# Patient Record
Sex: Male | Born: 1950 | Race: White | Hispanic: No | Smoking: Current some day smoker
Health system: Southern US, Community
[De-identification: ages and names within clinical notes are randomized; demographics above are authoritative.]

## PROBLEM LIST (undated history)

## (undated) DIAGNOSIS — E1165 Type 2 diabetes mellitus with hyperglycemia: Secondary | ICD-10-CM

## (undated) DIAGNOSIS — N183 Chronic kidney disease, stage 3 unspecified: Secondary | ICD-10-CM

## (undated) DIAGNOSIS — I350 Nonrheumatic aortic (valve) stenosis: Secondary | ICD-10-CM

## (undated) DIAGNOSIS — I10 Essential (primary) hypertension: Secondary | ICD-10-CM

## (undated) DIAGNOSIS — I509 Heart failure, unspecified: Secondary | ICD-10-CM

## (undated) DIAGNOSIS — I739 Peripheral vascular disease, unspecified: Secondary | ICD-10-CM

## (undated) DIAGNOSIS — IMO0002 Reserved for concepts with insufficient information to code with codable children: Secondary | ICD-10-CM

## (undated) DIAGNOSIS — I4891 Unspecified atrial fibrillation: Secondary | ICD-10-CM

## (undated) DIAGNOSIS — E119 Type 2 diabetes mellitus without complications: Secondary | ICD-10-CM

## (undated) DIAGNOSIS — G4730 Sleep apnea, unspecified: Secondary | ICD-10-CM

## (undated) DIAGNOSIS — K635 Polyp of colon: Secondary | ICD-10-CM

## (undated) HISTORY — DX: Morbid (severe) obesity due to excess calories: E66.01

## (undated) HISTORY — DX: Type 2 diabetes mellitus without complications: E11.9

## (undated) HISTORY — DX: Chronic kidney disease, stage 3 unspecified: N18.30

## (undated) HISTORY — DX: Polyp of colon: K63.5

## (undated) HISTORY — DX: Chronic kidney disease, stage 3 (moderate): N18.3

## (undated) HISTORY — DX: Essential (primary) hypertension: I10

## (undated) HISTORY — DX: Type 2 diabetes mellitus with hyperglycemia: E11.65

## (undated) HISTORY — DX: Reserved for concepts with insufficient information to code with codable children: IMO0002

## (undated) HISTORY — DX: Sleep apnea, unspecified: G47.30

## (undated) HISTORY — DX: Peripheral vascular disease, unspecified: I73.9

---

## 2010-08-02 ENCOUNTER — Emergency Department (HOSPITAL_COMMUNITY): Admission: EM | Admit: 2010-08-02 | Discharge: 2010-08-02 | Payer: Self-pay | Admitting: Emergency Medicine

## 2010-12-05 LAB — DIFFERENTIAL
Basophils Absolute: 0 10*3/uL (ref 0.0–0.1)
Basophils Relative: 0 % (ref 0–1)
Monocytes Relative: 10 % (ref 3–12)
Neutro Abs: 6.7 10*3/uL (ref 1.7–7.7)
Neutrophils Relative %: 62 % (ref 43–77)

## 2010-12-05 LAB — CBC
HCT: 49.2 % (ref 39.0–52.0)
MCHC: 33.7 g/dL (ref 30.0–36.0)
RDW: 13.3 % (ref 11.5–15.5)

## 2010-12-05 LAB — COMPREHENSIVE METABOLIC PANEL
Alkaline Phosphatase: 49 U/L (ref 39–117)
BUN: 8 mg/dL (ref 6–23)
Glucose, Bld: 92 mg/dL (ref 70–99)
Potassium: 4.2 mEq/L (ref 3.5–5.1)
Total Protein: 7.9 g/dL (ref 6.0–8.3)

## 2010-12-05 LAB — CK TOTAL AND CKMB (NOT AT ARMC)
Relative Index: INVALID (ref 0.0–2.5)
Total CK: 94 U/L (ref 7–232)

## 2010-12-05 LAB — BRAIN NATRIURETIC PEPTIDE: Pro B Natriuretic peptide (BNP): 54 pg/mL (ref 0.0–100.0)

## 2010-12-05 LAB — TROPONIN I: Troponin I: 0.01 ng/mL (ref 0.00–0.06)

## 2010-12-05 LAB — PROTIME-INR
INR: 0.88 (ref 0.00–1.49)
Prothrombin Time: 12.1 seconds (ref 11.6–15.2)

## 2013-01-08 ENCOUNTER — Ambulatory Visit (INDEPENDENT_AMBULATORY_CARE_PROVIDER_SITE_OTHER): Payer: BC Managed Care – PPO | Admitting: Family Medicine

## 2013-01-08 ENCOUNTER — Other Ambulatory Visit: Payer: Self-pay | Admitting: Family Medicine

## 2013-01-08 ENCOUNTER — Encounter: Payer: Self-pay | Admitting: Family Medicine

## 2013-01-08 VITALS — BP 136/78 | HR 86 | Temp 98.1°F | Resp 22 | Ht 73.5 in | Wt 352.0 lb

## 2013-01-08 DIAGNOSIS — G4710 Hypersomnia, unspecified: Secondary | ICD-10-CM

## 2013-01-08 DIAGNOSIS — R0989 Other specified symptoms and signs involving the circulatory and respiratory systems: Secondary | ICD-10-CM

## 2013-01-08 DIAGNOSIS — R609 Edema, unspecified: Secondary | ICD-10-CM

## 2013-01-08 LAB — CBC WITH DIFFERENTIAL/PLATELET
Eosinophils Absolute: 0.1 10*3/uL (ref 0.0–0.7)
Eosinophils Relative: 2 % (ref 0–5)
Hemoglobin: 16 g/dL (ref 13.0–17.0)
Lymphocytes Relative: 22 % (ref 12–46)
Lymphs Abs: 2 10*3/uL (ref 0.7–4.0)
MCH: 31.7 pg (ref 26.0–34.0)
MCV: 93.3 fL (ref 78.0–100.0)
Monocytes Relative: 8 % (ref 3–12)
RBC: 5.05 MIL/uL (ref 4.22–5.81)

## 2013-01-08 LAB — BASIC METABOLIC PANEL
Calcium: 9.3 mg/dL (ref 8.4–10.5)
Chloride: 99 mEq/L (ref 96–112)
Creat: 1.01 mg/dL (ref 0.50–1.35)

## 2013-01-08 LAB — HEPATIC FUNCTION PANEL
Bilirubin, Direct: 0.1 mg/dL (ref 0.0–0.3)
Indirect Bilirubin: 0.2 mg/dL (ref 0.0–0.9)
Total Bilirubin: 0.3 mg/dL (ref 0.3–1.2)

## 2013-01-08 LAB — TSH: TSH: 2.712 u[IU]/mL (ref 0.350–4.500)

## 2013-01-08 MED ORDER — FUROSEMIDE 40 MG PO TABS: 40.0000 mg | ORAL_TABLET | Freq: Every day | ORAL | Status: DC

## 2013-01-08 MED ORDER — POTASSIUM CHLORIDE CRYS ER 20 MEQ PO TBCR: 20.0000 meq | EXTENDED_RELEASE_TABLET | Freq: Every day | ORAL | Status: DC
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
## 2013-01-09 LAB — OTHER SOLSTAS TEST
ALT: 16 U/L (ref 0–53)
AST: 15 U/L (ref 0–37)
Alkaline Phosphatase: 50 U/L (ref 39–117)
Anti Nuclear Antibody(ANA): NEGATIVE
Basophils Absolute: 0 10*3/uL (ref 0.0–0.1)
Basophils Relative: 0 % (ref 0–1)
Creat: 0.91 mg/dL (ref 0.50–1.35)
Eosinophils Absolute: 0.2 10*3/uL (ref 0.0–0.7)
HCT: 47.5 % (ref 39.0–52.0)
MCH: 32.1 pg (ref 26.0–34.0)
MCHC: 33.9 g/dL (ref 30.0–36.0)
Monocytes Absolute: 0.7 10*3/uL (ref 0.1–1.0)
Neutro Abs: 6.2 10*3/uL (ref 1.7–7.7)
Potassium: 4.3 mEq/L (ref 3.5–5.3)
RDW: 14.3 % (ref 11.5–15.5)
Rhuematoid fact SerPl-aCnc: <10 IU/mL (ref <=14)
Total Bilirubin: 0.3 mg/dL (ref 0.3–1.2)
Total Protein: 6.7 g/dL (ref 6.0–8.3)
Uric Acid, Serum: 4.2 mg/dL (ref 4.0–7.8)

## 2013-01-12 ENCOUNTER — Encounter: Payer: Self-pay | Admitting: Family Medicine

## 2013-01-12 LAB — HEMOGLOBIN A1C: Mean Plasma Glucose: 189 mg/dL — ABNORMAL HIGH (ref <117)

## 2013-01-12 MED ORDER — SITAGLIPTIN PHOS-METFORMIN HCL 50-500 MG PO TABS: 1.0000 | ORAL_TABLET | Freq: Two times a day (BID) | ORAL | Status: DC

## 2013-01-12 NOTE — Addendum Note (Signed)
Addended by: Lynnea Ferrier T on: 01/12/2013 01:11 PM   Modules accepted: Orders

## 2013-01-14 ENCOUNTER — Ambulatory Visit (HOSPITAL_COMMUNITY)
Admission: RE | Admit: 2013-01-14 | Discharge: 2013-01-14 | Disposition: A | Discharge status: Home / Self Care | Payer: BC Managed Care – PPO | Source: Ambulatory Visit | Attending: Family Medicine | Admitting: Family Medicine

## 2013-01-14 DIAGNOSIS — R0602 Shortness of breath: Secondary | ICD-10-CM | POA: Insufficient documentation

## 2013-01-14 MED ORDER — ALBUTEROL SULFATE (5 MG/ML) 0.5% IN NEBU
2.5000 mg | INHALATION_SOLUTION | Freq: Once | RESPIRATORY_TRACT | Status: AC
  Administered 2013-01-14: 2.5 mg via RESPIRATORY_TRACT

## 2013-01-20 NOTE — Procedures (Signed)
NAMERIGGINS, CISEK                ACCOUNT NO.:  [id number]  MEDICAL RECORD NO.:  [id number]  LOCATION:  RESP                          FACILITY:  [facility]  PHYSICIAN:  Delyle Weider L. Juanetta Gosling, M.D.DATE OF BIRTH:  [birth year]  DATE OF PROCEDURE:  01/14/2013 DATE OF DISCHARGE:  01/14/2013                           PULMONARY FUNCTION TEST   REASON FOR PULMONARY FUNCTION TESTING:  Shortness of breath.  RESULTS: 1. Spirometry shows a moderate ventilatory defect without definite     airflow obstruction. 2. Lung volumes show air trapping. 3. Diffusing capacity is normal. 4. Airway resistance is slightly elevated, confirming the presence of     airflow obstruction. 5. There is no significant bronchodilator improvement. 6. Noting the patient's height and weight, some of the problems may be     related to body habitus.     Deiondre Harrower L. Juanetta Gosling, M.D.     [facility]/MEDQ  D:  01/19/2013  T:  01/20/2013  Job:  [id number]

## 2013-01-29 LAB — PULMONARY FUNCTION TEST

## 2013-02-18 ENCOUNTER — Ambulatory Visit (HOSPITAL_BASED_OUTPATIENT_CLINIC_OR_DEPARTMENT_OTHER): Payer: BC Managed Care – PPO | Attending: Cardiology | Admitting: Radiology

## 2013-02-18 VITALS — Ht 73.0 in | Wt 327.0 lb

## 2013-02-18 DIAGNOSIS — R0609 Other forms of dyspnea: Secondary | ICD-10-CM | POA: Insufficient documentation

## 2013-02-18 DIAGNOSIS — E119 Type 2 diabetes mellitus without complications: Secondary | ICD-10-CM | POA: Insufficient documentation

## 2013-02-18 DIAGNOSIS — R0989 Other specified symptoms and signs involving the circulatory and respiratory systems: Secondary | ICD-10-CM | POA: Insufficient documentation

## 2013-02-18 DIAGNOSIS — G4733 Obstructive sleep apnea (adult) (pediatric): Secondary | ICD-10-CM | POA: Insufficient documentation

## 2013-02-24 ENCOUNTER — Institutional Professional Consult (permissible substitution): Payer: BC Managed Care – HMO | Admitting: Pulmonary Disease

## 2013-03-02 ENCOUNTER — Encounter: Payer: Self-pay | Admitting: Family Medicine

## 2013-03-02 DIAGNOSIS — G4730 Sleep apnea, unspecified: Secondary | ICD-10-CM | POA: Insufficient documentation

## 2013-03-02 DIAGNOSIS — E1165 Type 2 diabetes mellitus with hyperglycemia: Secondary | ICD-10-CM | POA: Insufficient documentation

## 2013-03-02 DIAGNOSIS — G4733 Obstructive sleep apnea (adult) (pediatric): Secondary | ICD-10-CM

## 2013-03-02 DIAGNOSIS — E119 Type 2 diabetes mellitus without complications: Secondary | ICD-10-CM | POA: Insufficient documentation

## 2013-03-02 NOTE — Procedures (Signed)
NAMELUIGI, STUCKEY                ACCOUNT NO.:  [id number]  MEDICAL RECORD NO.:  [id number]          PATIENT TYPE:  OUT  LOCATION:  SLEEP CENTER                 FACILITY[facility]  [facility]  PHYSICIAN:  Barbaraann Share, MD,FCCPDATE OF BIRTH:  11-21[birth year]1950  DATE OF STUDY:  02/18/2013                           NOCTURNAL POLYSOMNOGRAM  REFERRING PHYSICIAN:  Pamella Pert, MD  REFERRING PHYSICIAN:  Pamella Pert, MD  LOCATION:  Sleep Lab.  INDICATION FOR STUDY:  Hypersomnia with sleep apnea.  EPWORTH SLEEPINESS SCORE:  20.  SLEEP ARCHITECTURE:  The patient had a total sleep time of 331 minutes with decreased REM noted and no slow wave sleep.  Sleep onset latency was normal, and REM onset was delayed until the titration portion of the study.  Sleep efficiency was 80% during the diagnostic portion study and 85% during the titration portion.  RESPIRATORY DATA:  The patient was found to have 201 obstructive events in the first 123 minutes of sleep, giving him an apnea/hypopnea index of 98 events per hour during the diagnostic portion of the study.  The events occur primarily in the supine position and there was loud snoring noted throughout.  For split night protocol, he was then fitted with a medium ResMed Mirage Quattro full-face mask, and titrated up to 16 cm of water pressure.  At this level, he did have some mild breakthrough events, but it should be noted that he did not achieve supine sleep on the final setting.  OXYGEN DATA:  There was O2 desaturation as low as 68% during the diagnostic portion of the study.  Oxygen was added at 2 L per Sleep Center protocol.  CARDIAC DATA:  Occasional PAC noted, but no clinically significant arrhythmias were seen.  MOVEMENT/PARASOMNIA:  The patient had small numbers of limb movements without significant sleep disruption.  There were no abnormal behaviors seen.  IMPRESSION/RECOMMENDATION: 1. Very severe obstructive sleep apnea/hypopnea  syndrome, with an AHI     of 98 events per hour and oxygen desaturation as low as 68% during     the diagnostic portion of the study.  The patient was then fitted     with a medium ResMed Mirage Quattro full-face mask, and titrated up     to 16 cm of water pressure.  It should be noted the patient did not     achieve supine sleep on the final setting, and did have some     breakthrough events in the nonsupine position.  Clinical     correlation is suggested.  The patient may need fine tuning of his     optimal pressure as an outpatient on an auto titrating device.  He     should also be encouraged to work aggressively on weight loss. 2. Severe desaturation as low as 68%, triggering the Sleep Center     protocol for starting 2 L of nasal oxygen.  The patient will need     overnight oximetry on his optimal CPAP pressure to reassess his     need for supplemental oxygen. 3. Rare premature atrial contraction noted, but no clinically     significant arrhythmias were seen.  Barbaraann Share, MD,FCCP Diplomate, American Board of Sleep Medicine    [facility]/MEDQ  D:  03/02/2013 08:01:45  T:  03/02/2013 08:35:57  Job:  [id number]

## 2013-03-03 ENCOUNTER — Encounter: Payer: Self-pay | Admitting: Internal Medicine

## 2013-03-03 ENCOUNTER — Ambulatory Visit (INDEPENDENT_AMBULATORY_CARE_PROVIDER_SITE_OTHER): Payer: BC Managed Care – PPO | Admitting: Internal Medicine

## 2013-03-03 ENCOUNTER — Ambulatory Visit (INDEPENDENT_AMBULATORY_CARE_PROVIDER_SITE_OTHER)
Admission: RE | Admit: 2013-03-03 | Discharge: 2013-03-03 | Disposition: A | Discharge status: Home / Self Care | Payer: BC Managed Care – PPO | Source: Ambulatory Visit | Attending: Internal Medicine | Admitting: Internal Medicine

## 2013-03-03 VITALS — BP 112/58 | HR 80 | Temp 98.0°F | Ht 73.0 in | Wt 344.0 lb

## 2013-03-03 DIAGNOSIS — I10 Essential (primary) hypertension: Secondary | ICD-10-CM

## 2013-03-03 DIAGNOSIS — F17200 Nicotine dependence, unspecified, uncomplicated: Secondary | ICD-10-CM

## 2013-03-03 DIAGNOSIS — J449 Chronic obstructive pulmonary disease, unspecified: Secondary | ICD-10-CM

## 2013-03-03 MED ORDER — TELMISARTAN-HCTZ 80-12.5 MG PO TABS: ORAL_TABLET | ORAL | Status: DC

## 2013-03-03 NOTE — Progress Notes (Signed)
  Subjective:    Patient ID: Steven Henry, male    DOB: 06/27/1951, MRN: [id number]  HPI   69 yowm active smoker referred by Dr Nadara Eaton for eval of sob and cough  03/03/2013 1st pulmonary eval cc doe x several years x indolent onset to point where problem walking across a parking lot better p rx with "a pill" by Dr Charleston Poot and now more limited by calf pain than sob but now bothered by cough on ACEi since 12/2012 indolent x 2 weeks, dry, day > night  No obvious daytime variabilty or assoc cp or chest tightness, subjective wheeze overt sinus or hb symptoms. No unusual exp hx or h/o childhood pna/ asthma or knowledge of premature birth.   Sleeping ok without nocturnal  or early am exacerbation  of respiratory  c/o's or need for noct saba. Also denies any obvious fluctuation of symptoms with weather or environmental changes or other aggravating or alleviating factors except as outlined above     Review of Systems  Constitutional: Negative for fever, chills, activity change, appetite change and unexpected weight change.  HENT: Positive for sneezing. Negative for congestion, sore throat, rhinorrhea, trouble swallowing, dental problem, voice change and postnasal drip.   Eyes: Negative for visual disturbance.  Respiratory: Positive for cough and shortness of breath. Negative for choking.   Cardiovascular: Negative for chest pain and leg swelling.  Gastrointestinal: Negative for nausea, vomiting and abdominal pain.  Genitourinary: Negative for difficulty urinating.  Musculoskeletal: Positive for arthralgias.  Skin: Negative for rash.  Psychiatric/Behavioral: Negative for behavioral problems and confusion.       Objective:   Physical Exam  Wt Readings from Last 3 Encounters:  03/03/13 344 lb (156.037 kg)  02/18/13 327 lb (148.326 kg)  01/08/13 352 lb (159.666 kg)     HEENT mild turbinate edema.  Oropharynx no thrush or excess pnd or cobblestoning.  No JVD or cervical adenopathy. Mild accessory  muscle hypertrophy. Trachea midline, nl thryroid. Chest was hyperinflated by percussion with diminished breath sounds and moderate increased exp time without wheeze. Hoover sign positive at mid inspiration. Regular rate and rhythm without murmur gallop or rub or increase P2 or edema.  Abd: no hsm, nl excursion. Ext warm without cyanosis or clubbing.     cxr 03/03/13  *RADIOLOGY REPORT*  Clinical Data: Cough. Shortness of breath. Chest pain. Smoker.  Hypertension.  CHEST - 2 VIEW  Comparison: 08/02/2010 the  Findings: Heart size is normal. Central peribronchial thickening  and interstitial prominence is stable. No evidence of acute  infiltrate or pulmonary edema. No evidence of pleural effusion.  No mass or lymphadenopathy identified.  IMPRESSION:  Stable exam. No active disease.       Assessment & Plan:

## 2013-03-03 NOTE — Patient Instructions (Addendum)
Stop lisinopril (zestril, prinivil) due to cough   Start micardis 80/12.5  One half daily and let your doctor know this when you get your blood work called in  The key is to stop smoking completely before smoking completely stops you - it's not too late!  Please remember to go to   x-ray department downstairs for your tests - we will call you with the results when they are available.    Please schedule a follow up office visit in 6 weeks, call sooner if needed with pft's

## 2013-03-04 ENCOUNTER — Telehealth: Payer: Self-pay | Admitting: Internal Medicine

## 2013-03-04 NOTE — Telephone Encounter (Signed)
I spoke with patient about results and he verbalized understanding and had no questions 

## 2013-03-04 NOTE — Telephone Encounter (Signed)
Notes Recorded by Nyoka Cowden, MD on 03/03/2013 at 5:22 PM Call pt: Reviewed cxr and no acute change so no change in recommendations made at ov ----  lmomtcb x1

## 2013-03-04 NOTE — Telephone Encounter (Signed)
Returning call can be reached at [phone].Steven Henry

## 2013-03-05 DIAGNOSIS — F17210 Nicotine dependence, cigarettes, uncomplicated: Secondary | ICD-10-CM | POA: Insufficient documentation

## 2013-03-05 DIAGNOSIS — J449 Chronic obstructive pulmonary disease, unspecified: Secondary | ICD-10-CM | POA: Insufficient documentation

## 2013-03-05 DIAGNOSIS — F17200 Nicotine dependence, unspecified, uncomplicated: Secondary | ICD-10-CM | POA: Insufficient documentation

## 2013-03-05 DIAGNOSIS — I10 Essential (primary) hypertension: Secondary | ICD-10-CM | POA: Insufficient documentation

## 2013-03-05 DIAGNOSIS — J441 Chronic obstructive pulmonary disease with (acute) exacerbation: Secondary | ICD-10-CM | POA: Insufficient documentation

## 2013-03-05 NOTE — Assessment & Plan Note (Addendum)
ACE inhibitors are problematic in  pts with airway complaints because  even experienced pulmonologists can't always distinguish ace effects from copd/asthma.  By themselves they don't actually cause a problem, much like oxygen can't by itself start a fire, but they certainly serve as a powerful catalyst or enhancer for any "fire"  or inflammatory process in the upper airway, be it caused by an ET  tube or more commonly reflux (especially in the obese or pts with known GERD or who are on biphoshonates).    In the era of ARB near equivalency (at least  in short term) until we have a better handle on the reversibility of the airway problem, it just makes sense to avoid ACEI  entirely in the short run and then decide later, having established a level of airway control using a reasonable limited regimen, whether to add back ace but even then being very careful to observe the pt for worsening airway control and number of meds used/ needed to control symptoms.

## 2013-03-05 NOTE — Assessment & Plan Note (Signed)

## 2013-03-05 NOTE — Assessment & Plan Note (Signed)
Relatively mild but still smoking(discussed separately), needs pft's and trial off acei to see if his one resp complaint at this point, cough, improves  No need for copd meds at this point

## 2013-03-05 NOTE — Assessment & Plan Note (Addendum)
ACE inhibitors are problematic in  pts with airway complaints because  even experienced pulmonologists can't always distinguish ace effects from copd/asthma.  By themselves they don't actually cause a problem, much like oxygen can't by itself start a fire, but they certainly serve as a powerful catalyst or enhancer for any "fire"  or inflammatory process in the upper airway, be it caused by an ET  tube or more commonly reflux (especially in the obese or pts with known GERD or who are on biphoshonates).    In the era of ARB near equivalency (at least in short run) until we have a better handle on the reversibility of the airway problem, it just makes sense to avoid ACEI  entirely in the short run and then decide later, having established a level of airway control using a reasonable limited regimen, whether to add back ace but even then being very careful to observe the pt for worsening airway control and number of meds used/ needed to control symptoms. ]  For now try micardis 80/12.5 one half daily x 6 weeks then regroup with pfts

## 2013-03-13 ENCOUNTER — Encounter: Payer: Self-pay | Admitting: Family Medicine

## 2013-03-13 ENCOUNTER — Ambulatory Visit (INDEPENDENT_AMBULATORY_CARE_PROVIDER_SITE_OTHER): Payer: BC Managed Care – PPO | Admitting: Family Medicine

## 2013-03-13 VITALS — BP 100/64 | HR 74 | Temp 98.3°F | Resp 20 | Wt 336.0 lb

## 2013-03-13 DIAGNOSIS — G4733 Obstructive sleep apnea (adult) (pediatric): Secondary | ICD-10-CM

## 2013-03-13 DIAGNOSIS — IMO0001 Reserved for inherently not codable concepts without codable children: Secondary | ICD-10-CM

## 2013-03-13 DIAGNOSIS — F17200 Nicotine dependence, unspecified, uncomplicated: Secondary | ICD-10-CM

## 2013-03-13 MED ORDER — METFORMIN HCL 1000 MG PO TABS: 1000.0000 mg | ORAL_TABLET | Freq: Two times a day (BID) | ORAL | Status: DC
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
## 2013-04-20 ENCOUNTER — Ambulatory Visit (INDEPENDENT_AMBULATORY_CARE_PROVIDER_SITE_OTHER): Payer: BC Managed Care – PPO | Admitting: Internal Medicine

## 2013-04-20 ENCOUNTER — Encounter: Payer: Self-pay | Admitting: Internal Medicine

## 2013-04-20 VITALS — BP 112/70 | HR 65 | Temp 97.6°F | Ht 72.25 in | Wt 342.0 lb

## 2013-04-20 DIAGNOSIS — J449 Chronic obstructive pulmonary disease, unspecified: Secondary | ICD-10-CM

## 2013-04-20 DIAGNOSIS — F17200 Nicotine dependence, unspecified, uncomplicated: Secondary | ICD-10-CM

## 2013-04-20 DIAGNOSIS — I10 Essential (primary) hypertension: Secondary | ICD-10-CM

## 2013-04-20 LAB — PULMONARY FUNCTION TEST

## 2013-04-20 MED ORDER — TELMISARTAN-HCTZ 80-12.5 MG PO TABS: 0.5000 | ORAL_TABLET | Freq: Every day | ORAL | Status: DC

## 2013-04-20 NOTE — Progress Notes (Signed)
Subjective:    Patient ID: Steven Henry, male    DOB: 1950-10-07, MRN: [id number]  HPI   66 yowm active smoker referred by Dr Nadara Eaton for eval of sob and cough  03/03/2013 1st pulmonary eval cc doe x several years x indolent onset to point where problem walking across a parking lot better p rx with "a pill" by Dr Charleston Poot and now more limited by calf pain than sob but now bothered by cough on ACEi since 12/2012 indolent x 2 weeks, dry, day > night rec Stop lisinopril (zestril, prinivil) due to cough  Start micardis 80/12.5  One half daily and let your doctor know this when you get your blood work called in The key is to stop smoking completely before smoking completely stops you - it's not too late!   04/20/2013 f/u ov/Steven Henry re GOLD 0 COPD Chief Complaint  Patient presents with  . Follow-up with PFT    Pt states that he feels breathing has improved some since his last visit. Cough has resolved.    doe much better,  walking mall is good, hills are a problem or if he gets in a real hurry.   No obvious daytime variabilty or assoc cough cp or chest tightness, subjective wheeze overt sinus or hb symptoms. No unusual exp hx or h/o childhood pna/ asthma or knowledge of premature birth.   Sleeping ok without nocturnal  or early am exacerbation  of respiratory  c/o's or need for noct saba. Also denies any obvious fluctuation of symptoms with weather or environmental changes or other aggravating or alleviating factors except as outlined above   Current Medications, Allergies, Past Medical History, Past Surgical History, Family History, and Social History were reviewed in [employer] record.  ROS  The following are not active complaints unless bolded sore throat, dysphagia, dental problems, itching, sneezing,  nasal congestion or excess/ purulent secretions, ear ache,   fever, chills, sweats, unintended wt loss, pleuritic or exertional cp, hemoptysis,  orthopnea pnd or leg  swelling, presyncope, palpitations, heartburn, abdominal pain, anorexia, nausea, vomiting, diarrhea  or change in bowel or urinary habits, change in stools or urine, dysuria,hematuria,  rash, arthralgias, visual complaints, headache, numbness weakness or ataxia or problems with walking or coordination,  change in mood/affect or memory.              Objective:   Physical Exam  04/20/2013        342  Wt Readings from Last 3 Encounters:  03/03/13 344 lb (156.037 kg)  02/18/13 327 lb (148.326 kg)  01/08/13 352 lb (159.666 kg)     HEENT mild turbinate edema.  Oropharynx no thrush or excess pnd or cobblestoning.  No JVD or cervical adenopathy. Mild accessory muscle hypertrophy. Trachea midline, nl thryroid. Chest was hyperinflated by percussion with diminished breath sounds and moderate increased exp time without wheeze. Hoover sign positive at mid inspiration. Regular rate and rhythm without murmur gallop or rub or increase P2 or edema.  Abd: no hsm, nl excursion. Ext warm without cyanosis or clubbing.     cxr 03/03/13  *RADIOLOGY REPORT*  Clinical Data: Cough. Shortness of breath. Chest pain. Smoker.  Hypertension.  CHEST - 2 VIEW  Comparison: 08/02/2010 the  Findings: Heart size is normal. Central peribronchial thickening  and interstitial prominence is stable. No evidence of acute  infiltrate or pulmonary edema. No evidence of pleural effusion.  No mass or lymphadenopathy identified.  IMPRESSION:  Stable exam. No active disease.  Assessment & Plan:   Outpatient Encounter Prescriptions as of 04/20/2013  Medication Sig Dispense Refill  . atorvastatin (LIPITOR) 20 MG tablet Take 20 mg by mouth daily.      . metFORMIN (GLUCOPHAGE) 1000 MG tablet Take 1 tablet (1,000 mg total) by mouth 2 (two) times daily with a meal.  180 tablet  3  . telmisartan-hydrochlorothiazide (MICARDIS HCT) 80-12.5 MG per tablet Take 0.5 tablets by mouth daily.  15 tablet  11  . [DISCONTINUED]  telmisartan-hydrochlorothiazide (MICARDIS HCT) 80-12.5 MG per tablet Take 0.5 tablets by mouth daily.      Marland Kitchen spironolactone (ALDACTONE) 50 MG tablet Take 50 mg by mouth daily.      . [DISCONTINUED] lisinopril (PRINIVIL,ZESTRIL) 5 MG tablet Take 5 mg by mouth daily.       No facility-administered encounter medications on file as of 04/20/2013.

## 2013-04-20 NOTE — Progress Notes (Signed)
PFT done today. 

## 2013-04-20 NOTE — Patient Instructions (Addendum)
Continue micardis 80 /12.5 one half daily but call if your insurance prefers a different medication in the same category (ARB)    If you are satisfied with your treatment plan let your doctor know and he/she can either refill your medications or you can return here when your prescription runs out.     If in any way you are not 100% satisfied,  please tell us.  If 100% better, tell your friends!  

## 2013-04-21 NOTE — Assessment & Plan Note (Addendum)
-   PFTs 04/20/2013  FEV1 2.45 (63%) ratio 73 with no better p B2, DLCO 75% corrects to 95%  No evidence of sign copd, no need for rx x stop smoking discussed separately   What doe remains is clearly related to obesity/ deconditioning.   See instructions for specific recommendations which were reviewed directly with the patient who was given a copy with highlighter outlining the key components.

## 2013-04-21 NOTE — Assessment & Plan Note (Signed)
>   3 min  I reviewed the Flethcher curve with patient that basically indicates  if you quit smoking when your best day FEV1 is still well preserved (as his clearly is)  it is highly unlikely you will progress to severe disease and informed the patient there was no medication on the market that has proven to change the curve or the likelihood of progression.  Therefore stopping smoking and maintaining abstinence is the most important aspect of care, not choice of inhalers or for that matter, doctors.

## 2013-04-21 NOTE — Assessment & Plan Note (Addendum)
off acei effective 03/04/13> much better 04/20/13   Would not rechallenge him with ACEi  given convincing benefits off this class of medication  Defer all refills/ choice  Of  ARBs to primary care depending on his insurance formulary restrictions.  Note use of aldactone with arb risks elevated k but no more so than ACEi and he says this was checked p arb started but will of course need to be monitored by his primary care doctors.

## 2013-05-05 ENCOUNTER — Encounter: Payer: Self-pay | Admitting: Internal Medicine

## 2013-06-14 ENCOUNTER — Emergency Department (HOSPITAL_COMMUNITY): Payer: BC Managed Care – PPO

## 2013-06-14 ENCOUNTER — Encounter (HOSPITAL_COMMUNITY): Payer: Self-pay | Admitting: *Deleted

## 2013-06-14 ENCOUNTER — Emergency Department (HOSPITAL_COMMUNITY)
Admission: EM | Admit: 2013-06-14 | Discharge: 2013-06-14 | Disposition: A | Discharge status: Home / Self Care | Payer: BC Managed Care – PPO | Attending: Emergency Medicine | Admitting: Emergency Medicine

## 2013-06-14 DIAGNOSIS — F17200 Nicotine dependence, unspecified, uncomplicated: Secondary | ICD-10-CM | POA: Insufficient documentation

## 2013-06-14 DIAGNOSIS — K5900 Constipation, unspecified: Secondary | ICD-10-CM | POA: Insufficient documentation

## 2013-06-14 DIAGNOSIS — E119 Type 2 diabetes mellitus without complications: Secondary | ICD-10-CM | POA: Insufficient documentation

## 2013-06-14 DIAGNOSIS — N200 Calculus of kidney: Secondary | ICD-10-CM

## 2013-06-14 DIAGNOSIS — Z79899 Other long term (current) drug therapy: Secondary | ICD-10-CM | POA: Insufficient documentation

## 2013-06-14 LAB — CBC WITH DIFFERENTIAL/PLATELET
Basophils Absolute: 0 10*3/uL (ref 0.0–0.1)
Basophils Relative: 0 % (ref 0–1)
Eosinophils Absolute: 0 10*3/uL (ref 0.0–0.7)
Hemoglobin: 15.4 g/dL (ref 13.0–17.0)
Lymphs Abs: 1.6 10*3/uL (ref 0.7–4.0)
MCH: 32.6 pg (ref 26.0–34.0)
MCHC: 33.8 g/dL (ref 30.0–36.0)
Monocytes Relative: 10 % (ref 3–12)
Neutro Abs: 13.2 10*3/uL — ABNORMAL HIGH (ref 1.7–7.7)
Neutrophils Relative %: 80 % — ABNORMAL HIGH (ref 43–77)
Platelets: 236 10*3/uL (ref 150–400)
RDW: 13.5 % (ref 11.5–15.5)

## 2013-06-14 LAB — COMPREHENSIVE METABOLIC PANEL
ALT: 15 U/L (ref 0–53)
AST: 24 U/L (ref 0–37)
Albumin: 3.8 g/dL (ref 3.5–5.2)
Alkaline Phosphatase: 53 U/L (ref 39–117)
BUN: 15 mg/dL (ref 6–23)
Chloride: 91 mEq/L — ABNORMAL LOW (ref 96–112)
Glucose, Bld: 150 mg/dL — ABNORMAL HIGH (ref 70–99)
Potassium: 3.8 mEq/L (ref 3.5–5.1)
Total Protein: 7.7 g/dL (ref 6.0–8.3)

## 2013-06-14 LAB — URINALYSIS, ROUTINE W REFLEX MICROSCOPIC
Bilirubin Urine: NEGATIVE
Ketones, ur: NEGATIVE mg/dL
Leukocytes, UA: NEGATIVE
Nitrite: NEGATIVE
Urobilinogen, UA: 0.2 mg/dL (ref 0.0–1.0)
pH: 7 (ref 5.0–8.0)

## 2013-06-14 LAB — URINE MICROSCOPIC-ADD ON

## 2013-06-14 MED ORDER — TAMSULOSIN HCL 0.4 MG PO CAPS: 0.4000 mg | ORAL_CAPSULE | Freq: Every day | ORAL | Status: DC

## 2013-06-14 MED ORDER — PROMETHAZINE HCL 25 MG PO TABS: 25.0000 mg | ORAL_TABLET | Freq: Four times a day (QID) | ORAL | Status: DC | PRN

## 2013-06-14 MED ORDER — ONDANSETRON HCL 4 MG/2ML IJ SOLN
4.0000 mg | Freq: Once | INTRAMUSCULAR | Status: AC
  Administered 2013-06-14: 4 mg via INTRAVENOUS
  Filled 2013-06-14: qty 2

## 2013-06-14 MED ORDER — OXYCODONE-ACETAMINOPHEN 5-325 MG PO TABS: 1.0000 | ORAL_TABLET | Freq: Four times a day (QID) | ORAL | Status: DC | PRN

## 2013-06-14 MED ORDER — HYDROMORPHONE HCL PF 1 MG/ML IJ SOLN
1.0000 mg | Freq: Once | INTRAMUSCULAR | Status: AC
  Administered 2013-06-14: 1 mg via INTRAVENOUS
  Filled 2013-06-14: qty 1

## 2013-06-14 NOTE — ED Notes (Signed)
Removed pt's 02 and pt's 02 sat remained 93%.  Pt alert, oriented.  EDP aware. Ok to d/c per Dr. Estell Harpin.

## 2013-06-14 NOTE — ED Provider Notes (Signed)
CSN: [id number]     Arrival date & time 06/14/13  1407 History   This chart was scribed for Benny Lennert, MD, by Yevette Edwards, ED Scribe. This patient was seen in room APA10/[facility]10 and the patient's care was started at 3:50 PM. First MD Initiated Contact with Patient 06/14/13 1548     Chief Complaint  Patient presents with  . Back Pain    Patient is a 62 y.o. male presenting with flank pain. The history is provided by the patient and a friend. No language interpreter was used.  Flank Pain This is a new problem. The current episode started yesterday. The problem occurs constantly. The problem has been gradually worsening. Associated symptoms include abdominal pain. Nothing aggravates the symptoms. Nothing relieves the symptoms. He has tried nothing for the symptoms.   HPI Comments: Steven Henry is a 62 y.o. male, with a h/o DM, who presents to the Emergency Department complaining of sudden-onset, constant, and worsening pain to his left flank which began yesterday morning and which radiates to his lower left abdomen. He rates his pain as 10/10. The pt denies a h/o kidney calculi.   Past Medical History  Diagnosis Date  . Sleep apnea     not on CPAP   . Diabetes mellitus type II, uncontrolled   . Morbid obesity    History reviewed. No pertinent past surgical history. Family History  Problem Relation Age of Onset  . Colon cancer Mother   . Diabetes Mother   . Diabetes Sister   . Diabetes Sister   . Diabetes Sister   . Heart disease Mother    History  Substance Use Topics  . Smoking status: Current Every Day Smoker -- 0.50 packs/day for 30 years    Types: Cigarettes  . Smokeless tobacco: Never Used  . Alcohol Use: Yes     Comment: occasional    Review of Systems  Constitutional: Negative for chills.  Gastrointestinal: Positive for abdominal pain and constipation.  Genitourinary: Positive for flank pain.  All other systems reviewed and are negative.    Allergies   Review of patient's allergies indicates no known allergies.  Home Medications   Current Outpatient Rx  Name  Route  Sig  Dispense  Refill  . atorvastatin (LIPITOR) 20 MG tablet   Oral   Take 20 mg by mouth daily.         Marland Kitchen ibuprofen (ADVIL,MOTRIN) 200 MG tablet   Oral   Take 800 mg by mouth every 6 (six) hours as needed for pain.         . metFORMIN (GLUCOPHAGE) 1000 MG tablet   Oral   Take 1 tablet (1,000 mg total) by mouth 2 (two) times daily with a meal.   180 tablet   3   . spironolactone (ALDACTONE) 50 MG tablet   Oral   Take 50 mg by mouth daily.         Marland Kitchen telmisartan-hydrochlorothiazide (MICARDIS HCT) 80-12.5 MG per tablet   Oral   Take 0.5 tablets by mouth daily.   15 tablet   11    Triage Vitals: BP 130/78  Pulse 84  Temp(Src) 98 F (36.7 C) (Oral)  Resp 22  Ht 6\' 1"  (1.854 m)  Wt 342 lb (155.13 kg)  BMI 45.13 kg/m2  SpO2 95%  Physical Exam  Constitutional: He is oriented to person, place, and time. He appears well-developed.  HENT:  Head: Normocephalic.  Eyes: Conjunctivae and EOM are normal. No scleral icterus.  Neck: Neck supple. No thyromegaly present.  Cardiovascular: Normal rate and regular rhythm.  Exam reveals no gallop and no friction rub.   No murmur heard. Pulmonary/Chest: No stridor. He has no wheezes. He has no rales. He exhibits no tenderness.  Abdominal: He exhibits no distension. There is tenderness. There is no rebound.  Moderate to severe left flank tenderness.   Musculoskeletal: Normal range of motion. He exhibits no edema.  Lymphadenopathy:    He has no cervical adenopathy.  Neurological: He is oriented to person, place, and time. Coordination normal.  Skin: No rash noted. No erythema.  Psychiatric: He has a normal mood and affect. His behavior is normal.    ED Course  Procedures (including critical care time)  DIAGNOSTIC STUDIES: Oxygen Saturation is 95% on room air, adeqaute by my interpretation.    COORDINATION  OF CARE:  3:55 PM- Discussed treatment plan with patient, and the patient agreed to the plan.   Labs Review Labs Reviewed  URINALYSIS, ROUTINE W REFLEX MICROSCOPIC   Imaging Review No results found.  MDM  No diagnosis found.   Benny Lennert, MD 06/14/13 [phone]

## 2013-06-14 NOTE — ED Notes (Signed)
Pt sleeping and 02 sat decreased to 88%.  Woke pt up and placed pt on 2 liters via nasal cannula.  Sat increased to 93%.   Primary RN notified.

## 2013-06-14 NOTE — ED Notes (Signed)
Pt c/o left lower abd pian that started yesterday am, constipation for the past day or two, did have a bowel movement yesterday that was "small" per pt.

## 2013-11-14 ENCOUNTER — Other Ambulatory Visit: Payer: Self-pay | Admitting: Family Medicine

## 2013-12-13 ENCOUNTER — Other Ambulatory Visit: Payer: Self-pay | Admitting: Family Medicine

## 2013-12-14 ENCOUNTER — Encounter: Payer: Self-pay | Admitting: Family Medicine

## 2013-12-14 NOTE — Telephone Encounter (Signed)
Medication refill for one time only.  Patient needs to be seen.  Letter sent for patient to call and schedule 

## 2014-01-18 ENCOUNTER — Other Ambulatory Visit: Payer: Self-pay | Admitting: Family Medicine

## 2014-01-19 ENCOUNTER — Other Ambulatory Visit: Payer: Self-pay | Admitting: Family Medicine

## 2014-01-19 ENCOUNTER — Encounter: Payer: Self-pay | Admitting: Family Medicine

## 2014-01-19 NOTE — Telephone Encounter (Signed)
Will send letter to make appt 

## 2014-01-19 NOTE — Telephone Encounter (Signed)
Will send another letter for pt to schedule appt.

## 2014-02-17 ENCOUNTER — Other Ambulatory Visit: Payer: Self-pay | Admitting: Family Medicine

## 2014-02-18 NOTE — Telephone Encounter (Signed)
Medication filled x1 with no refills.   Requires office visit before any further refills can be given.   Letter sent.  

## 2014-04-01 IMAGING — CR DG CHEST 2V
2 series · 2 of 2 positions shown · non-contrast
Comparison: 08/02/2010 the

CLINICAL DATA: Cough.  Shortness of breath.  Chest pain.  Smoker.
Hypertension.

CHEST - 2 VIEW

[view not recorded (1 of 2)]
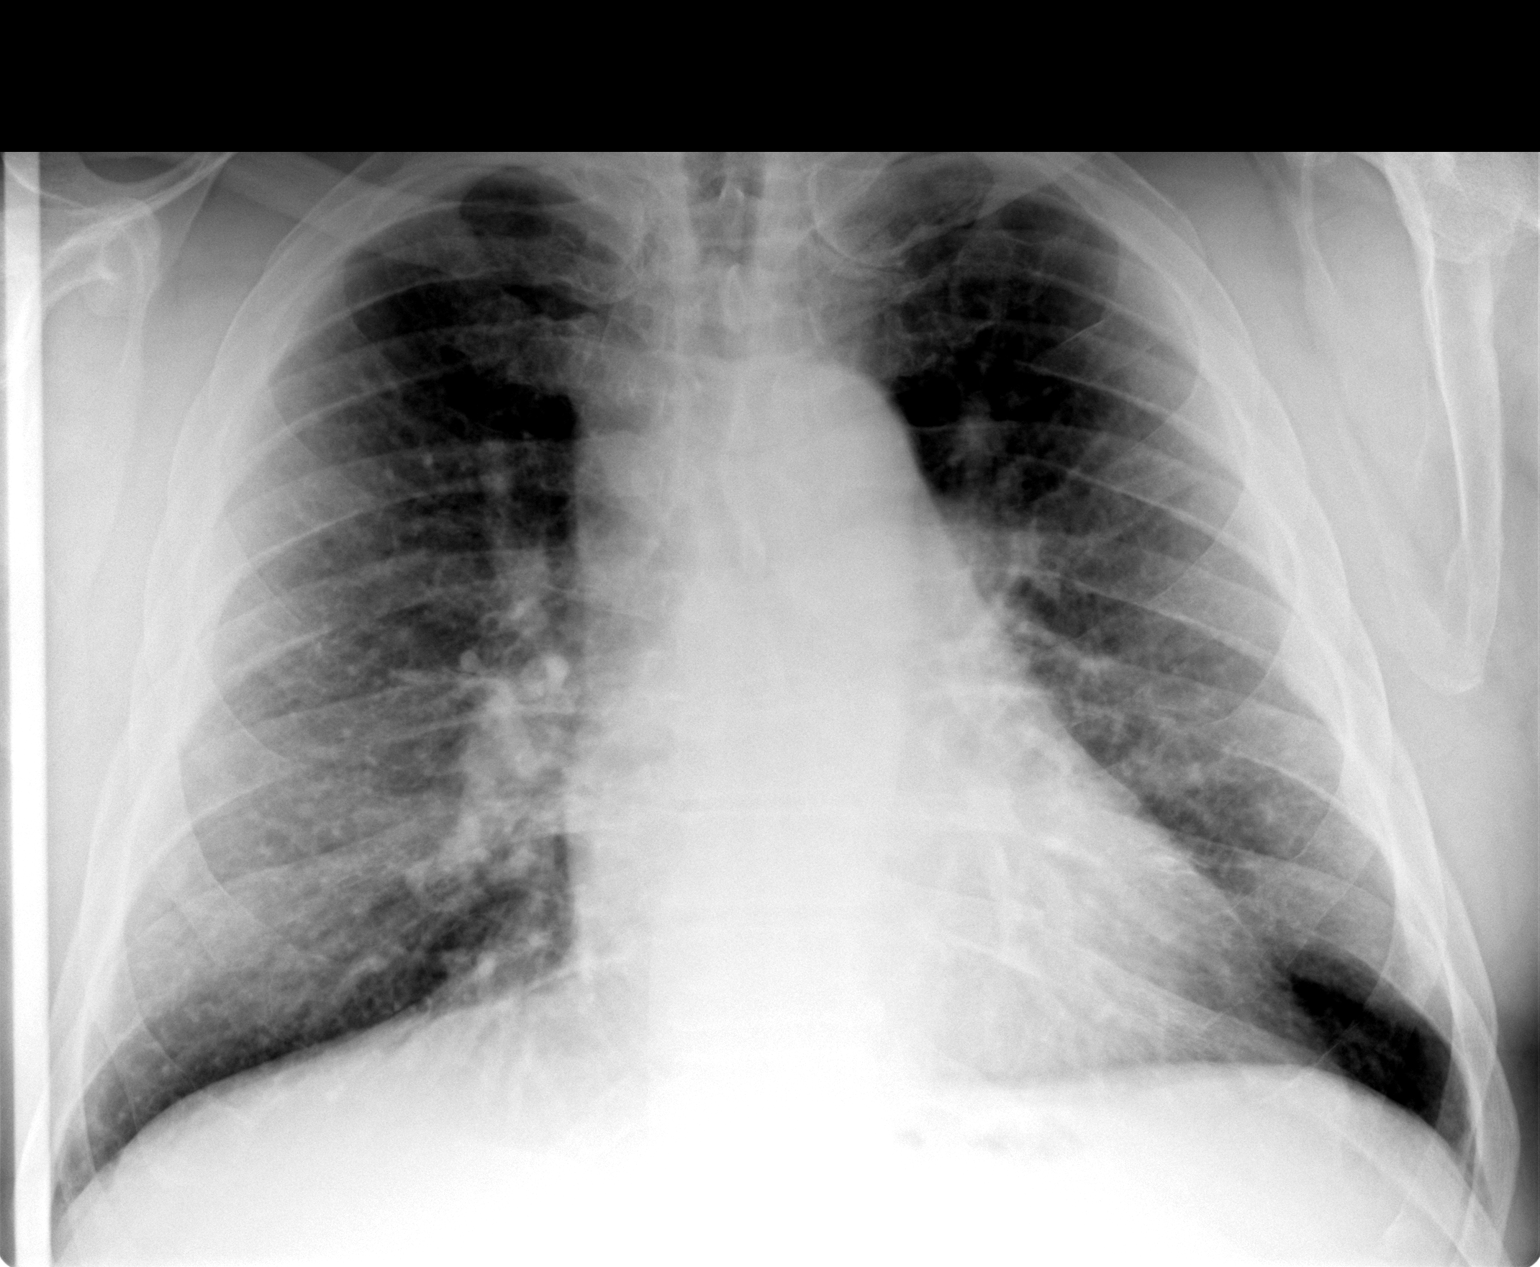

[view not recorded (2 of 2)]
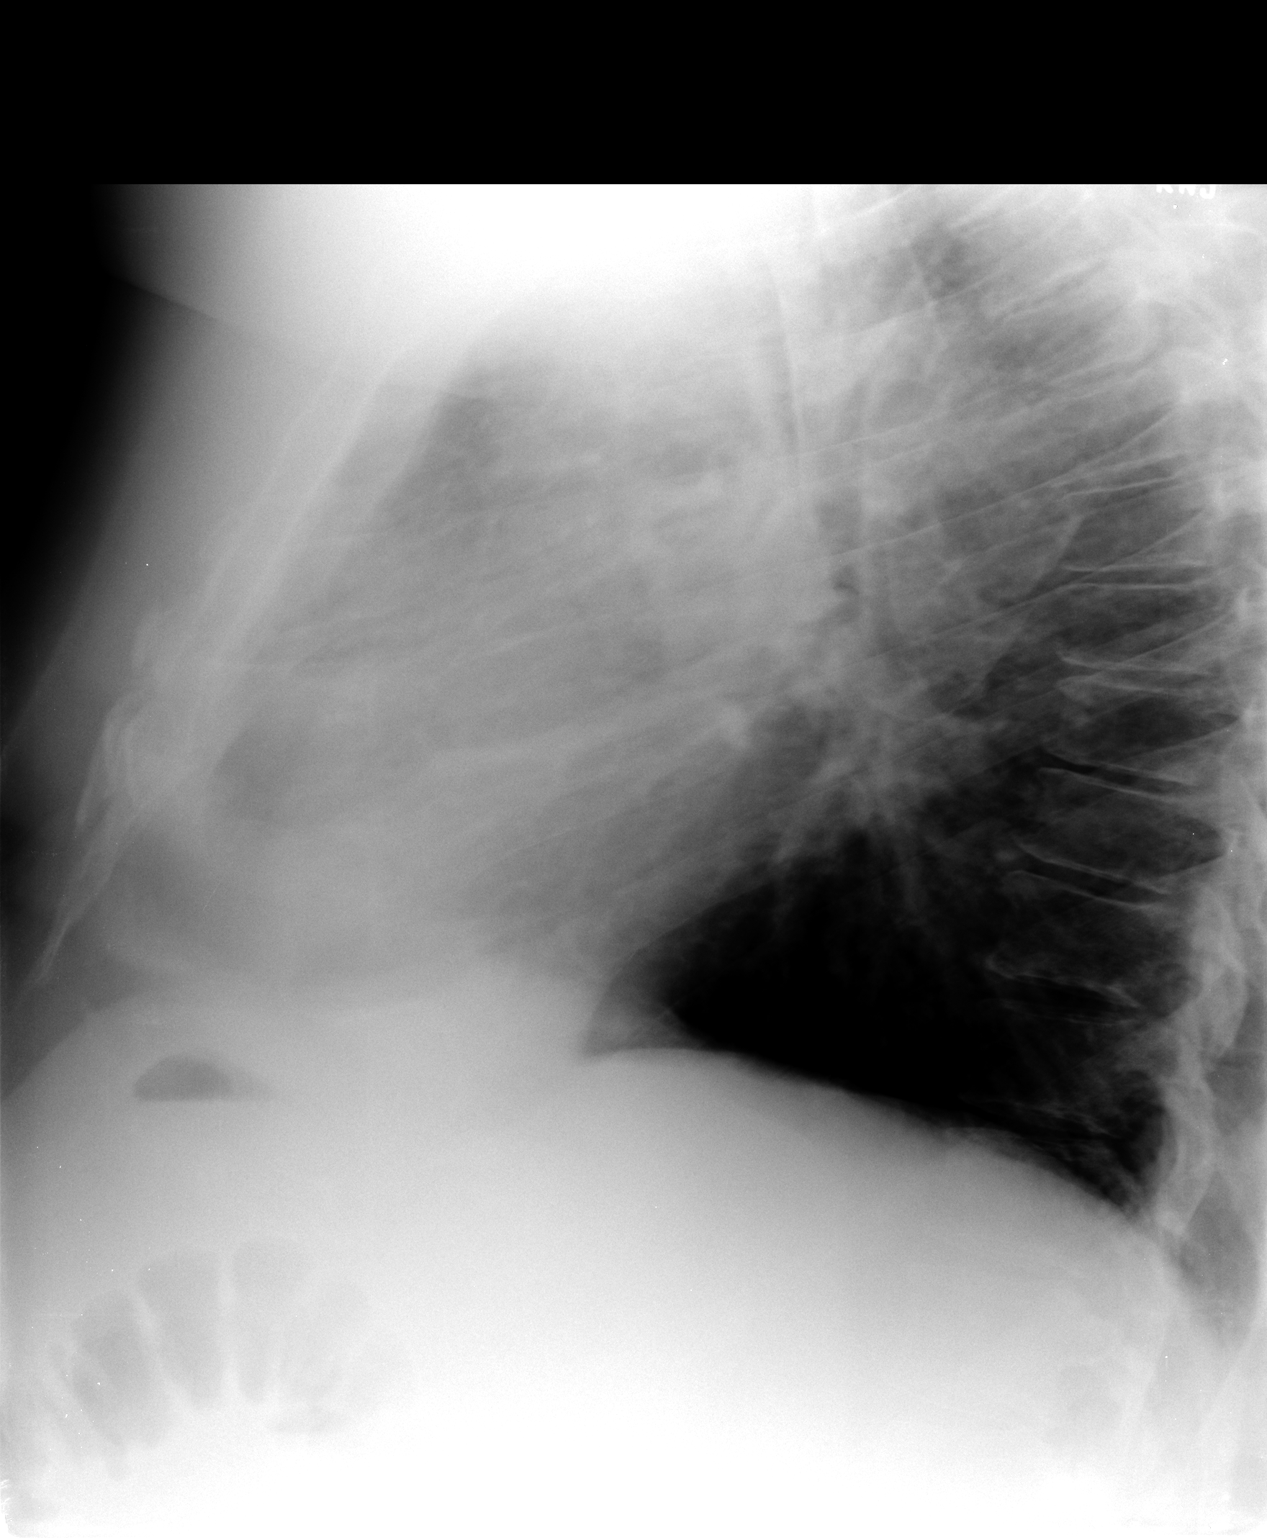

[2 of 2 positions shown; findings below may reference images not displayed]

FINDINGS: Heart size is normal.  Central peribronchial thickening
and interstitial prominence is stable.  No evidence of acute
infiltrate or pulmonary edema.  No evidence of pleural effusion.
No mass or lymphadenopathy identified.
IMPRESSION: Stable exam.  No active disease.

## 2014-04-05 ENCOUNTER — Ambulatory Visit (INDEPENDENT_AMBULATORY_CARE_PROVIDER_SITE_OTHER): Payer: BC Managed Care – PPO | Admitting: Family Medicine

## 2014-04-05 ENCOUNTER — Encounter: Payer: Self-pay | Admitting: Family Medicine

## 2014-04-05 VITALS — BP 110/70 | HR 80 | Temp 97.5°F | Resp 20 | Ht 72.25 in | Wt 337.0 lb

## 2014-04-05 DIAGNOSIS — E1165 Type 2 diabetes mellitus with hyperglycemia: Principal | ICD-10-CM

## 2014-04-05 DIAGNOSIS — IMO0001 Reserved for inherently not codable concepts without codable children: Secondary | ICD-10-CM

## 2014-04-05 DIAGNOSIS — N183 Chronic kidney disease, stage 3 unspecified: Secondary | ICD-10-CM | POA: Insufficient documentation

## 2014-04-05 DIAGNOSIS — Z125 Encounter for screening for malignant neoplasm of prostate: Secondary | ICD-10-CM

## 2014-04-05 DIAGNOSIS — Z1211 Encounter for screening for malignant neoplasm of colon: Secondary | ICD-10-CM

## 2014-04-05 DIAGNOSIS — I10 Essential (primary) hypertension: Secondary | ICD-10-CM

## 2014-04-05 LAB — LIPID PANEL
CHOLESTEROL: 141 mg/dL (ref 0–200)
HDL: 50 mg/dL (ref >39)
LDL Cholesterol: 76 mg/dL (ref 0–99)
Total CHOL/HDL Ratio: 2.8 Ratio
Triglycerides: 74 mg/dL (ref <150)
VLDL: 15 mg/dL (ref 0–40)

## 2014-04-05 LAB — COMPLETE METABOLIC PANEL WITH GFR
ALT: 21 U/L (ref 0–53)
AST: 18 U/L (ref 0–37)
Albumin: 4.1 g/dL (ref 3.5–5.2)
Alkaline Phosphatase: 45 U/L (ref 39–117)
BUN: 14 mg/dL (ref 6–23)
CALCIUM: 9.4 mg/dL (ref 8.4–10.5)
CHLORIDE: 100 meq/L (ref 96–112)
CO2: 26 mEq/L (ref 19–32)
CREATININE: 1.06 mg/dL (ref 0.50–1.35)
GFR, EST AFRICAN AMERICAN: 86 mL/min
GFR, EST NON AFRICAN AMERICAN: 74 mL/min
GLUCOSE: 160 mg/dL — AB (ref 70–99)
Potassium: 4.4 mEq/L (ref 3.5–5.3)
Sodium: 135 mEq/L (ref 135–145)
Total Bilirubin: 0.4 mg/dL (ref 0.2–1.2)
Total Protein: 7.2 g/dL (ref 6.0–8.3)

## 2014-04-05 LAB — HEMOGLOBIN A1C
Hgb A1c MFr Bld: 8.3 % — ABNORMAL HIGH (ref <5.7)
MEAN PLASMA GLUCOSE: 192 mg/dL — AB (ref <117)

## 2014-04-05 LAB — MICROALBUMIN, URINE: MICROALB UR: 5.14 mg/dL — AB (ref 0.00–1.89)

## 2014-04-05 NOTE — Progress Notes (Signed)
Subjective:    Patient ID: Steven Henry, male    DOB: 1951-08-18, 63 y.o.   MRN: [id number]  HPI Patient is overdue for followup of his diabetes mellitus type 2, his chronic kidney disease, and hypertension.  He is currently taking metformin 1000 mg by mouth twice a day. However his most recent creatinine was 1.59 in September of 2014. This would make this a high risk medication in this patient. He is overdue for a repeat renal function as well as hemoglobin A1c.  Patient is also overdue for a PSA as well as a colonoscopy to screen for colon cancer.  Unfortunately the patient continues to smoke. He continues to have +2 pitting edema in both legs. He is been unsuccessful in losing weight. He continues to 337 pounds.  He is not exercising. He is not checking his blood sugars. He is not following low carbohydrate diet. He is not taking an aspirin. He continues to have easy fatigability due to sleep apnea. His blood pressure is well controlled today.  Past Medical History  Diagnosis Date  . Sleep apnea     not on CPAP   . Diabetes mellitus type II, uncontrolled   . Morbid obesity   . Hypertension   . Diabetes mellitus without complication   . CKD (chronic kidney disease), stage III   . Sleep apnea    No past surgical history on file. Current Outpatient Prescriptions on File Prior to Visit  Medication Sig Dispense Refill  . atorvastatin (LIPITOR) 20 MG tablet TAKE 1 TABLET BY MOUTH DAILY AS DIRECTED  30 tablet  0  . furosemide (LASIX) 40 MG tablet TAKE 1 TABLET BY MOUTH DAILY  30 tablet  0  . metFORMIN (GLUCOPHAGE) 1000 MG tablet Take 1 tablet (1,000 mg total) by mouth 2 (two) times daily with a meal.  180 tablet  3  . spironolactone (ALDACTONE) 50 MG tablet Take 50 mg by mouth daily.      Marland Kitchen telmisartan-hydrochlorothiazide (MICARDIS HCT) 80-12.5 MG per tablet Take 0.5 tablets by mouth daily.  15 tablet  11   No current facility-administered medications on file prior to visit.   No Known  Allergies History   Social History  . Marital Status: Divorced    Spouse Name: N/A    Number of Children: 2  . Years of Education: N/A   Occupational History  . Self Employed    Social History Main Topics  . Smoking status: Current Every Day Smoker -- 0.50 packs/day for 30 years    Types: Cigarettes  . Smokeless tobacco: Never Used  . Alcohol Use: Yes     Comment: occasional  . Drug Use: No  . Sexual Activity: Not on file     Comment: divorced, 2 daughters.  works Psychologist, occupational for fairs, Social research officer, government.   Other Topics Concern  . Not on file   Social History Narrative  . No narrative on file   Family History  Problem Relation Age of Onset  . Colon cancer Mother   . Diabetes Mother   . Diabetes Sister   . Diabetes Sister   . Diabetes Sister   . Heart disease Mother       Review of Systems  All other systems reviewed and are negative.      Objective:   Physical Exam  Constitutional: He appears well-developed and well-nourished.  Cardiovascular: Normal rate, regular rhythm, normal heart sounds and intact distal pulses.  Exam reveals no gallop and no friction rub.  No murmur heard. Pulmonary/Chest: Effort normal and breath sounds normal. No respiratory distress. He has no wheezes. He has no rales.  Abdominal: Soft. Bowel sounds are normal. He exhibits no distension. There is no tenderness. There is no rebound and no guarding.  Musculoskeletal: He exhibits edema.          Assessment & Plan:  1. Type II or unspecified type diabetes mellitus without mention of complication, uncontrolled Check hemoglobin A1c. I will also check renal function. If renal function remains elevated, I would switch the patient from metformin to Januvia.  I have recommended annual diabetic eye exam. Also recommended aspirin 81 mg by mouth daily.  I have strongly recommended smoking cessation. We spent 10 minutes discussing this today - COMPLETE METABOLIC PANEL WITH GFR - Lipid panel -  Hemoglobin A1c - Microalbumin, urine  2. Morbid obesity I have recommended diet and exercise to lose substantial weight is means to help treat his swelling, his right-sided heart failure, and his  3. Screening for prostate cancer Check a screening PSA.  And also agrees to go for colonoscopy which I will schedule. - PSA  4. Essential hypertension Blood pressure is well controlled.

## 2014-04-06 ENCOUNTER — Other Ambulatory Visit: Payer: Self-pay | Admitting: Family Medicine

## 2014-04-06 LAB — PSA: PSA: 0.63 ng/mL (ref <=4.00)

## 2014-04-06 MED ORDER — SITAGLIPTIN PHOSPHATE 100 MG PO TABS: 100.0000 mg | ORAL_TABLET | Freq: Every day | ORAL | Status: DC

## 2014-04-26 ENCOUNTER — Other Ambulatory Visit: Payer: Self-pay | Admitting: Family Medicine

## 2014-06-22 ENCOUNTER — Other Ambulatory Visit: Payer: Self-pay | Admitting: Family Medicine

## 2014-06-22 ENCOUNTER — Encounter: Payer: Self-pay | Admitting: Family Medicine

## 2014-06-22 NOTE — Telephone Encounter (Signed)
Medication refill for one time only.  Patient needs to be seen.  Letter sent for patient to call and schedule 

## 2014-08-23 ENCOUNTER — Other Ambulatory Visit: Payer: Self-pay | Admitting: Family Medicine

## 2015-08-17 ENCOUNTER — Encounter: Payer: Self-pay | Admitting: Family Medicine

## 2015-08-17 ENCOUNTER — Ambulatory Visit (INDEPENDENT_AMBULATORY_CARE_PROVIDER_SITE_OTHER): Payer: 59 | Admitting: Family Medicine

## 2015-08-17 VITALS — BP 110/78 | HR 82 | Temp 97.8°F | Resp 20 | Ht 73.0 in | Wt 311.0 lb

## 2015-08-17 DIAGNOSIS — Z0000 Encounter for general adult medical examination without abnormal findings: Secondary | ICD-10-CM

## 2015-08-17 DIAGNOSIS — Z125 Encounter for screening for malignant neoplasm of prostate: Secondary | ICD-10-CM

## 2015-08-17 DIAGNOSIS — I10 Essential (primary) hypertension: Secondary | ICD-10-CM

## 2015-08-17 DIAGNOSIS — E119 Type 2 diabetes mellitus without complications: Secondary | ICD-10-CM

## 2015-08-17 LAB — CBC WITH DIFFERENTIAL/PLATELET
Basophils Absolute: 0 10*3/uL (ref 0.0–0.1)
Basophils Relative: 0 % (ref 0–1)
Eosinophils Absolute: 0.2 10*3/uL (ref 0.0–0.7)
Eosinophils Relative: 2 % (ref 0–5)
HEMATOCRIT: 49.1 % (ref 39.0–52.0)
Hemoglobin: 17.2 g/dL — ABNORMAL HIGH (ref 13.0–17.0)
LYMPHS ABS: 2.1 10*3/uL (ref 0.7–4.0)
Lymphocytes Relative: 24 % (ref 12–46)
MCH: 32.3 pg (ref 26.0–34.0)
MCHC: 35 g/dL (ref 30.0–36.0)
MCV: 92.1 fL (ref 78.0–100.0)
MONO ABS: 0.7 10*3/uL (ref 0.1–1.0)
MPV: 12 fL (ref 8.6–12.4)
Monocytes Relative: 8 % (ref 3–12)
Neutro Abs: 5.7 10*3/uL (ref 1.7–7.7)
Neutrophils Relative %: 66 % (ref 43–77)
Platelets: 240 10*3/uL (ref 150–400)
RBC: 5.33 MIL/uL (ref 4.22–5.81)
RDW: 13.4 % (ref 11.5–15.5)
WBC: 8.7 10*3/uL (ref 4.0–10.5)

## 2015-08-17 LAB — COMPLETE METABOLIC PANEL WITH GFR
ALT: 18 U/L (ref 9–46)
AST: 16 U/L (ref 10–35)
Albumin: 3.8 g/dL (ref 3.6–5.1)
Alkaline Phosphatase: 57 U/L (ref 40–115)
BUN: 10 mg/dL (ref 7–25)
CALCIUM: 9 mg/dL (ref 8.6–10.3)
CHLORIDE: 99 mmol/L (ref 98–110)
CO2: 26 mmol/L (ref 20–31)
Creat: 0.76 mg/dL (ref 0.70–1.25)
GFR, Est African American: >89 mL/min (ref >=60)
Glucose, Bld: 284 mg/dL — ABNORMAL HIGH (ref 70–99)
POTASSIUM: 4.3 mmol/L (ref 3.5–5.3)
Sodium: 136 mmol/L (ref 135–146)
Total Bilirubin: 0.5 mg/dL (ref 0.2–1.2)
Total Protein: 7.3 g/dL (ref 6.1–8.1)

## 2015-08-17 LAB — HEMOGLOBIN A1C
HEMOGLOBIN A1C: 12.8 % — AB (ref <5.7)
Mean Plasma Glucose: 321 mg/dL — ABNORMAL HIGH (ref <117)

## 2015-08-17 LAB — LIPID PANEL
CHOLESTEROL: 212 mg/dL — AB (ref 125–200)
HDL: 52 mg/dL (ref >=40)
LDL Cholesterol: 137 mg/dL — ABNORMAL HIGH (ref <130)
TRIGLYCERIDES: 114 mg/dL (ref <150)
Total CHOL/HDL Ratio: 4.1 Ratio (ref <=5.0)
VLDL: 23 mg/dL (ref <30)

## 2015-08-17 NOTE — Progress Notes (Signed)
Subjective:    Patient ID: Steven Henry, male    DOB: [birth year], 64 y.o.   MRN: [id number]  HPI 7/15 Patient is overdue for followup of his diabetes mellitus type 2, his chronic kidney disease, and hypertension.  He is currently taking metformin 1000 mg by mouth twice a day. However his most recent creatinine was 1.59 in September of 2014. This would make this a high risk medication in this patient. He is overdue for a repeat renal function as well as hemoglobin A1c.  Patient is also overdue for a PSA as well as a colonoscopy to screen for colon cancer.  Unfortunately the patient continues to smoke. He continues to have +2 pitting edema in both legs. He is been unsuccessful in losing weight. He continues to 337 pounds.  He is not exercising. He is not checking his blood sugars. He is not following low carbohydrate diet. He is not taking an aspirin. He continues to have easy fatigability due to sleep apnea. His blood pressure is well controlled today.  At that time, my plan was: 1. Type II or unspecified type diabetes mellitus without mention of complication, uncontrolled Check hemoglobin A1c. I will also check renal function. If renal function remains elevated, I would switch the patient from metformin to Januvia.  I have recommended annual diabetic eye exam. Also recommended aspirin 81 mg by mouth daily.  I have strongly recommended smoking cessation. We spent 10 minutes discussing this today - COMPLETE METABOLIC PANEL WITH GFR - Lipid panel - Hemoglobin A1c - Microalbumin, urine  2. Morbid obesity I have recommended diet and exercise to lose substantial weight is means to help treat his swelling, his right-sided heart failure, and his  3. Screening for prostate cancer Check a screening PSA.  And also agrees to go for colonoscopy which I will schedule. - PSA  4. Essential hypertension Blood pressure is well controlled.  08/17/15 Patient has been lost to follow-up. He has not been seen by  any doctor since. Patient discontinued all of his medications almost 1 year ago. He is on nothing for his diabetes or his cholesterol. He is no longer taking any diuretic. He is compliant wearing his CPAP machine. However he continues to smoke. He never went for the colonoscopy I had him scheduled for. He is due for Pneumovax 23 along with a flu shot but he refuses both of these. Overall he is doing well with no complaints. He has lost 26 pounds since his last office visit. However he also reports polyuria and polydipsia which I'm concerned may be a sign his blood sugars are out of control. He states he feels better than he has in years. He denies any complaints or any concerns. He would be willing to get a colonoscopy now. Thankfully the edema in his legs is much better trace pretibial pitting edema. He has chronic venous stasis changes and large varicose veins however in both legs   Past Medical History  Diagnosis Date  . Sleep apnea     not on CPAP   . Diabetes mellitus type II, uncontrolled ([place])   . Morbid obesity ([place])   . Hypertension   . Diabetes mellitus without complication ([place])   . CKD (chronic kidney disease), stage III   . Sleep apnea    No past surgical history on file. Current Outpatient Prescriptions on File Prior to Visit  Medication Sig Dispense Refill  . atorvastatin (LIPITOR) 20 MG tablet TAKE 1 TABLET BY MOUTH EVERY DAY AS  DIRECTED 30 tablet 0  . furosemide (LASIX) 40 MG tablet TAKE 1 TABLET BY MOUTH EVERY DAY 30 tablet 0  . metFORMIN (GLUCOPHAGE) 1000 MG tablet TAKE 1 TABLET BY MOUTH TWICE A DAY 60 tablet 0  . sitaGLIPtin (JANUVIA) 100 MG tablet Take 1 tablet (100 mg total) by mouth daily. 30 tablet 2  . spironolactone (ALDACTONE) 50 MG tablet Take 50 mg by mouth daily.    Marland Kitchen telmisartan-hydrochlorothiazide (MICARDIS HCT) 80-12.5 MG per tablet Take 0.5 tablets by mouth daily. 15 tablet 11   No current facility-administered medications on file prior to visit.   No Known  Allergies Social History   Social History  . Marital Status: Divorced    Spouse Name: N/A  . Number of Children: 2  . Years of Education: N/A   Occupational History  . Self Employed    Social History Main Topics  . Smoking status: Current Every Day Smoker -- 0.50 packs/day for 30 years    Types: Cigarettes  . Smokeless tobacco: Never Used  . Alcohol Use: Yes     Comment: occasional  . Drug Use: No  . Sexual Activity: Not on file     Comment: divorced, 2 daughters.  works Psychologist, occupational for fairs, Social research officer, government.   Other Topics Concern  . Not on file   Social History Narrative   Family History  Problem Relation Age of Onset  . Colon cancer Mother   . Diabetes Mother   . Diabetes Sister   . Diabetes Sister   . Diabetes Sister   . Heart disease Mother       Review of Systems  All other systems reviewed and are negative.      Objective:   Physical Exam  Constitutional: He is oriented to person, place, and time. He appears well-developed and well-nourished. No distress.  HENT:  Head: Normocephalic and atraumatic.  Right Ear: External ear normal.  Left Ear: External ear normal.  Nose: Nose normal.  Mouth/Throat: Oropharynx is clear and moist. No oropharyngeal exudate.  Eyes: Conjunctivae and EOM are normal. Pupils are equal, round, and reactive to light. Right eye exhibits no discharge. Left eye exhibits no discharge. No scleral icterus.  Neck: Normal range of motion. Neck supple. No JVD present. No tracheal deviation present. No thyromegaly present.  Cardiovascular: Normal rate, regular rhythm, normal heart sounds and intact distal pulses.  Exam reveals no gallop and no friction rub.   No murmur heard. Pulmonary/Chest: Effort normal and breath sounds normal. No stridor. No respiratory distress. He has no wheezes. He has no rales.  Abdominal: Soft. Bowel sounds are normal. He exhibits no distension and no mass. There is no tenderness. There is no rebound and no guarding.   Musculoskeletal: He exhibits edema. He exhibits no tenderness.  Lymphadenopathy:    He has no cervical adenopathy.  Neurological: He is alert and oriented to person, place, and time. He has normal reflexes. He displays normal reflexes. No cranial nerve deficit. He exhibits normal muscle tone. Coordination normal.  Skin: Skin is warm. No rash noted. He is not diaphoretic. No erythema. No pallor.  Psychiatric: He has a normal mood and affect. His behavior is normal. Judgment and thought content normal.          Assessment & Plan:   Controlled type 2 diabetes mellitus without complication, unspecified long term insulin use status ([place]) - Plan: COMPLETE METABOLIC PANEL WITH GFR, Hemoglobin A1c, Lipid panel, CBC with Differential/Platelet  Prostate cancer screening - Plan: PSA  Essential hypertension  Morbid obesity due to excess calories [facility])  Routine general medical examination at a health care facility  Patient is currently not taking any medication. He did not accurately report this to my nurse. He refuses a flu shot. He refuses a pneumonia vaccine. He will consent for colonoscopy. He would like me to obtain fasting lab work. At the present time his fluid status appears better than it has the last few times and seen the patient and therefore I see no reason for him to start a diuretic right now. His blood pressure is well controlled so I see no reason to start him on a blood pressure medication. However I'll be interested to see what his A1c in his cholesterol is. I strongly encouraged the patient quit smoking. I will check a PSA. I will schedule the patient for colonoscopy

## 2015-08-18 LAB — PSA: PSA: 0.91 ng/mL (ref <=4.00)

## 2015-08-25 ENCOUNTER — Telehealth: Payer: Self-pay

## 2015-08-25 ENCOUNTER — Ambulatory Visit (INDEPENDENT_AMBULATORY_CARE_PROVIDER_SITE_OTHER): Payer: 59 | Admitting: Family Medicine

## 2015-08-25 ENCOUNTER — Encounter: Payer: Self-pay | Admitting: Family Medicine

## 2015-08-25 VITALS — BP 130/80 | HR 84 | Temp 98.3°F | Resp 20 | Ht 73.0 in | Wt 311.0 lb

## 2015-08-25 DIAGNOSIS — E1100 Type 2 diabetes mellitus with hyperosmolarity without nonketotic hyperglycemic-hyperosmolar coma (NKHHC): Secondary | ICD-10-CM

## 2015-08-25 MED ORDER — METFORMIN HCL 1000 MG PO TABS: 1000.0000 mg | ORAL_TABLET | Freq: Two times a day (BID) | ORAL | Status: DC

## 2015-08-25 MED ORDER — BLOOD GLUCOSE METER KIT: PACK | Status: DC

## 2015-08-25 MED ORDER — GLIPIZIDE 10 MG PO TABS: 10.0000 mg | ORAL_TABLET | Freq: Every day | ORAL | Status: DC

## 2015-08-25 NOTE — Telephone Encounter (Signed)
Pt called to be triaged for the screening colonoscopy. This will be his first. I will check and see if PCP has done online referral since he has [place].

## 2015-08-25 NOTE — Telephone Encounter (Signed)
[phone]  PATIENT RECEIVED LETTER TO SCHEDULE TCS

## 2015-08-25 NOTE — Progress Notes (Signed)
Subjective:    Patient ID: Steven Henry, male    DOB: January 02, 1951, 64 y.o.   MRN: [id number]  HPI 7/15 Patient is overdue for followup of his diabetes mellitus type 2, his chronic kidney disease, and hypertension.  He is currently taking metformin 1000 mg by mouth twice a day. However his most recent creatinine was 1.59 in September of 2014. This would make this a high risk medication in this patient. He is overdue for a repeat renal function as well as hemoglobin A1c.  Patient is also overdue for a PSA as well as a colonoscopy to screen for colon cancer.  Unfortunately the patient continues to smoke. He continues to have +2 pitting edema in both legs. He is been unsuccessful in losing weight. He continues to 337 pounds.  He is not exercising. He is not checking his blood sugars. He is not following low carbohydrate diet. He is not taking an aspirin. He continues to have easy fatigability due to sleep apnea. His blood pressure is well controlled today.  At that time, my plan was: 1. Type II or unspecified type diabetes mellitus without mention of complication, uncontrolled Check hemoglobin A1c. I will also check renal function. If renal function remains elevated, I would switch the patient from metformin to Januvia.  I have recommended annual diabetic eye exam. Also recommended aspirin 81 mg by mouth daily.  I have strongly recommended smoking cessation. We spent 10 minutes discussing this today - COMPLETE METABOLIC PANEL WITH GFR - Lipid panel - Hemoglobin A1c - Microalbumin, urine  2. Morbid obesity I have recommended diet and exercise to lose substantial weight is means to help treat his swelling, his right-sided heart failure, and his  3. Screening for prostate cancer Check a screening PSA.  And also agrees to go for colonoscopy which I will schedule. - PSA  4. Essential hypertension Blood pressure is well controlled.  08/17/15 Patient has been lost to follow-up. He has not been seen by  any doctor since. Patient discontinued all of his medications almost 1 year ago. He is on nothing for his diabetes or his cholesterol. He is no longer taking any diuretic. He is compliant wearing his CPAP machine. However he continues to smoke. He never went for the colonoscopy I had him scheduled for. He is due for Pneumovax 23 along with a flu shot but he refuses both of these. Overall he is doing well with no complaints. He has lost 26 pounds since his last office visit. However he also reports polyuria and polydipsia which I'm concerned may be a sign his blood sugars are out of control. He states he feels better than he has in years. He denies any complaints or any concerns. He would be willing to get a colonoscopy now. Thankfully the edema in his legs is much better trace pretibial pitting edema. He has chronic venous stasis changes and large varicose veins however in both legs.  AT that time, my plan was: Patient is currently not taking any medication. He did not accurately report this to my nurse. He refuses a flu shot. He refuses a pneumonia vaccine. He will consent for colonoscopy. He would like me to obtain fasting lab work. At the present time his fluid status appears better than it has the last few times and seen the patient and therefore I see no reason for him to start a diuretic right now. His blood pressure is well controlled so I see no reason to start him on a  blood pressure medication. However I'll be interested to see what his A1c in his cholesterol is. I strongly encouraged the patient quit smoking. I will check a PSA. I will schedule the patient for colonoscopy  08/25/15 Please see his labs below.  Hemoglobin A1c was significantly elevated indicating an average blood sugar of 300. Today I had a long discussion with the patient. His diet is very poor. He eats a lot of carbohydrates including sweets, cakes, pies, drinks, junk food, bread, potatoes. He does not exercise at all. He is a very  nice man but he is also noncompliant and therefore I believe he would be a very risky insulin candidate. He tends to skip meals and go a long time without eating. Furthermore he does not check his sugars at all. Office Visit on 08/17/2015  Component Date Value Ref Range Status  . Sodium 08/17/2015 136  135 - 146 mmol/L Final  . Potassium 08/17/2015 4.3  3.5 - 5.3 mmol/L Final  . Chloride 08/17/2015 99  98 - 110 mmol/L Final  . CO2 08/17/2015 26  20 - 31 mmol/L Final  . Glucose, Bld 08/17/2015 284* 70 - 99 mg/dL Final  . BUN 08/17/2015 10  7 - 25 mg/dL Final  . Creat 08/17/2015 0.76  0.70 - 1.25 mg/dL Final  . Total Bilirubin 08/17/2015 0.5  0.2 - 1.2 mg/dL Final  . Alkaline Phosphatase 08/17/2015 57  40 - 115 U/L Final  . AST 08/17/2015 16  10 - 35 U/L Final  . ALT 08/17/2015 18  9 - 46 U/L Final  . Total Protein 08/17/2015 7.3  6.1 - 8.1 g/dL Final  . Albumin 08/17/2015 3.8  3.6 - 5.1 g/dL Final  . Calcium 08/17/2015 9.0  8.6 - 10.3 mg/dL Final  . GFR, Est African American 08/17/2015 >89  >=60 mL/min Final  . GFR, Est Non African American 08/17/2015 >89  >=60 mL/min Final   Comment:   The estimated GFR is a calculation valid for adults (>=38 years old) that uses the CKD-EPI algorithm to adjust for age and sex. It is   not to be used for children, pregnant women, hospitalized patients,    patients on dialysis, or with rapidly changing kidney function. According to the NKDEP, eGFR >89 is normal, 60-89 shows mild impairment, 30-59 shows moderate impairment, 15-29 shows severe impairment and <15 is ESRD.     Marland Kitchen Hgb A1c MFr Bld 08/17/2015 12.8* <5.7 % Final   Comment:                                                                        According to the ADA Clinical Practice Recommendations for 2011, when HbA1c is used as a screening test:     >=6.5%   Diagnostic of Diabetes Mellitus            (if abnormal result is confirmed)   5.7-6.4%   Increased risk of developing Diabetes  [place]   References:Diagnosis and Classification of Diabetes Mellitus,Diabetes NOTR,7116,57(XUXYB 1):S62-S69 and Standards of Medical Care in         Diabetes - 2011,Diabetes FXOV,2919,16 (Suppl 1):S11-S61.     . Mean Plasma Glucose 08/17/2015 321* <117 mg/dL Final  . Cholesterol 08/17/2015 212* 125 -  200 mg/dL Final  . Triglycerides 08/17/2015 114  <150 mg/dL Final  . HDL 08/17/2015 52  >=40 mg/dL Final  . Total CHOL/HDL Ratio 08/17/2015 4.1  <=5.0 Ratio Final  . VLDL 08/17/2015 23  <30 mg/dL Final  . LDL Cholesterol 08/17/2015 137* <130 mg/dL Final   Comment:   Total Cholesterol/HDL Ratio:CHD Risk                        Coronary Heart Disease Risk Table                                        Men       Women          1/2 Average Risk              3.4        3.3              Average Risk              5.0        4.4           2X Average Risk              9.6        7.1           3X Average Risk             23.4       11.0 Use the calculated Patient Ratio above and the CHD Risk table  to determine the patient's CHD Risk.   . WBC 08/17/2015 8.7  4.0 - 10.5 K/uL Final  . RBC 08/17/2015 5.33  4.22 - 5.81 MIL/uL Final  . Hemoglobin 08/17/2015 17.2* 13.0 - 17.0 g/dL Final  . HCT 08/17/2015 49.1  39.0 - 52.0 % Final  . MCV 08/17/2015 92.1  78.0 - 100.0 fL Final  . MCH 08/17/2015 32.3  26.0 - 34.0 pg Final  . MCHC 08/17/2015 35.0  30.0 - 36.0 g/dL Final  . RDW 08/17/2015 13.4  11.5 - 15.5 % Final  . Platelets 08/17/2015 240  150 - 400 K/uL Final  . MPV 08/17/2015 12.0  8.6 - 12.4 fL Final  . Neutrophils Relative % 08/17/2015 66  43 - 77 % Final  . Neutro Abs 08/17/2015 5.7  1.7 - 7.7 K/uL Final  . Lymphocytes Relative 08/17/2015 24  12 - 46 % Final  . Lymphs Abs 08/17/2015 2.1  0.7 - 4.0 K/uL Final  . Monocytes Relative 08/17/2015 8  3 - 12 % Final  . Monocytes Absolute 08/17/2015 0.7  0.1 - 1.0 K/uL Final  . Eosinophils Relative 08/17/2015 2  0 - 5 % Final  . Eosinophils Absolute  08/17/2015 0.2  0.0 - 0.7 K/uL Final  . Basophils Relative 08/17/2015 0  0 - 1 % Final  . Basophils Absolute 08/17/2015 0.0  0.0 - 0.1 K/uL Final  . Smear Review 08/17/2015 Criteria for review not met   Final  . PSA 08/17/2015 0.91  <=4.00 ng/mL Final   Comment: Test Methodology: ECLIA PSA (Electrochemiluminescence Immunoassay)   For PSA values from 2.5-4.0, particularly in younger men <36 years old, the AUA and NCCN suggest testing for % Free PSA (3515) and evaluation of the rate of increase in PSA (PSA velocity).       Past Medical History  Diagnosis Date  . Sleep apnea     not on CPAP   . Diabetes mellitus type II, uncontrolled ([place])   . Morbid obesity ([place])   . Hypertension   . Diabetes mellitus without complication ([place])   . CKD (chronic kidney disease), stage III   . Sleep apnea    No past surgical history on file. Current Outpatient Prescriptions on File Prior to Visit  Medication Sig Dispense Refill  . atorvastatin (LIPITOR) 20 MG tablet TAKE 1 TABLET BY MOUTH EVERY DAY AS DIRECTED 30 tablet 0  . furosemide (LASIX) 40 MG tablet TAKE 1 TABLET BY MOUTH EVERY DAY 30 tablet 0  . metFORMIN (GLUCOPHAGE) 1000 MG tablet TAKE 1 TABLET BY MOUTH TWICE A DAY 60 tablet 0  . sitaGLIPtin (JANUVIA) 100 MG tablet Take 1 tablet (100 mg total) by mouth daily. 30 tablet 2  . spironolactone (ALDACTONE) 50 MG tablet Take 50 mg by mouth daily.    Marland Kitchen telmisartan-hydrochlorothiazide (MICARDIS HCT) 80-12.5 MG per tablet Take 0.5 tablets by mouth daily. 15 tablet 11   No current facility-administered medications on file prior to visit.   No Known Allergies Social History   Social History  . Marital Status: Divorced    Spouse Name: N/A  . Number of Children: 2  . Years of Education: N/A   Occupational History  . Self Employed    Social History Main Topics  . Smoking status: Current Every Day Smoker -- 0.50 packs/day for 30 years    Types: Cigarettes  . Smokeless tobacco: Never Used    . Alcohol Use: Yes     Comment: occasional  . Drug Use: No  . Sexual Activity: Not on file     Comment: divorced, 2 daughters.  works Psychologist, occupational for fairs, Social research officer, government.   Other Topics Concern  . Not on file   Social History Narrative   Family History  Problem Relation Age of Onset  . Colon cancer Mother   . Diabetes Mother   . Diabetes Sister   . Diabetes Sister   . Diabetes Sister   . Heart disease Mother       Review of Systems  All other systems reviewed and are negative.      Objective:   Physical Exam  Constitutional: He appears well-developed and well-nourished. No distress.  Cardiovascular: Normal rate, regular rhythm, normal heart sounds and intact distal pulses.  Exam reveals no gallop and no friction rub.   No murmur heard. Pulmonary/Chest: Effort normal and breath sounds normal. No respiratory distress. He has no wheezes. He has no rales.  Abdominal: Soft. Bowel sounds are normal. He exhibits no distension and no mass. There is no tenderness. There is no rebound and no guarding.  Musculoskeletal: He exhibits edema.  Skin: He is not diaphoretic.          Assessment & Plan:   Uncontrolled type 2 diabetes mellitus with hyperosmolarity without coma, without long-term current use of insulin ([place]) - Plan: metFORMIN (GLUCOPHAGE) 1000 MG tablet, glipiZIDE (GLUCOTROL) 10 MG tablet, Ambulatory referral to diabetic education  I will schedule the patient to meet with a nutritionist. We spent 20 minutes discussing a low carbohydrate diet and ways to control his blood sugars. I want him to start checking his fasting blood sugar and two-hour postprandial sugars every day and write those down and bring them to me in one month. I will start the patient on metformin 1000 mg by mouth twice a day, Glucotrol 10 mg by  mouth daily, and Invokana 300 mg poqday and recheck his sugars in one month. If his blood sugars are not improving at that time, I will switch the patient to  insulin.

## 2015-08-26 NOTE — Telephone Encounter (Signed)
See separate triage note.  

## 2015-08-29 NOTE — Telephone Encounter (Signed)
Sent fax to Dr. Samella Parr on 08/26/2015 for info on the referral.

## 2015-09-06 ENCOUNTER — Other Ambulatory Visit: Payer: Self-pay

## 2015-09-06 ENCOUNTER — Telehealth: Payer: Self-pay | Admitting: Family Medicine

## 2015-09-06 ENCOUNTER — Telehealth: Payer: Self-pay | Admitting: *Deleted

## 2015-09-06 DIAGNOSIS — Z1211 Encounter for screening for malignant neoplasm of colon: Secondary | ICD-10-CM

## 2015-09-06 MED ORDER — PEG 3350-KCL-NA BICARB-NACL 420 G PO SOLR: 4000.0000 mL | ORAL | Status: DC

## 2015-09-06 NOTE — Telephone Encounter (Signed)
Patient calling to say that he needs referral for his colonscopy  [phone]

## 2015-09-06 NOTE — Telephone Encounter (Signed)
Submitted referral thru [facility] on 09/01/15 to Dr.Robert Rourk with referral number [id number]  Type of referral: consult and treat  Number of visits:6  Start Date:09/01/15  End Date: 03/01/16  Dx:Z00.00-Encounter for general adult medical examination without abnormal findings  Copy has been sent to Dr. Gala Romney office

## 2015-09-06 NOTE — Telephone Encounter (Signed)
Rx sent to the pharmacy and instructions mailed to pt.  C-pap noted in the comments Hoyle Sauer aware).

## 2015-09-06 NOTE — Telephone Encounter (Signed)
Pt is scheduled for 09/16/2015 @ 8:15 Am.

## 2015-09-06 NOTE — Telephone Encounter (Addendum)
Gastroenterology Pre-Procedure Review  Request Date: 08/25/2015 Requesting Physician: Dr. Dennard Schaumann  PATIENT REVIEW QUESTIONS: The patient responded to the following health history questions as indicated:     1. Diabetes Melitis: YES 2. Joint replacements in the past 12 months: no 3. Major health problems in the past 3 months: no 4. Has an artificial valve or MVP: no 5. Has a defibrillator: no 6. Has been advised in past to take antibiotics in advance of a procedure like teeth cleaning: no 7. Family history of colon cancer: no  8. Alcohol Use: YES    A couple of beers weekly 9. History of sleep apnea: HAS C PAP     MEDICATIONS & ALLERGIES:    Patient reports the following regarding taking any blood thinners:   Plavix? no Aspirin? no Coumadin? no  Patient confirms/reports the following medications:  Current Outpatient Prescriptions  Medication Sig Dispense Refill  . canagliflozin (INVOKANA) 300 MG TABS tablet Take 300 mg by mouth daily before breakfast.    . glipiZIDE (GLUCOTROL) 10 MG tablet Take 1 tablet (10 mg total) by mouth daily before breakfast. 30 tablet 5  . metFORMIN (GLUCOPHAGE) 1000 MG tablet Take 1 tablet (1,000 mg total) by mouth 2 (two) times daily. 60 tablet 3  . telmisartan-hydrochlorothiazide (MICARDIS HCT) 80-12.5 MG per tablet Take 0.5 tablets by mouth daily. 15 tablet 11  . blood glucose meter kit and supplies Dispense based on patient and insurance preference. Use up to four times daily as directed. (FOR ICD-10 E11.9). 1 each 5   No current facility-administered medications for this visit.    Patient confirms/reports the following allergies:  No Known Allergies  No orders of the defined types were placed in this encounter.    AUTHORIZATION INFORMATION Primary Insurance:   ID #:  Group #:  Pre-Cert / Auth required: Pre-Cert / Auth #:   Secondary Insurance:  ID #:   Group #:  Pre-Cert / Auth required:  Pre-Cert / Auth #:   SCHEDULE  INFORMATION: Procedure has been scheduled as follows:  Date:                    Time:   Location:   This Gastroenterology Pre-Precedure Review Form is being routed to the following provider(s): R. Garfield Cornea, MD

## 2015-09-06 NOTE — Telephone Encounter (Signed)
OK to schedule.  Day of prep: invokana 150mg  before bf, glucotrol 5mg  before bf, metformin 500mg  bid. Let endo know he has CPAP.

## 2015-09-07 NOTE — Telephone Encounter (Signed)
Placed referral thru [facility] compass and contacted pt to make aware

## 2015-09-15 ENCOUNTER — Telehealth: Payer: Self-pay

## 2015-09-15 NOTE — Telephone Encounter (Signed)
I called [employer] and spoke to Dymon T who said PA is required for the screening colonoscopy as outpatient. PA # [id number].  I called [facility] and left the Vm at [phone].

## 2015-09-16 ENCOUNTER — Encounter (HOSPITAL_COMMUNITY): Payer: Self-pay | Admitting: *Deleted

## 2015-09-16 ENCOUNTER — Ambulatory Visit (HOSPITAL_COMMUNITY)
Admission: RE | Admit: 2015-09-16 | Discharge: 2015-09-16 | Disposition: A | Discharge status: Home / Self Care | Payer: 59 | Source: Ambulatory Visit | Attending: Internal Medicine | Admitting: Internal Medicine

## 2015-09-16 ENCOUNTER — Encounter (HOSPITAL_COMMUNITY)
Admission: RE | Disposition: A | Discharge status: Home / Self Care | Payer: Self-pay | Source: Ambulatory Visit | Attending: Internal Medicine

## 2015-09-16 DIAGNOSIS — K635 Polyp of colon: Secondary | ICD-10-CM

## 2015-09-16 DIAGNOSIS — Z1211 Encounter for screening for malignant neoplasm of colon: Secondary | ICD-10-CM | POA: Insufficient documentation

## 2015-09-16 DIAGNOSIS — D124 Benign neoplasm of descending colon: Secondary | ICD-10-CM | POA: Diagnosis not present

## 2015-09-16 DIAGNOSIS — K5730 Diverticulosis of large intestine without perforation or abscess without bleeding: Secondary | ICD-10-CM | POA: Diagnosis not present

## 2015-09-16 DIAGNOSIS — Z7984 Long term (current) use of oral hypoglycemic drugs: Secondary | ICD-10-CM | POA: Diagnosis not present

## 2015-09-16 DIAGNOSIS — I129 Hypertensive chronic kidney disease with stage 1 through stage 4 chronic kidney disease, or unspecified chronic kidney disease: Secondary | ICD-10-CM | POA: Diagnosis not present

## 2015-09-16 DIAGNOSIS — D128 Benign neoplasm of rectum: Secondary | ICD-10-CM | POA: Insufficient documentation

## 2015-09-16 DIAGNOSIS — Z800 Family history of malignant neoplasm of digestive organs: Secondary | ICD-10-CM | POA: Diagnosis not present

## 2015-09-16 DIAGNOSIS — F17210 Nicotine dependence, cigarettes, uncomplicated: Secondary | ICD-10-CM | POA: Diagnosis not present

## 2015-09-16 DIAGNOSIS — D123 Benign neoplasm of transverse colon: Secondary | ICD-10-CM | POA: Diagnosis not present

## 2015-09-16 DIAGNOSIS — E1122 Type 2 diabetes mellitus with diabetic chronic kidney disease: Secondary | ICD-10-CM | POA: Insufficient documentation

## 2015-09-16 DIAGNOSIS — K621 Rectal polyp: Secondary | ICD-10-CM | POA: Diagnosis not present

## 2015-09-16 DIAGNOSIS — N183 Chronic kidney disease, stage 3 (moderate): Secondary | ICD-10-CM | POA: Insufficient documentation

## 2015-09-16 DIAGNOSIS — Z860100 Personal history of colon polyps, unspecified: Secondary | ICD-10-CM | POA: Insufficient documentation

## 2015-09-16 DIAGNOSIS — Z6841 Body Mass Index (BMI) 40.0 and over, adult: Secondary | ICD-10-CM | POA: Diagnosis not present

## 2015-09-16 HISTORY — PX: COLONOSCOPY: SHX5424

## 2015-09-16 LAB — GLUCOSE, CAPILLARY: Glucose-Capillary: 122 mg/dL — ABNORMAL HIGH (ref 65–99)

## 2015-09-16 SURGERY — COLONOSCOPY
Anesthesia: Moderate Sedation

## 2015-09-16 MED ORDER — MEPERIDINE HCL 100 MG/ML IJ SOLN
INTRAMUSCULAR | Status: DC | PRN
  Administered 2015-09-16: 50 mg via INTRAVENOUS
  Administered 2015-09-16 (×3): 25 mg via INTRAVENOUS

## 2015-09-16 MED ORDER — ONDANSETRON HCL 4 MG/2ML IJ SOLN
INTRAMUSCULAR | Status: DC | PRN
  Administered 2015-09-16: 4 mg via INTRAVENOUS

## 2015-09-16 MED ORDER — MEPERIDINE HCL 100 MG/ML IJ SOLN
INTRAMUSCULAR | Status: AC
  Filled 2015-09-16: qty 2

## 2015-09-16 MED ORDER — STERILE WATER FOR IRRIGATION IR SOLN
Status: DC | PRN
  Administered 2015-09-16: 08:00:00

## 2015-09-16 MED ORDER — MIDAZOLAM HCL 5 MG/5ML IJ SOLN
INTRAMUSCULAR | Status: DC | PRN
  Administered 2015-09-16 (×2): 1 mg via INTRAVENOUS
  Administered 2015-09-16: 2 mg via INTRAVENOUS
  Administered 2015-09-16 (×2): 1 mg via INTRAVENOUS

## 2015-09-16 MED ORDER — ONDANSETRON HCL 4 MG/2ML IJ SOLN
INTRAMUSCULAR | Status: AC
  Filled 2015-09-16: qty 2

## 2015-09-16 MED ORDER — MIDAZOLAM HCL 5 MG/5ML IJ SOLN
INTRAMUSCULAR | Status: AC
  Filled 2015-09-16: qty 10

## 2015-09-16 MED ORDER — SODIUM CHLORIDE 0.9 % IV SOLN: INTRAVENOUS | Status: DC

## 2015-09-16 NOTE — H&P (Signed)
@LOGO @   Primary Care Physician:  Odette Fraction, MD Primary Gastroenterologist:  Dr. Gala Romney  Pre-Procedure History & Physical: HPI:  Steven Henry is a 64 y.o. male is here for a screening colonoscopy. No bowel symptoms. No prior colonoscopy. Mother reportedly diagnosed with colon cancer at age 42.  Past Medical History  Diagnosis Date  . Sleep apnea     not on CPAP   . Diabetes mellitus type II, uncontrolled ([place])   . Morbid obesity ([place])   . Hypertension   . Diabetes mellitus without complication ([place])   . CKD (chronic kidney disease), stage III   . Sleep apnea     History reviewed. No pertinent past surgical history.  Prior to Admission medications   Medication Sig Start Date End Date Taking? Authorizing Provider  canagliflozin (INVOKANA) 300 MG TABS tablet Take 300 mg by mouth daily before breakfast.   Yes Historical Provider, MD  glipiZIDE (GLUCOTROL) 10 MG tablet Take 1 tablet (10 mg total) by mouth daily before breakfast. 08/25/15  Yes [name], MD  metFORMIN (GLUCOPHAGE) 1000 MG tablet Take 1 tablet (1,000 mg total) by mouth 2 (two) times daily. 08/25/15  Yes [name], MD  polyethylene glycol-electrolytes (TRILYTE) 420 G solution Take 4,000 mLs by mouth as directed. 09/06/15  Yes Daneil Dolin, MD  telmisartan-hydrochlorothiazide (MICARDIS HCT) 80-12.5 MG per tablet Take 0.5 tablets by mouth daily. 04/20/13   Tanda Rockers, MD    Allergies as of 09/06/2015  . (No Known Allergies)    Family History  Problem Relation Age of Onset  . Colon cancer Mother   . Diabetes Mother   . Diabetes Sister   . Diabetes Sister   . Diabetes Sister   . Heart disease Mother     Social History   Social History  . Marital Status: Divorced    Spouse Name: N/A  . Number of Children: 2  . Years of Education: N/A   Occupational History  . Self Employed    Social History Main Topics  . Smoking status: Current Every Day Smoker -- 0.50 packs/day for 30 years    Types: Cigarettes  . Smokeless tobacco: Never Used  . Alcohol Use: Yes     Comment: occasional  . Drug Use: No  . Sexual Activity: Not on file     Comment: divorced, 2 daughters.  works Psychologist, occupational for fairs, Social research officer, government.   Other Topics Concern  . Not on file   Social History Narrative    Review of Systems: See HPI, otherwise negative ROS  Physical Exam: BP 118/77 mmHg  Pulse 84  Temp(Src) 98.7 F (37.1 C) (Oral)  Resp 18  Ht 6\' 1"  (1.854 m)  Wt 311 lb (141.069 kg)  BMI 41.04 kg/m2  SpO2 96% General:   Alert,  Well-developed, well-nourished, pleasant and cooperative in NAD Neck:  Supple; no masses or thyromegaly. Lungs:  Clear throughout to auscultation.   No wheezes, crackles, or rhonchi. No acute distress. Heart:  Regular rate and rhythm; no murmurs, clicks, rubs,  or gallops. Abdomen:  Soft, nontender and nondistended. No masses, hepatosplenomegaly or hernias noted. Normal bowel sounds, without guarding, and without rebound.   Msk:  Symmetrical without gross deformities. Normal posture. Pulses:  Normal pulses noted. Extremities:  Without clubbing or edema.  Impression/Plan: [name] is now here to undergo a screening colonoscopy.  First-ever examination. Mother with reported colon cancer but at advanced age. Risks, benefits, limitations, imponderables and alternatives regarding colonoscopy have been  reviewed with the patient. Questions have been answered. All parties agreeable.     Notice:  This dictation was prepared with Dragon dictation along with smaller phrase technology. Any transcriptional errors that result from this process are unintentional and may not be corrected upon review.

## 2015-09-16 NOTE — Op Note (Signed)
[facility] [address] [place], [address]   COLONOSCOPY PROCEDURE REPORT  PATIENT: Steven Henry, Steven Henry  MR#: [id number] BIRTHDATE: 03/26/1951 , 36  yrs. old GENDER: male ENDOSCOPIST: R.  Garfield Cornea, MD FACP [facility] REFERRED [id number] Dennard Schaumann, M.D. PROCEDURE DATE:  09/23/2015 PROCEDURE:   Colonoscopy with snare polypectomy INDICATIONS:First-ever average risk colorectal cancer screening examination. MEDICATIONS: Versed 6 mg IV and Demerol 125 mg IV.  Zofran 4 mg IV.  ASA CLASS:       Class III  CONSENT: The risks, benefits, alternatives and imponderables including but not limited to bleeding, perforation as well as the possibility of a missed lesion have been reviewed.  The potential for biopsy, lesion removal, etc. have also been discussed. Questions have been answered.  All parties agreeable.  Please see the history and physical in the medical record for more information.  DESCRIPTION OF PROCEDURE:   After the risks benefits and alternatives of the procedure were thoroughly explained, informed consent was obtained.  The digital rectal exam revealed no abnormalities of the rectum.   The [id number]  endoscope was introduced through the anus and advanced to the cecum, which was identified by both the appendix and ileocecal valve. No adverse events experienced.   The quality of the prep was adequate  The instrument was then slowly withdrawn as the colon was fully examined. Estimated blood loss is zero unless otherwise noted in this procedure report.      COLON FINDINGS: (1) 3 mm polyp in the rectum; (2) 6 mm polyps at rectosigmoid junction; (2) 5 mm descending colon polyps; the largest polyp was 5 x 12 mm at the hepatic flexure; the remainder of the colonic mucosa appeared normal.  Retroflexion was performed. .  Patient extensive left-sided diverticula.  Multiple hot and cold snare polypectomies were performed removing the rectal and left colon  polyp. The polyp in hepatic flexure was removed completely with 3 passes of hot snare cautery.  Withdrawal time=24 minutes 0 seconds.  The scope was withdrawn and the procedure completed. COMPLICATIONS: There were no immediate complications.  ENDOSCOPIC IMPRESSION: Multiple colonic polyps?"removed as described above.  Colonic diverticulosis  RECOMMENDATIONS: Follow-up on pathology.  eSigned:  R. Garfield Cornea, MD Rosalita Chessman [facility] 2015/09/23 9:26 AM   cc:  CPT CODES: ICD CODES:  The ICD and CPT codes recommended by this software are interpretations from the data that the clinical staff has captured with the software.  The verification of the translation of this report to the ICD and CPT codes and modifiers is the sole responsibility of the health care institution and practicing physician where this report was generated.  [facility] [facility]. will not be held responsible for the validity of the ICD and CPT codes included on this report.  AMA assumes no liability for data contained or not contained herein. CPT is a Designer, television/film set of the [employer].  PATIENT NAME:  [name], [name] MR#: [id number]

## 2015-09-16 NOTE — Discharge Instructions (Signed)
°Colonoscopy °Discharge Instructions ° °Read the instructions outlined below and refer to this sheet in the next few weeks. These discharge instructions provide you with general information on caring for yourself after you leave the hospital. Your doctor may also give you specific instructions. While your treatment has been planned according to the most current medical practices available, unavoidable complications occasionally occur. If you have any problems or questions after discharge, call Dr. Rourk at [phone]. °ACTIVITY °· You may resume your regular activity, but move at a slower pace for the next 24 hours.  °· Take frequent rest periods for the next 24 hours.  °· Walking will help get rid of the air and reduce the bloated feeling in your belly (abdomen).  °· No driving for 24 hours (because of the medicine (anesthesia) used during the test).   °· Do not sign any important legal documents or operate any machinery for 24 hours (because of the anesthesia used during the test).  °NUTRITION °· Drink plenty of fluids.  °· You may resume your normal diet as instructed by your doctor.  °· Begin with a light meal and progress to your normal diet. Heavy or fried foods are harder to digest and may make you feel sick to your stomach (nauseated).  °· Avoid alcoholic beverages for 24 hours or as instructed.  °MEDICATIONS °· You may resume your normal medications unless your doctor tells you otherwise.  °WHAT YOU CAN EXPECT TODAY °· Some feelings of bloating in the abdomen.  °· Passage of more gas than usual.  °· Spotting of blood in your stool or on the toilet paper.  °IF YOU HAD POLYPS REMOVED DURING THE COLONOSCOPY: °· No aspirin products for 7 days or as instructed.  °· No alcohol for 7 days or as instructed.  °· Eat a soft diet for the next 24 hours.  °FINDING OUT THE RESULTS OF YOUR TEST °Not all test results are available during your visit. If your test results are not back during the visit, make an appointment  with your caregiver to find out the results. Do not assume everything is normal if you have not heard from your caregiver or the medical facility. It is important for you to follow up on all of your test results.  °SEEK IMMEDIATE MEDICAL ATTENTION IF: °· You have more than a spotting of blood in your stool.  °· Your belly is swollen (abdominal distention).  °· You are nauseated or vomiting.  °· You have a temperature over 101.  °· You have abdominal pain or discomfort that is severe or gets worse throughout the day.  ° ° °Polyp and diverticulosis information provided ° °Further recommendations to follow pending review of pathology report ° °Diverticulosis °Diverticulosis is the condition that develops when small pouches (diverticula) form in the wall of your colon. Your colon, or large intestine, is where water is absorbed and stool is formed. The pouches form when the inside layer of your colon pushes through weak spots in the outer layers of your colon. °CAUSES  °No one knows exactly what causes diverticulosis. °RISK FACTORS °· Being older than 50. Your risk for this condition increases with age. Diverticulosis is rare in people younger than 40 years. By age 80, almost everyone has it. °· Eating a low-fiber diet. °· Being frequently constipated. °· Being overweight. °· Not getting enough exercise. °· Smoking. °· Taking over-the-counter pain medicines, like aspirin and ibuprofen. °SYMPTOMS  °Most people with diverticulosis do not have symptoms. °DIAGNOSIS  °Because   Because diverticulosis often has no symptoms, health care providers often discover the condition during an exam for other colon problems. In many cases, a health care provider will diagnose diverticulosis while using a flexible scope to examine the colon (colonoscopy). °TREATMENT  °If you have never developed an infection related to diverticulosis, you may not need treatment. If you have had an infection before, treatment may include: °· Eating more  fruits, vegetables, and grains. °· Taking a fiber supplement. °· Taking a live bacteria supplement (probiotic). °· Taking medicine to relax your colon. °HOME CARE INSTRUCTIONS  °· Drink at least 6-8 glasses of water each day to prevent constipation. °· Try not to strain when you have a bowel movement. °· Keep all follow-up appointments. °If you have had an infection before:  °· Increase the fiber in your diet as directed by your health care provider or dietitian. °· Take a dietary fiber supplement if your health care provider approves. °· Only take medicines as directed by your health care provider. °SEEK MEDICAL CARE IF:  °· You have abdominal pain. °· You have bloating. °· You have cramps. °· You have not gone to the bathroom in 3 days. °SEEK IMMEDIATE MEDICAL CARE IF:  °· Your pain gets worse. °· Your bloating becomes very bad. °· You have a fever or chills, and your symptoms suddenly get worse. °· You begin vomiting. °· You have bowel movements that are bloody or black. °MAKE SURE YOU: °· Understand these instructions. °· Will watch your condition. °· Will get help right away if you are not doing well or get worse. °  °This information is not intended to replace advice given to you by your health care provider. Make sure you discuss any questions you have with your health care provider. °  °Document Released: 06/07/2004 Document Revised: 09/15/2013 Document Reviewed: 08/05/2013 °Elsevier Interactive Patient Education ©2016 [employer] °Colon Polyps °Polyps are lumps of extra tissue growing inside the body. Polyps can grow in the large intestine (colon). Most colon polyps are noncancerous (benign). However, some colon polyps can become cancerous over time. Polyps that are larger than a pea may be harmful. To be safe, caregivers remove and test all polyps. °CAUSES  °Polyps form when mutations in the genes cause your cells to grow and divide even though no more tissue is needed. °RISK FACTORS °There are a number  of risk factors that can increase your chances of getting colon polyps. They include: °· Being older than 50 years. °· Family history of colon polyps or colon cancer. °· Long-term colon diseases, such as colitis or Crohn disease. °· Being overweight. °· Smoking. °· Being inactive. °· Drinking too much alcohol. °SYMPTOMS  °Most small polyps do not cause symptoms. If symptoms are present, they may include: °· Blood in the stool. The stool may look dark red or black. °· Constipation or diarrhea that lasts longer than 1 week. °DIAGNOSIS °People often do not know they have polyps until their caregiver finds them during a regular checkup. Your caregiver can use 4 tests to check for polyps: °· Digital rectal exam. The caregiver wears gloves and feels inside the rectum. This test would find polyps only in the rectum. °· Barium enema. The caregiver puts a liquid called barium into your rectum before taking X-rays of your colon. Barium makes your colon look white. Polyps are dark, so they are easy to see in the X-ray pictures. °· Sigmoidoscopy. A thin, flexible tube (sigmoidoscope) is placed into your rectum. The sigmoidoscope has a   light and tiny camera in it. The caregiver uses the sigmoidoscope to look at the last third of your colon. °· Colonoscopy. This test is like sigmoidoscopy, but the caregiver looks at the entire colon. This is the most common method for finding and removing polyps. °TREATMENT  °Any polyps will be removed during a sigmoidoscopy or colonoscopy. The polyps are then tested for cancer. °PREVENTION  °To help lower your risk of getting more colon polyps: °· Eat plenty of fruits and vegetables. Avoid eating fatty foods. °· Do not smoke. °· Avoid drinking alcohol. °· Exercise every day. °· Lose weight if recommended by your caregiver. °· Eat plenty of calcium and folate. Foods that are rich in calcium include milk, cheese, and broccoli. Foods that are rich in folate include chickpeas, kidney beans, and  spinach. °HOME CARE INSTRUCTIONS °Keep all follow-up appointments as directed by your caregiver. You may need periodic exams to check for polyps. °SEEK MEDICAL CARE IF: °You notice bleeding during a bowel movement. °  °This information is not intended to replace advice given to you by your health care provider. Make sure you discuss any questions you have with your health care provider. °  °Document Released: 06/06/2004 Document Revised: 10/01/2014 Document Reviewed: 11/20/2011 °Elsevier Interactive Patient Education ©2016 [employer] Inc. ° °

## 2015-09-22 ENCOUNTER — Encounter: Payer: Self-pay | Admitting: Family Medicine

## 2015-09-25 ENCOUNTER — Encounter: Payer: Self-pay | Admitting: Internal Medicine

## 2015-09-27 ENCOUNTER — Encounter: Payer: Self-pay | Admitting: Family Medicine

## 2015-09-27 ENCOUNTER — Ambulatory Visit (INDEPENDENT_AMBULATORY_CARE_PROVIDER_SITE_OTHER): Payer: BLUE CROSS/BLUE SHIELD | Admitting: Family Medicine

## 2015-09-27 VITALS — BP 100/64 | HR 78 | Temp 97.1°F | Resp 18 | Ht 73.0 in | Wt 314.0 lb

## 2015-09-27 DIAGNOSIS — E1100 Type 2 diabetes mellitus with hyperosmolarity without nonketotic hyperglycemic-hyperosmolar coma (NKHHC): Secondary | ICD-10-CM

## 2015-09-27 MED ORDER — ATORVASTATIN CALCIUM 20 MG PO TABS: 20.0000 mg | ORAL_TABLET | Freq: Every day | ORAL | Status: DC

## 2015-09-27 MED ORDER — CANAGLIFLOZIN 300 MG PO TABS: 300.0000 mg | ORAL_TABLET | Freq: Every day | ORAL | Status: DC

## 2015-09-27 NOTE — Progress Notes (Signed)
Subjective:    Patient ID: Steven Henry, male    DOB: 1951-07-05, 65 y.o.   MRN: [id number]  HPI 7/15 Patient is overdue for followup of his diabetes mellitus type 2, his chronic kidney disease, and hypertension.  He is currently taking metformin 1000 mg by mouth twice a day. However his most recent creatinine was 1.59 in September of 2014. This would make this a high risk medication in this patient. He is overdue for a repeat renal function as well as hemoglobin A1c.  Patient is also overdue for a PSA as well as a colonoscopy to screen for colon cancer.  Unfortunately the patient continues to smoke. He continues to have +2 pitting edema in both legs. He is been unsuccessful in losing weight. He continues to 337 pounds.  He is not exercising. He is not checking his blood sugars. He is not following low carbohydrate diet. He is not taking an aspirin. He continues to have easy fatigability due to sleep apnea. His blood pressure is well controlled today.  At that time, my plan was: 1. Type II or unspecified type diabetes mellitus without mention of complication, uncontrolled Check hemoglobin A1c. I will also check renal function. If renal function remains elevated, I would switch the patient from metformin to Januvia.  I have recommended annual diabetic eye exam. Also recommended aspirin 81 mg by mouth daily.  I have strongly recommended smoking cessation. We spent 10 minutes discussing this today - COMPLETE METABOLIC PANEL WITH GFR - Lipid panel - Hemoglobin A1c - Microalbumin, urine  2. Morbid obesity I have recommended diet and exercise to lose substantial weight is means to help treat his swelling, his right-sided heart failure, and his  3. Screening for prostate cancer Check a screening PSA.  And also agrees to go for colonoscopy which I will schedule. - PSA  4. Essential hypertension Blood pressure is well controlled.  08/17/15 Patient has been lost to follow-up. He has not been seen by  any doctor since. Patient discontinued all of his medications almost 1 year ago. He is on nothing for his diabetes or his cholesterol. He is no longer taking any diuretic. He is compliant wearing his CPAP machine. However he continues to smoke. He never went for the colonoscopy I had him scheduled for. He is due for Pneumovax 23 along with a flu shot but he refuses both of these. Overall he is doing well with no complaints. He has lost 26 pounds since his last office visit. However he also reports polyuria and polydipsia which I'm concerned may be a sign his blood sugars are out of control. He states he feels better than he has in years. He denies any complaints or any concerns. He would be willing to get a colonoscopy now. Thankfully the edema in his legs is much better trace pretibial pitting edema. He has chronic venous stasis changes and large varicose veins however in both legs.  AT that time, my plan was: Patient is currently not taking any medication. He did not accurately report this to my nurse. He refuses a flu shot. He refuses a pneumonia vaccine. He will consent for colonoscopy. He would like me to obtain fasting lab work. At the present time his fluid status appears better than it has the last few times and seen the patient and therefore I see no reason for him to start a diuretic right now. His blood pressure is well controlled so I see no reason to start him on a  blood pressure medication. However I'll be interested to see what his A1c in his cholesterol is. I strongly encouraged the patient quit smoking. I will check a PSA. I will schedule the patient for colonoscopy  08/25/15 Please see his labs below.  Hemoglobin A1c was significantly elevated indicating an average blood sugar of 300. Today I had a long discussion with the patient. His diet is very poor. He eats a lot of carbohydrates including sweets, cakes, pies, drinks, junk food, bread, potatoes. He does not exercise at all. He is a very  nice man but he is also noncompliant and therefore I believe he would be a very risky insulin candidate. He tends to skip meals and go a long time without eating. Furthermore he does not check his sugars at all.  At that time, my plan was: I will schedule the patient to meet with a nutritionist. We spent 20 minutes discussing a low carbohydrate diet and ways to control his blood sugars. I want him to start checking his fasting blood sugar and two-hour postprandial sugars every day and write those down and bring them to me in one month. I will start the patient on metformin 1000 mg by mouth twice a day, Glucotrol 10 mg by mouth daily, and Invokana 300 mg poqday and recheck his sugars in one month. If his blood sugars are not improving at that time, I will switch the patient to insulin. Admission on 09/16/2015, Discharged on 09/16/2015  Component Date Value Ref Range Status  . Glucose-Capillary 09/16/2015 122* 65 - 99 mg/dL Final  Office Visit on 08/17/2015  Component Date Value Ref Range Status  . Sodium 08/17/2015 136  135 - 146 mmol/L Final  . Potassium 08/17/2015 4.3  3.5 - 5.3 mmol/L Final  . Chloride 08/17/2015 99  98 - 110 mmol/L Final  . CO2 08/17/2015 26  20 - 31 mmol/L Final  . Glucose, Bld 08/17/2015 284* 70 - 99 mg/dL Final  . BUN 08/17/2015 10  7 - 25 mg/dL Final  . Creat 08/17/2015 0.76  0.70 - 1.25 mg/dL Final  . Total Bilirubin 08/17/2015 0.5  0.2 - 1.2 mg/dL Final  . Alkaline Phosphatase 08/17/2015 57  40 - 115 U/L Final  . AST 08/17/2015 16  10 - 35 U/L Final  . ALT 08/17/2015 18  9 - 46 U/L Final  . Total Protein 08/17/2015 7.3  6.1 - 8.1 g/dL Final  . Albumin 08/17/2015 3.8  3.6 - 5.1 g/dL Final  . Calcium 08/17/2015 9.0  8.6 - 10.3 mg/dL Final  . GFR, Est African American 08/17/2015 >89  >=60 mL/min Final  . GFR, Est Non African American 08/17/2015 >89  >=60 mL/min Final   Comment:   The estimated GFR is a calculation valid for adults (>=52 years old) that uses the CKD-EPI  algorithm to adjust for age and sex. It is   not to be used for children, pregnant women, hospitalized patients,    patients on dialysis, or with rapidly changing kidney function. According to the NKDEP, eGFR >89 is normal, 60-89 shows mild impairment, 30-59 shows moderate impairment, 15-29 shows severe impairment and <15 is ESRD.     Marland Kitchen Hgb A1c MFr Bld 08/17/2015 12.8* <5.7 % Final   Comment:  According to the ADA Clinical Practice Recommendations for 2011, when HbA1c is used as a screening test:     >=6.5%   Diagnostic of Diabetes Mellitus            (if abnormal result is confirmed)   5.7-6.4%   Increased risk of developing Diabetes Mellitus   References:Diagnosis and Classification of Diabetes Mellitus,Diabetes FTDD,2202,54(YHCWC 1):S62-S69 and Standards of Medical Care in         Diabetes - 2011,Diabetes BJSE,8315,17 (Suppl 1):S11-S61.     . Mean Plasma Glucose 08/17/2015 321* <117 mg/dL Final  . Cholesterol 08/17/2015 212* 125 - 200 mg/dL Final  . Triglycerides 08/17/2015 114  <150 mg/dL Final  . HDL 08/17/2015 52  >=40 mg/dL Final  . Total CHOL/HDL Ratio 08/17/2015 4.1  <=5.0 Ratio Final  . VLDL 08/17/2015 23  <30 mg/dL Final  . LDL Cholesterol 08/17/2015 137* <130 mg/dL Final   Comment:   Total Cholesterol/HDL Ratio:CHD Risk                        Coronary Heart Disease Risk Table                                        Men       Women          1/2 Average Risk              3.4        3.3              Average Risk              5.0        4.4           2X Average Risk              9.6        7.1           3X Average Risk             23.4       11.0 Use the calculated Patient Ratio above and the CHD Risk table  to determine the patient's CHD Risk.   . WBC 08/17/2015 8.7  4.0 - 10.5 K/uL Final  . RBC 08/17/2015 5.33  4.22 - 5.81 MIL/uL Final  . Hemoglobin 08/17/2015 17.2* 13.0 - 17.0 g/dL Final  . HCT  08/17/2015 49.1  39.0 - 52.0 % Final  . MCV 08/17/2015 92.1  78.0 - 100.0 fL Final  . MCH 08/17/2015 32.3  26.0 - 34.0 pg Final  . MCHC 08/17/2015 35.0  30.0 - 36.0 g/dL Final  . RDW 08/17/2015 13.4  11.5 - 15.5 % Final  . Platelets 08/17/2015 240  150 - 400 K/uL Final  . MPV 08/17/2015 12.0  8.6 - 12.4 fL Final  . Neutrophils Relative % 08/17/2015 66  43 - 77 % Final  . Neutro Abs 08/17/2015 5.7  1.7 - 7.7 K/uL Final  . Lymphocytes Relative 08/17/2015 24  12 - 46 % Final  . Lymphs Abs 08/17/2015 2.1  0.7 - 4.0 K/uL Final  . Monocytes Relative 08/17/2015 8  3 - 12 % Final  . Monocytes Absolute 08/17/2015 0.7  0.1 - 1.0 K/uL Final  . Eosinophils Relative 08/17/2015 2  0 - 5 % Final  . Eosinophils Absolute 08/17/2015 0.2  0.0 -  0.7 K/uL Final  . Basophils Relative 08/17/2015 0  0 - 1 % Final  . Basophils Absolute 08/17/2015 0.0  0.0 - 0.1 K/uL Final  . Smear Review 08/17/2015 Criteria for review not met   Final  . PSA 08/17/2015 0.91  <=4.00 ng/mL Final   Comment: Test Methodology: ECLIA PSA (Electrochemiluminescence Immunoassay)   For PSA values from 2.5-4.0, particularly in younger men <57 years old, the AUA and NCCN suggest testing for % Free PSA (3515) and evaluation of the rate of increase in PSA (PSA velocity).    09/27/15 Here for follow up.  He reports average blood sugars of 80-150 since starting medications.  He denies any complications from meds. LDL is elevated off lipitor.  Refuses flu shot, HIV screen, and Hep c screen.  Still smoking.  Due for diabetic eye exam. Past Medical History  Diagnosis Date  . Sleep apnea     not on CPAP   . Diabetes mellitus type II, uncontrolled ([place])   . Morbid obesity ([place])   . Hypertension   . Diabetes mellitus without complication ([place])   . CKD (chronic kidney disease), stage III   . Sleep apnea   . Colon polyps    Past Surgical History  Procedure Laterality Date  . Colonoscopy N/A 09/16/2015    Procedure: COLONOSCOPY;  Surgeon:  Daneil Dolin, MD;  Location: [facility];  Service: Endoscopy;  Laterality: N/A;  8:15 AM - pt has c pap   Current Outpatient Prescriptions on File Prior to Visit  Medication Sig Dispense Refill  . canagliflozin (INVOKANA) 300 MG TABS tablet Take 300 mg by mouth daily before breakfast.    . glipiZIDE (GLUCOTROL) 10 MG tablet Take 1 tablet (10 mg total) by mouth daily before breakfast. 30 tablet 5  . metFORMIN (GLUCOPHAGE) 1000 MG tablet Take 1 tablet (1,000 mg total) by mouth 2 (two) times daily. 60 tablet 3  . polyethylene glycol-electrolytes (TRILYTE) 420 G solution Take 4,000 mLs by mouth as directed. 4000 mL 0  . telmisartan-hydrochlorothiazide (MICARDIS HCT) 80-12.5 MG per tablet Take 0.5 tablets by mouth daily. 15 tablet 11   No current facility-administered medications on file prior to visit.   No Known Allergies Social History   Social History  . Marital Status: Divorced    Spouse Name: N/A  . Number of Children: 2  . Years of Education: N/A   Occupational History  . Self Employed    Social History Main Topics  . Smoking status: Current Every Day Smoker -- 0.50 packs/day for 30 years    Types: Cigarettes  . Smokeless tobacco: Never Used  . Alcohol Use: Yes     Comment: occasional  . Drug Use: No  . Sexual Activity: Not on file     Comment: divorced, 2 daughters.  works Psychologist, occupational for fairs, Social research officer, government.   Other Topics Concern  . Not on file   Social History Narrative   Family History  Problem Relation Age of Onset  . Colon cancer Mother   . Diabetes Mother   . Diabetes Sister   . Diabetes Sister   . Diabetes Sister   . Heart disease Mother       Review of Systems  All other systems reviewed and are negative.      Objective:   Physical Exam  Constitutional: He appears well-developed and well-nourished. No distress.  Cardiovascular: Normal rate, regular rhythm, normal heart sounds and intact distal pulses.  Exam reveals no gallop and no  friction  rub.   No murmur heard. Pulmonary/Chest: Effort normal and breath sounds normal. No respiratory distress. He has no wheezes. He has no rales.  Abdominal: Soft. Bowel sounds are normal. He exhibits no distension and no mass. There is no tenderness. There is no rebound and no guarding.  Musculoskeletal: He exhibits edema.  Skin: He is not diaphoretic.          Assessment & Plan:  Uncontrolled type 2 diabetes mellitus with hyperosmolarity without coma, without long-term current use of insulin (HCC) - Plan: atorvastatin (LIPITOR) 20 MG tablet, canagliflozin (INVOKANA) 300 MG TABS tablet  Surprisingly sugars sound much better. Make no changes in medication at this time and recheck hemoglobin A1c in 3 months. Add Lipitor we milligrams by mouth daily and recheck fasting lipid panel in 3 months. Recommended a flu shot as well as HIV and hepatitis C screening all of which patient declined. Recommended smoking cessation. I will schedule the patient for diabetic eye exam. Recheck in 3 months

## 2015-10-17 ENCOUNTER — Other Ambulatory Visit: Payer: Self-pay | Admitting: *Deleted

## 2015-10-28 ENCOUNTER — Ambulatory Visit: Payer: Self-pay | Admitting: Nutrition

## 2015-11-25 ENCOUNTER — Ambulatory Visit: Payer: Self-pay | Admitting: Nutrition

## 2015-12-28 ENCOUNTER — Ambulatory Visit: Payer: Self-pay | Admitting: Nutrition

## 2016-03-18 ENCOUNTER — Other Ambulatory Visit: Payer: Self-pay | Admitting: Family Medicine

## 2016-05-01 ENCOUNTER — Other Ambulatory Visit: Payer: Self-pay | Admitting: Family Medicine

## 2016-05-01 NOTE — Telephone Encounter (Signed)
Refill appropriate and filled per protocol. 

## 2016-07-22 ENCOUNTER — Other Ambulatory Visit: Payer: Self-pay | Admitting: Family Medicine

## 2016-09-10 ENCOUNTER — Other Ambulatory Visit: Payer: Self-pay | Admitting: Family Medicine

## 2016-09-11 ENCOUNTER — Telehealth: Payer: Self-pay | Admitting: *Deleted

## 2016-09-11 MED ORDER — METFORMIN HCL 1000 MG PO TABS: 1000.0000 mg | ORAL_TABLET | Freq: Two times a day (BID) | ORAL | 0 refills | Status: DC

## 2016-09-11 NOTE — Telephone Encounter (Signed)
Received fax from pharmacy requesting PA for Metformin.   Noted prescription sent as OSM (extended release). Patient is taking medication BID, therefore regular release is required.   Contacted pharmacy to change.

## 2016-10-08 ENCOUNTER — Other Ambulatory Visit: Payer: Self-pay | Admitting: Family Medicine

## 2016-10-13 ENCOUNTER — Other Ambulatory Visit: Payer: Self-pay | Admitting: Family Medicine

## 2016-10-13 DIAGNOSIS — E1100 Type 2 diabetes mellitus with hyperosmolarity without nonketotic hyperglycemic-hyperosmolar coma (NKHHC): Secondary | ICD-10-CM

## 2016-10-15 ENCOUNTER — Other Ambulatory Visit: Payer: Self-pay | Admitting: Family Medicine

## 2016-10-15 ENCOUNTER — Encounter: Payer: Self-pay | Admitting: Family Medicine

## 2016-10-15 ENCOUNTER — Ambulatory Visit (INDEPENDENT_AMBULATORY_CARE_PROVIDER_SITE_OTHER): Payer: BLUE CROSS/BLUE SHIELD | Admitting: Family Medicine

## 2016-10-15 VITALS — BP 130/70 | HR 84 | Temp 98.2°F | Resp 20 | Ht 73.0 in | Wt 325.0 lb

## 2016-10-15 DIAGNOSIS — I10 Essential (primary) hypertension: Secondary | ICD-10-CM | POA: Diagnosis not present

## 2016-10-15 DIAGNOSIS — R0989 Other specified symptoms and signs involving the circulatory and respiratory systems: Secondary | ICD-10-CM

## 2016-10-15 DIAGNOSIS — E1100 Type 2 diabetes mellitus with hyperosmolarity without nonketotic hyperglycemic-hyperosmolar coma (NKHHC): Secondary | ICD-10-CM

## 2016-10-15 DIAGNOSIS — E7800 Pure hypercholesterolemia, unspecified: Secondary | ICD-10-CM | POA: Diagnosis not present

## 2016-10-15 LAB — LIPID PANEL
CHOL/HDL RATIO: 4.1 ratio (ref <5.0)
CHOLESTEROL: 201 mg/dL — AB (ref <200)
HDL: 49 mg/dL (ref >40)
LDL Cholesterol: 134 mg/dL — ABNORMAL HIGH (ref <100)
Triglycerides: 90 mg/dL (ref <150)
VLDL: 18 mg/dL (ref <30)

## 2016-10-15 LAB — HEMOGLOBIN A1C
HEMOGLOBIN A1C: 9.2 % — AB (ref <5.7)
MEAN PLASMA GLUCOSE: 217 mg/dL

## 2016-10-15 LAB — CBC WITH DIFFERENTIAL/PLATELET
Basophils Absolute: 76 cells/uL (ref 0–200)
Basophils Relative: 1 %
EOS PCT: 2 %
Eosinophils Absolute: 152 cells/uL (ref 15–500)
HCT: 51.9 % — ABNORMAL HIGH (ref 38.5–50.0)
HEMOGLOBIN: 17.4 g/dL — AB (ref 13.0–17.0)
LYMPHS ABS: 1824 {cells}/uL (ref 850–3900)
Lymphocytes Relative: 24 %
MCH: 32.2 pg (ref 27.0–33.0)
MCHC: 33.5 g/dL (ref 32.0–36.0)
MCV: 95.9 fL (ref 80.0–100.0)
MONOS PCT: 9 %
MPV: 11.5 fL (ref 7.5–12.5)
Monocytes Absolute: 684 cells/uL (ref 200–950)
NEUTROS ABS: 4864 {cells}/uL (ref 1500–7800)
Neutrophils Relative %: 64 %
PLATELETS: 209 10*3/uL (ref 140–400)
RBC: 5.41 MIL/uL (ref 4.20–5.80)
RDW: 13.8 % (ref 11.0–15.0)
WBC: 7.6 10*3/uL (ref 3.8–10.8)

## 2016-10-15 LAB — COMPLETE METABOLIC PANEL WITH GFR
ALBUMIN: 3.9 g/dL (ref 3.6–5.1)
ALK PHOS: 45 U/L (ref 40–115)
ALT: 18 U/L (ref 9–46)
AST: 17 U/L (ref 10–35)
BILIRUBIN TOTAL: 0.4 mg/dL (ref 0.2–1.2)
BUN: 10 mg/dL (ref 7–25)
CALCIUM: 9.2 mg/dL (ref 8.6–10.3)
CO2: 23 mmol/L (ref 20–31)
CREATININE: 0.85 mg/dL (ref 0.70–1.25)
Chloride: 100 mmol/L (ref 98–110)
GFR, Est African American: >89 mL/min (ref >=60)
GFR, Est Non African American: >89 mL/min (ref >=60)
Glucose, Bld: 206 mg/dL — ABNORMAL HIGH (ref 70–99)
POTASSIUM: 4.2 mmol/L (ref 3.5–5.3)
Sodium: 137 mmol/L (ref 135–146)
TOTAL PROTEIN: 7.5 g/dL (ref 6.1–8.1)

## 2016-10-15 MED ORDER — EMPAGLIFLOZIN 25 MG PO TABS: 25.0000 mg | ORAL_TABLET | Freq: Every day | ORAL | 3 refills | Status: DC

## 2016-10-15 NOTE — Progress Notes (Addendum)
Subjective:    Patient ID: Steven Henry, male    DOB: June 02, 1951, 66 y.o.   MRN: [id number]  HPI  7/15 Patient is overdue for followup of his diabetes mellitus type 2, his chronic kidney disease, and hypertension.  He is currently taking metformin 1000 mg by mouth twice a day. However his most recent creatinine was 1.59 in September of 2014. This would make this a high risk medication in this patient. He is overdue for a repeat renal function as well as hemoglobin A1c.  Patient is also overdue for a PSA as well as a colonoscopy to screen for colon cancer.  Unfortunately the patient continues to smoke. He continues to have +2 pitting edema in both legs. He is been unsuccessful in losing weight. He continues to 337 pounds.  He is not exercising. He is not checking his blood sugars. He is not following low carbohydrate diet. He is not taking an aspirin. He continues to have easy fatigability due to sleep apnea. His blood pressure is well controlled today.  At that time, my plan was: 1. Type II or unspecified type diabetes mellitus without mention of complication, uncontrolled Check hemoglobin A1c. I will also check renal function. If renal function remains elevated, I would switch the patient from metformin to Januvia.  I have recommended annual diabetic eye exam. Also recommended aspirin 81 mg by mouth daily.  I have strongly recommended smoking cessation. We spent 10 minutes discussing this today - COMPLETE METABOLIC PANEL WITH GFR - Lipid panel - Hemoglobin A1c - Microalbumin, urine  2. Morbid obesity I have recommended diet and exercise to lose substantial weight is means to help treat his swelling, his right-sided heart failure, and his  3. Screening for prostate cancer Check a screening PSA.  And also agrees to go for colonoscopy which I will schedule. - PSA  4. Essential hypertension Blood pressure is well controlled.  08/17/15 Patient has been lost to follow-up. He has not been seen  by any doctor since. Patient discontinued all of his medications almost 1 year ago. He is on nothing for his diabetes or his cholesterol. He is no longer taking any diuretic. He is compliant wearing his CPAP machine. However he continues to smoke. He never went for the colonoscopy I had him scheduled for. He is due for Pneumovax 23 along with a flu shot but he refuses both of these. Overall he is doing well with no complaints. He has lost 26 pounds since his last office visit. However he also reports polyuria and polydipsia which I'm concerned may be a sign his blood sugars are out of control. He states he feels better than he has in years. He denies any complaints or any concerns. He would be willing to get a colonoscopy now. Thankfully the edema in his legs is much better trace pretibial pitting edema. He has chronic venous stasis changes and large varicose veins however in both legs.  AT that time, my plan was: Patient is currently not taking any medication. He did not accurately report this to my nurse. He refuses a flu shot. He refuses a pneumonia vaccine. He will consent for colonoscopy. He would like me to obtain fasting lab work. At the present time his fluid status appears better than it has the last few times and seen the patient and therefore I see no reason for him to start a diuretic right now. His blood pressure is well controlled so I see no reason to start him on  a blood pressure medication. However I'll be interested to see what his A1c in his cholesterol is. I strongly encouraged the patient quit smoking. I will check a PSA. I will schedule the patient for colonoscopy  08/25/15 Please see his labs below.  Hemoglobin A1c was significantly elevated indicating an average blood sugar of 300. Today I had a long discussion with the patient. His diet is very poor. He eats a lot of carbohydrates including sweets, cakes, pies, drinks, junk food, bread, potatoes. He does not exercise at all. He is a very  nice man but he is also noncompliant and therefore I believe he would be a very risky insulin candidate. He tends to skip meals and go a long time without eating. Furthermore he does not check his sugars at all.  At that time, my plan was: I will schedule the patient to meet with a nutritionist. We spent 20 minutes discussing a low carbohydrate diet and ways to control his blood sugars. I want him to start checking his fasting blood sugar and two-hour postprandial sugars every day and write those down and bring them to me in one month. I will start the patient on metformin 1000 mg by mouth twice a day, Glucotrol 10 mg by mouth daily, and Invokana 300 mg poqday and recheck his sugars in one month. If his blood sugars are not improving at that time, I will switch the patient to insulin. No visits with results within 2 Month(s) from this visit.  Latest known visit with results is:  Admission on 09/16/2015, Discharged on 09/16/2015  Component Date Value Ref Range Status  . Glucose-Capillary 09/16/2015 122* 65 - 99 mg/dL Final   09/27/15 Here for follow up.  He reports average blood sugars of 80-150 since starting medications.  He denies any complications from meds. LDL is elevated off lipitor.  Refuses flu shot, HIV screen, and Hep c screen.  Still smoking.  Due for diabetic eye exam.  At that time, my plan was: Surprisingly sugars sound much better. Make no changes in medication at this time and recheck hemoglobin A1c in 3 months. Add Lipitor we milligrams by mouth daily and recheck fasting lipid panel in 3 months. Recommended a flu shot as well as HIV and hepatitis C screening all of which patient declined. Recommended smoking cessation. I will schedule the patient for diabetic eye exam. Recheck in 3 months  10/15/16 Patient never followed up.  The patient discontinued his blood pressure medication as well as the Lipitor because he felt that it was making him dizzy. He states these been checking his blood  pressure and has remained well controlled even off the medication. Unfortunately he continues to smoke. He states that as long as he takes Invokana, his blood sugars run between 120 and 130. He states that he stops taking the Invokana,, his blood pressures range between 170 and 200.  He also reports a cold sensation in his feet. He denies any numbness or tingling or pain. However on foot exam today he has very weak pulses bilaterally. His feet are cool to the touch. There is pitting edema and obvious venous insufficiency with chronic venous stasis changes. There are no sores patella pre-ulcerative calluses on the lateral aspects of his MTP joints (fifth). He refuses a flu shot. He refuses Pneumovax Past Medical History:  Diagnosis Date  . CKD (chronic kidney disease), stage III   . Colon polyps   . Diabetes mellitus type II, uncontrolled ([place])   . Diabetes  mellitus without complication ([place])   . Hypertension   . Morbid obesity ([place])   . Sleep apnea    not on CPAP   . Sleep apnea    Past Surgical History:  Procedure Laterality Date  . COLONOSCOPY N/A 09/16/2015   Procedure: COLONOSCOPY;  Surgeon: Daneil Dolin, MD;  [facility];  Service: Endoscopy;  Laterality: N/A;  8:15 AM - pt has c pap   Current Outpatient Prescriptions on File Prior to Visit  Medication Sig Dispense Refill  . atorvastatin (LIPITOR) 20 MG tablet Take 1 tablet (20 mg total) by mouth daily. 90 tablet 3  . canagliflozin (INVOKANA) 300 MG TABS tablet Take 300 mg by mouth daily before breakfast. 30 tablet 11  . glipiZIDE (GLUCOTROL) 10 MG tablet TAKE 1 TABLET BY MOUTH EVERY DAY BEFORE BREAKFAST 30 tablet 0  . metFORMIN (GLUCOPHAGE) 1000 MG tablet Take 1 tablet (1,000 mg total) by mouth 2 (two) times daily with a meal. 60 tablet 0  . polyethylene glycol-electrolytes (TRILYTE) 420 G solution Take 4,000 mLs by mouth as directed. 4000 mL 0  . telmisartan-hydrochlorothiazide (MICARDIS HCT) 80-12.5 MG per tablet Take  0.5 tablets by mouth daily. (Patient not taking: Reported on 09/27/2015) 15 tablet 11   No current facility-administered medications on file prior to visit.    No Known Allergies Social History   Social History  . Marital status: Divorced    Spouse name: N/A  . Number of children: 2  . Years of education: N/A   Occupational History  . Self Employed    Social History Main Topics  . Smoking status: Current Every Day Smoker    Packs/day: 0.50    Years: 30.00    Types: Cigarettes  . Smokeless tobacco: Never Used  . Alcohol use Yes     Comment: occasional  . Drug use: No  . Sexual activity: Not on file     Comment: divorced, 2 daughters.  works Psychologist, occupational for fairs, Social research officer, government.   Other Topics Concern  . Not on file   Social History Narrative  . No narrative on file   Family History  Problem Relation Age of Onset  . Colon cancer Mother   . Diabetes Mother   . Diabetes Sister   . Diabetes Sister   . Diabetes Sister   . Heart disease Mother       Review of Systems  All other systems reviewed and are negative.      Objective:   Physical Exam  Constitutional: He appears well-developed and well-nourished. No distress.  Cardiovascular: Normal rate, regular rhythm, normal heart sounds and intact distal pulses.  Exam reveals no gallop and no friction rub.   No murmur heard. Pulmonary/Chest: Effort normal and breath sounds normal. No respiratory distress. He has no wheezes. He has no rales.  Abdominal: Soft. Bowel sounds are normal. He exhibits no distension and no mass. There is no tenderness. There is no rebound and no guarding.  Musculoskeletal: He exhibits edema.  Skin: He is not diaphoretic.          Assessment & Plan:  Uncontrolled type 2 diabetes mellitus with hyperosmolarity without coma, without long-term current use of insulin (HCC) - Plan: COMPLETE METABOLIC PANEL WITH GFR, Lipid panel, Hemoglobin A1c, Microalbumin, urine, CBC with  Differential/Platelet  Essential hypertension - Plan: COMPLETE METABOLIC PANEL WITH GFR, Lipid panel  Morbid obesity due to excess calories (HCC)  Pure hypercholesterolemia - Plan: COMPLETE METABOLIC PANEL WITH GFR, Lipid panel  Patient politely declines the flu shot as well as the pneumonia vaccine. I am concerned he may have peripheral vascular disease and therefore I'll schedule the patient for arterial Dopplers with ABIs. His blood pressure today is well controlled with his microalbumin is elevated, I would like to start him back on an angiotensin receptor blocker for renal protection. I'm concerned the patient has peripheral vascular disease and therefore will check his cholesterol. If his LDL is below 70, I would strongly recommend resuming some type of statin medication. I encouraged the patient to start taking an aspirin 81 mg per day which he is not doing at the present time. Also check a hemoglobin A1c. However given the recent reports on Invokana, and amputations as well as the diminished pulses in both feet, I recommended discontinuing Invokana replacing with Jardianvce 25 mg a day.  Recheck in 3 months  Addendum (11/01/16) Office note was incorrect. The patient has been diagnosed with obstructive sleep apnea since 2014 and has been compliant with his CPAP since that time

## 2016-10-16 LAB — MICROALBUMIN, URINE: MICROALB UR: 36.4 mg/dL

## 2016-10-17 ENCOUNTER — Telehealth: Payer: Self-pay | Admitting: Family Medicine

## 2016-10-17 DIAGNOSIS — E1100 Type 2 diabetes mellitus with hyperosmolarity without nonketotic hyperglycemic-hyperosmolar coma (NKHHC): Secondary | ICD-10-CM

## 2016-10-17 MED ORDER — LOSARTAN POTASSIUM 50 MG PO TABS: 50.0000 mg | ORAL_TABLET | Freq: Every day | ORAL | 3 refills | Status: DC

## 2016-10-17 MED ORDER — ATORVASTATIN CALCIUM 20 MG PO TABS: 20.0000 mg | ORAL_TABLET | Freq: Every day | ORAL | 0 refills | Status: DC

## 2016-10-17 NOTE — Telephone Encounter (Signed)
New meds discussed with patient yesterday when called about labs not at pharmacy.  Reviewed lab results again and Lipitor and Losartan sent to [facility] per pt request.  Victoza not sent, as per discussion with Lovey Newcomer, he was going to be more diligent with oral meds and wait for 3 mth visit.  Also said BS last evening was 139.  Pt did not relay being on any type of blood sugar monitoring.  Told him to check every morning fasting and then once daily 2 hrs after lunch or dinner.  Keep log to bring to next visits.  Call us if sugars are over 250 or under 90.  He acknowledged understanding.

## 2016-10-18 ENCOUNTER — Other Ambulatory Visit: Payer: Self-pay | Admitting: Family Medicine

## 2016-10-18 DIAGNOSIS — R0989 Other specified symptoms and signs involving the circulatory and respiratory systems: Secondary | ICD-10-CM

## 2016-10-18 DIAGNOSIS — E1100 Type 2 diabetes mellitus with hyperosmolarity without nonketotic hyperglycemic-hyperosmolar coma (NKHHC): Secondary | ICD-10-CM

## 2016-10-18 MED ORDER — ATORVASTATIN CALCIUM 20 MG PO TABS: 20.0000 mg | ORAL_TABLET | Freq: Every day | ORAL | 0 refills | Status: DC

## 2016-10-18 MED ORDER — INSULIN PEN NEEDLE 31G X 5 MM MISC: 5 refills | Status: DC

## 2016-10-18 MED ORDER — LIRAGLUTIDE 18 MG/3ML ~~LOC~~ SOPN: 1.2000 mg | PEN_INJECTOR | Freq: Every day | SUBCUTANEOUS | 3 refills | Status: DC

## 2016-10-18 MED ORDER — LOSARTAN POTASSIUM 50 MG PO TABS: 50.0000 mg | ORAL_TABLET | Freq: Every day | ORAL | 3 refills | Status: DC

## 2016-10-18 NOTE — Addendum Note (Signed)
Addended by: Shary Decamp B on: 10/18/2016 04:22 PM   Modules accepted: Orders

## 2016-10-30 ENCOUNTER — Telehealth: Payer: Self-pay | Admitting: Family Medicine

## 2016-10-30 NOTE — Telephone Encounter (Signed)
CB# [phone] Patient states he needs our office to contact [facility] about getting him a new c-pap machine. He states the one he currently has no longer is working. He states that [facility] states they need an order.

## 2016-10-31 NOTE — Telephone Encounter (Signed)
Can you go into his chart and addend in dx portion that he has OSA and is on cpap - it states that he is not and he has been on cpap since 2014. Thanks

## 2016-11-01 NOTE — Telephone Encounter (Signed)
done

## 2016-11-01 NOTE — Telephone Encounter (Signed)
Message sent to Steven Henry at [facility] to get his CPAP replaced.

## 2016-11-03 ENCOUNTER — Other Ambulatory Visit: Payer: Self-pay | Admitting: Family Medicine

## 2016-11-09 ENCOUNTER — Ambulatory Visit (HOSPITAL_COMMUNITY)
Admission: RE | Admit: 2016-11-09 | Discharge: 2016-11-09 | Disposition: A | Discharge status: Home / Self Care | Payer: Medicare Other | Source: Ambulatory Visit | Attending: Vascular Surgery | Admitting: Vascular Surgery

## 2016-11-09 ENCOUNTER — Ambulatory Visit (INDEPENDENT_AMBULATORY_CARE_PROVIDER_SITE_OTHER)
Admission: RE | Admit: 2016-11-09 | Discharge: 2016-11-09 | Disposition: A | Discharge status: Home / Self Care | Payer: Medicare Other | Source: Ambulatory Visit | Attending: Vascular Surgery | Admitting: Vascular Surgery

## 2016-11-09 DIAGNOSIS — R0989 Other specified symptoms and signs involving the circulatory and respiratory systems: Secondary | ICD-10-CM | POA: Diagnosis not present

## 2016-11-09 DIAGNOSIS — I743 Embolism and thrombosis of arteries of the lower extremities: Secondary | ICD-10-CM | POA: Diagnosis not present

## 2016-11-09 DIAGNOSIS — R9439 Abnormal result of other cardiovascular function study: Secondary | ICD-10-CM | POA: Diagnosis not present

## 2016-11-09 LAB — VAS US LOWER EXTREMITY ARTERIAL DUPLEX
LATIBDISTSYS: -31 cm/s
LSFPPSV: -31 cm/s
Left super femoral dist sys PSV: -30 cm/s
Left super femoral mid sys PSV: -37 cm/s
RIGHT ANT DIST TIBAL SYS PSV: 54 cm/s
RIGHT POST TIB DIST SYS: 62 cm/s
RPERPSV: -53 cm/s
RSFDPSV: -106 cm/s
Right super femoral mid sys PSV: -135 cm/s
Right super femoral prox sys PSV: -89 cm/s
left post tibial dist sys: -43 cm/s

## 2016-11-21 ENCOUNTER — Telehealth: Payer: Self-pay | Admitting: Family Medicine

## 2016-11-21 DIAGNOSIS — I70201 Unspecified atherosclerosis of native arteries of extremities, right leg: Secondary | ICD-10-CM

## 2016-11-21 NOTE — Telephone Encounter (Signed)
Pt is calling to get results of the Duplex US on legs.

## 2016-11-22 NOTE — Telephone Encounter (Signed)
LMTRC

## 2016-11-22 NOTE — Telephone Encounter (Signed)
They see a 75-99 % blockage in the right femoral artery.  Recommend referral to vascular surgery for evaluation.

## 2016-11-23 NOTE — Telephone Encounter (Signed)
Patient aware of results and of providers recommendations - referral placed

## 2016-11-27 ENCOUNTER — Encounter: Payer: Self-pay | Admitting: Family Medicine

## 2016-12-06 ENCOUNTER — Encounter: Payer: Self-pay | Admitting: Vascular Surgery

## 2016-12-19 ENCOUNTER — Encounter: Payer: Self-pay | Admitting: Vascular Surgery

## 2016-12-19 ENCOUNTER — Ambulatory Visit (INDEPENDENT_AMBULATORY_CARE_PROVIDER_SITE_OTHER): Payer: Medicare Other | Admitting: Vascular Surgery

## 2016-12-19 VITALS — BP 122/74 | HR 78 | Temp 97.2°F | Resp 18 | Ht 73.0 in | Wt 324.0 lb

## 2016-12-19 DIAGNOSIS — I872 Venous insufficiency (chronic) (peripheral): Secondary | ICD-10-CM | POA: Diagnosis not present

## 2016-12-19 DIAGNOSIS — I739 Peripheral vascular disease, unspecified: Secondary | ICD-10-CM

## 2016-12-19 NOTE — Progress Notes (Signed)
Patient ID: Steven Henry, male   DOB: 04-24-1951, 66 y.o.   MRN: [id number]  Reason for Consult: New Evaluation   Referred by [name], MD  Subjective:     HPI:  Steven Henry is a 66 y.o. male sent by Dr. 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 From a lower extremity standpoint he does not have any symptoms of cramping with walking denies rest pain and does not have tissue loss or ulceration. He does state that the services of his feet are numb and he does have diabetes with possible neuropathy he states. He is a chronic long-term smoker and carries a very high fat diet. He does have recent weight gain that he states has become uncontrolled over time. He does not have fevers chills weight loss or constitutional symptoms. He has only lower extremity complaint of swelling and heaviness in his bilateral lower extremities. He states the swelling was controlled by Lasix but he has not been taking that. Heaviness associated with walking. He does not wear compression stockings has tried tighter socks in the past. He denies frank pain in his legs. He does not take aspirin at this time but does take a statin drug for cholesterol.  Past Medical History:  Diagnosis Date  . CKD (chronic kidney disease), stage III   . Colon polyps   . Diabetes mellitus type II, uncontrolled ([place])   . Diabetes mellitus without complication ([place])   . Hypertension   . Morbid obesity ([place])   . PVD (peripheral vascular disease) (HCC)    right femoral artery  . Sleep apnea    not on CPAP   . Sleep apnea    Family History  Problem Relation Age of Onset  . Colon cancer Mother   . Diabetes Mother   . Diabetes Sister   . Diabetes Sister   . Diabetes Sister   . Heart disease Mother    Past Surgical History:  Procedure Laterality Date  . COLONOSCOPY N/A 09/16/2015   Procedure: COLONOSCOPY;  Surgeon: Daneil Dolin, MD;   [facility];  Service: Endoscopy;  Laterality: N/A;  8:15 AM - pt has c pap    Short Social History:  Social History  Substance Use Topics  . Smoking status: Current Every Day Smoker    Packs/day: 0.50    Years: 30.00    Types: Cigarettes  . Smokeless tobacco: Never Used  . Alcohol use Yes     Comment: occasional    No Known Allergies  Current Outpatient Prescriptions  Medication Sig Dispense Refill  . atorvastatin (LIPITOR) 20 MG tablet Take 1 tablet (20 mg total) by mouth daily at 6 PM. 90 tablet 0  . empagliflozin (JARDIANCE) 25 MG TABS tablet Take 25 mg by mouth daily. 30 tablet 3  . glipiZIDE (GLUCOTROL) 10 MG tablet TAKE 1 TABLET BY MOUTH EVERY DAY BEFORE BREAKFAST 30 tablet 3  . Insulin Pen Needle 31G X 5 MM MISC Use daily with Victoza 100 each 5  . losartan (COZAAR) 50 MG tablet Take 1 tablet (50 mg total) by mouth daily. 90 tablet 3  . metFORMIN (GLUCOPHAGE) 1000 MG tablet Take 1 tablet (1,000 mg total) by mouth 2 (two) times daily with a meal. 60 tablet 0  . metFORMIN (GLUCOPHAGE) 1000 MG tablet TAKE 1 TABLET BY MOUTH TWICE DAILY 60 tablet 3  . INVOKANA 300 MG TABS tablet TAKE 1 TABLET BY MOUTH  DAILY BEFORE BREAKFAST (Patient not taking: Reported on 12/19/2016) 30 tablet 11  . liraglutide 18 MG/3ML SOPN Inject 0.2 mLs (1.2 mg total) into the skin daily. (Patient not taking: Reported on 12/19/2016) 6 pen 3   No current facility-administered medications for this visit.     Review of Systems  Constitutional: Positive for unexpected weight change.  Eyes: Eyes negative.  Respiratory: Respiratory negative.  Cardiovascular: Positive for leg swelling.  GI: Gastrointestinal negative.  Musculoskeletal: Musculoskeletal negative.  Skin: Skin negative.  Neurological: Neurological negative. Hematologic: Hematologic/lymphatic negative.  Psychiatric: Psychiatric negative.        Objective:  Objective   Vitals:   12/19/16 1105  BP: 122/74  Pulse: 78  Resp: 18    Temp: 97.2 F (36.2 C)  SpO2: 94%  Weight: (!) 324 lb (147 kg)  Height: 6\' 1"  (1.854 m)   Body mass index is 42.75 kg/m.  Physical Exam  Constitutional: He is oriented to person, place, and time.  obese  HENT:  Head: Normocephalic.  Eyes: Pupils are equal, round, and reactive to light.  Neck: Normal range of motion.  Cardiovascular: Normal rate.   Pulses:      Carotid pulses are 2+ on the right side, and 2+ on the left side.      Radial pulses are 2+ on the right side, and 2+ on the left side.       Popliteal pulses are 2+ on the right side, and 2+ on the left side.  Pulmonary/Chest: Effort normal.  Abdominal: Soft. He exhibits no distension and no mass. There is no guarding.  Musculoskeletal: Normal range of motion. He exhibits edema.  Bilateral legs with varicose veins and spider veins Evidence of C4 disease bilaterally  Lymphadenopathy:    He has no cervical adenopathy.  Neurological: He is alert and oriented to person, place, and time.  Skin: Skin is warm and dry.  Psychiatric: He has a normal mood and affect. His behavior is normal. Judgment and thought content normal.    Data: ABIs right 0.91 left 0.85.  Duplex right lower extremity demonstrates 75-99% stenosis of the mid distal portion of the superficial femoral artery on the right.      Assessment/Plan:     66 year old white male with long history of smoking and diabetes and obesity with recent weight gain presents for evaluation of his bilateral lower extremities. He does have a known stenosis of his distal SFA by duplex and ABIs are mostly preserved and he has signals at his DP and PT vessels bilaterally without palpable pulses. He also has a component of venous disease that is C4 by clinical exam. I have recommended him to first quit smoking second lose weight and third take aspirin and statin drugs. Also he was given information to wear 20-30 mmHg thigh-high stockings for his venous disease and we can follow him  up in 3 months with lower extremity venous reflux studies. We will also check his weight today that day and I will hope that he has quit smoking by that time although he does seem quite reluctant on today's exam. We had a frank discussion that his weight and his smoking could lead to very bad wound issues at his legs from an arterial or venous standpoint and that it would be very hard for [place] to treat and that prevention would be the best way to treat this. He demonstrates good understanding we will see him in 3 months.     [name] MD  [facility]

## 2016-12-20 NOTE — Addendum Note (Signed)
Addended by: Lianne Cure A on: 12/20/2016 09:15 AM   Modules accepted: Orders

## 2016-12-23 DIAGNOSIS — I10 Essential (primary) hypertension: Secondary | ICD-10-CM | POA: Diagnosis not present

## 2016-12-23 DIAGNOSIS — G4733 Obstructive sleep apnea (adult) (pediatric): Secondary | ICD-10-CM | POA: Diagnosis not present

## 2016-12-24 ENCOUNTER — Ambulatory Visit: Payer: BLUE CROSS/BLUE SHIELD | Admitting: Family Medicine

## 2017-01-14 ENCOUNTER — Ambulatory Visit (INDEPENDENT_AMBULATORY_CARE_PROVIDER_SITE_OTHER): Payer: Medicare Other | Admitting: Family Medicine

## 2017-01-14 ENCOUNTER — Encounter: Payer: Self-pay | Admitting: Family Medicine

## 2017-01-14 VITALS — BP 128/72 | HR 72 | Temp 98.0°F | Resp 18 | Wt 324.0 lb

## 2017-01-14 DIAGNOSIS — E7800 Pure hypercholesterolemia, unspecified: Secondary | ICD-10-CM | POA: Diagnosis not present

## 2017-01-14 DIAGNOSIS — E1100 Type 2 diabetes mellitus with hyperosmolarity without nonketotic hyperglycemic-hyperosmolar coma (NKHHC): Secondary | ICD-10-CM | POA: Diagnosis not present

## 2017-01-14 DIAGNOSIS — I70201 Unspecified atherosclerosis of native arteries of extremities, right leg: Secondary | ICD-10-CM | POA: Diagnosis not present

## 2017-01-14 DIAGNOSIS — I10 Essential (primary) hypertension: Secondary | ICD-10-CM | POA: Diagnosis not present

## 2017-01-14 LAB — CBC WITH DIFFERENTIAL/PLATELET
BASOS ABS: 0 {cells}/uL (ref 0–200)
Basophils Relative: 0 %
EOS PCT: 2 %
Eosinophils Absolute: 158 cells/uL (ref 15–500)
HCT: 50.6 % — ABNORMAL HIGH (ref 38.5–50.0)
Hemoglobin: 16.6 g/dL (ref 13.0–17.0)
Lymphocytes Relative: 24 %
Lymphs Abs: 1896 cells/uL (ref 850–3900)
MCH: 31.2 pg (ref 27.0–33.0)
MCHC: 32.8 g/dL (ref 32.0–36.0)
MCV: 95.1 fL (ref 80.0–100.0)
MONOS PCT: 10 %
MPV: 11.7 fL (ref 7.5–12.5)
Monocytes Absolute: 790 cells/uL (ref 200–950)
NEUTROS ABS: 5056 {cells}/uL (ref 1500–7800)
NEUTROS PCT: 64 %
Platelets: 217 10*3/uL (ref 140–400)
RBC: 5.32 MIL/uL (ref 4.20–5.80)
RDW: 13.8 % (ref 11.0–15.0)
WBC: 7.9 10*3/uL (ref 3.8–10.8)

## 2017-01-14 LAB — COMPLETE METABOLIC PANEL WITH GFR
ALT: 13 U/L (ref 9–46)
AST: 12 U/L (ref 10–35)
Albumin: 4.1 g/dL (ref 3.6–5.1)
Alkaline Phosphatase: 47 U/L (ref 40–115)
BUN: 12 mg/dL (ref 7–25)
CO2: 26 mmol/L (ref 20–31)
Calcium: 9.5 mg/dL (ref 8.6–10.3)
Chloride: 103 mmol/L (ref 98–110)
Creat: 0.92 mg/dL (ref 0.70–1.25)
GFR, EST NON AFRICAN AMERICAN: 87 mL/min (ref >=60)
GFR, Est African American: >89 mL/min (ref >=60)
GLUCOSE: 151 mg/dL — AB (ref 70–99)
POTASSIUM: 4.4 mmol/L (ref 3.5–5.3)
SODIUM: 140 mmol/L (ref 135–146)
Total Bilirubin: 0.3 mg/dL (ref 0.2–1.2)
Total Protein: 7.4 g/dL (ref 6.1–8.1)

## 2017-01-14 LAB — LIPID PANEL
CHOL/HDL RATIO: 3.2 ratio (ref <5.0)
Cholesterol: 146 mg/dL (ref <200)
HDL: 46 mg/dL (ref >40)
LDL CALC: 83 mg/dL (ref <100)
Triglycerides: 86 mg/dL (ref <150)
VLDL: 17 mg/dL (ref <30)

## 2017-01-14 NOTE — Progress Notes (Signed)
Subjective:    Patient ID: Steven Henry, male    DOB: June 02, 1951, 66 y.o.   MRN: [id number]  HPI  7/15 Patient is overdue for followup of his diabetes mellitus type 2, his chronic kidney disease, and hypertension.  He is currently taking metformin 1000 mg by mouth twice a day. However his most recent creatinine was 1.59 in September of 2014. This would make this a high risk medication in this patient. He is overdue for a repeat renal function as well as hemoglobin A1c.  Patient is also overdue for a PSA as well as a colonoscopy to screen for colon cancer.  Unfortunately the patient continues to smoke. He continues to have +2 pitting edema in both legs. He is been unsuccessful in losing weight. He continues to 337 pounds.  He is not exercising. He is not checking his blood sugars. He is not following low carbohydrate diet. He is not taking an aspirin. He continues to have easy fatigability due to sleep apnea. His blood pressure is well controlled today.  At that time, my plan was: 1. Type II or unspecified type diabetes mellitus without mention of complication, uncontrolled Check hemoglobin A1c. I will also check renal function. If renal function remains elevated, I would switch the patient from metformin to Januvia.  I have recommended annual diabetic eye exam. Also recommended aspirin 81 mg by mouth daily.  I have strongly recommended smoking cessation. We spent 10 minutes discussing this today - COMPLETE METABOLIC PANEL WITH GFR - Lipid panel - Hemoglobin A1c - Microalbumin, urine  2. Morbid obesity I have recommended diet and exercise to lose substantial weight is means to help treat his swelling, his right-sided heart failure, and his  3. Screening for prostate cancer Check a screening PSA.  And also agrees to go for colonoscopy which I will schedule. - PSA  4. Essential hypertension Blood pressure is well controlled.  08/17/15 Patient has been lost to follow-up. He has not been seen  by any doctor since. Patient discontinued all of his medications almost 1 year ago. He is on nothing for his diabetes or his cholesterol. He is no longer taking any diuretic. He is compliant wearing his CPAP machine. However he continues to smoke. He never went for the colonoscopy I had him scheduled for. He is due for Pneumovax 23 along with a flu shot but he refuses both of these. Overall he is doing well with no complaints. He has lost 26 pounds since his last office visit. However he also reports polyuria and polydipsia which I'm concerned may be a sign his blood sugars are out of control. He states he feels better than he has in years. He denies any complaints or any concerns. He would be willing to get a colonoscopy now. Thankfully the edema in his legs is much better trace pretibial pitting edema. He has chronic venous stasis changes and large varicose veins however in both legs.  AT that time, my plan was: Patient is currently not taking any medication. He did not accurately report this to my nurse. He refuses a flu shot. He refuses a pneumonia vaccine. He will consent for colonoscopy. He would like me to obtain fasting lab work. At the present time his fluid status appears better than it has the last few times and seen the patient and therefore I see no reason for him to start a diuretic right now. His blood pressure is well controlled so I see no reason to start him on  a blood pressure medication. However I'll be interested to see what his A1c in his cholesterol is. I strongly encouraged the patient quit smoking. I will check a PSA. I will schedule the patient for colonoscopy  08/25/15 Please see his labs below.  Hemoglobin A1c was significantly elevated indicating an average blood sugar of 300. Today I had a long discussion with the patient. His diet is very poor. He eats a lot of carbohydrates including sweets, cakes, pies, drinks, junk food, bread, potatoes. He does not exercise at all. He is a very  nice man but he is also noncompliant and therefore I believe he would be a very risky insulin candidate. He tends to skip meals and go a long time without eating. Furthermore he does not check his sugars at all.  At that time, my plan was: I will schedule the patient to meet with a nutritionist. We spent 20 minutes discussing a low carbohydrate diet and ways to control his blood sugars. I want him to start checking his fasting blood sugar and two-hour postprandial sugars every day and write those down and bring them to me in one month. I will start the patient on metformin 1000 mg by mouth twice a day, Glucotrol 10 mg by mouth daily, and Invokana 300 mg poqday and recheck his sugars in one month. If his blood sugars are not improving at that time, I will switch the patient to insulin. No visits with results within 2 Month(s) from this visit.  Latest known visit with results is:  Hospital Outpatient Visit on 11/09/2016  Component Date Value Ref Range Status  . Left super femoral prox sys PSV 11/09/2016 -31  cm/s Final  . Left super femoral mid sys PSV 11/09/2016 -37  cm/s Final  . Left super femoral dist sys PSV 11/09/2016 -30  cm/s Final  . Right super femoral prox sys PSV 11/09/2016 -89  cm/s Final  . Right super femoral mid sys PSV 11/09/2016 -135  cm/s Final  . Right super femoral dist sys PSV 11/09/2016 -106  cm/s Final  . Right peroneal sys PSV 11/09/2016 -53  cm/s Final  . Left ant tibial distal sys 11/09/2016 -31  cm/s Final  . left post tibial dist sys 11/09/2016 -43  cm/s Final  . RIGHT ANT DIST TIBAL SYS PSV 11/09/2016 54  cm/s Final  . RIGHT POST TIB DIST SYS 11/09/2016 62  cm/s Final   09/27/15 Here for follow up.  He reports average blood sugars of 80-150 since starting medications.  He denies any complications from meds. LDL is elevated off lipitor.  Refuses flu shot, HIV screen, and Hep c screen.  Still smoking.  Due for diabetic eye exam.  At that time, my plan was: Surprisingly  sugars sound much better. Make no changes in medication at this time and recheck hemoglobin A1c in 3 months. Add Lipitor we milligrams by mouth daily and recheck fasting lipid panel in 3 months. Recommended a flu shot as well as HIV and hepatitis C screening all of which patient declined. Recommended smoking cessation. I will schedule the patient for diabetic eye exam. Recheck in 3 months  10/15/16 Patient never followed up.  The patient discontinued his blood pressure medication as well as the Lipitor because he felt that it was making him dizzy. He states these been checking his blood pressure and has remained well controlled even off the medication. Unfortunately he continues to smoke. He states that as long as he takes Invokana, his blood  sugars run between 120 and 130. He states that he stops taking the Invokana,, his blood pressures range between 170 and 200.  He also reports a cold sensation in his feet. He denies any numbness or tingling or pain. However on foot exam today he has very weak pulses bilaterally. His feet are cool to the touch. There is pitting edema and obvious venous insufficiency with chronic venous stasis changes. There are no sores patella pre-ulcerative calluses on the lateral aspects of his MTP joints (fifth). He refuses a flu shot. He refuses Pneumovax.    AT that time, my plan was: patient politely declines the flu shot as well as the pneumonia vaccine. I am concerned he may have peripheral vascular disease and therefore I'll schedule the patient for arterial Dopplers with ABIs. His blood pressure today is well controlled with his microalbumin is elevated, I would like to start him back on an angiotensin receptor blocker for renal protection. I'm concerned the patient has peripheral vascular disease and therefore will check his cholesterol. If his LDL is below 70, I would strongly recommend resuming some type of statin medication. I encouraged the patient to start taking an aspirin  81 mg per day which he is not doing at the present time. Also check a hemoglobin A1c. However given the recent reports on Invokana, and amputations as well as the diminished pulses in both feet, I recommended discontinuing Invokana replacing with Jardianvce 25 mg a day.  Recheck in 3 months  Addendum (11/01/16) Office note was incorrect. The patient has been diagnosed with obstructive sleep apnea since 2014 and has been compliant with his CPAP since that time  01/14/17 A1c was 9.2 in January and LDL was >130.  Patient agreed to Losartan and lipitor but declined victoza in favor of trying to be more compliant with medication.  He states that he is wearing his CPAP every night. He has no digital report for me to review but he states that he is compliant on a nightly basis and he wears it on average 6 hours a night. He states that the only problem that he has is because of his beard sometimes he has an air leak due to a poor seal. However he feels much better since having the CPAP. He is no longer falling asleep at stop signs or while driving. His blood pressure today is well controlled. He denies any chest pain or shortness of breath. Unfortunately he continues to smoke. 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 He is not taking an aspirin. He is still smoking 1-2 packs of cigarettes per day. Fortunately he is being compliant with Lipitor Past Medical History:  Diagnosis Date  . CKD (chronic kidney disease), stage III   . Colon polyps   . Diabetes mellitus type II, uncontrolled ([place])   . Diabetes mellitus without complication ([place])   . Hypertension   . Morbid obesity ([place])   . PVD (peripheral vascular disease) ([facility])    right femoral artery  . Sleep apnea    not on CPAP   . Sleep apnea    Past Surgical History:  Procedure  Laterality Date  . COLONOSCOPY N/A 09/16/2015   Procedure: COLONOSCOPY;  Surgeon: Daneil Dolin, MD;  [facility];  Service: Endoscopy;  Laterality: N/A;  8:15 AM - pt has c pap  Current Outpatient Prescriptions on File Prior to Visit  Medication Sig Dispense Refill  . atorvastatin (LIPITOR) 20 MG tablet Take 1 tablet (20 mg total) by mouth daily at 6 PM. 90 tablet 0  . empagliflozin (JARDIANCE) 25 MG TABS tablet Take 25 mg by mouth daily. 30 tablet 3  . glipiZIDE (GLUCOTROL) 10 MG tablet TAKE 1 TABLET BY MOUTH EVERY DAY BEFORE BREAKFAST 30 tablet 3  . Insulin Pen Needle 31G X 5 MM MISC Use daily with Victoza 100 each 5  . INVOKANA 300 MG TABS tablet TAKE 1 TABLET BY MOUTH DAILY BEFORE BREAKFAST (Patient not taking: Reported on 12/19/2016) 30 tablet 11  . liraglutide 18 MG/3ML SOPN Inject 0.2 mLs (1.2 mg total) into the skin daily. (Patient not taking: Reported on 12/19/2016) 6 pen 3  . losartan (COZAAR) 50 MG tablet Take 1 tablet (50 mg total) by mouth daily. 90 tablet 3  . metFORMIN (GLUCOPHAGE) 1000 MG tablet Take 1 tablet (1,000 mg total) by mouth 2 (two) times daily with a meal. 60 tablet 0  . metFORMIN (GLUCOPHAGE) 1000 MG tablet TAKE 1 TABLET BY MOUTH TWICE DAILY 60 tablet 3   No current facility-administered medications on file prior to visit.    No Known Allergies Social History   Social History  . Marital status: Divorced    Spouse name: N/A  . Number of children: 2  . Years of education: N/A   Occupational History  . Self Employed    Social History Main Topics  . Smoking status: Current Every Day Smoker    Packs/day: 0.50    Years: 30.00    Types: Cigarettes  . Smokeless tobacco: Never Used  . Alcohol use Yes     Comment: occasional  . Drug use: No  . Sexual activity: Not on file     Comment: divorced, 2 daughters.  works Psychologist, occupational for fairs, Social research officer, government.   Other Topics Concern  . Not on file   Social History Narrative  . No narrative on file    Family History  Problem Relation Age of Onset  . Colon cancer Mother   . Diabetes Mother   . Diabetes Sister   . Diabetes Sister   . Diabetes Sister   . Heart disease Mother       Review of Systems  All other systems reviewed and are negative.      Objective:   Physical Exam  Constitutional: He appears well-developed and well-nourished. No distress.  Cardiovascular: Normal rate, regular rhythm and normal heart sounds.  Exam reveals no gallop and no friction rub.   No murmur heard. Pulmonary/Chest: Effort normal and breath sounds normal. No respiratory distress. He has no wheezes. He has no rales.  Abdominal: Soft. Bowel sounds are normal. He exhibits no distension and no mass. There is no tenderness. There is no rebound and no guarding.  Musculoskeletal: He exhibits edema.  Skin: He is not diaphoretic.   Weak peripheral pulses.  Venous stasis dermatitis bilaterally with numerous varicosities.         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  AGAIN I recommended he start aspirin 81 mg poqday and QUIT  SMOKING.  Otherwise, he will eventually suffer some sort of amputation.  He needs to work on weight loss and diet.  CHeck HgA1c, goal is less than 7.  Goal LDL is less than 70.  Check FLP.  He is reportedly compliant with CPAP>

## 2017-01-15 LAB — HEMOGLOBIN A1C
HEMOGLOBIN A1C: 8.6 % — AB (ref <5.7)
Mean Plasma Glucose: 200 mg/dL

## 2017-01-15 LAB — MICROALBUMIN, URINE: Microalb, Ur: 5.9 mg/dL

## 2017-01-18 ENCOUNTER — Other Ambulatory Visit: Payer: BLUE CROSS/BLUE SHIELD

## 2017-01-22 DIAGNOSIS — G4733 Obstructive sleep apnea (adult) (pediatric): Secondary | ICD-10-CM | POA: Diagnosis not present

## 2017-01-22 DIAGNOSIS — I10 Essential (primary) hypertension: Secondary | ICD-10-CM | POA: Diagnosis not present

## 2017-02-12 ENCOUNTER — Encounter: Payer: Self-pay | Admitting: Family Medicine

## 2017-02-12 ENCOUNTER — Ambulatory Visit (INDEPENDENT_AMBULATORY_CARE_PROVIDER_SITE_OTHER): Payer: Medicare Other | Admitting: Family Medicine

## 2017-02-12 VITALS — BP 110/68 | HR 88 | Temp 97.9°F | Resp 22 | Ht 73.0 in | Wt 315.0 lb

## 2017-02-12 DIAGNOSIS — L0231 Cutaneous abscess of buttock: Secondary | ICD-10-CM | POA: Diagnosis not present

## 2017-02-12 MED ORDER — SULFAMETHOXAZOLE-TRIMETHOPRIM 800-160 MG PO TABS: 1.0000 | ORAL_TABLET | Freq: Two times a day (BID) | ORAL | 0 refills | Status: DC

## 2017-02-12 NOTE — Progress Notes (Signed)
Subjective:    Patient ID: Steven Henry, male    DOB: [birth year], 66 y.o.   MRN: [id number]  HPI Patient presents with a painful red mass on his right gluteus. On examination, is a 6 cm x 6 cm erythematous patch of cellulitis with a central 3 cm x 3 cm fluctuant for consistent with an abscess. I anesthetized the lesion was 0.1% lidocaine, I made a 27 m vertical incision in the center and open the incision with hemostats. Copious purulent material was expressed from the incision. A wound culture was sent to pathology. Past Medical History:  Diagnosis Date  . CKD (chronic kidney disease), stage III   . Colon polyps   . Diabetes mellitus type II, uncontrolled ([facility])   . Diabetes mellitus without complication ([place])   . Hypertension   . Morbid obesity ([place])   . PVD (peripheral vascular disease) ([facility])    right femoral artery  . Sleep apnea    not on CPAP   . Sleep apnea    Past Surgical History:  Procedure Laterality Date  . COLONOSCOPY N/A 09/16/2015   Procedure: COLONOSCOPY;  Surgeon: Daneil Dolin, MD;  [facility];  Service: Endoscopy;  Laterality: N/A;  8:15 AM - pt has c pap   Current Outpatient Prescriptions on File Prior to Visit  Medication Sig Dispense Refill  . atorvastatin (LIPITOR) 20 MG tablet Take 1 tablet (20 mg total) by mouth daily at 6 PM. 90 tablet 0  . empagliflozin (JARDIANCE) 25 MG TABS tablet Take 25 mg by mouth daily. 30 tablet 3  . glipiZIDE (GLUCOTROL) 10 MG tablet TAKE 1 TABLET BY MOUTH EVERY DAY BEFORE BREAKFAST 30 tablet 3  . Insulin Pen Needle 31G X 5 MM MISC Use daily with Victoza 100 each 5  . losartan (COZAAR) 50 MG tablet Take 1 tablet (50 mg total) by mouth daily. 90 tablet 3  . metFORMIN (GLUCOPHAGE) 1000 MG tablet Take 1 tablet (1,000 mg total) by mouth 2 (two) times daily with a meal. 60 tablet 0   No current facility-administered medications on file prior to visit.    No Known Allergies Social History   Social History  . Marital  status: Divorced    Spouse name: N/A  . Number of children: 2  . Years of education: N/A   Occupational History  . Self Employed    Social History Main Topics  . Smoking status: Current Every Day Smoker    Packs/day: 0.50    Years: 30.00    Types: Cigarettes  . Smokeless tobacco: Never Used  . Alcohol use Yes     Comment: occasional  . Drug use: No  . Sexual activity: Not on file     Comment: divorced, 2 daughters.  works Psychologist, occupational for fairs, Social research officer, government.   Other Topics Concern  . Not on file   Social History Narrative  . No narrative on file      Review of Systems  All other systems reviewed and are negative.      Objective:   Physical Exam  Cardiovascular: Normal rate, regular rhythm and normal heart sounds.   Pulmonary/Chest: Effort normal and breath sounds normal. No respiratory distress. He has no wheezes.  Skin: There is erythema.     Vitals reviewed.  See hpi.       Assessment & Plan:  Abscess of buttock, right - Plan: sulfamethoxazole-trimethoprim (BACTRIM DS,SEPTRA DS) 800-160 MG tablet, CULTURE, ROUTINE-ABSCESS  Area was anesthetized 0.1% lidocaine. A  57 m vertical incision was made in the center of the abscess. The incision was opened with hemostats. Copious purulent material was removed. A wound culture was sent. The wound was then probed and cleaned with Q-tips soaked in hydrogen peroxide. It was then packed with 6 inches of iodoform gauze. Wound care was discussed. Minimal blood loss. Begin Bactrim double strength tablets 1 by mouth twice a day for 7 days. Recheck on Monday or sooner if worse.

## 2017-02-15 LAB — CULTURE, ROUTINE-ABSCESS: GRAM STAIN: NONE SEEN

## 2017-02-19 ENCOUNTER — Encounter: Payer: Self-pay | Admitting: Family Medicine

## 2017-02-19 ENCOUNTER — Ambulatory Visit (INDEPENDENT_AMBULATORY_CARE_PROVIDER_SITE_OTHER): Payer: Medicare Other | Admitting: Family Medicine

## 2017-02-19 VITALS — BP 122/60 | HR 86 | Temp 97.9°F | Resp 20 | Ht 73.0 in | Wt 314.0 lb

## 2017-02-19 DIAGNOSIS — L0231 Cutaneous abscess of buttock: Secondary | ICD-10-CM

## 2017-02-19 NOTE — Progress Notes (Signed)
Subjective:    Patient ID: Steven Henry, male    DOB: [birth year], 66 y.o.   MRN: [id number]  HPI  02/12/17 Patient presents with a painful red mass on his right gluteus. On examination, is a 6 cm x 6 cm erythematous patch of cellulitis with a central 3 cm x 3 cm fluctuant for consistent with an abscess. I anesthetized the lesion was 0.1% lidocaine, I made a 27 m vertical incision in the center and open the incision with hemostats. Copious purulent material was expressed from the incision. A wound culture was sent to pathology.  At that time, my plan was: Area was anesthetized 0.1% lidocaine. A 27 m vertical incision was made in the center of the abscess. The incision was opened with hemostats. Copious purulent material was removed. A wound culture was sent. The wound was then probed and cleaned with Q-tips soaked in hydrogen peroxide. It was then packed with 6 inches of iodoform gauze. Wound care was discussed. Minimal blood loss. Begin Bactrim double strength tablets 1 by mouth twice a day for 7 days. Recheck on Monday or sooner if worse.  02/19/17 Culture results returned staph aureus sensitive to Bactrim. All the packing was removed yesterday. The wound looks much better today. The erythema has completely faded. There is no further fluctuance. There is an opening approximately 1 cm x 1.5 cm that is very shallow. I am unable to express any purulent material from the opening. Past Medical History:  Diagnosis Date  . CKD (chronic kidney disease), stage III   . Colon polyps   . Diabetes mellitus type II, uncontrolled ([place])   . Diabetes mellitus without complication ([place])   . Hypertension   . Morbid obesity ([place])   . PVD (peripheral vascular disease) ([facility])    right femoral artery  . Sleep apnea    not on CPAP   . Sleep apnea    Past Surgical History:  Procedure Laterality Date  . COLONOSCOPY N/A 09/16/2015   Procedure: COLONOSCOPY;  Surgeon: Daneil Dolin, MD;  [facility];   Service: Endoscopy;  Laterality: N/A;  8:15 AM - pt has c pap   Current Outpatient Prescriptions on File Prior to Visit  Medication Sig Dispense Refill  . atorvastatin (LIPITOR) 20 MG tablet Take 1 tablet (20 mg total) by mouth daily at 6 PM. 90 tablet 0  . empagliflozin (JARDIANCE) 25 MG TABS tablet Take 25 mg by mouth daily. 30 tablet 3  . glipiZIDE (GLUCOTROL) 10 MG tablet TAKE 1 TABLET BY MOUTH EVERY DAY BEFORE BREAKFAST 30 tablet 3  . Insulin Pen Needle 31G X 5 MM MISC Use daily with Victoza 100 each 5  . losartan (COZAAR) 50 MG tablet Take 1 tablet (50 mg total) by mouth daily. 90 tablet 3  . metFORMIN (GLUCOPHAGE) 1000 MG tablet Take 1 tablet (1,000 mg total) by mouth 2 (two) times daily with a meal. 60 tablet 0  . sulfamethoxazole-trimethoprim (BACTRIM DS,SEPTRA DS) 800-160 MG tablet Take 1 tablet by mouth 2 (two) times daily. 14 tablet 0   No current facility-administered medications on file prior to visit.    No Known Allergies Social History   Social History  . Marital status: Divorced    Spouse name: N/A  . Number of children: 2  . Years of education: N/A   Occupational History  . Self Employed    Social History Main Topics  . Smoking status: Current Every Day Smoker    Packs/day: 0.50  Years: 30.00    Types: Cigarettes  . Smokeless tobacco: Never Used  . Alcohol use Yes     Comment: occasional  . Drug use: No  . Sexual activity: Not on file     Comment: divorced, 2 daughters.  works Psychologist, occupational for fairs, Social research officer, government.   Other Topics Concern  . Not on file   Social History Narrative  . No narrative on file      Review of Systems  All other systems reviewed and are negative.      Objective:   Physical Exam  Cardiovascular: Normal rate, regular rhythm and normal heart sounds.   Pulmonary/Chest: Effort normal and breath sounds normal. No respiratory distress. He has no wheezes.  Skin: No erythema.     Vitals reviewed.  See hpi.         Assessment & Plan:  Abscess of buttock, right  Healing appropriately. No further antibiotics are necessary. Recheck hemoglobin A1c in 3 months. Continue to encourage weight loss and smoking cessation and compliance with medication

## 2017-02-22 DIAGNOSIS — I10 Essential (primary) hypertension: Secondary | ICD-10-CM | POA: Diagnosis not present

## 2017-02-22 DIAGNOSIS — G4733 Obstructive sleep apnea (adult) (pediatric): Secondary | ICD-10-CM | POA: Diagnosis not present

## 2017-03-13 ENCOUNTER — Other Ambulatory Visit: Payer: Self-pay | Admitting: Family Medicine

## 2017-03-25 ENCOUNTER — Other Ambulatory Visit: Payer: Self-pay | Admitting: Family Medicine

## 2017-03-25 ENCOUNTER — Encounter: Payer: Self-pay | Admitting: Vascular Surgery

## 2017-03-29 ENCOUNTER — Encounter: Payer: Self-pay | Admitting: Vascular Surgery

## 2017-03-29 ENCOUNTER — Ambulatory Visit (INDEPENDENT_AMBULATORY_CARE_PROVIDER_SITE_OTHER): Payer: Medicare Other | Admitting: Vascular Surgery

## 2017-03-29 ENCOUNTER — Ambulatory Visit (HOSPITAL_COMMUNITY)
Admission: RE | Admit: 2017-03-29 | Discharge: 2017-03-29 | Disposition: A | Discharge status: Home / Self Care | Payer: Medicare Other | Source: Ambulatory Visit | Attending: Vascular Surgery | Admitting: Vascular Surgery

## 2017-03-29 VITALS — BP 116/75 | HR 78 | Temp 98.4°F | Resp 20 | Ht 73.0 in | Wt 316.0 lb

## 2017-03-29 DIAGNOSIS — I872 Venous insufficiency (chronic) (peripheral): Secondary | ICD-10-CM

## 2017-03-29 DIAGNOSIS — I739 Peripheral vascular disease, unspecified: Secondary | ICD-10-CM | POA: Diagnosis not present

## 2017-03-29 NOTE — Progress Notes (Signed)
Patient ID: Steven Henry, male   DOB: 1951/05/13, 66 y.o.   MRN: [id number]  Reason for Consult: PAD (3 mth f/u LE reflux bilat. )   Referred by Steven Frizzle, MD  Subjective:     HPI:  Steven Henry is a 66 y.o. 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 He also has known lower extremity arterial disease with preserved ABIs no symptoms of claudication and rest pain tissue loss or ulceration. He does have varicosities bilaterally with significant skin changes and swelling. At our last visit we discussed weight loss and he states he is down 6 pounds. We also discussed smoking cessation and he has stopped momentarily but is now smoking a few cigarettes again. He did start aspirin therapy but has not worn compression stockings as directed and does spend significant time on his feet. He has not had any ulceration to date. He continues to work [employer]. He had venous reflux testing prior to today's visit. Chief complaint today is swelling of his lower extremities. Activity mostly limited by shortness of breath.  Past Medical History:  Diagnosis Date  . CKD (chronic kidney disease), stage III   . Colon polyps   . Diabetes mellitus type II, uncontrolled ([place])   . Diabetes mellitus without complication ([place])   . Hypertension   . Morbid obesity ([place])   . PVD (peripheral vascular disease) (H[facility])    right femoral artery  . Sleep apnea    not on CPAP   . Sleep apnea    Family History  Problem Relation Age of Onset  . Colon cancer Mother   . Diabetes Mother   . Diabetes Sister   . Diabetes Sister   . Diabetes Sister   . Heart disease Mother    Past Surgical History:  Procedure Laterality Date  . COLONOSCOPY N/A 09/16/2015   Procedure: COLONOSCOPY;  Surgeon: Daneil Dolin, MD;  Location: [facility];  Service: Endoscopy;  Laterality: N/A;  8:15 AM - pt has c pap    Short Social History:  Social  History  Substance Use Topics  . Smoking status: Current Every Day Smoker    Packs/day: 0.50    Years: 30.00    Types: Cigarettes  . Smokeless tobacco: Never Used  . Alcohol use Yes     Comment: occasional    No Known Allergies  Current Outpatient Prescriptions  Medication Sig Dispense Refill  . atorvastatin (LIPITOR) 20 MG tablet Take 1 tablet (20 mg total) by mouth daily at 6 PM. 90 tablet 0  . glipiZIDE (GLUCOTROL) 10 MG tablet TAKE 1 TABLET BY MOUTH EVERY DAY BEFORE BREAKFAST 30 tablet 5  . JARDIANCE 25 MG TABS tablet TAKE 1 TABLET BY MOUTH DAILY. 30 tablet 3  . losartan (COZAAR) 50 MG tablet Take 1 tablet (50 mg total) by mouth daily. 90 tablet 3  . metFORMIN (GLUCOPHAGE) 1000 MG tablet Take 1 tablet (1,000 mg total) by mouth 2 (two) times daily with a meal. 60 tablet 0   No current facility-administered medications for this visit.     Review of Systems  Constitutional:  Constitutional negative. HENT: HENT negative.  Eyes: Eyes negative.  Respiratory: Positive for shortness of breath.  Cardiovascular: Positive for leg swelling.  Musculoskeletal: Musculoskeletal negative.  Skin: Skin negative.  Hematologic: Hematologic/lymphatic negative.  Psychiatric: Psychiatric negative.        Objective:  Objective   Vitals:  03/29/17 1508  BP: 116/75  Pulse: 78  Resp: 20  Temp: 98.4 F (36.9 C)  TempSrc: Oral  SpO2: 93%  Weight: (!) 316 lb (143.3 kg)  Height: 6\' 1"  (1.854 m)   Body mass index is 41.69 kg/m.  Physical Exam  Constitutional: He is oriented to person, place, and time. He appears well-developed.  HENT:  Head: Normocephalic.  Eyes: Pupils are equal, round, and reactive to light.  Cardiovascular: Normal rate.   Pulses:      Femoral pulses are 2+ on the right side, and 2+ on the left side. Pulmonary/Chest: Effort normal.  Abdominal: Soft. He exhibits no mass.  Musculoskeletal: He exhibits edema.  Neurological: He is alert and oriented to person,  place, and time.  Skin:  Skin changes consistent with c4 venous disease  Psychiatric: He has a normal mood and affect. Judgment and thought content normal.    Data: I have independently interpreted his venous reflux studies to have bilateral greater saphenous vein reflux on the right up to 1 cm in the left 0.64 cm. He does have minimal right sided small saphenous vein reflux with a size 0.41 cm.     Assessment/Plan:     Mr. Feighner is a 66 year old male here with mixed arterial and venous disease with preserved ABIs bilaterally significant swelling and C4 venous disease with reflux of his bilateral greater saphenous veins. Given his varicosities in his skin changes is important to get him in compression that he has failed to do since our last visit. I will also have him follow up in 3 months with Dr. Donnetta Hutching or Kellie Simmering for consideration of vein ablation. If he does not proceed with vein ablation I'll see him back in 1 year with repeat arterial studies. He does not have indication for arterial intervention at this time given the lack of rest pain or tissue loss at his feet. I began first smoking cessation and weight loss and he will start wearing thigh-high compression stockings 20-30 mmHg as well.     Steven Sandy MD [facility]

## 2017-05-21 ENCOUNTER — Other Ambulatory Visit: Payer: Self-pay | Admitting: Family Medicine

## 2017-07-09 ENCOUNTER — Ambulatory Visit: Payer: Medicare Other | Admitting: Vascular Surgery

## 2017-07-22 ENCOUNTER — Other Ambulatory Visit: Payer: Self-pay | Admitting: Family Medicine

## 2017-08-06 ENCOUNTER — Encounter: Payer: Self-pay | Admitting: Vascular Surgery

## 2017-08-06 ENCOUNTER — Other Ambulatory Visit: Payer: Self-pay

## 2017-08-06 ENCOUNTER — Ambulatory Visit: Payer: Medicare Other | Admitting: Vascular Surgery

## 2017-08-06 VITALS — BP 123/85 | HR 82 | Temp 97.5°F | Resp 20 | Ht 73.0 in | Wt 312.0 lb

## 2017-08-06 DIAGNOSIS — I83891 Varicose veins of right lower extremities with other complications: Secondary | ICD-10-CM | POA: Insufficient documentation

## 2017-08-06 DIAGNOSIS — I83893 Varicose veins of bilateral lower extremities with other complications: Secondary | ICD-10-CM

## 2017-08-06 NOTE — Progress Notes (Signed)
Subjective:     Patient ID: Steven Henry, male   DOB: 1951-08-30, 66 y.o.   MRN: [id number]  HPI This 66 year old male returns for 3 month follow-up regarding his bilateral venous insufficiency. Patient was seen by Dr. Donzetta Matters  3 months ago and found to have gross reflux in bilateral great saphenous veins causing skin changes pain and swelling as well as bulging varicosities right worse than left. He has been treated with long leg elastic compression stockings 20-30 millimeter gradient as well as elevation and ibuprofen with no improvement in his symptoms. He has no history of stasis ulcers. He does have diabetes mellitus-type II also has ongoing tobacco abuse less than 2 packs per day. He has no history of coronary artery disease but does have PAD with stable ABIs area  Past Medical History:  Diagnosis Date  . CKD (chronic kidney disease), stage III ([place])   . Colon polyps   . Diabetes mellitus type II, uncontrolled ([place])   . Diabetes mellitus without complication ([place])   . Hypertension   . Morbid obesity ([place])   . PVD (peripheral vascular disease) (HCC)    right femoral artery  . Sleep apnea    not on CPAP   . Sleep apnea     Social History   Tobacco Use  . Smoking status: Current Every Day Smoker    Packs/day: 0.50    Years: 30.00    Pack years: 15.00    Types: Cigarettes  . Smokeless tobacco: Never Used  Substance Use Topics  . Alcohol use: Yes    Comment: occasional    Family History  Problem Relation Age of Onset  . Colon cancer Mother   . Diabetes Mother   . Diabetes Sister   . Diabetes Sister   . Diabetes Sister   . Heart disease Mother     No Known Allergies   Current Outpatient Medications:  .  atorvastatin (LIPITOR) 20 MG tablet, Take 1 tablet (20 mg total) by mouth daily at 6 PM., Disp: 90 tablet, Rfl: 0 .  glipiZIDE (GLUCOTROL) 10 MG tablet, TAKE 1 TABLET BY MOUTH EVERY DAY BEFORE BREAKFAST, Disp: 30 tablet, Rfl: 5 .  JARDIANCE 25 MG TABS tablet, TAKE  ONE TABLET BY MOUTH EVERY DAY, Disp: 30 tablet, Rfl: 2 .  losartan (COZAAR) 50 MG tablet, Take 1 tablet (50 mg total) by mouth daily., Disp: 90 tablet, Rfl: 3 .  metFORMIN (GLUCOPHAGE) 1000 MG tablet, TAKE 1 TABLET BY MOUTH TWICE DAILY, Disp: 60 tablet, Rfl: 3  Vitals:   08/06/17 1313  BP: 123/85  Pulse: 82  Resp: 20  Temp: (!) 97.5 F (36.4 C)  TempSrc: Oral  SpO2: 98%  Weight: (!) 312 lb (141.5 kg)  Height: 6\' 1"  (1.854 m)    Body mass index is 41.16 kg/m.        Review of Systems Denies chest pain, has mild dyspnea on exertion but is able to ambulate 1 block, develops heaviness of both lower extremities in the calves. No history of ischemic ulcers area    Objective:   Physical Exam BP 123/85 (BP Location: Left Arm, Patient Position: Sitting, Cuff Size: Large)   Pulse 82   Temp (!) 97.5 F (36.4 C) (Oral)   Resp 20   Ht 6\' 1"  (1.854 m)   Wt (!) 312 lb (141.5 kg)   SpO2 98%   BMI 41.16 kg/m   Gen. obese male no apparent distress alert and oriented 3 Lungs no rhonchi or  wheezing Both lower extremities have bulging varicosities in the medial thigh and calf area right greater than left. Severe hyperpigmentation on the right side lower third with no active ulceration. Left side has lesser degree of hyperpigmentation which is present. Multiple reticular veins in both medial malleolar areas.  Today I reviewed the ultrasound performed 3 months ago and performed a bedside SonoSite ultrasound independently. Patient does have large caliber great saphenous veins bilaterally with gross reflux throughout supplying these painful varicosities    Assessment:     #1 bilateral painful varicosities with swelling and skin changes right greater than left due to gross reflux bilateral great saphenous vein-CEAP4 #2 ongoing tobacco abuse #3 diabetes mellitus type 2    Plan:     I believe patient should have right lower extremity treated with laser ablation right great saphenous vein  because of his severe skin changes and chronic edema and painful varicosities with gross reflux in the right great saphenous vein. I would defer treating left great saphenous vein at this time because of the patient's ongoing tobacco abuse and diabetes mellitus with some degree of lower extremity arterial occlusive disease and high risk for coronary artery disease  We will proceed with precertification for laser ablation right great saphenous vein because conservative measures have not successful in treating this patient who does have symptoms which are affecting his daily living and also because of his severe skin changes.

## 2017-08-21 ENCOUNTER — Other Ambulatory Visit: Payer: Self-pay | Admitting: *Deleted

## 2017-08-21 DIAGNOSIS — I83893 Varicose veins of bilateral lower extremities with other complications: Secondary | ICD-10-CM

## 2017-08-26 ENCOUNTER — Encounter: Payer: Self-pay | Admitting: Vascular Surgery

## 2017-08-26 ENCOUNTER — Ambulatory Visit: Payer: Medicare Other | Admitting: Vascular Surgery

## 2017-08-26 VITALS — BP 128/72 | HR 75 | Temp 97.0°F | Resp 18 | Ht 73.0 in | Wt 315.0 lb

## 2017-08-26 DIAGNOSIS — I868 Varicose veins of other specified sites: Secondary | ICD-10-CM

## 2017-08-26 DIAGNOSIS — I83891 Varicose veins of right lower extremities with other complications: Secondary | ICD-10-CM | POA: Diagnosis not present

## 2017-08-26 HISTORY — PX: ENDOVENOUS ABLATION SAPHENOUS VEIN W/ LASER: SUR449

## 2017-08-26 NOTE — Progress Notes (Signed)
Laser Ablation Procedure    Date: 08/26/2017   Steven Henry DOB:Nov 21, 1950  Consent signed: Yes    Surgeon:  Dr. Nelda Severe. Kellie Simmering  Procedure: Laser Ablation: right Greater Saphenous Vein  BP 128/72 (BP Location: Left Arm, Patient Position: Standing, Cuff Size: Large)   Pulse 75   Temp (!) 97 F (36.1 C) (Oral)   Resp 18   Ht 6\' 1"  (1.854 m)   Wt (!) 315 lb (142.9 kg)   SpO2 97%   BMI 41.56 kg/m   Tumescent Anesthesia: 425 cc 0.9% NaCl with 50 cc Lidocaine HCL 1% and 15 cc 8.4% NaHCO3  Local Anesthesia: 4 cc Lidocaine HCL and NaHCO3 (ratio 2:1)  Pulsed Mode: 15 watts, 549ms delay, 1.0 duration  Total Energy:2576 Joules           Total Pulses: 173               Total Time: 2:52     Patient tolerated procedure well    Description of Procedure:  After marking the course of the secondary varicosities, the patient was placed on the operating table in the supine position, and the right leg was prepped and draped in sterile fashion.   Local anesthetic was administered and under ultrasound guidance the saphenous vein was accessed with a micro needle and guide wire; then the mirco puncture sheath was placed.  A guide wire was inserted saphenofemoral junction , followed by a 5 french sheath.  The position of the sheath and then the laser fiber below the junction was confirmed using the ultrasound.  Tumescent anesthesia was administered along the course of the saphenous vein using ultrasound guidance. The patient was placed in Trendelenburg position and protective laser glasses were placed on patient and staff, and the laser was fired at 15 watts continuous mode advancing 1-61mm/second for a total of 2576 joules.     Steri strip was applied to the IV insertion site and ABD pads and thigh high compression stockings were applied.  Ace wrap bandages were applied over the right calf and thigh and at the top of the saphenofemoral junction. Blood loss was less than 15 cc.  The patient  ambulated out of the operating room having tolerated the procedure well.

## 2017-08-26 NOTE — Progress Notes (Signed)
Subjective:     Patient ID: Steven Henry, male   DOB: 11-13-1950, 66 y.o.   MRN: [id number]  HPI This 66 year old male had laser ablation right great saphenous vein from the proximal calf to near the saphenofemoral junction performed under local tumescent anesthesia. A total of approximately 2500 J of energy was utilized. He tolerated the procedure well.  Review of Systems     Objective:   Physical Exam BP 128/72 (BP Location: Left Arm, Patient Position: Standing, Cuff Size: Large)   Pulse 75   Temp (!) 97 F (36.1 C) (Oral)   Resp 18   Ht 6\' 1"  (1.854 m)   Wt (!) 315 lb (142.9 kg)   SpO2 97%   BMI 41.56 kg/m        Assessment:     Well-tolerated laser ablation right great saphenous vein performed under local tumescent anesthesia    Plan:     Return in 2 weeks for venous duplex exam to confirm closure right great saphenous vein

## 2017-08-28 ENCOUNTER — Encounter: Payer: Self-pay | Admitting: Vascular Surgery

## 2017-09-10 ENCOUNTER — Other Ambulatory Visit: Payer: Self-pay

## 2017-09-10 ENCOUNTER — Encounter: Payer: Self-pay | Admitting: Vascular Surgery

## 2017-09-10 ENCOUNTER — Ambulatory Visit (HOSPITAL_COMMUNITY)
Admission: RE | Admit: 2017-09-10 | Discharge: 2017-09-10 | Disposition: A | Discharge status: Home / Self Care | Payer: Medicare Other | Source: Ambulatory Visit | Attending: Vascular Surgery | Admitting: Vascular Surgery

## 2017-09-10 ENCOUNTER — Ambulatory Visit (INDEPENDENT_AMBULATORY_CARE_PROVIDER_SITE_OTHER): Payer: Medicare Other | Admitting: Vascular Surgery

## 2017-09-10 VITALS — BP 133/84 | HR 81 | Temp 97.6°F | Resp 18 | Ht 73.0 in | Wt 313.0 lb

## 2017-09-10 DIAGNOSIS — I83891 Varicose veins of right lower extremities with other complications: Secondary | ICD-10-CM | POA: Diagnosis not present

## 2017-09-10 DIAGNOSIS — I83893 Varicose veins of bilateral lower extremities with other complications: Secondary | ICD-10-CM | POA: Diagnosis not present

## 2017-09-10 NOTE — Progress Notes (Signed)
Subjective:     Patient ID: Steven Henry, male   DOB: 02-11-1951, 66 y.o.   MRN: [id number]  HPI This 66 year old male turns 1 week post-laser ablation right great saphenous vein for painful varicosities due to gross reflux with distal edema. He states that the discomfort has resolved. He is experiencing some edema in the right leg which is about the same as it was prior to the procedure. He has wearing his elastic compression stockings 20-30 millimeter gradient and took ibuprofen as instructed.  Past Medical History:  Diagnosis Date  . CKD (chronic kidney disease), stage III ([place])   . Colon polyps   . Diabetes mellitus type II, uncontrolled ([place])   . Diabetes mellitus without complication ([place])   . Hypertension   . Morbid obesity ([place])   . PVD (peripheral vascular disease) (HCC)    right femoral artery  . Sleep apnea    not on CPAP   . Sleep apnea     Social History   Tobacco Use  . Smoking status: Current Every Day Smoker    Packs/day: 0.50    Years: 30.00    Pack years: 15.00    Types: Cigarettes  . Smokeless tobacco: Never Used  Substance Use Topics  . Alcohol use: Yes    Comment: occasional    Family History  Problem Relation Age of Onset  . Colon cancer Mother   . Diabetes Mother   . Heart disease Mother   . Diabetes Sister   . Diabetes Sister   . Diabetes Sister     No Known Allergies   Current Outpatient Medications:  .  atorvastatin (LIPITOR) 20 MG tablet, Take 1 tablet (20 mg total) by mouth daily at 6 PM., Disp: 90 tablet, Rfl: 0 .  glipiZIDE (GLUCOTROL) 10 MG tablet, TAKE 1 TABLET BY MOUTH EVERY DAY BEFORE BREAKFAST, Disp: 30 tablet, Rfl: 5 .  JARDIANCE 25 MG TABS tablet, TAKE ONE TABLET BY MOUTH EVERY DAY, Disp: 30 tablet, Rfl: 2 .  losartan (COZAAR) 50 MG tablet, Take 1 tablet (50 mg total) by mouth daily., Disp: 90 tablet, Rfl: 3 .  metFORMIN (GLUCOPHAGE) 1000 MG tablet, TAKE 1 TABLET BY MOUTH TWICE DAILY, Disp: 60 tablet, Rfl: 3  Vitals:   09/10/17 1327  BP: 133/84  Pulse: 81  Resp: 18  Temp: 97.6 F (36.4 C)  TempSrc: Oral  SpO2: 98%  Weight: (!) 313 lb (142 kg)  Height: 6\' 1"  (1.854 m)    Body mass index is 41.3 kg/m.         Review of Systems Denies chest pain but does have chronic dyspnea on exertion due to ongoing tobacco abuse. Denies claudication    Objective:   Physical Exam BP 133/84 (BP Location: Left Arm, Patient Position: Sitting, Cuff Size: Large)   Pulse 81   Temp 97.6 F (36.4 C) (Oral)   Resp 18   Ht 6\' 1"  (1.854 m)   Wt (!) 313 lb (142 kg)   SpO2 98%   BMI 41.30 kg/m   Gen. obese male no apparent distress alert and oriented 3 Lungs few scattered rhonchi but no wheezing Cardiovascular regular rhythm no murmurs Right leg with mild discomfort to deep palpation over great saphenous vein from proximal calf to near the inguinal crease. 1+ distal edema. 2+ dorsalis pedis pulse palpable.  Today I ordered a venous duplex exam the right leg which I reviewed and interpreted. There is no DVT. There is total closure of the right great  saphenous vein from the proximal calf to near the saphenofemoral junction     Assessment:     Successful laser ablation right great saphenous vein for gross reflux with painful varicosities and swelling    Plan:     Patient will return in 3 months to evaluate for possible stab phlebectomy or other procedures We have recommended not proceeding with laser ablation of the contralateral left leg because of patient's history of coronary artery disease and ongoing tobacco abuse with some lower extremity occlusive disease-to preserve the left great saphenous vein

## 2017-09-22 ENCOUNTER — Other Ambulatory Visit: Payer: Self-pay | Admitting: Family Medicine

## 2017-10-20 ENCOUNTER — Other Ambulatory Visit: Payer: Self-pay | Admitting: Family Medicine

## 2017-10-29 ENCOUNTER — Other Ambulatory Visit: Payer: Self-pay | Admitting: Family Medicine

## 2017-12-09 ENCOUNTER — Ambulatory Visit (INDEPENDENT_AMBULATORY_CARE_PROVIDER_SITE_OTHER): Payer: Medicare Other | Admitting: Vascular Surgery

## 2017-12-09 ENCOUNTER — Encounter: Payer: Self-pay | Admitting: Vascular Surgery

## 2017-12-09 VITALS — BP 119/78 | HR 85 | Temp 98.0°F | Resp 18 | Ht 73.5 in | Wt 318.0 lb

## 2017-12-09 DIAGNOSIS — I83891 Varicose veins of right lower extremities with other complications: Secondary | ICD-10-CM | POA: Diagnosis not present

## 2017-12-09 NOTE — Progress Notes (Signed)
Subjective:     Patient ID: Steven Henry, male   DOB: 1951/05/30, 67 y.o.   MRN: [id number]  HPI This 67 year old male returns 3 months post laser ablation right great saphenous vein for painful varicosities with swelling.  He states that the leg feels less heavy but he does continue to have discomfort associated with the bulges particularly in the thigh.  Swelling is mild in the distal extremity and unchanged.  He has no history of DVT or thrombophlebitis.  He continues to smoke 1-2 packs of cigarettes per day.  Past Medical History:  Diagnosis Date  . CKD (chronic kidney disease), stage III ([place])   . Colon polyps   . Diabetes mellitus type II, uncontrolled ([place])   . Diabetes mellitus without complication ([place])   . Hypertension   . Morbid obesity ([place])   . PVD (peripheral vascular disease) ([facility])    right femoral artery  . Sleep apnea    not on CPAP   . Sleep apnea     Social History   Tobacco Use  . Smoking status: Current Every Day Smoker    Packs/day: 0.50    Years: 30.00    Pack years: 15.00    Types: Cigarettes  . Smokeless tobacco: Never Used  Substance Use Topics  . Alcohol use: Yes    Comment: occasional    Family History  Problem Relation Age of Onset  . Colon cancer Mother   . Diabetes Mother   . Heart disease Mother   . Diabetes Sister   . Diabetes Sister   . Diabetes Sister     No Known Allergies   Current Outpatient Medications:  .  atorvastatin (LIPITOR) 20 MG tablet, Take 1 tablet (20 mg total) by mouth daily at 6 PM., Disp: 90 tablet, Rfl: 0 .  glipiZIDE (GLUCOTROL) 10 MG tablet, TAKE 1 TABLET BY MOUTH EVERY DAY BEFORE BREAKFAST, Disp: 30 tablet, Rfl: 5 .  JARDIANCE 25 MG TABS tablet, TAKE ONE TABLET BY MOUTH EVERY DAY, Disp: 30 tablet, Rfl: 2 .  losartan (COZAAR) 50 MG tablet, TAKE 1 TABLET BY MOUTH EVERY DAY, Disp: 90 tablet, Rfl: 3 .  metFORMIN (GLUCOPHAGE) 1000 MG tablet, TAKE 1 TABLET BY MOUTH TWICE DAILY, Disp: 60 tablet, Rfl:  3  Vitals:   12/09/17 1305  BP: 119/78  Pulse: 85  Resp: 18  Temp: 98 F (36.7 C)  TempSrc: Oral  SpO2: 95%  Weight: (!) 318 lb (144.2 kg)  Height: 6' 1.5" (1.867 m)    Body mass index is 41.39 kg/m.         Review of Systems Has chronic dyspnea on exertion due to COPD.  Denies chest pain.  Does have numbness in the distal lower extremities due to neuropathy    Objective:   Physical Exam BP 119/78 (BP Location: Left Arm, Patient Position: Sitting, Cuff Size: Large)   Pulse 85   Temp 98 F (36.7 C) (Oral)   Resp 18   Ht 6' 1.5" (1.867 m)   Wt (!) 318 lb (144.2 kg)   SpO2 95%   BMI 41.39 kg/m   General obese male in no apparent distress alert and oriented x3 Lungs mild diffuse rhonchi Cardiovascular regular rhythm no murmurs Right leg with bulging varicosities in the medial thigh and calf and extensive reticular veins of lower third right leg down toward the medial malleolus.  No hyperpigmentation or ulceration noted.  2+ dorsalis pedis pulse palpable.       Assessment:  Residual painful varicosities right leg status post successful laser ablation right great saphenous vein-still causing symptoms which are affecting patient's daily living    Plan:     Would recommend proceeding with multiple stab phlebectomy of painful varicosities-10-20-right lower extremity We will proceed with precertification to perform this in the near future and complete patient's treatment regimen Would recommend not performing laser ablation of left great saphenous vein because of patient's history of diabetes, tobacco abuse, possible coronary artery disease.

## 2018-01-03 ENCOUNTER — Other Ambulatory Visit: Payer: Self-pay | Admitting: Family Medicine

## 2018-01-03 DIAGNOSIS — E1100 Type 2 diabetes mellitus with hyperosmolarity without nonketotic hyperglycemic-hyperosmolar coma (NKHHC): Secondary | ICD-10-CM

## 2018-01-07 ENCOUNTER — Encounter: Payer: Self-pay | Admitting: Vascular Surgery

## 2018-01-07 ENCOUNTER — Ambulatory Visit: Payer: Medicare Other | Admitting: Vascular Surgery

## 2018-01-07 VITALS — BP 136/86 | HR 70 | Temp 97.8°F | Resp 18 | Ht 73.0 in | Wt 317.0 lb

## 2018-01-07 DIAGNOSIS — I83891 Varicose veins of right lower extremities with other complications: Secondary | ICD-10-CM

## 2018-01-07 HISTORY — PX: OTHER SURGICAL HISTORY: SHX169

## 2018-01-07 NOTE — Progress Notes (Signed)
    Stab Phlebectomy Procedure  Steven Henry DOB:Jul 14, 1951  01/07/2018  Consent signed: Yes  Surgeon:J.D. Kellie Simmering, MD  Procedure: stab phlebectomy: right leg  BP 136/86 (BP Location: Left Arm, Patient Position: Sitting, Cuff Size: Large)   Pulse 70   Temp 97.8 F (36.6 C) (Oral)   Resp 18   Ht 6\' 1"  (1.854 m)   Wt (!) 317 lb (143.8 kg)   SpO2 94%   BMI 41.82 kg/m   Start time: 9:15AM   End time: 9:55AM   Tumescent Anesthesia: 200 cc 0.9% NaCl with 50 cc Lidocaine HCL 1% and 15 cc 8.4% NaHCO3  Local Anesthesia: 2 cc Lidocaine HCL and NaHCO3 (ratio 2:1)   Stab Phlebectomy: 10-20 Sites: Thigh and Calf  Patient tolerated procedure well: Yes    Description of Procedure:  After marking the course of the secondary varicosities, the patient was placed on the operating table in the supine position, and the right leg was prepped and draped in sterile fashion.    The patient was then put into Trendelenburg position.  Local anesthetic was administered at the previously marked varicosities, and tumescent anesthesia was administered around the vessels.  Ten to 20 stab wounds were made using the tip of an 11 blade. And using the vein hook, the phlebectomies were performed using a hemostat to avulse the varicosities.  Adequate hemostasis was achieved, and steri strips were applied to the stab wound.      ABD pads and thigh high compression stockings were applied as well ace wraps where needed. Blood loss was less than 15 cc.  The patient ambulated out of the operating room having tolerated the procedure well.

## 2018-01-07 NOTE — Progress Notes (Signed)
Subjective:     Patient ID: Steven Henry, male   DOB: Sep 16, 1951, 67 y.o.   MRN: [id number]  HPI This 67 year old male had multiple stab phlebectomy-10-20-of painful varicosities of the right leg performed under local tumescent anesthesia.  He tolerated the procedure well.  Review of Systems     Objective:   Physical Exam BP 136/86 (BP Location: Left Arm, Patient Position: Sitting, Cuff Size: Large)   Pulse 70   Temp 97.8 F (36.6 C) (Oral)   Resp 18   Ht 6\' 1"  (1.854 m)   Wt (!) 317 lb (143.8 kg)   SpO2 94%   BMI 41.82 kg/m        Assessment:     Multiple stab phlebectomy of painful varicosities right leg performed under local tumescent anesthesia    Plan:     Patient will return to see me on a as needed basis Was recommended that he elevate his legs at night and wear short-leg elastic compression stockings on a chronic basis

## 2018-01-13 ENCOUNTER — Other Ambulatory Visit: Payer: Self-pay | Admitting: Family Medicine

## 2018-03-03 ENCOUNTER — Other Ambulatory Visit: Payer: Self-pay | Admitting: Family Medicine

## 2018-03-16 ENCOUNTER — Other Ambulatory Visit: Payer: Self-pay | Admitting: Family Medicine

## 2018-04-07 ENCOUNTER — Other Ambulatory Visit: Payer: Self-pay | Admitting: Family Medicine

## 2018-04-07 DIAGNOSIS — E1100 Type 2 diabetes mellitus with hyperosmolarity without nonketotic hyperglycemic-hyperosmolar coma (NKHHC): Secondary | ICD-10-CM

## 2018-04-14 ENCOUNTER — Other Ambulatory Visit: Payer: Self-pay | Admitting: Family Medicine

## 2018-04-14 ENCOUNTER — Encounter: Payer: Self-pay | Admitting: Family Medicine

## 2018-04-28 ENCOUNTER — Other Ambulatory Visit: Payer: Self-pay

## 2018-04-28 NOTE — Patient Outreach (Signed)
[facility]) Care Management  04/28/2018  Steven Henry 07/26/1951 [id number]   Medication Adherence call to [name] left a message for patient to call back patient is due on Losartan 50 mg and Atorvastatin 20 mg.[name] is showing past due under [place].  [place] Management Direct Dial [phone]  Fax [phone] [email or web address]

## 2018-05-05 ENCOUNTER — Other Ambulatory Visit: Payer: Self-pay | Admitting: Family Medicine

## 2018-05-05 DIAGNOSIS — E1100 Type 2 diabetes mellitus with hyperosmolarity without nonketotic hyperglycemic-hyperosmolar coma (NKHHC): Secondary | ICD-10-CM

## 2018-05-15 ENCOUNTER — Other Ambulatory Visit: Payer: Self-pay | Admitting: Family Medicine

## 2018-06-13 ENCOUNTER — Other Ambulatory Visit: Payer: Self-pay | Admitting: Family Medicine

## 2018-06-13 DIAGNOSIS — E1100 Type 2 diabetes mellitus with hyperosmolarity without nonketotic hyperglycemic-hyperosmolar coma (NKHHC): Secondary | ICD-10-CM

## 2018-06-16 ENCOUNTER — Encounter: Payer: Self-pay | Admitting: Family Medicine

## 2018-06-16 ENCOUNTER — Ambulatory Visit (INDEPENDENT_AMBULATORY_CARE_PROVIDER_SITE_OTHER): Payer: Medicare Other | Admitting: Family Medicine

## 2018-06-16 VITALS — BP 120/70 | HR 88 | Temp 97.8°F | Resp 20 | Ht 73.0 in | Wt 303.0 lb

## 2018-06-16 DIAGNOSIS — Z1388 Encounter for screening for disorder due to exposure to contaminants: Secondary | ICD-10-CM | POA: Diagnosis not present

## 2018-06-16 DIAGNOSIS — I10 Essential (primary) hypertension: Secondary | ICD-10-CM

## 2018-06-16 DIAGNOSIS — E118 Type 2 diabetes mellitus with unspecified complications: Secondary | ICD-10-CM

## 2018-06-16 DIAGNOSIS — E7800 Pure hypercholesterolemia, unspecified: Secondary | ICD-10-CM | POA: Diagnosis not present

## 2018-06-16 NOTE — Progress Notes (Signed)
Subjective:    Patient ID: Steven Henry, male    DOB: [birth year], 67 y.o.   MRN: [id number]  HPI  Patient is here today for follow-up of his chronic medical conditions.  He is finally taking an aspirin 81 mg a day.  He refuses his flu shot.  He refuses Pneumovax 23.  He is overdue for a diabetic eye exam which I recommended today.  He states that he will schedule this at his convenience.  Patient is requesting that check a blood lead level.  His grandson who stays with him has been diagnosed with lead exposure.  Apparently there is a custody battle and the grandfather is wanting to see if he has been exposed to lead as well.  He denies any symptoms such as fatigue, headache, confusion, or memory loss.  His blood pressure today is well controlled at 120/70.  He denies checking his blood sugar.  However he states the last time he checked it several months ago it was 130.  He denies any polyuria, polydipsia, or blurry vision.  He is compliant with his glipizide, metformin, and Jardiance.  He is taking Lipitor for his cholesterol.  He is taking losartan for renal protection. Past Medical History:  Diagnosis Date  . CKD (chronic kidney disease), stage III ([place])   . Colon polyps   . Diabetes mellitus type II, uncontrolled ([place])   . Diabetes mellitus without complication ([place])   . Hypertension   . Morbid obesity ([place])   . PVD (peripheral vascular disease) (HCC)    right femoral artery  . Sleep apnea    not on CPAP   . Sleep apnea    Past Surgical History:  Procedure Laterality Date  . COLONOSCOPY N/A 09/16/2015   Procedure: COLONOSCOPY;  Surgeon: Daneil Dolin, MD;  Location: [facility];  Service: Endoscopy;  Laterality: N/A;  8:15 AM - pt has c pap  . ENDOVENOUS ABLATION SAPHENOUS VEIN W/ LASER Right 08/26/2017   endovenous laser ablation right greater saphenous vein by Tinnie Gens MD   . stab phlebectomy  Right 01/07/2018   stab phlebectomy 10-20 incisions right leg by Tinnie Gens MD    Current Outpatient Medications on File Prior to Visit  Medication Sig Dispense Refill  . atorvastatin (LIPITOR) 20 MG tablet TAKE 1 TABLET BY MOUTH EVERY DAY AT 6:00 pm 90 tablet 1  . glipiZIDE (GLUCOTROL) 10 MG tablet TAKE 1 TABLET BY MOUTH EVERY DAY BEFORE BREAKFAST 30 tablet 5  . JARDIANCE 25 MG TABS tablet TAKE 1 TABLET BY MOUTH DAILY 30 tablet 1  . losartan (COZAAR) 50 MG tablet TAKE 1 TABLET BY MOUTH EVERY DAY 90 tablet 3  . metFORMIN (GLUCOPHAGE) 1000 MG tablet TAKE 1 TABLET BY MOUTH TWICE DAILY - NEEDS OFFICE VISIT AND LABS FOR FURTHER REFILLS 60 tablet 0   No current facility-administered medications on file prior to visit.    No Known Allergies Social History   Socioeconomic History  . Marital status: Divorced    Spouse name: Not on file  . Number of children: 2  . Years of education: Not on file  . Highest education level: Not on file  Occupational History  . Occupation: Self Employed  Social Needs  . Financial resource strain: Not on file  . Food insecurity:    Worry: Not on file    Inability: Not on file  . Transportation needs:    Medical: Not on file    Non-medical: Not on file  Tobacco Use  . Smoking status: Current Every Day Smoker    Packs/day: 0.50    Years: 30.00    Pack years: 15.00    Types: Cigarettes  . Smokeless tobacco: Never Used  Substance and Sexual Activity  . Alcohol use: Yes    Comment: occasional  . Drug use: No  . Sexual activity: Not on file    Comment: divorced, 2 daughters.  works Psychologist, occupational for fairs, Social research officer, government.  Lifestyle  . Physical activity:    Days per week: Not on file    Minutes per session: Not on file  . Stress: Not on file  Relationships  . Social connections:    Talks on phone: Not on file    Gets together: Not on file    Attends religious service: Not on file    Active member of club or organization: Not on file    Attends meetings of clubs or organizations: Not on file    Relationship status: Not  on file  . Intimate partner violence:    Fear of current or ex partner: Not on file    Emotionally abused: Not on file    Physically abused: Not on file    Forced sexual activity: Not on file  Other Topics Concern  . Not on file  Social History Narrative  . Not on file   Family History  Problem Relation Age of Onset  . Colon cancer Mother   . Diabetes Mother   . Heart disease Mother   . Diabetes Sister   . Diabetes Sister   . Diabetes Sister       Review of Systems  All other systems reviewed and are negative.      Objective:   Physical Exam  Constitutional: He appears well-developed and well-nourished. No distress.  Cardiovascular: Normal rate, regular rhythm and normal heart sounds. Exam reveals no gallop and no friction rub.  No murmur heard. Pulmonary/Chest: Effort normal and breath sounds normal. No respiratory distress. He has no wheezes. He has no rales.  Abdominal: Soft. Bowel sounds are normal. He exhibits no distension and no mass. There is no tenderness. There is no rebound and no guarding.  Musculoskeletal: He exhibits edema.  Skin: He is not diaphoretic.   Weak peripheral pulses.  Venous stasis dermatitis bilaterally with numerous varicosities.         Assessment & Plan:  Screening for lead exposure - Plan: Lead, blood  Controlled type 2 diabetes mellitus with complication, without long-term current use of insulin (HCC) - Plan: Hemoglobin A1c, COMPLETE METABOLIC PANEL WITH GFR, CBC with Differential/Platelet, Lipid panel, Microalbumin, urine  Essential hypertension  Morbid obesity due to excess calories ([place])  Pure hypercholesterolemia  Blood pressure today is well controlled.  I counseled the patient about smoking cessation but he is not interested in quitting smoking.  I recommended a flu shot as well as Pneumovax 23 but he politely declined.  I will check a fasting lipid panel.  Goal LDL cholesterol is less than 100.  I will check hemoglobin A1c.   Goal hemoglobin A1c is less than 6.5.  I will screen for diabetic nephropathy with a urine microalbumin.  I will screen for lead exposure but checking a blood lead level.  Recommended compliance with CPAP.

## 2018-06-18 LAB — COMPLETE METABOLIC PANEL WITH GFR
AG RATIO: 1.4 (calc) (ref 1.0–2.5)
ALBUMIN MSPROF: 4.3 g/dL (ref 3.6–5.1)
ALT: 12 U/L (ref 9–46)
AST: 13 U/L (ref 10–35)
Alkaline phosphatase (APISO): 50 U/L (ref 40–115)
BUN: 11 mg/dL (ref 7–25)
CALCIUM: 9.7 mg/dL (ref 8.6–10.3)
CHLORIDE: 102 mmol/L (ref 98–110)
CO2: 25 mmol/L (ref 20–32)
Creat: 0.89 mg/dL (ref 0.70–1.25)
GFR, EST AFRICAN AMERICAN: 103 mL/min/{1.73_m2} (ref >=60)
GFR, Est Non African American: 88 mL/min/{1.73_m2} (ref >=60)
GLOBULIN: 3.1 g/dL (ref 1.9–3.7)
Glucose, Bld: 112 mg/dL — ABNORMAL HIGH (ref 65–99)
Potassium: 4.3 mmol/L (ref 3.5–5.3)
SODIUM: 138 mmol/L (ref 135–146)
TOTAL PROTEIN: 7.4 g/dL (ref 6.1–8.1)
Total Bilirubin: 0.6 mg/dL (ref 0.2–1.2)

## 2018-06-18 LAB — LEAD, BLOOD (ADULT >= 16 YRS): LEAD: 2 ug/dL (ref <5)

## 2018-06-18 LAB — LIPID PANEL
CHOLESTEROL: 153 mg/dL (ref <200)
HDL: 45 mg/dL (ref >40)
LDL CHOLESTEROL (CALC): 89 mg/dL
Non-HDL Cholesterol (Calc): 108 mg/dL (calc) (ref <130)
Total CHOL/HDL Ratio: 3.4 (calc) (ref <5.0)
Triglycerides: 97 mg/dL (ref <150)

## 2018-06-18 LAB — MICROALBUMIN, URINE: MICROALB UR: 8.5 mg/dL

## 2018-06-18 LAB — CBC WITH DIFFERENTIAL/PLATELET
BASOS ABS: 59 {cells}/uL (ref 0–200)
Basophils Relative: 0.6 %
EOS ABS: 108 {cells}/uL (ref 15–500)
Eosinophils Relative: 1.1 %
HCT: 52 % — ABNORMAL HIGH (ref 38.5–50.0)
Hemoglobin: 17.9 g/dL — ABNORMAL HIGH (ref 13.2–17.1)
Lymphs Abs: 2068 cells/uL (ref 850–3900)
MCH: 31.3 pg (ref 27.0–33.0)
MCHC: 34.4 g/dL (ref 32.0–36.0)
MCV: 90.9 fL (ref 80.0–100.0)
MONOS PCT: 7.9 %
MPV: 11.6 fL (ref 7.5–12.5)
Neutro Abs: 6791 cells/uL (ref 1500–7800)
Neutrophils Relative %: 69.3 %
PLATELETS: 229 10*3/uL (ref 140–400)
RBC: 5.72 10*6/uL (ref 4.20–5.80)
RDW: 12.9 % (ref 11.0–15.0)
Total Lymphocyte: 21.1 %
WBC mixed population: 774 cells/uL (ref 200–950)
WBC: 9.8 10*3/uL (ref 3.8–10.8)

## 2018-06-18 LAB — HEMOGLOBIN A1C
Hgb A1c MFr Bld: 8.3 % of total Hgb — ABNORMAL HIGH (ref <5.7)
MEAN PLASMA GLUCOSE: 192 (calc)
eAG (mmol/L): 10.6 (calc)

## 2018-06-25 ENCOUNTER — Other Ambulatory Visit: Payer: Self-pay | Admitting: Family Medicine

## 2018-06-25 MED ORDER — EMPAGLIFLOZIN-LINAGLIPTIN 25-5 MG PO TABS: 1.0000 | ORAL_TABLET | Freq: Every day | ORAL | 1 refills | Status: DC

## 2018-07-16 ENCOUNTER — Other Ambulatory Visit: Payer: Self-pay | Admitting: Family Medicine

## 2018-07-17 ENCOUNTER — Other Ambulatory Visit: Payer: Self-pay | Admitting: Family Medicine

## 2018-08-26 ENCOUNTER — Other Ambulatory Visit: Payer: Self-pay

## 2018-08-26 NOTE — Patient Outreach (Signed)
[facility]) Care Management  08/26/2018  Steven Henry 1951-09-13 [id number]   Medication Adherence call to Steven Henry left a message for patient to call back patient is due on Losartan 50 mg.Steven Henry is showing past due under [place].   [place] Management Direct Dial [phone]  Fax [phone] [email or web address]

## 2018-09-22 ENCOUNTER — Other Ambulatory Visit: Payer: Self-pay | Admitting: Family Medicine

## 2018-09-25 ENCOUNTER — Ambulatory Visit (INDEPENDENT_AMBULATORY_CARE_PROVIDER_SITE_OTHER): Payer: Medicare Other | Admitting: Family Medicine

## 2018-09-25 ENCOUNTER — Encounter: Payer: Self-pay | Admitting: Family Medicine

## 2018-09-25 VITALS — BP 132/76 | HR 78 | Temp 97.7°F | Resp 20 | Ht 73.0 in | Wt 318.0 lb

## 2018-09-25 DIAGNOSIS — I10 Essential (primary) hypertension: Secondary | ICD-10-CM | POA: Diagnosis not present

## 2018-09-25 DIAGNOSIS — E7800 Pure hypercholesterolemia, unspecified: Secondary | ICD-10-CM | POA: Diagnosis not present

## 2018-09-25 DIAGNOSIS — E1100 Type 2 diabetes mellitus with hyperosmolarity without nonketotic hyperglycemic-hyperosmolar coma (NKHHC): Secondary | ICD-10-CM

## 2018-09-25 DIAGNOSIS — F17200 Nicotine dependence, unspecified, uncomplicated: Secondary | ICD-10-CM

## 2018-09-25 NOTE — Progress Notes (Signed)
Subjective:    Patient ID: Steven Henry, male    DOB: [birth year], 68 y.o.   MRN: [id number]  Medication Refill    05/2018 Patient is here today for follow-up of his chronic medical conditions.  He is finally taking an aspirin 81 mg a day.  He refuses his flu shot.  He refuses Pneumovax 23.  He is overdue for a diabetic eye exam which I recommended today.  He states that he will schedule this at his convenience.  Patient is requesting that check a blood lead level.  His grandson who stays with him has been diagnosed with lead exposure.  Apparently there is a custody battle and the grandfather is wanting to see if he has been exposed to lead as well.  He denies any symptoms such as fatigue, headache, confusion, or memory loss.  His blood pressure today is well controlled at 120/70.  He denies checking his blood sugar.  However he states the last time he checked it several months ago it was 130.  He denies any polyuria, polydipsia, or blurry vision.  He is compliant with his glipizide, metformin, and Jardiance.  He is taking Lipitor for his cholesterol.  He is taking losartan for renal protection.  At that time, my plan was: Blood pressure today is well controlled.  I counseled the patient about smoking cessation but he is not interested in quitting smoking.  I recommended a flu shot as well as Pneumovax 23 but he politely declined.  I will check a fasting lipid panel.  Goal LDL cholesterol is less than 100.  I will check hemoglobin A1c.  Goal hemoglobin A1c is less than 6.5.  I will screen for diabetic nephropathy with a urine microalbumin.  I will screen for lead exposure but checking a blood lead level.  Recommended compliance with CPAP.  No visits with results within 3 Month(s) from this visit.  Latest known visit with results is:  Office Visit on 06/16/2018  Component Date Value Ref Range Status  . Lead 06/16/2018 2  <5 mcg/dL Final   Comment: . This test was developed and its analytical  performance  characteristics have been determined by [employer]. It has not been cleared or approved by the FDA. This assay has been validated pursuant to the CLIA  regulations and is used for clinical purposes.   Marland Kitchen Specimen 06/16/2018 VENOUS   Final  . Hgb A1c MFr Bld 06/16/2018 8.3* <5.7 % of total Hgb Final   Comment: For someone without known diabetes, a hemoglobin A1c value of 6.5% or greater indicates that they may have  diabetes and this should be confirmed with a follow-up  test. . For someone with known diabetes, a value <7% indicates  that their diabetes is well controlled and a value  greater than or equal to 7% indicates suboptimal  control. A1c targets should be individualized based on  duration of diabetes, age, comorbid conditions, and  other considerations. . Currently, no consensus exists regarding use of hemoglobin A1c for diagnosis of diabetes for children. .   . Mean Plasma Glucose 06/16/2018 192  (calc) Final  . eAG (mmol/L) 06/16/2018 10.6  (calc) Final  . Glucose, Bld 06/16/2018 112* 65 - 99 mg/dL Final   Comment: .            Fasting reference interval . For someone without known diabetes, a glucose value between 100 and 125 mg/dL is consistent with prediabetes and should be confirmed with a follow-up  test. .   . BUN 06/16/2018 11  7 - 25 mg/dL Final  . Creat 06/16/2018 0.89  0.70 - 1.25 mg/dL Final   Comment: For patients >26 years of age, the reference limit for Creatinine is approximately 13% higher for people identified as African-American. .   . GFR, Est Non African American 06/16/2018 88  > OR = 60 mL/min/1.44m2 Final  . GFR, Est African American 06/16/2018 103  > OR = 60 mL/min/1.105m2 Final  . BUN/Creatinine Ratio 40/81/4481 NOT APPLICABLE  6 - 22 (calc) Final  . Sodium 06/16/2018 138  135 - 146 mmol/L Final  . Potassium 06/16/2018 4.3  3.5 - 5.3 mmol/L Final  . Chloride 06/16/2018 102  98 - 110 mmol/L Final  . CO2 06/16/2018 25   20 - 32 mmol/L Final  . Calcium 06/16/2018 9.7  8.6 - 10.3 mg/dL Final  . Total Protein 06/16/2018 7.4  6.1 - 8.1 g/dL Final  . Albumin 06/16/2018 4.3  3.6 - 5.1 g/dL Final  . Globulin 06/16/2018 3.1  1.9 - 3.7 g/dL (calc) Final  . AG Ratio 06/16/2018 1.4  1.0 - 2.5 (calc) Final  . Total Bilirubin 06/16/2018 0.6  0.2 - 1.2 mg/dL Final  . Alkaline phosphatase (APISO) 06/16/2018 50  40 - 115 U/L Final  . AST 06/16/2018 13  10 - 35 U/L Final  . ALT 06/16/2018 12  9 - 46 U/L Final  . WBC 06/16/2018 9.8  3.8 - 10.8 Thousand/uL Final  . RBC 06/16/2018 5.72  4.20 - 5.80 Million/uL Final  . Hemoglobin 06/16/2018 17.9* 13.2 - 17.1 g/dL Final  . HCT 06/16/2018 52.0* 38.5 - 50.0 % Final  . MCV 06/16/2018 90.9  80.0 - 100.0 fL Final  . MCH 06/16/2018 31.3  27.0 - 33.0 pg Final  . MCHC 06/16/2018 34.4  32.0 - 36.0 g/dL Final  . RDW 06/16/2018 12.9  11.0 - 15.0 % Final  . Platelets 06/16/2018 229  140 - 400 Thousand/uL Final  . MPV 06/16/2018 11.6  7.5 - 12.5 fL Final  . Neutro Abs 06/16/2018 6,791  1,500 - 7,800 cells/uL Final  . Lymphs Abs 06/16/2018 2,068  850 - 3,900 cells/uL Final  . WBC mixed population 06/16/2018 774  200 - 950 cells/uL Final  . Eosinophils Absolute 06/16/2018 108  15 - 500 cells/uL Final  . Basophils Absolute 06/16/2018 59  0 - 200 cells/uL Final  . Neutrophils Relative % 06/16/2018 69.3  % Final  . Total Lymphocyte 06/16/2018 21.1  % Final  . Monocytes Relative 06/16/2018 7.9  % Final  . Eosinophils Relative 06/16/2018 1.1  % Final  . Basophils Relative 06/16/2018 0.6  % Final  . Cholesterol 06/16/2018 153  <200 mg/dL Final  . HDL 06/16/2018 45  >40 mg/dL Final  . Triglycerides 06/16/2018 97  <150 mg/dL Final  . LDL Cholesterol (Calc) 06/16/2018 89  mg/dL (calc) Final   Comment: Reference range: <100 . Desirable range <100 mg/dL for primary prevention;   <70 mg/dL for patients with CHD or diabetic patients  with > or = 2 CHD risk factors. Marland Kitchen LDL-C is now calculated  using the Martin-Hopkins  calculation, which is a validated novel method providing  better accuracy than the Friedewald equation in the  estimation of LDL-C.  Cresenciano Genre et al. Annamaria Helling. 8563;149(70): 2061-2068  (http://education.[employer].com/faq/FAQ164)   . Total CHOL/HDL Ratio 06/16/2018 3.4  <5.0 (calc) Final  . Non-HDL Cholesterol (Calc) 06/16/2018 108  <130 mg/dL (calc) Final   Comment: For  patients with diabetes plus 1 major ASCVD risk  factor, treating to a non-HDL-C goal of <100 mg/dL  (LDL-C of <70 mg/dL) is considered a therapeutic  option.   Jacquelyne Balint, Ur 06/16/2018 8.5  mg/dL Final   Comment: Reference Range Not established   . RAM 06/16/2018    Final   Comment: . The ADA defines abnormalities in albumin excretion as follows: Marland Kitchen Category         Result (mcg/mg creatinine) . Normal                    <30 Microalbuminuria         30-299  Clinical albuminuria   > OR = 300 . The ADA recommends that at least two of three specimens collected within a 3-6 month period be abnormal before considering a patient to be within a diagnostic category.    09/25/2018 Please see the lab work above.  Hemoglobin A1c was elevated at 8.3.  At that point I recommended discontinuation of Jardiance and replacing that with [facility] 5/25 1 p.o. daily.  Also continue to recommend therapeutic lifestyle changes.  Unfortunately weight is up 15 lbs after the holidays.  Wt Readings from Last 3 Encounters:  09/25/18 (!) 318 lb (144.2 kg)  06/16/18 (!) 303 lb (137.4 kg)  01/07/18 (!) 317 lb (143.8 kg)   During our discussion today, I have realized that the patient is still taking Jardiance and Glyxambi together.  I explained to him that he is to discontinue the Jardiance and take only Glyxambi.  The Glyxambi was intended to replace the Jardiance.  Unfortunately he continues to smoke.  Diabetic foot exam was performed today and the patient does have diminished dorsalis pedis pulses particular on  the left he also has evidence of chronic venous insufficiency with numerous varicose veins.  The legs distal to the knees are erythematous to violaceous in color.  Examination of the feet revealed no calluses or ulcers however capillary refill is decreased.  I explained to the patient that peripheral vascular disease which is been evaluated on an ABI in 2018 and found to be mild worsens with smoking and that he needs to discontinue this immediately.  Also stressed compliance with his aspirin.  His blood pressure today is well controlled. Past Medical History:  Diagnosis Date  . CKD (chronic kidney disease), stage III ([place])   . Colon polyps   . Diabetes mellitus type II, uncontrolled ([place])   . Diabetes mellitus without complication ([place])   . Hypertension   . Morbid obesity ([place])   . PVD (peripheral vascular disease) (HCC)    right femoral artery  . Sleep apnea    not on CPAP   . Sleep apnea    Past Surgical History:  Procedure Laterality Date  . COLONOSCOPY N/A 09/16/2015   Procedure: COLONOSCOPY;  Surgeon: Daneil Dolin, MD;  Location[facility];  Service: Endoscopy;  Laterality: N/A;  8:15 AM - pt has c pap  . ENDOVENOUS ABLATION SAPHENOUS VEIN W/ LASER Right 08/26/2017   endovenous laser ablation right greater saphenous vein by Tinnie Gens MD   . stab phlebectomy  Right 01/07/2018   stab phlebectomy 10-20 incisions right leg by Tinnie Gens MD    Current Outpatient Medications on File Prior to Visit  Medication Sig Dispense Refill  . atorvastatin (LIPITOR) 20 MG tablet TAKE 1 TABLET BY MOUTH EVERY DAY AT 6:00 pm 90 tablet 1  . Empagliflozin-linaGLIPtin (GLYXAMBI) 25-5 MG TABS Take  1 tablet by mouth daily. 90 tablet 1  . glipiZIDE (GLUCOTROL) 10 MG tablet TAKE 1 TABLET BY MOUTH EVERY DAY BEFORE BREAKFAST 30 tablet 3  . JARDIANCE 25 MG TABS tablet TAKE 1 TABLET BY MOUTH DAILY 30 tablet 3  . losartan (COZAAR) 50 MG tablet TAKE 1 TABLET BY MOUTH EVERY DAY 90 tablet 3  . metFORMIN  (GLUCOPHAGE) 1000 MG tablet Take 1 tablet (1,000 mg total) by mouth 2 (two) times daily with a meal. 60 tablet 3   No current facility-administered medications on file prior to visit.    No Known Allergies Social History   Socioeconomic History  . Marital status: Divorced    Spouse name: Not on file  . Number of children: 2  . Years of education: Not on file  . Highest education level: Not on file  Occupational History  . Occupation: Self Employed  Social Needs  . Financial resource strain: Not on file  . Food insecurity:    Worry: Not on file    Inability: Not on file  . Transportation needs:    Medical: Not on file    Non-medical: Not on file  Tobacco Use  . Smoking status: Current Every Day Smoker    Packs/day: 0.50    Years: 30.00    Pack years: 15.00    Types: Cigarettes  . Smokeless tobacco: Never Used  Substance and Sexual Activity  . Alcohol use: Yes    Comment: occasional  . Drug use: No  . Sexual activity: Not on file    Comment: divorced, 2 daughters.  works Psychologist, occupational for fairs, Social research officer, government.  Lifestyle  . Physical activity:    Days per week: Not on file    Minutes per session: Not on file  . Stress: Not on file  Relationships  . Social connections:    Talks on phone: Not on file    Gets together: Not on file    Attends religious service: Not on file    Active member of club or organization: Not on file    Attends meetings of clubs or organizations: Not on file    Relationship status: Not on file  . Intimate partner violence:    Fear of current or ex partner: Not on file    Emotionally abused: Not on file    Physically abused: Not on file    Forced sexual activity: Not on file  Other Topics Concern  . Not on file  Social History Narrative  . Not on file   Family History  Problem Relation Age of Onset  . Colon cancer Mother   . Diabetes Mother   . Heart disease Mother   . Diabetes Sister   . Diabetes Sister   . Diabetes Sister        Review of Systems  All other systems reviewed and are negative.      Objective:   Physical Exam  Constitutional: He appears well-developed and well-nourished. No distress.  Cardiovascular: Normal rate, regular rhythm and normal heart sounds. Exam reveals no gallop and no friction rub.  No murmur heard. Pulses:      Dorsalis pedis pulses are 1+ on the right side and 1+ on the left side.       Posterior tibial pulses are 1+ on the right side and 1+ on the left side.  Pulmonary/Chest: Effort normal and breath sounds normal. No respiratory distress. He has no wheezes. He has no rales.  Abdominal: Soft. Bowel sounds are normal.  He exhibits no distension and no mass. There is no abdominal tenderness. There is no rebound and no guarding.  Musculoskeletal:        General: Edema present.  Skin: He is not diaphoretic.   Weak peripheral pulses.  Venous stasis dermatitis bilaterally with numerous varicosities.         Assessment & Plan:  Essential hypertension  Morbid obesity due to excess calories ([place])  Uncontrolled type 2 diabetes mellitus with hyperosmolarity without coma, without long-term current use of insulin ([place]) - Plan: Hemoglobin A1c, CBC with Differential/Platelet, COMPLETE METABOLIC PANEL WITH GFR, Lipid panel, Microalbumin, urine, Ambulatory referral to Ophthalmology  Pure hypercholesterolemia  Smoker  Recommended Pneumovax 23 but the patient declined.  Recommended a flu shot as well as Prevnar but the patient declined.  I will schedule the patient for diabetic eye exam.  Diabetic foot exam is performed today and shows evidence of peripheral vascular disease.  He also has peripheral neuropathy with no sensation in his toes to 10 g monofilament.  All this puts him at high risk for complications in the future.  Recommended daily foot checks as well as smoking cessation.  Discontinue Jardiance and replace with Glyxambi.  Recheck hemoglobin A1c today in addition to a CBC,  CMP, and fasting lipid panel.  Also check urine microalbumin.  Strongly, strongly, strongly encourage smoking cessation.

## 2018-09-26 ENCOUNTER — Other Ambulatory Visit: Payer: Self-pay

## 2018-09-26 LAB — LIPID PANEL
Cholesterol: 138 mg/dL (ref <200)
HDL: 48 mg/dL (ref >40)
LDL Cholesterol (Calc): 72 mg/dL (calc)
NON-HDL CHOLESTEROL (CALC): 90 mg/dL (ref <130)
TRIGLYCERIDES: 92 mg/dL (ref <150)
Total CHOL/HDL Ratio: 2.9 (calc) (ref <5.0)

## 2018-09-26 LAB — CBC WITH DIFFERENTIAL/PLATELET
Absolute Monocytes: 1081 cells/uL — ABNORMAL HIGH (ref 200–950)
BASOS ABS: 64 {cells}/uL (ref 0–200)
Basophils Relative: 0.6 %
EOS PCT: 2.3 %
Eosinophils Absolute: 244 cells/uL (ref 15–500)
HCT: 51.2 % — ABNORMAL HIGH (ref 38.5–50.0)
HEMOGLOBIN: 17.3 g/dL — AB (ref 13.2–17.1)
Lymphs Abs: 2279 cells/uL (ref 850–3900)
MCH: 31.3 pg (ref 27.0–33.0)
MCHC: 33.8 g/dL (ref 32.0–36.0)
MCV: 92.6 fL (ref 80.0–100.0)
MONOS PCT: 10.2 %
MPV: 11.9 fL (ref 7.5–12.5)
NEUTROS ABS: 6932 {cells}/uL (ref 1500–7800)
NEUTROS PCT: 65.4 %
Platelets: 224 10*3/uL (ref 140–400)
RBC: 5.53 10*6/uL (ref 4.20–5.80)
RDW: 13 % (ref 11.0–15.0)
Total Lymphocyte: 21.5 %
WBC: 10.6 10*3/uL (ref 3.8–10.8)

## 2018-09-26 LAB — COMPLETE METABOLIC PANEL WITH GFR
AG Ratio: 1.3 (calc) (ref 1.0–2.5)
ALT: 13 U/L (ref 9–46)
AST: 14 U/L (ref 10–35)
Albumin: 4.3 g/dL (ref 3.6–5.1)
Alkaline phosphatase (APISO): 47 U/L (ref 40–115)
BUN: 14 mg/dL (ref 7–25)
CALCIUM: 9.6 mg/dL (ref 8.6–10.3)
CO2: 26 mmol/L (ref 20–32)
Chloride: 101 mmol/L (ref 98–110)
Creat: 0.91 mg/dL (ref 0.70–1.25)
GFR, EST NON AFRICAN AMERICAN: 87 mL/min/{1.73_m2} (ref >=60)
GFR, Est African American: 101 mL/min/{1.73_m2} (ref >=60)
Globulin: 3.2 g/dL (calc) (ref 1.9–3.7)
Glucose, Bld: 88 mg/dL (ref 65–99)
Potassium: 4.1 mmol/L (ref 3.5–5.3)
Sodium: 140 mmol/L (ref 135–146)
Total Bilirubin: 0.4 mg/dL (ref 0.2–1.2)
Total Protein: 7.5 g/dL (ref 6.1–8.1)

## 2018-09-26 LAB — MICROALBUMIN, URINE: Microalb, Ur: 6.3 mg/dL

## 2018-09-26 LAB — HEMOGLOBIN A1C
EAG (MMOL/L): 10.4 (calc)
Hgb A1c MFr Bld: 8.2 % of total Hgb — ABNORMAL HIGH (ref <5.7)
MEAN PLASMA GLUCOSE: 189 (calc)

## 2018-09-26 NOTE — Patient Outreach (Signed)
[place] [facility]) Care Management  09/26/2018  Carel Carrier 07-23-1951 [id number]   Medication Adherence call to Mr. Dereck Leep left a message for patient to call back patient is due on Losartan 50 mg and Atorvastatin 20 mg. [place] does not show patient under system. Mr. Germano is showing past due under [place].   [facility] Management Direct Dial [phone]  Fax [phone] [email or web address]

## 2018-10-28 ENCOUNTER — Telehealth: Payer: Self-pay | Admitting: Family Medicine

## 2018-10-28 MED ORDER — EMPAGLIFLOZIN-LINAGLIPTIN 25-5 MG PO TABS
1.0000 | ORAL_TABLET | Freq: Every day | ORAL | 1 refills | Status: DC
Start: 2018-10-28 — End: 2019-06-10

## 2018-10-28 NOTE — Telephone Encounter (Signed)
Medication called/sent to requested pharmacy  

## 2018-10-28 NOTE — Telephone Encounter (Signed)
Pt needs refill on glyxambi to [facility]

## 2018-11-19 ENCOUNTER — Other Ambulatory Visit: Payer: Self-pay | Admitting: Family Medicine

## 2018-12-13 ENCOUNTER — Other Ambulatory Visit: Payer: Self-pay | Admitting: Family Medicine

## 2018-12-13 DIAGNOSIS — E1100 Type 2 diabetes mellitus with hyperosmolarity without nonketotic hyperglycemic-hyperosmolar coma (NKHHC): Secondary | ICD-10-CM

## 2018-12-16 ENCOUNTER — Other Ambulatory Visit: Payer: Self-pay | Admitting: Family Medicine

## 2018-12-16 ENCOUNTER — Other Ambulatory Visit: Payer: Self-pay

## 2018-12-16 MED ORDER — LOSARTAN POTASSIUM 50 MG PO TABS: 50.0000 mg | ORAL_TABLET | Freq: Every day | ORAL | 3 refills | Status: DC

## 2018-12-16 MED ORDER — GLIPIZIDE 10 MG PO TABS: ORAL_TABLET | ORAL | 1 refills | Status: DC

## 2018-12-16 MED ORDER — METFORMIN HCL 1000 MG PO TABS: 1000.0000 mg | ORAL_TABLET | Freq: Two times a day (BID) | ORAL | 3 refills | Status: DC

## 2018-12-16 NOTE — Patient Outreach (Signed)
[place] [facility]) Care Management  12/16/2018  [name] 1951/01/19 [id number]   Medication Adherence call to [name] patient did not answer he is due on Losartan 100 mg.[place] said patient pick this medication on 12/14/18 for a 30 days supply.he qualify to receive a 90 days supply left a message at doctor office to send in a prescription for a 90 days supply on Metformin and Glipizide and Losartan. Steven Henry is showing past due under [facility].    [place] Management Direct Dial [phone]  Fax [phone] [email or web address]

## 2019-02-03 ENCOUNTER — Encounter: Payer: Self-pay | Admitting: Family Medicine

## 2019-02-03 ENCOUNTER — Ambulatory Visit (INDEPENDENT_AMBULATORY_CARE_PROVIDER_SITE_OTHER): Payer: Medicare Other | Admitting: Family Medicine

## 2019-02-03 ENCOUNTER — Other Ambulatory Visit: Payer: Self-pay

## 2019-02-03 VITALS — BP 126/62 | HR 94 | Temp 98.3°F | Resp 20 | Ht 73.0 in | Wt 317.0 lb

## 2019-02-03 DIAGNOSIS — T24232A Burn of second degree of left lower leg, initial encounter: Secondary | ICD-10-CM | POA: Diagnosis not present

## 2019-02-03 DIAGNOSIS — L03116 Cellulitis of left lower limb: Secondary | ICD-10-CM

## 2019-02-03 DIAGNOSIS — Z23 Encounter for immunization: Secondary | ICD-10-CM

## 2019-02-03 MED ORDER — SILVER SULFADIAZINE 1 % EX CREA: 1.0000 "application " | TOPICAL_CREAM | Freq: Every day | CUTANEOUS | 0 refills | Status: DC

## 2019-02-03 MED ORDER — CEPHALEXIN 500 MG PO CAPS: 500.0000 mg | ORAL_CAPSULE | Freq: Four times a day (QID) | ORAL | 0 refills | Status: DC

## 2019-02-03 MED ORDER — CEFTRIAXONE SODIUM 1 G IJ SOLR
1.0000 g | Freq: Once | INTRAMUSCULAR | Status: AC
  Administered 2019-02-03: 1 g via INTRAMUSCULAR
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
## 2019-02-09 ENCOUNTER — Ambulatory Visit (INDEPENDENT_AMBULATORY_CARE_PROVIDER_SITE_OTHER): Payer: Medicare Other | Admitting: Family Medicine

## 2019-02-09 ENCOUNTER — Other Ambulatory Visit: Payer: Self-pay

## 2019-02-09 ENCOUNTER — Encounter: Payer: Self-pay | Admitting: Family Medicine

## 2019-02-09 VITALS — BP 136/84 | HR 82 | Temp 98.1°F | Resp 18 | Ht 73.0 in | Wt 317.0 lb

## 2019-02-09 DIAGNOSIS — T24232D Burn of second degree of left lower leg, subsequent encounter: Secondary | ICD-10-CM

## 2019-02-09 DIAGNOSIS — L03116 Cellulitis of left lower limb: Secondary | ICD-10-CM

## 2019-02-09 MED ORDER — AMOXICILLIN-POT CLAVULANATE ER 1000-62.5 MG PO TB12: 2.0000 | ORAL_TABLET | Freq: Two times a day (BID) | ORAL | 0 refills | Status: DC

## 2019-02-09 MED ORDER — SULFAMETHOXAZOLE-TRIMETHOPRIM 800-160 MG PO TABS: 1.0000 | ORAL_TABLET | Freq: Two times a day (BID) | ORAL | 0 refills | Status: DC
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
## 2019-02-12 ENCOUNTER — Encounter: Payer: Self-pay | Admitting: Family Medicine

## 2019-02-12 ENCOUNTER — Ambulatory Visit (INDEPENDENT_AMBULATORY_CARE_PROVIDER_SITE_OTHER): Payer: Medicare Other | Admitting: Family Medicine

## 2019-02-12 ENCOUNTER — Other Ambulatory Visit: Payer: Self-pay

## 2019-02-12 ENCOUNTER — Other Ambulatory Visit: Payer: Self-pay | Admitting: Family Medicine

## 2019-02-12 VITALS — BP 136/78 | HR 80 | Temp 98.3°F | Resp 18 | Ht 73.0 in | Wt 317.0 lb

## 2019-02-12 DIAGNOSIS — L03116 Cellulitis of left lower limb: Secondary | ICD-10-CM

## 2019-02-12 DIAGNOSIS — T24232D Burn of second degree of left lower leg, subsequent encounter: Secondary | ICD-10-CM | POA: Diagnosis not present

## 2019-02-12 MED ORDER — ACCU-CHEK FASTCLIX LANCET KIT: PACK | 0 refills | Status: AC

## 2019-02-12 MED ORDER — ACCU-CHEK AVIVA PLUS W/DEVICE KIT: PACK | 0 refills | Status: DC

## 2019-02-12 MED ORDER — GLUCOSE BLOOD VI STRP: ORAL_STRIP | 12 refills | Status: DC

## 2019-02-12 MED ORDER — ACCU-CHEK FASTCLIX LANCETS MISC: 3 refills | Status: AC
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
## 2019-02-17 ENCOUNTER — Encounter: Payer: Self-pay | Admitting: Family Medicine

## 2019-02-17 ENCOUNTER — Other Ambulatory Visit: Payer: Self-pay

## 2019-02-17 ENCOUNTER — Ambulatory Visit (INDEPENDENT_AMBULATORY_CARE_PROVIDER_SITE_OTHER): Payer: Medicare Other | Admitting: Family Medicine

## 2019-02-17 VITALS — BP 126/60 | HR 84 | Temp 98.5°F | Resp 18 | Ht 73.0 in | Wt 317.0 lb

## 2019-02-17 DIAGNOSIS — E7800 Pure hypercholesterolemia, unspecified: Secondary | ICD-10-CM | POA: Diagnosis not present

## 2019-02-17 DIAGNOSIS — T24232D Burn of second degree of left lower leg, subsequent encounter: Secondary | ICD-10-CM

## 2019-02-17 DIAGNOSIS — F17200 Nicotine dependence, unspecified, uncomplicated: Secondary | ICD-10-CM

## 2019-02-17 DIAGNOSIS — E1100 Type 2 diabetes mellitus with hyperosmolarity without nonketotic hyperglycemic-hyperosmolar coma (NKHHC): Secondary | ICD-10-CM

## 2019-02-17 DIAGNOSIS — L03116 Cellulitis of left lower limb: Secondary | ICD-10-CM | POA: Diagnosis not present
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
## 2019-02-20 ENCOUNTER — Encounter: Payer: Self-pay | Admitting: Family Medicine

## 2019-02-20 ENCOUNTER — Other Ambulatory Visit: Payer: Self-pay

## 2019-02-20 ENCOUNTER — Ambulatory Visit (INDEPENDENT_AMBULATORY_CARE_PROVIDER_SITE_OTHER): Payer: Medicare Other | Admitting: Family Medicine

## 2019-02-20 VITALS — BP 126/70 | HR 66 | Temp 97.9°F | Resp 18 | Ht 73.0 in | Wt 311.0 lb

## 2019-02-20 DIAGNOSIS — E1100 Type 2 diabetes mellitus with hyperosmolarity without nonketotic hyperglycemic-hyperosmolar coma (NKHHC): Secondary | ICD-10-CM

## 2019-02-20 DIAGNOSIS — T24232D Burn of second degree of left lower leg, subsequent encounter: Secondary | ICD-10-CM | POA: Diagnosis not present
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
## 2019-02-21 LAB — CBC WITH DIFFERENTIAL/PLATELET
Absolute Monocytes: 818 cells/uL (ref 200–950)
Basophils Absolute: 51 cells/uL (ref 0–200)
Basophils Relative: 0.5 %
Eosinophils Absolute: 141 cells/uL (ref 15–500)
Eosinophils Relative: 1.4 %
HCT: 50.5 % — ABNORMAL HIGH (ref 38.5–50.0)
Hemoglobin: 17.2 g/dL — ABNORMAL HIGH (ref 13.2–17.1)
Lymphs Abs: 1848 cells/uL (ref 850–3900)
MCH: 31.3 pg (ref 27.0–33.0)
MCHC: 34.1 g/dL (ref 32.0–36.0)
MCV: 91.8 fL (ref 80.0–100.0)
MPV: 11.5 fL (ref 7.5–12.5)
Monocytes Relative: 8.1 %
Neutro Abs: 7242 cells/uL (ref 1500–7800)
Neutrophils Relative %: 71.7 %
Platelets: 253 10*3/uL (ref 140–400)
RBC: 5.5 10*6/uL (ref 4.20–5.80)
RDW: 13 % (ref 11.0–15.0)
Total Lymphocyte: 18.3 %
WBC: 10.1 10*3/uL (ref 3.8–10.8)

## 2019-02-21 LAB — COMPLETE METABOLIC PANEL WITH GFR
AG Ratio: 1.5 (calc) (ref 1.0–2.5)
ALT: 13 U/L (ref 9–46)
AST: 17 U/L (ref 10–35)
Albumin: 4.6 g/dL (ref 3.6–5.1)
Alkaline phosphatase (APISO): 46 U/L (ref 35–144)
BUN: 11 mg/dL (ref 7–25)
CO2: 24 mmol/L (ref 20–32)
Calcium: 9.7 mg/dL (ref 8.6–10.3)
Chloride: 101 mmol/L (ref 98–110)
Creat: 0.92 mg/dL (ref 0.70–1.25)
GFR, Est African American: 99 mL/min/{1.73_m2} (ref >=60)
GFR, Est Non African American: 86 mL/min/{1.73_m2} (ref >=60)
Globulin: 3.1 g/dL (calc) (ref 1.9–3.7)
Glucose, Bld: 83 mg/dL (ref 65–99)
Potassium: 4.6 mmol/L (ref 3.5–5.3)
Sodium: 139 mmol/L (ref 135–146)
Total Bilirubin: 0.6 mg/dL (ref 0.2–1.2)
Total Protein: 7.7 g/dL (ref 6.1–8.1)

## 2019-02-21 LAB — LIPID PANEL
Cholesterol: 140 mg/dL (ref <200)
HDL: 43 mg/dL (ref >=40)
LDL Cholesterol (Calc): 81 mg/dL (calc)
Non-HDL Cholesterol (Calc): 97 mg/dL (calc) (ref <130)
Total CHOL/HDL Ratio: 3.3 (calc) (ref <5.0)
Triglycerides: 79 mg/dL (ref <150)

## 2019-02-21 LAB — HEMOGLOBIN A1C
Hgb A1c MFr Bld: 7.6 % of total Hgb — ABNORMAL HIGH (ref <5.7)
Mean Plasma Glucose: 171 (calc)
eAG (mmol/L): 9.5 (calc)

## 2019-03-02 ENCOUNTER — Encounter: Payer: Self-pay | Admitting: Family Medicine

## 2019-03-02 ENCOUNTER — Ambulatory Visit (INDEPENDENT_AMBULATORY_CARE_PROVIDER_SITE_OTHER): Payer: Medicare Other | Admitting: Family Medicine

## 2019-03-02 ENCOUNTER — Other Ambulatory Visit: Payer: Self-pay

## 2019-03-02 VITALS — BP 122/68 | HR 77 | Temp 97.9°F | Resp 18 | Ht 73.0 in | Wt 311.0 lb

## 2019-03-02 DIAGNOSIS — E1100 Type 2 diabetes mellitus with hyperosmolarity without nonketotic hyperglycemic-hyperosmolar coma (NKHHC): Secondary | ICD-10-CM | POA: Diagnosis not present

## 2019-03-02 DIAGNOSIS — T24232D Burn of second degree of left lower leg, subsequent encounter: Secondary | ICD-10-CM | POA: Diagnosis not present
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
## 2019-03-16 ENCOUNTER — Other Ambulatory Visit: Payer: Self-pay

## 2019-03-16 ENCOUNTER — Ambulatory Visit (INDEPENDENT_AMBULATORY_CARE_PROVIDER_SITE_OTHER): Payer: Medicare Other | Admitting: Family Medicine

## 2019-03-16 VITALS — BP 122/60 | HR 85 | Temp 98.4°F | Resp 18 | Ht 71.0 in | Wt 299.0 lb

## 2019-03-16 DIAGNOSIS — T24232D Burn of second degree of left lower leg, subsequent encounter: Secondary | ICD-10-CM

## 2019-03-16 MED ORDER — SULFAMETHOXAZOLE-TRIMETHOPRIM 800-160 MG PO TABS: 1.0000 | ORAL_TABLET | Freq: Two times a day (BID) | ORAL | 0 refills | Status: DC
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
## 2019-03-23 ENCOUNTER — Other Ambulatory Visit: Payer: Self-pay | Admitting: Family Medicine

## 2019-03-30 ENCOUNTER — Ambulatory Visit: Payer: Medicare Other | Admitting: Family Medicine

## 2019-04-07 ENCOUNTER — Other Ambulatory Visit: Payer: Self-pay | Admitting: Family Medicine

## 2019-06-10 ENCOUNTER — Other Ambulatory Visit: Payer: Self-pay | Admitting: Family Medicine

## 2019-06-10 DIAGNOSIS — E1100 Type 2 diabetes mellitus with hyperosmolarity without nonketotic hyperglycemic-hyperosmolar coma (NKHHC): Secondary | ICD-10-CM

## 2019-07-24 ENCOUNTER — Other Ambulatory Visit: Payer: Self-pay

## 2019-07-24 NOTE — Patient Outreach (Signed)
[facility]) Care Management  07/24/2019  Steven Henry Sep 01, 1951 [id number]   Medication Adherence call to Steven Henry HIPPA Compliant Voice message left with a call back number. Steven Henry is showing past due on Losartan 50 mg under [place].   [facility] Management Direct Dial [phone]  Fax [phone] [email or web address]

## 2019-08-10 ENCOUNTER — Other Ambulatory Visit: Payer: Self-pay | Admitting: Family Medicine

## 2019-09-01 ENCOUNTER — Ambulatory Visit (INDEPENDENT_AMBULATORY_CARE_PROVIDER_SITE_OTHER): Payer: Medicare Other | Admitting: Family Medicine

## 2019-09-01 ENCOUNTER — Other Ambulatory Visit: Payer: Self-pay

## 2019-09-01 ENCOUNTER — Encounter: Payer: Self-pay | Admitting: Family Medicine

## 2019-09-01 VITALS — BP 132/74 | HR 74 | Temp 97.4°F | Resp 20 | Ht 71.0 in | Wt 308.0 lb

## 2019-09-01 DIAGNOSIS — E1100 Type 2 diabetes mellitus with hyperosmolarity without nonketotic hyperglycemic-hyperosmolar coma (NKHHC): Secondary | ICD-10-CM | POA: Diagnosis not present

## 2019-09-01 DIAGNOSIS — E7800 Pure hypercholesterolemia, unspecified: Secondary | ICD-10-CM

## 2019-09-01 DIAGNOSIS — T24232D Burn of second degree of left lower leg, subsequent encounter: Secondary | ICD-10-CM
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
## 2019-09-02 LAB — COMPLETE METABOLIC PANEL WITH GFR
AG Ratio: 1.3 (calc) (ref 1.0–2.5)
ALT: 15 U/L (ref 9–46)
AST: 16 U/L (ref 10–35)
Albumin: 4.2 g/dL (ref 3.6–5.1)
Alkaline phosphatase (APISO): 46 U/L (ref 35–144)
BUN: 13 mg/dL (ref 7–25)
CO2: 24 mmol/L (ref 20–32)
Calcium: 9.6 mg/dL (ref 8.6–10.3)
Chloride: 100 mmol/L (ref 98–110)
Creat: 0.96 mg/dL (ref 0.70–1.25)
GFR, Est African American: 94 mL/min/{1.73_m2} (ref >=60)
GFR, Est Non African American: 81 mL/min/{1.73_m2} (ref >=60)
Globulin: 3.3 g/dL (calc) (ref 1.9–3.7)
Glucose, Bld: 95 mg/dL (ref 65–99)
Potassium: 4.3 mmol/L (ref 3.5–5.3)
Sodium: 139 mmol/L (ref 135–146)
Total Bilirubin: 0.5 mg/dL (ref 0.2–1.2)
Total Protein: 7.5 g/dL (ref 6.1–8.1)

## 2019-09-02 LAB — LIPID PANEL
Cholesterol: 157 mg/dL (ref <200)
HDL: 50 mg/dL (ref >=40)
LDL Cholesterol (Calc): 89 mg/dL (calc)
Non-HDL Cholesterol (Calc): 107 mg/dL (calc) (ref <130)
Total CHOL/HDL Ratio: 3.1 (calc) (ref <5.0)
Triglycerides: 89 mg/dL (ref <150)

## 2019-09-02 LAB — CBC WITH DIFFERENTIAL/PLATELET
Absolute Monocytes: 938 cells/uL (ref 200–950)
Basophils Absolute: 61 cells/uL (ref 0–200)
Basophils Relative: 0.6 %
Eosinophils Absolute: 163 cells/uL (ref 15–500)
Eosinophils Relative: 1.6 %
HCT: 54.5 % — ABNORMAL HIGH (ref 38.5–50.0)
Hemoglobin: 18.5 g/dL — ABNORMAL HIGH (ref 13.2–17.1)
Lymphs Abs: 2101 cells/uL (ref 850–3900)
MCH: 31.3 pg (ref 27.0–33.0)
MCHC: 33.9 g/dL (ref 32.0–36.0)
MCV: 92.1 fL (ref 80.0–100.0)
MPV: 11.5 fL (ref 7.5–12.5)
Monocytes Relative: 9.2 %
Neutro Abs: 6936 cells/uL (ref 1500–7800)
Neutrophils Relative %: 68 %
Platelets: 201 10*3/uL (ref 140–400)
RBC: 5.92 10*6/uL — ABNORMAL HIGH (ref 4.20–5.80)
RDW: 13 % (ref 11.0–15.0)
Total Lymphocyte: 20.6 %
WBC: 10.2 10*3/uL (ref 3.8–10.8)

## 2019-09-02 LAB — HEMOGLOBIN A1C
Hgb A1c MFr Bld: 7.7 % of total Hgb — ABNORMAL HIGH (ref <5.7)
Mean Plasma Glucose: 174 (calc)
eAG (mmol/L): 9.7 (calc)

## 2019-10-10 ENCOUNTER — Other Ambulatory Visit: Payer: Self-pay

## 2019-10-10 ENCOUNTER — Encounter (HOSPITAL_COMMUNITY): Payer: Self-pay | Admitting: Emergency Medicine

## 2019-10-10 ENCOUNTER — Emergency Department (HOSPITAL_COMMUNITY)
Admission: EM | Admit: 2019-10-10 | Discharge: 2019-10-11 | Disposition: A | Discharge status: Home / Self Care | Payer: Medicare Other | Attending: Emergency Medicine | Admitting: Emergency Medicine

## 2019-10-10 DIAGNOSIS — Z5321 Procedure and treatment not carried out due to patient leaving prior to being seen by health care provider: Secondary | ICD-10-CM | POA: Insufficient documentation

## 2019-10-10 DIAGNOSIS — G519 Disorder of facial nerve, unspecified: Secondary | ICD-10-CM | POA: Insufficient documentation

## 2019-10-10 LAB — DIFFERENTIAL
Abs Immature Granulocytes: 0.04 10*3/uL (ref 0.00–0.07)
Basophils Absolute: 0.1 10*3/uL (ref 0.0–0.1)
Basophils Relative: 1 %
Eosinophils Absolute: 0.2 10*3/uL (ref 0.0–0.5)
Eosinophils Relative: 2 %
Immature Granulocytes: 0 %
Lymphocytes Relative: 23 %
Lymphs Abs: 2.6 10*3/uL (ref 0.7–4.0)
Monocytes Absolute: 1.1 10*3/uL — ABNORMAL HIGH (ref 0.1–1.0)
Monocytes Relative: 10 %
Neutro Abs: 7.1 10*3/uL (ref 1.7–7.7)
Neutrophils Relative %: 64 %

## 2019-10-10 LAB — APTT: aPTT: 33 seconds (ref 24–36)

## 2019-10-10 LAB — COMPREHENSIVE METABOLIC PANEL
ALT: 17 U/L (ref 0–44)
AST: 21 U/L (ref 15–41)
Albumin: 4.1 g/dL (ref 3.5–5.0)
Alkaline Phosphatase: 46 U/L (ref 38–126)
Anion gap: 11 (ref 5–15)
BUN: 14 mg/dL (ref 8–23)
CO2: 25 mmol/L (ref 22–32)
Calcium: 9.2 mg/dL (ref 8.9–10.3)
Chloride: 101 mmol/L (ref 98–111)
Creatinine, Ser: 0.95 mg/dL (ref 0.61–1.24)
GFR calc Af Amer: >60 mL/min (ref >60)
GFR calc non Af Amer: >60 mL/min (ref >60)
Glucose, Bld: 145 mg/dL — ABNORMAL HIGH (ref 70–99)
Potassium: 4.1 mmol/L (ref 3.5–5.1)
Sodium: 137 mmol/L (ref 135–145)
Total Bilirubin: 0.8 mg/dL (ref 0.3–1.2)
Total Protein: 7.9 g/dL (ref 6.5–8.1)

## 2019-10-10 LAB — CBC
HCT: 55.2 % — ABNORMAL HIGH (ref 39.0–52.0)
Hemoglobin: 18 g/dL — ABNORMAL HIGH (ref 13.0–17.0)
MCH: 31.7 pg (ref 26.0–34.0)
MCHC: 32.6 g/dL (ref 30.0–36.0)
MCV: 97.4 fL (ref 80.0–100.0)
Platelets: 219 10*3/uL (ref 150–400)
RBC: 5.67 MIL/uL (ref 4.22–5.81)
RDW: 14.1 % (ref 11.5–15.5)
WBC: 11 10*3/uL — ABNORMAL HIGH (ref 4.0–10.5)
nRBC: 0 % (ref 0.0–0.2)

## 2019-10-10 LAB — I-STAT CHEM 8, ED
BUN: 15 mg/dL (ref 8–23)
Calcium, Ion: 1.14 mmol/L — ABNORMAL LOW (ref 1.15–1.40)
Chloride: 101 mmol/L (ref 98–111)
Creatinine, Ser: 0.9 mg/dL (ref 0.61–1.24)
Glucose, Bld: 143 mg/dL — ABNORMAL HIGH (ref 70–99)
HCT: 54 % — ABNORMAL HIGH (ref 39.0–52.0)
Hemoglobin: 18.4 g/dL — ABNORMAL HIGH (ref 13.0–17.0)
Potassium: 3.8 mmol/L (ref 3.5–5.1)
Sodium: 139 mmol/L (ref 135–145)
TCO2: 28 mmol/L (ref 22–32)

## 2019-10-10 LAB — PROTIME-INR
INR: 1 (ref 0.8–1.2)
Prothrombin Time: 12.5 seconds (ref 11.4–15.2)

## 2019-10-10 MED ORDER — SODIUM CHLORIDE 0.9% FLUSH: 3.0000 mL | Freq: Once | INTRAVENOUS | Status: DC

## 2019-10-10 NOTE — ED Triage Notes (Signed)
Pt presents with R sided facial paralysis x 2-3 days, No other deficits noted.

## 2019-10-11 DIAGNOSIS — E1165 Type 2 diabetes mellitus with hyperglycemia: Secondary | ICD-10-CM | POA: Diagnosis not present

## 2019-10-11 DIAGNOSIS — G510 Bell's palsy: Secondary | ICD-10-CM | POA: Diagnosis not present

## 2019-10-11 DIAGNOSIS — R062 Wheezing: Secondary | ICD-10-CM | POA: Diagnosis not present

## 2019-10-11 DIAGNOSIS — R29810 Facial weakness: Secondary | ICD-10-CM | POA: Diagnosis not present

## 2019-10-11 DIAGNOSIS — R05 Cough: Secondary | ICD-10-CM | POA: Diagnosis not present

## 2019-10-11 NOTE — ED Notes (Signed)
Pt stated he is leaving 

## 2019-12-10 ENCOUNTER — Other Ambulatory Visit: Payer: Self-pay

## 2019-12-10 NOTE — Patient Outreach (Signed)
[facility]) Care Management  12/10/2019  Steven Henry [birth year] [id number]  Medication Adherence call to [name] HIPPA Compliant Voice message left with a call back number. [name] is showing past due on Atorvastatin 20 mg and Losartan 50 mg under [name].   [place] Management Direct Dial [phone]  Fax [phone] [name]

## 2019-12-14 ENCOUNTER — Other Ambulatory Visit: Payer: Self-pay | Admitting: Family Medicine

## 2020-01-04 ENCOUNTER — Other Ambulatory Visit: Payer: Self-pay | Admitting: Family Medicine

## 2020-01-04 DIAGNOSIS — E1100 Type 2 diabetes mellitus with hyperosmolarity without nonketotic hyperglycemic-hyperosmolar coma (NKHHC): Secondary | ICD-10-CM

## 2020-01-10 ENCOUNTER — Other Ambulatory Visit: Payer: Self-pay | Admitting: Family Medicine

## 2020-01-13 ENCOUNTER — Other Ambulatory Visit: Payer: Self-pay | Admitting: Family Medicine

## 2020-03-10 ENCOUNTER — Other Ambulatory Visit: Payer: Self-pay | Admitting: Family Medicine

## 2020-05-03 ENCOUNTER — Other Ambulatory Visit: Payer: Self-pay | Admitting: Family Medicine

## 2020-05-03 DIAGNOSIS — E1100 Type 2 diabetes mellitus with hyperosmolarity without nonketotic hyperglycemic-hyperosmolar coma (NKHHC): Secondary | ICD-10-CM

## 2020-06-06 ENCOUNTER — Other Ambulatory Visit: Payer: Self-pay | Admitting: Family Medicine

## 2020-07-03 ENCOUNTER — Other Ambulatory Visit: Payer: Self-pay | Admitting: Family Medicine

## 2020-07-03 DIAGNOSIS — E1100 Type 2 diabetes mellitus with hyperosmolarity without nonketotic hyperglycemic-hyperosmolar coma (NKHHC): Secondary | ICD-10-CM

## 2020-07-12 ENCOUNTER — Other Ambulatory Visit: Payer: Self-pay | Admitting: Family Medicine

## 2020-08-01 ENCOUNTER — Other Ambulatory Visit: Payer: Self-pay | Admitting: Family Medicine

## 2020-08-01 ENCOUNTER — Other Ambulatory Visit: Payer: Self-pay

## 2020-08-01 ENCOUNTER — Ambulatory Visit (INDEPENDENT_AMBULATORY_CARE_PROVIDER_SITE_OTHER): Payer: Medicare Other | Admitting: Family Medicine

## 2020-08-01 VITALS — BP 124/80 | HR 64 | Temp 97.3°F | Ht 71.0 in | Wt 305.0 lb

## 2020-08-01 DIAGNOSIS — E1100 Type 2 diabetes mellitus with hyperosmolarity without nonketotic hyperglycemic-hyperosmolar coma (NKHHC): Secondary | ICD-10-CM

## 2020-08-01 DIAGNOSIS — I10 Essential (primary) hypertension: Secondary | ICD-10-CM

## 2020-08-01 DIAGNOSIS — E7800 Pure hypercholesterolemia, unspecified: Secondary | ICD-10-CM

## 2020-08-01 DIAGNOSIS — Z122 Encounter for screening for malignant neoplasm of respiratory organs: Secondary | ICD-10-CM

## 2020-08-01 DIAGNOSIS — Z86010 Personal history of colonic polyps: Secondary | ICD-10-CM

## 2020-08-01 DIAGNOSIS — F17200 Nicotine dependence, unspecified, uncomplicated: Secondary | ICD-10-CM | POA: Diagnosis not present

## 2020-08-01 DIAGNOSIS — Z0001 Encounter for general adult medical examination with abnormal findings: Secondary | ICD-10-CM

## 2020-08-01 DIAGNOSIS — Z136 Encounter for screening for cardiovascular disorders: Secondary | ICD-10-CM

## 2020-08-01 DIAGNOSIS — Z125 Encounter for screening for malignant neoplasm of prostate: Secondary | ICD-10-CM

## 2020-08-01 DIAGNOSIS — N183 Chronic kidney disease, stage 3 unspecified: Secondary | ICD-10-CM

## 2020-08-01 NOTE — Progress Notes (Signed)
Subjective:    Patient ID: Steven Henry, male    DOB: 1950/11/04, 69 y.o.   MRN: [id number]  Patient is a poorly compliant 69 year old Caucasian gentleman with a history of type 2 diabetes mellitus who presents today for complete physical exam.  Past medical history significant for chronic kidney disease, hypertension, morbid obesity, peripheral vascular disease, OSA, and tobacco abuse.  Last colonoscopy was in 2016.  They removed 5 polyps that all turned out to be benign however gastroenterologist recommended a repeat colonoscopy in 5 years so he is due.  Patient is not checking his blood sugar.  However he denies any polyuria, polydipsia, or blurry vision.  He denies any recent weight loss.  He estimates that he smokes 1 to 2 packs of cigarettes a day.  He has no desire to quit smoking.  He is never had lung cancer screening.  He is never had AAA screening.  He is due for a flu shot, Pneumovax 23, Prevnar 13, shingles vaccine, and Covid vaccine.  He refuses all vaccinations despite my encouraging him particularly on Covid and Pneumovax 23.  He denies any falls, depression, or memory loss.  Patient has severe varicose veins in both legs.  He also has peripheral vascular disease with diminished dorsalis pedis and posterior tibialis pulses bilaterally.  There are no wounds on his feet or on his lower legs although he does have venous stasis changes and hyperpigmentation to the level of the knees bilaterally.  He also has peripheral neuropathy with absent sensation to 10 g monofilament bilaterally in both feet Immunization History  Administered Date(s) Administered  . Tdap 02/03/2019    Past Medical History:  Diagnosis Date  . CKD (chronic kidney disease), stage III   . Colon polyps   . Diabetes mellitus type II, uncontrolled ([place])   . Diabetes mellitus without complication ([place])   . Hypertension   . Morbid obesity ([place])   . PVD (peripheral vascular disease) ([facility])    right femoral artery   . Sleep apnea    not on CPAP   . Sleep apnea    Past Surgical History:  Procedure Laterality Date  . COLONOSCOPY N/A 09/16/2015   Procedure: COLONOSCOPY;  Surgeon: Daneil Dolin, MD;  [facility];  Service: Endoscopy;  Laterality: N/A;  8:15 AM - pt has c pap  . ENDOVENOUS ABLATION SAPHENOUS VEIN W/ LASER Right 08/26/2017   endovenous laser ablation right greater saphenous vein by Tinnie Gens MD   . stab phlebectomy  Right 01/07/2018   stab phlebectomy 10-20 incisions right leg by Tinnie Gens MD    Current Outpatient Medications on File Prior to Visit  Medication Sig Dispense Refill  . Accu-Chek FastClix Lancets MISC Check BS QAM - DX: E11.9 100 each 3  . atorvastatin (LIPITOR) 20 MG tablet TAKE 1 TABLET BY MOUTH EVERY DAY AT 6:00PM 90 tablet 1  . Blood Glucose Monitoring Suppl (ACCU-CHEK AVIVA PLUS) w/Device KIT Check BS QAM - DX: E11.9 1 kit 0  . glipiZIDE (GLUCOTROL) 10 MG tablet TAKE 1 TABLET BY MOUTH EVERY DAY BEFORE breakfast 30 tablet 0  . GLYXAMBI 25-5 MG TABS TAKE 1 TABLET BY MOUTH DAILY 30 tablet 0  . Lancets Misc. (ACCU-CHEK FASTCLIX LANCET) KIT Check BS QAM - DX: E11.9 1 kit 0  . losartan (COZAAR) 50 MG tablet TAKE 1 TABLET BY MOUTH DAILY 90 tablet 3  . metFORMIN (GLUCOPHAGE) 1000 MG tablet TAKE 1 TABLET BY MOUTH TWICE DAILY WITH a meal  60 tablet 2  . ONETOUCH ULTRA test strip USE TO CHECK BLOOD SUGAR EVERY MORNING 100 strip 12   No current facility-administered medications on file prior to visit.   No Known Allergies Social History   Socioeconomic History  . Marital status: Divorced    Spouse name: Not on file  . Number of children: 2  . Years of education: Not on file  . Highest education level: Not on file  Occupational History  . Occupation: Self Employed  Tobacco Use  . Smoking status: Current Every Day Smoker    Packs/day: 0.50    Years: 30.00    Pack years: 15.00    Types: Cigarettes  . Smokeless tobacco: Never Used  Substance and  Sexual Activity  . Alcohol use: Yes    Comment: occasional  . Drug use: No  . Sexual activity: Not on file    Comment: divorced, 2 daughters.  works Psychologist, occupational for fairs, Social research officer, government.  Other Topics Concern  . Not on file  Social History Narrative  . Not on file   Social Determinants of Health   Financial Resource Strain:   . Difficulty of Paying Living Expenses: Not on file  Food Insecurity:   . Worried About Charity fundraiser in the Last Year: Not on file  . Ran Out of Food in the Last Year: Not on file  Transportation Needs:   . Lack of Transportation (Medical): Not on file  . Lack of Transportation (Non-Medical): Not on file  Physical Activity:   . Days of Exercise per Week: Not on file  . Minutes of Exercise per Session: Not on file  Stress:   . Feeling of Stress : Not on file  Social Connections:   . Frequency of Communication with Friends and Family: Not on file  . Frequency of Social Gatherings with Friends and Family: Not on file  . Attends Religious Services: Not on file  . Active Member of Clubs or Organizations: Not on file  . Attends Archivist Meetings: Not on file  . Marital Status: Not on file  Intimate Partner Violence:   . Fear of Current or Ex-Partner: Not on file  . Emotionally Abused: Not on file  . Physically Abused: Not on file  . Sexually Abused: Not on file     Review of Systems  All other systems reviewed and are negative.      Objective:   Physical Exam Vitals reviewed.  Constitutional:      General: He is not in acute distress.    Appearance: Normal appearance. He is obese. He is not ill-appearing or toxic-appearing.  HENT:     Head: Normocephalic and atraumatic.     Right Ear: Tympanic membrane and ear canal normal.     Left Ear: Tympanic membrane and ear canal normal.     Nose: Nose normal. No congestion or rhinorrhea.     Mouth/Throat:     Mouth: Mucous membranes are moist.     Pharynx: Oropharynx is clear. No  oropharyngeal exudate or posterior oropharyngeal erythema.  Eyes:     General: No scleral icterus.       Right eye: No discharge.        Left eye: No discharge.     Extraocular Movements: Extraocular movements intact.     Conjunctiva/sclera: Conjunctivae normal.     Pupils: Pupils are equal, round, and reactive to light.  Neck:     Vascular: No carotid bruit.  Cardiovascular:  Rate and Rhythm: Normal rate and regular rhythm.     Pulses: Normal pulses.     Heart sounds: Normal heart sounds. No murmur heard.  No friction rub. No gallop.   Pulmonary:     Effort: Pulmonary effort is normal. No respiratory distress.     Breath sounds: No stridor. Wheezing and rhonchi present. No rales.  Chest:     Chest wall: No tenderness.  Abdominal:     General: Abdomen is flat. Bowel sounds are normal. There is no distension.     Palpations: Abdomen is soft. There is no mass.     Tenderness: There is no abdominal tenderness. There is no guarding or rebound.     Hernia: No hernia is present.  Musculoskeletal:     Cervical back: Normal range of motion and neck supple.  Lymphadenopathy:     Cervical: No cervical adenopathy.  Skin:    Coloration: Skin is not jaundiced.     Findings: No bruising, erythema, lesion or rash.  Neurological:     General: No focal deficit present.     Mental Status: He is alert and oriented to person, place, and time.     Cranial Nerves: No cranial nerve deficit.     Sensory: No sensory deficit.     Motor: No weakness.     Coordination: Coordination normal.     Gait: Gait normal.     Deep Tendon Reflexes: Reflexes normal.  Psychiatric:        Mood and Affect: Mood normal.        Behavior: Behavior normal.        Thought Content: Thought content normal.        Judgment: Judgment normal.               Assessment & Pl  Uncontrolled type 2 diabetes mellitus with hyperosmolarity without coma, without long-term current use of insulin ([facility]) - Plan:  Hemoglobin A1c, CBC with Differential/Platelet, COMPLETE METABOLIC PANEL WITH GFR, Lipid panel, Microalbumin, urine  Pure hypercholesterolemia  Smoker  Morbid obesity due to excess calories ([facility])  Stage 3 chronic kidney disease, unspecified whether stage 3a or 3b CKD ([place])  History of colonic polyps  Essential hypertension  Prostate cancer screening - Plan: PSA  Encounter for screening for lung cancer - Plan: CT CHEST LUNG CA SCREEN LOW DOSE W/O CM  Encounter for abdominal aortic aneurysm (AAA) screening - Plan: VAS [place] AAA DUPLEX  Patient is extremely pleasant and very funny.  However, his chronic problems are poorly managed.  patient's blood pressure is good but outside of that everything else is less than desired.  He is smoking heavily.  He is overweight.  He is overdue for multiple immunizations.  He refuses to quit smoking.  He refuses any shots.  Diabetic foot exam is significant for peripheral vascular disease as well as peripheral neuropathy.  I encouraged the patient to check his feet every day to monitor for any wounds or lesions to prevent infection.  Strongly recommended smoking cessation.  We will schedule the patient for AAA screening.  We will schedule him for a CAT scan for lung cancer screening.  Check a PSA for prostate cancer screening.  Recommended a colonoscopy but he deferred that until next year.  Check CBC, CMP, fasting lipid panel, A1c.  Goal A1c is less than 7.0.

## 2020-08-02 ENCOUNTER — Telehealth: Payer: Self-pay

## 2020-08-02 LAB — LIPID PANEL
Cholesterol: 150 mg/dL (ref <200)
HDL: 56 mg/dL (ref >=40)
LDL Cholesterol (Calc): 78 mg/dL (calc)
Non-HDL Cholesterol (Calc): 94 mg/dL (calc) (ref <130)
Total CHOL/HDL Ratio: 2.7 (calc) (ref <5.0)
Triglycerides: 82 mg/dL (ref <150)

## 2020-08-02 LAB — HEMOGLOBIN A1C
Hgb A1c MFr Bld: 7.2 % of total Hgb — ABNORMAL HIGH (ref <5.7)
Mean Plasma Glucose: 160 (calc)
eAG (mmol/L): 8.9 (calc)

## 2020-08-02 LAB — COMPLETE METABOLIC PANEL WITH GFR
AG Ratio: 1.2 (calc) (ref 1.0–2.5)
ALT: 10 U/L (ref 9–46)
AST: 14 U/L (ref 10–35)
Albumin: 4.3 g/dL (ref 3.6–5.1)
Alkaline phosphatase (APISO): 54 U/L (ref 35–144)
BUN: 11 mg/dL (ref 7–25)
CO2: 28 mmol/L (ref 20–32)
Calcium: 9.8 mg/dL (ref 8.6–10.3)
Chloride: 97 mmol/L — ABNORMAL LOW (ref 98–110)
Creat: 0.89 mg/dL (ref 0.70–1.25)
GFR, Est African American: 101 mL/min/{1.73_m2} (ref >=60)
GFR, Est Non African American: 87 mL/min/{1.73_m2} (ref >=60)
Globulin: 3.5 g/dL (calc) (ref 1.9–3.7)
Glucose, Bld: 86 mg/dL (ref 65–99)
Potassium: 4.4 mmol/L (ref 3.5–5.3)
Sodium: 138 mmol/L (ref 135–146)
Total Bilirubin: 0.5 mg/dL (ref 0.2–1.2)
Total Protein: 7.8 g/dL (ref 6.1–8.1)

## 2020-08-02 LAB — CBC WITH DIFFERENTIAL/PLATELET
Absolute Monocytes: 853 cells/uL (ref 200–950)
Basophils Absolute: 39 cells/uL (ref 0–200)
Basophils Relative: 0.4 %
Eosinophils Absolute: 137 cells/uL (ref 15–500)
Eosinophils Relative: 1.4 %
HCT: 56.6 % — ABNORMAL HIGH (ref 38.5–50.0)
Hemoglobin: 18.7 g/dL — ABNORMAL HIGH (ref 13.2–17.1)
Lymphs Abs: 1950 cells/uL (ref 850–3900)
MCH: 31.3 pg (ref 27.0–33.0)
MCHC: 33 g/dL (ref 32.0–36.0)
MCV: 94.6 fL (ref 80.0–100.0)
MPV: 11.5 fL (ref 7.5–12.5)
Monocytes Relative: 8.7 %
Neutro Abs: 6821 cells/uL (ref 1500–7800)
Neutrophils Relative %: 69.6 %
Platelets: 203 10*3/uL (ref 140–400)
RBC: 5.98 10*6/uL — ABNORMAL HIGH (ref 4.20–5.80)
RDW: 12.7 % (ref 11.0–15.0)
Total Lymphocyte: 19.9 %
WBC: 9.8 10*3/uL (ref 3.8–10.8)

## 2020-08-02 LAB — MICROALBUMIN, URINE: Microalb, Ur: 18.2 mg/dL

## 2020-08-02 LAB — PSA: PSA: 0.62 ng/mL (ref <=4.0)

## 2020-08-02 NOTE — Telephone Encounter (Signed)
Spoke with pt regarding Lab results

## 2020-08-03 ENCOUNTER — Other Ambulatory Visit: Payer: Self-pay | Admitting: Family Medicine

## 2020-08-04 ENCOUNTER — Other Ambulatory Visit: Payer: Self-pay

## 2020-08-04 ENCOUNTER — Ambulatory Visit (HOSPITAL_COMMUNITY)
Admission: RE | Admit: 2020-08-04 | Discharge: 2020-08-04 | Disposition: A | Discharge status: Home / Self Care | Payer: Medicare Other | Source: Ambulatory Visit | Attending: Family Medicine | Admitting: Family Medicine

## 2020-08-04 DIAGNOSIS — Z136 Encounter for screening for cardiovascular disorders: Secondary | ICD-10-CM | POA: Diagnosis not present

## 2020-08-05 ENCOUNTER — Telehealth: Payer: Self-pay

## 2020-08-05 NOTE — Telephone Encounter (Signed)
Pt called back for lab results, results were given and verbalized understanding of labs.

## 2020-08-12 ENCOUNTER — Ambulatory Visit (HOSPITAL_COMMUNITY): Admission: RE | Admit: 2020-08-12 | Payer: Medicare Other | Source: Ambulatory Visit

## 2020-08-28 DIAGNOSIS — J9601 Acute respiratory failure with hypoxia: Secondary | ICD-10-CM | POA: Diagnosis not present

## 2020-08-28 DIAGNOSIS — G4730 Sleep apnea, unspecified: Secondary | ICD-10-CM | POA: Diagnosis not present

## 2020-08-28 DIAGNOSIS — J440 Chronic obstructive pulmonary disease with acute lower respiratory infection: Secondary | ICD-10-CM | POA: Diagnosis not present

## 2020-08-28 DIAGNOSIS — Z720 Tobacco use: Secondary | ICD-10-CM | POA: Insufficient documentation

## 2020-08-28 DIAGNOSIS — G4733 Obstructive sleep apnea (adult) (pediatric): Secondary | ICD-10-CM | POA: Insufficient documentation

## 2020-08-28 DIAGNOSIS — J1282 Pneumonia due to coronavirus disease 2019: Secondary | ICD-10-CM | POA: Diagnosis not present

## 2020-08-28 DIAGNOSIS — E119 Type 2 diabetes mellitus without complications: Secondary | ICD-10-CM | POA: Diagnosis not present

## 2020-08-28 DIAGNOSIS — Z452 Encounter for adjustment and management of vascular access device: Secondary | ICD-10-CM | POA: Diagnosis not present

## 2020-08-28 DIAGNOSIS — J9602 Acute respiratory failure with hypercapnia: Secondary | ICD-10-CM | POA: Diagnosis not present

## 2020-08-28 DIAGNOSIS — I739 Peripheral vascular disease, unspecified: Secondary | ICD-10-CM | POA: Insufficient documentation

## 2020-08-28 DIAGNOSIS — J449 Chronic obstructive pulmonary disease, unspecified: Secondary | ICD-10-CM | POA: Diagnosis not present

## 2020-08-28 DIAGNOSIS — E1140 Type 2 diabetes mellitus with diabetic neuropathy, unspecified: Secondary | ICD-10-CM | POA: Diagnosis not present

## 2020-08-28 DIAGNOSIS — R0602 Shortness of breath: Secondary | ICD-10-CM | POA: Diagnosis not present

## 2020-08-28 DIAGNOSIS — G629 Polyneuropathy, unspecified: Secondary | ICD-10-CM | POA: Insufficient documentation

## 2020-08-28 DIAGNOSIS — E1151 Type 2 diabetes mellitus with diabetic peripheral angiopathy without gangrene: Secondary | ICD-10-CM | POA: Diagnosis not present

## 2020-08-28 DIAGNOSIS — I10 Essential (primary) hypertension: Secondary | ICD-10-CM | POA: Diagnosis not present

## 2020-08-28 DIAGNOSIS — F17210 Nicotine dependence, cigarettes, uncomplicated: Secondary | ICD-10-CM | POA: Diagnosis not present

## 2020-08-28 DIAGNOSIS — J9600 Acute respiratory failure, unspecified whether with hypoxia or hypercapnia: Secondary | ICD-10-CM | POA: Diagnosis not present

## 2020-08-28 DIAGNOSIS — U071 COVID-19: Secondary | ICD-10-CM | POA: Diagnosis not present

## 2020-08-31 ENCOUNTER — Ambulatory Visit (HOSPITAL_COMMUNITY): Payer: Medicare Other

## 2020-09-01 ENCOUNTER — Telehealth: Payer: Self-pay

## 2020-09-01 NOTE — Telephone Encounter (Signed)
Steven Henry was just released from hospital. While hospitalized Steven Henry was using Nicotine patches that the hospital gave him,  his daughter called and asked if he could receive a prescription? This is the longest Steven Henry has been without smoking his daughter is concerned and doesn't want him to start back after smoking for over 30 years.

## 2020-09-02 ENCOUNTER — Other Ambulatory Visit: Payer: Self-pay | Admitting: Family Medicine

## 2020-09-02 MED ORDER — NICOTINE 21 MG/24HR TD PT24
21.0000 mg | MEDICATED_PATCH | Freq: Every day | TRANSDERMAL | 1 refills | Status: DC
Start: 2020-09-02 — End: 2021-10-25

## 2020-09-02 NOTE — Telephone Encounter (Signed)
Rx prescription sent in for patient

## 2020-09-02 NOTE — Telephone Encounter (Signed)
Absolutely, nicoderm 21 mg patch td qd.

## 2020-09-20 ENCOUNTER — Encounter: Payer: Self-pay | Admitting: Internal Medicine

## 2020-10-02 DIAGNOSIS — U071 COVID-19: Secondary | ICD-10-CM | POA: Diagnosis not present

## 2020-10-12 ENCOUNTER — Other Ambulatory Visit: Payer: Self-pay | Admitting: Family Medicine

## 2020-10-19 ENCOUNTER — Telehealth: Payer: Self-pay | Admitting: Family Medicine

## 2020-10-19 NOTE — Telephone Encounter (Signed)
Pt/family called in, he needs orders for CPAP supplies sent to [facility]. FYI - No recent visit regarding CPAP usage

## 2020-11-01 ENCOUNTER — Other Ambulatory Visit: Payer: Self-pay | Admitting: Family Medicine

## 2020-11-01 DIAGNOSIS — E1100 Type 2 diabetes mellitus with hyperosmolarity without nonketotic hyperglycemic-hyperosmolar coma (NKHHC): Secondary | ICD-10-CM

## 2020-11-02 DIAGNOSIS — U071 COVID-19: Secondary | ICD-10-CM | POA: Diagnosis not present

## 2020-11-03 ENCOUNTER — Telehealth: Payer: Self-pay | Admitting: *Deleted

## 2020-11-03 NOTE — Telephone Encounter (Signed)
Received call from Elroy with [facility].   Reports that chart notes are required showing that patient is compliant with using CPAP and is benefiting from use.   Requested to have notes faxed to: [phone]  Patient has not had encounter to discuss CPAP in some time. Call placed to patient to schedule appointment. [place].

## 2020-11-09 ENCOUNTER — Telehealth: Payer: Self-pay | Admitting: Family Medicine

## 2020-11-09 NOTE — Telephone Encounter (Signed)
Pt wife called stating that she left message for nurse to see if pt needed another sleep study done before getting oxygen ordered from [facility]. Please call if the sleep study is needed, and if not can the order for the equipment just be placed with [facility].

## 2020-11-09 NOTE — Telephone Encounter (Signed)
Appointment scheduled for 11/14/2020.

## 2020-11-09 NOTE — Telephone Encounter (Signed)
Call placed to patient.   States that he currently has oxygen therapy at home. Reports that he requires supplies for CPAP and new face mask.   Advised that [facility] requires appointment to document that patient is still using and benefiting from CPAP prior to approving supply order.   Appointment scheduled.

## 2020-11-14 ENCOUNTER — Ambulatory Visit (HOSPITAL_COMMUNITY)
Admission: RE | Admit: 2020-11-14 | Discharge: 2020-11-14 | Disposition: A | Discharge status: Home / Self Care | Payer: Medicare Other | Source: Ambulatory Visit | Attending: Family Medicine | Admitting: Family Medicine

## 2020-11-14 ENCOUNTER — Other Ambulatory Visit: Payer: Self-pay | Admitting: Family Medicine

## 2020-11-14 ENCOUNTER — Ambulatory Visit (INDEPENDENT_AMBULATORY_CARE_PROVIDER_SITE_OTHER): Payer: Medicare Other | Admitting: Family Medicine

## 2020-11-14 ENCOUNTER — Other Ambulatory Visit: Payer: Self-pay

## 2020-11-14 ENCOUNTER — Encounter: Payer: Self-pay | Admitting: Family Medicine

## 2020-11-14 VITALS — BP 136/74 | HR 75 | Temp 97.1°F | Resp 20 | Ht 71.0 in | Wt 317.0 lb

## 2020-11-14 DIAGNOSIS — J810 Acute pulmonary edema: Secondary | ICD-10-CM | POA: Insufficient documentation

## 2020-11-14 DIAGNOSIS — R0989 Other specified symptoms and signs involving the circulatory and respiratory systems: Secondary | ICD-10-CM | POA: Diagnosis not present

## 2020-11-14 DIAGNOSIS — G4733 Obstructive sleep apnea (adult) (pediatric): Secondary | ICD-10-CM

## 2020-11-14 DIAGNOSIS — J449 Chronic obstructive pulmonary disease, unspecified: Secondary | ICD-10-CM | POA: Diagnosis not present

## 2020-11-14 DIAGNOSIS — F17200 Nicotine dependence, unspecified, uncomplicated: Secondary | ICD-10-CM

## 2020-11-14 DIAGNOSIS — L568 Other specified acute skin changes due to ultraviolet radiation: Secondary | ICD-10-CM | POA: Diagnosis not present

## 2020-11-14 DIAGNOSIS — H5789 Other specified disorders of eye and adnexa: Secondary | ICD-10-CM

## 2020-11-14 DIAGNOSIS — J811 Chronic pulmonary edema: Secondary | ICD-10-CM | POA: Diagnosis not present

## 2020-11-14 MED ORDER — ERYTHROMYCIN 5 MG/GM OP OINT: 1.0000 "application " | TOPICAL_OINTMENT | Freq: Two times a day (BID) | OPHTHALMIC | 0 refills | Status: DC

## 2020-11-14 NOTE — Progress Notes (Signed)
Subjective:    Patient ID: Steven Henry, male    DOB: Aug 14, 1951, 70 y.o.   MRN: [id number]  Patient is a 70 year old Caucasian male with a history of diabetes mellitus, smoking, COPD, severe obstructive sleep apnea. He originally made the appointment today to discuss CPAP. He was diagnosed with severe obstructive sleep apnea on a sleep study in 2014. He had a split level sleep study performed. During the diagnostic portion of the sleep study, the patient stopped breathing nearly 200 times. He was started on CPAP immediately and has been compliant with the CPAP ever since. He wears it every night 100% of the time. He states that he sleeps approximately 8 hours every night and he wears it from the time he goes to bed until the time he wakes up. However he has a poor seal on his mask. He states that air comes out the side whenever he is trying to sleep. Therefore he does not feel that the mask is working properly and that he needs a new mask. He was previously getting his supplies from [facility] however his daughter has asked that the order for new supplies be sent to "[facility]" in [place].  However both eyes are bloodshot today. They are bright red and injected. There is perilimbal sparing on either side. Patient reports photosensitivity. He states that even the headlights causes eyes to her. The left feels like there is "grit in the eye. The right also feels like there is grit in the eye. Differential diagnosis for the red eye would be either conjunctivitis versus uveitis versus iritis. Patient is also overdue for diabetic eye exam. To my knowledge he is never been screened for glaucoma.  Patient was diagnosed with COVID in December and was admitted to the ICU. He was told that he had fluid around his heart. Today on examination, he has bibasilar crackles that are prominent on both sides. He also has +1 pitting edema in both legs. He is not taking any type of diuretic. Immunization History   Administered Date(s) Administered  . Tdap 02/03/2019    Past Medical History:  Diagnosis Date  . CKD (chronic kidney disease), stage III ([place])   . Colon polyps   . Diabetes mellitus type II, uncontrolled ([place])   . Diabetes mellitus without complication ([place])   . Hypertension   . Morbid obesity ([place])   . PVD (peripheral vascular disease) (HCC)    right femoral artery  . Sleep apnea    not on CPAP   . Sleep apnea    Past Surgical History:  Procedure Laterality Date  . COLONOSCOPY N/A 09/16/2015   Procedure: COLONOSCOPY;  Surgeon: Daneil Dolin, MD;  [facility];  Service: Endoscopy;  Laterality: N/A;  8:15 AM - pt has c pap  . ENDOVENOUS ABLATION SAPHENOUS VEIN W/ LASER Right 08/26/2017   endovenous laser ablation right greater saphenous vein by Tinnie Gens MD   . stab phlebectomy  Right 01/07/2018   stab phlebectomy 10-20 incisions right leg by Tinnie Gens MD    Current Outpatient Medications on File Prior to Visit  Medication Sig Dispense Refill  . Accu-Chek FastClix Lancets MISC Check BS QAM - DX: E11.9 100 each 3  . atorvastatin (LIPITOR) 20 MG tablet TAKE 1 TABLET BY MOUTH EVERY DAY AT 6:00PM 90 tablet 1  . Blood Glucose Monitoring Suppl (ACCU-CHEK AVIVA PLUS) w/Device KIT Check BS QAM - DX: E11.9 1 kit 0  . glipiZIDE (GLUCOTROL) 10 MG tablet TAKE 1  TABLET BY MOUTH EVERY DAY BEFORE breakfast 30 tablet 0  . GLYXAMBI 25-5 MG TABS TAKE 1 TABLET BY MOUTH DAILY 30 tablet 0  . Lancets Misc. (ACCU-CHEK FASTCLIX LANCET) KIT Check BS QAM - DX: E11.9 1 kit 0  . losartan (COZAAR) 50 MG tablet TAKE 1 TABLET BY MOUTH DAILY 90 tablet 3  . metFORMIN (GLUCOPHAGE) 1000 MG tablet TAKE 1 TABLET BY MOUTH TWICE DAILY WITH a meal 60 tablet 2  . nicotine (NICODERM CQ) 21 mg/24hr patch Place 1 patch (21 mg total) onto the skin daily. 28 patch 1  . ONETOUCH ULTRA test strip USE TO CHECK BLOOD SUGAR EVERY MORNING 100 strip 12   No current facility-administered medications on file  prior to visit.   No Known Allergies Social History   Socioeconomic History  . Marital status: Divorced    Spouse name: Not on file  . Number of children: 2  . Years of education: Not on file  . Highest education level: Not on file  Occupational History  . Occupation: Self Employed  Tobacco Use  . Smoking status: Current Every Day Smoker    Packs/day: 0.50    Years: 30.00    Pack years: 15.00    Types: Cigarettes  . Smokeless tobacco: Never Used  Substance and Sexual Activity  . Alcohol use: Yes    Comment: occasional  . Drug use: No  . Sexual activity: Not on file    Comment: divorced, 2 daughters.  works Psychologist, occupational for fairs, Social research officer, government.  Other Topics Concern  . Not on file  Social History Narrative  . Not on file   Social Determinants of Health   Financial Resource Strain: Not on file  Food Insecurity: Not on file  Transportation Needs: Not on file  Physical Activity: Not on file  Stress: Not on file  Social Connections: Not on file  Intimate Partner Violence: Not on file     Review of Systems  All other systems reviewed and are negative.      Objective:   Physical Exam Vitals reviewed.  Constitutional:      General: He is not in acute distress.    Appearance: Normal appearance. He is obese. He is not ill-appearing or toxic-appearing.  HENT:     Head: Normocephalic and atraumatic.     Right Ear: Tympanic membrane and ear canal normal.     Left Ear: Tympanic membrane and ear canal normal.     Nose: Nose normal. No congestion or rhinorrhea.     Mouth/Throat:     Mouth: Mucous membranes are moist.     Pharynx: Oropharynx is clear. No oropharyngeal exudate or posterior oropharyngeal erythema.  Eyes:     General: No scleral icterus.       Right eye: No discharge.        Left eye: No discharge.     Extraocular Movements: Extraocular movements intact.     Conjunctiva/sclera: Conjunctivae normal.     Pupils: Pupils are equal, round, and reactive to  light.  Neck:     Vascular: No carotid bruit.  Cardiovascular:     Rate and Rhythm: Normal rate and regular rhythm.     Pulses: Normal pulses.     Heart sounds: Normal heart sounds. No murmur heard. No friction rub. No gallop.   Pulmonary:     Effort: Pulmonary effort is normal. No respiratory distress.     Breath sounds: No stridor. Wheezing and rhonchi present. No rales.  Chest:  Chest wall: No tenderness.  Abdominal:     General: Abdomen is flat. Bowel sounds are normal. There is no distension.     Palpations: Abdomen is soft. There is no mass.     Tenderness: There is no abdominal tenderness. There is no guarding or rebound.     Hernia: No hernia is present.  Musculoskeletal:     Cervical back: Normal range of motion and neck supple.  Lymphadenopathy:     Cervical: No cervical adenopathy.  Skin:    Coloration: Skin is not jaundiced.     Findings: No bruising, erythema, lesion or rash.  Neurological:     General: No focal deficit present.     Mental Status: He is alert and oriented to person, place, and time.     Cranial Nerves: No cranial nerve deficit.     Sensory: No sensory deficit.     Motor: No weakness.     Coordination: Coordination normal.     Gait: Gait normal.     Deep Tendon Reflexes: Reflexes normal.  Psychiatric:        Mood and Affect: Mood normal.        Behavior: Behavior normal.        Thought Content: Thought content normal.        Judgment: Judgment normal.               Assessment & Pl  Acute pulmonary edema (HCC) - Plan: DG Chest 2 View  Smoker  Chronic obstructive pulmonary disease, unspecified COPD type (HCC)  OSA and COPD overlap syndrome ([place])  Patient certainly has obstructive sleep apnea. He states that he is sleeping even during the day and is extremely tired. Is possible that the poor air seal with his old mask is causing his CPAP to be ineffective. His sleep study in June 2014 showed an apnea-hypopnea index of 83.  Therefore he has severe sleep apnea and if not treated appropriately could cause the hypersomnolence that he is experiencing. Furthermore he is hypoxic on room air today at 88% and on his exam, he habitus bibasilar crackles which I am not sure could either be scar tissue from his COVID or perhaps pulmonary edema. Therefore I have urgently sent the patient for a chest x-ray and if edema is confirmed I will start him on a diuretic which may also help hypoxia. Wt Readings from Last 3 Encounters:  11/14/20 (!) 317 lb (143.8 kg)  08/01/20 (!) 305 lb (138.3 kg)  09/01/19 (!) 308 lb (139.7 kg)   His weight is up 12 pounds since his last visit and therefore I feel that he is fluid overloaded. This could also be contributing to his shortness of breath and hypersomnolence. Third he has bilateral red eye with photosensitivity. I will start the patient on erythromycin ophthalmic ointment. I believe that this will help with some of the foreign body sensation in the eye and if there is conjunctivitis may help with that as well however I am concerned about possible uveitis and therefore I will place an ophthalmology referral

## 2020-11-15 ENCOUNTER — Other Ambulatory Visit: Payer: Self-pay | Admitting: Family Medicine

## 2020-11-15 MED ORDER — FUROSEMIDE 40 MG PO TABS: 40.0000 mg | ORAL_TABLET | Freq: Two times a day (BID) | ORAL | 3 refills | Status: DC

## 2020-11-17 ENCOUNTER — Telehealth: Payer: Self-pay | Admitting: Family Medicine

## 2020-11-17 ENCOUNTER — Encounter: Payer: Self-pay | Admitting: Family Medicine

## 2020-11-17 NOTE — Telephone Encounter (Signed)
Please advise 

## 2020-11-17 NOTE — Telephone Encounter (Signed)
Need a order fax # [phone] to  [facility] for CPAP machine  plus supply no longer using [facility]. Also need for a oxygen O2 back pack order so the insurance can tell him how much it cost

## 2020-11-17 NOTE — Telephone Encounter (Signed)
CPAP and supply order faxed to [facility].

## 2020-11-17 NOTE — Telephone Encounter (Signed)
OK with ordering cpap and supplies

## 2020-11-18 ENCOUNTER — Ambulatory Visit (INDEPENDENT_AMBULATORY_CARE_PROVIDER_SITE_OTHER): Payer: Medicare Other | Admitting: Family Medicine

## 2020-11-18 ENCOUNTER — Encounter: Payer: Self-pay | Admitting: Family Medicine

## 2020-11-18 ENCOUNTER — Other Ambulatory Visit: Payer: Self-pay

## 2020-11-18 VITALS — BP 132/78 | HR 78 | Temp 98.0°F | Ht 71.0 in | Wt 319.0 lb

## 2020-11-18 DIAGNOSIS — J810 Acute pulmonary edema: Secondary | ICD-10-CM

## 2020-11-18 MED ORDER — FUROSEMIDE 40 MG PO TABS: 40.0000 mg | ORAL_TABLET | Freq: Two times a day (BID) | ORAL | 3 refills | Status: DC

## 2020-11-18 NOTE — Progress Notes (Signed)
Subjective:    Patient ID: Steven Henry, male    DOB: 01/11/1951, 70 y.o.   MRN: [id number] 11/14/20 Patient is a 70 year old Caucasian male with a history of diabetes mellitus, smoking, COPD, severe obstructive sleep apnea. He originally made the appointment today to discuss CPAP. He was diagnosed with severe obstructive sleep apnea on a sleep study in 2014. He had a split level sleep study performed. During the diagnostic portion of the sleep study, the patient stopped breathing nearly 200 times. He was started on CPAP immediately and has been compliant with the CPAP ever since. He wears it every night 100% of the time. He states that he sleeps approximately 8 hours every night and he wears it from the time he goes to bed until the time he wakes up. However he has a poor seal on his mask. He states that air comes out the side whenever he is trying to sleep. Therefore he does not feel that the mask is working properly and that he needs a new mask. He was previously getting his supplies from [facility] however his daughter has asked that the order for new supplies be sent to "[facility]" in [place].  However both eyes are bloodshot today. They are bright red and injected. There is perilimbal sparing on either side. Patient reports photosensitivity. He states that even the headlights causes eyes to her. The left feels like there is "grit in the eye. The right also feels like there is grit in the eye. Differential diagnosis for the red eye would be either conjunctivitis versus uveitis versus iritis. Patient is also overdue for diabetic eye exam. To my knowledge he is never been screened for glaucoma.  Patient was diagnosed with COVID in December and was admitted to the ICU. He was told that he had fluid around his heart. Today on examination, he has bibasilar crackles that are prominent on both sides. He also has +1 pitting edema in both legs. He is not taking any type of diuretic.  At that time, my plan  was: Patient certainly has obstructive sleep apnea. He states that he is sleeping even during the day and is extremely tired. Is possible that the poor air seal with his old mask is causing his CPAP to be ineffective. His sleep study in June 2014 showed an apnea-hypopnea index of 83. Therefore he has severe sleep apnea and if not treated appropriately could cause the hypersomnolence that he is experiencing. Furthermore he is hypoxic on room air today at 88% and on his exam, he habitus bibasilar crackles which I am not sure could either be scar tissue from his COVID or perhaps pulmonary edema. Therefore I have urgently sent the patient for a chest x-ray and if edema is confirmed I will start him on a diuretic which may also help hypoxia. Wt Readings from Last 3 Encounters:  11/18/20 (!) 319 lb (144.7 kg)  11/14/20 (!) 317 lb (143.8 kg)  08/01/20 (!) 305 lb (138.3 kg)   His weight is up 12 pounds since his last visit and therefore I feel that he is fluid overloaded. This could also be contributing to his shortness of breath and hypersomnolence. Third he has bilateral red eye with photosensitivity. I will start the patient on erythromycin ophthalmic ointment. I believe that this will help with some of the foreign body sensation in the eye and if there is conjunctivitis may help with that as well however I am concerned about possible uveitis and therefore I will place  an ophthalmology referral  11/18/20 Chest x-ray showed vascular congestion and pulmonary edema.  I sent in a prescription for Lasix 40 mg twice daily however the patient has not started the medication because I sent it to the wrong pharmacy.  He continues to have borderline hypoxia at 90.  He still has +1 pitting edema in bilateral crackles.  He also admits that he is drinking about 8 bottles of water a day and is urinating frequently. Immunization History  Administered Date(s) Administered  . Tdap 02/03/2019    Past Medical History:   Diagnosis Date  . CKD (chronic kidney disease), stage III ([place])   . Colon polyps   . Diabetes mellitus type II, uncontrolled ([place])   . Diabetes mellitus without complication ([place])   . Hypertension   . Morbid obesity ([place])   . PVD (peripheral vascular disease) (HCC)    right femoral artery  . Sleep apnea    not on CPAP   . Sleep apnea    Past Surgical History:  Procedure Laterality Date  . COLONOSCOPY N/A 09/16/2015   Procedure: COLONOSCOPY;  Surgeon: Daneil Dolin, MD;  Location[facility];  Service: Endoscopy;  Laterality: N/A;  8:15 AM - pt has c pap  . ENDOVENOUS ABLATION SAPHENOUS VEIN W/ LASER Right 08/26/2017   endovenous laser ablation right greater saphenous vein by Tinnie Gens MD   . stab phlebectomy  Right 01/07/2018   stab phlebectomy 10-20 incisions right leg by Tinnie Gens MD    Current Outpatient Medications on File Prior to Visit  Medication Sig Dispense Refill  . Accu-Chek FastClix Lancets MISC Check BS QAM - DX: E11.9 100 each 3  . atorvastatin (LIPITOR) 20 MG tablet TAKE 1 TABLET BY MOUTH EVERY DAY AT 6:00PM 90 tablet 1  . Blood Glucose Monitoring Suppl (ACCU-CHEK AVIVA PLUS) w/Device KIT Check BS QAM - DX: E11.9 1 kit 0  . erythromycin ophthalmic ointment Place 1 application into both eyes in the morning and at bedtime. 3.5 g 0  . furosemide (LASIX) 40 MG tablet Take 1 tablet (40 mg total) by mouth 2 (two) times daily. 60 tablet 3  . glipiZIDE (GLUCOTROL) 10 MG tablet TAKE 1 TABLET BY MOUTH EVERY DAY BEFORE breakfast 30 tablet 0  . GLYXAMBI 25-5 MG TABS TAKE 1 TABLET BY MOUTH DAILY 30 tablet 0  . Lancets Misc. (ACCU-CHEK FASTCLIX LANCET) KIT Check BS QAM - DX: E11.9 1 kit 0  . losartan (COZAAR) 50 MG tablet TAKE 1 TABLET BY MOUTH DAILY 90 tablet 3  . metFORMIN (GLUCOPHAGE) 1000 MG tablet TAKE 1 TABLET BY MOUTH TWICE DAILY WITH a meal 60 tablet 2  . nicotine (NICODERM CQ) 21 mg/24hr patch Place 1 patch (21 mg total) onto the skin daily. 28 patch 1  .  ONETOUCH ULTRA test strip USE TO CHECK BLOOD SUGAR EVERY MORNING 100 strip 12   No current facility-administered medications on file prior to visit.   No Known Allergies Social History   Socioeconomic History  . Marital status: Divorced    Spouse name: Not on file  . Number of children: 2  . Years of education: Not on file  . Highest education level: Not on file  Occupational History  . Occupation: Self Employed  Tobacco Use  . Smoking status: Current Every Day Smoker    Packs/day: 0.50    Years: 30.00    Pack years: 15.00    Types: Cigarettes  . Smokeless tobacco: Never Used  Substance and Sexual  Activity  . Alcohol use: Yes    Comment: occasional  . Drug use: No  . Sexual activity: Not on file    Comment: divorced, 2 daughters.  works Psychologist, occupational for fairs, Social research officer, government.  Other Topics Concern  . Not on file  Social History Narrative  . Not on file   Social Determinants of Health   Financial Resource Strain: Not on file  Food Insecurity: Not on file  Transportation Needs: Not on file  Physical Activity: Not on file  Stress: Not on file  Social Connections: Not on file  Intimate Partner Violence: Not on file     Review of Systems  All other systems reviewed and are negative.      Objective:   Physical Exam Vitals reviewed.  Constitutional:      General: He is not in acute distress.    Appearance: Normal appearance. He is obese. He is not ill-appearing or toxic-appearing.  HENT:     Head: Normocephalic and atraumatic.     Right Ear: Tympanic membrane and ear canal normal.     Left Ear: Tympanic membrane and ear canal normal.     Nose: Nose normal. No congestion or rhinorrhea.     Mouth/Throat:     Mouth: Mucous membranes are moist.     Pharynx: Oropharynx is clear. No oropharyngeal exudate or posterior oropharyngeal erythema.  Eyes:     General: No scleral icterus.       Right eye: No discharge.        Left eye: No discharge.     Extraocular  Movements: Extraocular movements intact.     Conjunctiva/sclera: Conjunctivae normal.     Pupils: Pupils are equal, round, and reactive to light.  Neck:     Vascular: No carotid bruit.  Cardiovascular:     Rate and Rhythm: Normal rate and regular rhythm.     Pulses: Normal pulses.     Heart sounds: Normal heart sounds. No murmur heard. No friction rub. No gallop.   Pulmonary:     Effort: Pulmonary effort is normal. No respiratory distress.     Breath sounds: No stridor. Wheezing and rhonchi present. No rales.  Chest:     Chest wall: No tenderness.  Abdominal:     General: Abdomen is flat. Bowel sounds are normal. There is no distension.     Palpations: Abdomen is soft. There is no mass.     Tenderness: There is no abdominal tenderness. There is no guarding or rebound.     Hernia: No hernia is present.  Musculoskeletal:     Cervical back: Normal range of motion and neck supple.  Lymphadenopathy:     Cervical: No cervical adenopathy.  Skin:    Coloration: Skin is not jaundiced.     Findings: No bruising, erythema, lesion or rash.  Neurological:     General: No focal deficit present.     Mental Status: He is alert and oriented to person, place, and time.     Cranial Nerves: No cranial nerve deficit.     Sensory: No sensory deficit.     Motor: No weakness.     Coordination: Coordination normal.     Gait: Gait normal.     Deep Tendon Reflexes: Reflexes normal.  Psychiatric:        Mood and Affect: Mood normal.        Behavior: Behavior normal.        Thought Content: Thought content normal.  Judgment: Judgment normal.               Assessment & Pl  Acute pulmonary edema (HCC)  Start Lasix 40 mg twice daily and recheck weight next week.  I believe the patient needs to diurese 7 to 10 pounds.  We need to define the optimal dose of Lasix for this patient.  Also recommended restricting his salt intake and also limiting his water consumption to 4 bottles of  water.

## 2020-11-22 ENCOUNTER — Telehealth: Payer: Self-pay | Admitting: Family Medicine

## 2020-11-22 DIAGNOSIS — J449 Chronic obstructive pulmonary disease, unspecified: Secondary | ICD-10-CM

## 2020-11-22 NOTE — Telephone Encounter (Signed)
Pt called needing a new order for oxygen and cpap supplies to be sent to [facility] in [facility]. Please fax new orders to  [phone]

## 2020-11-25 NOTE — Telephone Encounter (Signed)
Re-faxed order to requested number.

## 2020-11-28 NOTE — Telephone Encounter (Signed)
Steven Henry from [facility] called. She states that they did get the order for oxygen however she states that their was no instructions on the order. If this is for night time only , how many liters etc. She also states that the sleep study we sent was from 2014 and that they aren't able to use that one. She states when you are using a sleep study for oxygen it must have been done within the last 30 days.  CB# [phone]

## 2020-11-29 DIAGNOSIS — E119 Type 2 diabetes mellitus without complications: Secondary | ICD-10-CM | POA: Diagnosis not present

## 2020-11-29 DIAGNOSIS — H2513 Age-related nuclear cataract, bilateral: Secondary | ICD-10-CM | POA: Diagnosis not present

## 2020-11-29 DIAGNOSIS — H524 Presbyopia: Secondary | ICD-10-CM | POA: Diagnosis not present

## 2020-11-29 DIAGNOSIS — H5203 Hypermetropia, bilateral: Secondary | ICD-10-CM | POA: Diagnosis not present

## 2020-11-29 LAB — HM DIABETES EYE EXAM

## 2020-11-30 DIAGNOSIS — U071 COVID-19: Secondary | ICD-10-CM | POA: Diagnosis not present

## 2020-11-30 NOTE — Telephone Encounter (Signed)
Please advise 

## 2020-12-01 DIAGNOSIS — J449 Chronic obstructive pulmonary disease, unspecified: Secondary | ICD-10-CM | POA: Diagnosis not present

## 2020-12-01 DIAGNOSIS — G4733 Obstructive sleep apnea (adult) (pediatric): Secondary | ICD-10-CM | POA: Diagnosis not present

## 2020-12-01 NOTE — Telephone Encounter (Signed)
Please contact Mickel Baas at [facility].Need more documentation if the oxygen needs to be frequency or nontural.   #[phone]  Fax # [phone]

## 2020-12-01 NOTE — Telephone Encounter (Signed)
Referral orders placed for new sleep study.   New order faxed to [facility] for Oxygen.

## 2020-12-01 NOTE — Addendum Note (Signed)
Addended by: Sheral Flow on: 12/01/2020 09:26 AM   Modules accepted: Orders

## 2020-12-01 NOTE — Telephone Encounter (Signed)
2l at night via [facility], Do they need a new sleep study for the oxygen at night?  If so, please schedule.

## 2020-12-02 NOTE — Telephone Encounter (Signed)
Awaiting sleep study to qualify for CPAP and O2.

## 2020-12-12 ENCOUNTER — Telehealth: Payer: Self-pay

## 2020-12-12 NOTE — Telephone Encounter (Signed)
Called pt, left message for pt to return call to discuss sleep study and getting appt. The original referral will be cancelled until pt returns call per [facility]

## 2020-12-26 ENCOUNTER — Other Ambulatory Visit: Payer: Self-pay | Admitting: Family Medicine

## 2020-12-31 DIAGNOSIS — U071 COVID-19: Secondary | ICD-10-CM | POA: Diagnosis not present

## 2021-01-25 ENCOUNTER — Other Ambulatory Visit: Payer: Self-pay | Admitting: Family Medicine

## 2021-01-25 DIAGNOSIS — E1100 Type 2 diabetes mellitus with hyperosmolarity without nonketotic hyperglycemic-hyperosmolar coma (NKHHC): Secondary | ICD-10-CM

## 2021-01-30 DIAGNOSIS — U071 COVID-19: Secondary | ICD-10-CM | POA: Diagnosis not present

## 2021-02-26 ENCOUNTER — Other Ambulatory Visit: Payer: Self-pay | Admitting: Family Medicine

## 2021-02-26 DIAGNOSIS — E1100 Type 2 diabetes mellitus with hyperosmolarity without nonketotic hyperglycemic-hyperosmolar coma (NKHHC): Secondary | ICD-10-CM

## 2021-03-02 DIAGNOSIS — U071 COVID-19: Secondary | ICD-10-CM | POA: Diagnosis not present

## 2021-03-27 ENCOUNTER — Other Ambulatory Visit: Payer: Self-pay | Admitting: Family Medicine

## 2021-04-01 DIAGNOSIS — U071 COVID-19: Secondary | ICD-10-CM | POA: Diagnosis not present

## 2021-04-10 ENCOUNTER — Other Ambulatory Visit: Payer: Self-pay | Admitting: Family Medicine

## 2021-04-10 DIAGNOSIS — E1100 Type 2 diabetes mellitus with hyperosmolarity without nonketotic hyperglycemic-hyperosmolar coma (NKHHC): Secondary | ICD-10-CM

## 2021-04-26 ENCOUNTER — Other Ambulatory Visit: Payer: Self-pay | Admitting: Family Medicine

## 2021-05-02 DIAGNOSIS — U071 COVID-19: Secondary | ICD-10-CM | POA: Diagnosis not present

## 2021-05-24 ENCOUNTER — Other Ambulatory Visit: Payer: Self-pay | Admitting: Family Medicine

## 2021-05-26 ENCOUNTER — Other Ambulatory Visit: Payer: Self-pay | Admitting: Family Medicine

## 2021-06-02 DIAGNOSIS — U071 COVID-19: Secondary | ICD-10-CM | POA: Diagnosis not present

## 2021-06-19 DIAGNOSIS — G4733 Obstructive sleep apnea (adult) (pediatric): Secondary | ICD-10-CM | POA: Diagnosis not present

## 2021-06-25 ENCOUNTER — Other Ambulatory Visit: Payer: Self-pay | Admitting: Family Medicine

## 2021-07-02 DIAGNOSIS — U071 COVID-19: Secondary | ICD-10-CM | POA: Diagnosis not present

## 2021-08-02 DIAGNOSIS — U071 COVID-19: Secondary | ICD-10-CM | POA: Diagnosis not present

## 2021-08-14 ENCOUNTER — Other Ambulatory Visit: Payer: Self-pay | Admitting: Family Medicine

## 2021-08-15 ENCOUNTER — Other Ambulatory Visit: Payer: Self-pay

## 2021-08-23 ENCOUNTER — Other Ambulatory Visit: Payer: Self-pay | Admitting: Family Medicine

## 2021-08-23 DIAGNOSIS — E1100 Type 2 diabetes mellitus with hyperosmolarity without nonketotic hyperglycemic-hyperosmolar coma (NKHHC): Secondary | ICD-10-CM

## 2021-09-01 DIAGNOSIS — J9601 Acute respiratory failure with hypoxia: Secondary | ICD-10-CM | POA: Diagnosis not present

## 2021-09-03 ENCOUNTER — Other Ambulatory Visit: Payer: Self-pay | Admitting: Family Medicine

## 2021-09-15 ENCOUNTER — Other Ambulatory Visit: Payer: Self-pay

## 2021-09-15 ENCOUNTER — Ambulatory Visit (INDEPENDENT_AMBULATORY_CARE_PROVIDER_SITE_OTHER): Payer: Medicare Other | Admitting: Family Medicine

## 2021-09-15 VITALS — BP 138/82 | HR 88 | Temp 97.6°F | Resp 18 | Ht 71.0 in | Wt 317.0 lb

## 2021-09-15 DIAGNOSIS — N183 Chronic kidney disease, stage 3 unspecified: Secondary | ICD-10-CM

## 2021-09-15 DIAGNOSIS — E7800 Pure hypercholesterolemia, unspecified: Secondary | ICD-10-CM

## 2021-09-15 DIAGNOSIS — Z125 Encounter for screening for malignant neoplasm of prostate: Secondary | ICD-10-CM | POA: Diagnosis not present

## 2021-09-15 DIAGNOSIS — E1100 Type 2 diabetes mellitus with hyperosmolarity without nonketotic hyperglycemic-hyperosmolar coma (NKHHC): Secondary | ICD-10-CM

## 2021-09-15 DIAGNOSIS — J449 Chronic obstructive pulmonary disease, unspecified: Secondary | ICD-10-CM

## 2021-09-15 DIAGNOSIS — G4733 Obstructive sleep apnea (adult) (pediatric): Secondary | ICD-10-CM | POA: Diagnosis not present

## 2021-09-15 NOTE — Progress Notes (Signed)
Subjective:    Patient ID: Steven Henry, male    DOB: December 13, 1950, 70 y.o.   MRN: [id number] Patient continues to have hypoxia.  Today he is 89% on room air.  He continues to smoke.  He is not currently on any medication for emphysema.  He also has history of congestive heart failure having been admitted with acute pulmonary edema in the past.  He has diabetes mellitus.  He is compliant with his medication.  He reports sugars that are typically under 130 in the morning.  However he states that he also will have sugars as high as 170.  He denies any hypoglycemic episodes.  He denies any chest pain.  He does have some shortness of breath with activity.  He denies any purulent sputum or hemoptysis or pleurisy.  He does report a productive cough every morning but that has been chronic for years.  He has chronic neuropathy in his feet.  He states that the bottom of his feet are completely numb.  He denies any burning or stinging pain in his feet.  He denies any wounds on his feet. Immunization History  Administered Date(s) Administered   Tdap 02/03/2019    Past Medical History:  Diagnosis Date   CKD (chronic kidney disease), stage III ([place])    Colon polyps    Diabetes mellitus type II, uncontrolled ([place])    Diabetes mellitus without complication ([place])    Hypertension    Morbid obesity ([place])    PVD (peripheral vascular disease) ([place])    right femoral artery   Sleep apnea    not on CPAP    Sleep apnea    Past Surgical History:  Procedure Laterality Date   COLONOSCOPY N/A 09/16/2015   Procedure: COLONOSCOPY;  Surgeon: Daneil Dolin, MD;  Location[facility];  Service: Endoscopy;  Laterality: N/A;  8:15 AM - pt has c pap   ENDOVENOUS ABLATION SAPHENOUS VEIN W/ LASER Right 08/26/2017   endovenous laser ablation right greater saphenous vein by Tinnie Gens MD    stab phlebectomy  Right 01/07/2018   stab phlebectomy 10-20 incisions right leg by Tinnie Gens MD    Current Outpatient  Medications on File Prior to Visit  Medication Sig Dispense Refill   Accu-Chek FastClix Lancets MISC Check BS QAM - DX: E11.9 100 each 3   atorvastatin (LIPITOR) 20 MG tablet TAKE 1 TABLET BY MOUTH EVERY DAY AT 6:00PM 90 tablet 1   Blood Glucose Monitoring Suppl (ONE TOUCH ULTRA 2) w/Device KIT USETO CHECK BLOOD SUGAR EVERY MORNING 1 kit 0   Cholecalciferol (VITAMIN D3) 10 MCG (400 UNIT) tablet Take by mouth.     erythromycin ophthalmic ointment Place 1 application into both eyes in the morning and at bedtime. 3.5 g 0   furosemide (LASIX) 40 MG tablet TAKE 1 TABLET BY MOUTH TWICE DAILY 60 tablet 3   glipiZIDE (GLUCOTROL) 10 MG tablet TAKE 1 TABLET BY MOUTH EVERY DAY BEFORE breakfast 90 tablet 0   GLYXAMBI 25-5 MG TABS TAKE 1 TABLET BY MOUTH DAILY 90 tablet 2   Lancets Misc. (ACCU-CHEK FASTCLIX LANCET) KIT Check BS QAM - DX: E11.9 1 kit 0   losartan (COZAAR) 50 MG tablet TAKE 1 TABLET BY MOUTH DAILY 90 tablet 3   metFORMIN (GLUCOPHAGE) 1000 MG tablet TAKE 1 TABLET BY MOUTH TWICE DAILY WITH a meal 60 tablet 2   nicotine (NICODERM CQ) 21 mg/24hr patch Place 1 patch (21 mg total) onto the skin daily. [place]  patch 1   ONETOUCH ULTRA test strip USE TO CHECK BLOOD SUGAR EVERY MORNING 100 strip 12   No current facility-administered medications on file prior to visit.   No Known Allergies Social History   Socioeconomic History   Marital status: Divorced    Spouse name: Not on file   Number of children: 2   Years of education: Not on file   Highest education level: Not on file  Occupational History   Occupation: Self Employed  Tobacco Use   Smoking status: Every Day    Packs/day: 0.50    Years: 30.00    Pack years: 15.00    Types: Cigarettes   Smokeless tobacco: Never  Substance and Sexual Activity   Alcohol use: Yes    Comment: occasional   Drug use: No   Sexual activity: Not on file    Comment: divorced, 2 daughters.  works Psychologist, occupational for fairs, Social research officer, government.  Other Topics Concern    Not on file  Social History Narrative   Not on file   Social Determinants of Health   Financial Resource Strain: Not on file  Food Insecurity: Not on file  Transportation Needs: Not on file  Physical Activity: Not on file  Stress: Not on file  Social Connections: Not on file  Intimate Partner Violence: Not on file     Review of Systems  All other systems reviewed and are negative.     Objective:   Physical Exam Vitals reviewed.  Constitutional:      General: He is not in acute distress.    Appearance: Normal appearance. He is obese. He is not ill-appearing or toxic-appearing.  HENT:     Head: Normocephalic and atraumatic.     Right Ear: Tympanic membrane and ear canal normal.     Left Ear: Tympanic membrane and ear canal normal.     Nose: Nose normal. No congestion or rhinorrhea.     Mouth/Throat:     Mouth: Mucous membranes are moist.     Pharynx: Oropharynx is clear. No oropharyngeal exudate or posterior oropharyngeal erythema.  Eyes:     General: No scleral icterus.       Right eye: No discharge.        Left eye: No discharge.     Extraocular Movements: Extraocular movements intact.     Conjunctiva/sclera: Conjunctivae normal.     Pupils: Pupils are equal, round, and reactive to light.  Neck:     Vascular: No carotid bruit.  Cardiovascular:     Rate and Rhythm: Normal rate and regular rhythm.     Pulses: Normal pulses.     Heart sounds: Normal heart sounds. No murmur heard.   No friction rub. No gallop.  Pulmonary:     Effort: Pulmonary effort is normal. No respiratory distress.     Breath sounds: No stridor. Wheezing and rhonchi present. No rales.  Chest:     Chest wall: No tenderness.  Abdominal:     General: Abdomen is flat. Bowel sounds are normal. There is no distension.     Palpations: Abdomen is soft. There is no mass.     Tenderness: There is no abdominal tenderness. There is no guarding or rebound.     Hernia: No hernia is present.   Musculoskeletal:     Cervical back: Normal range of motion and neck supple.  Lymphadenopathy:     Cervical: No cervical adenopathy.  Skin:    Coloration: Skin is not jaundiced.  Findings: No bruising, erythema, lesion or rash.  Neurological:     General: No focal deficit present.     Mental Status: He is alert and oriented to person, place, and time.     Cranial Nerves: No cranial nerve deficit.     Sensory: No sensory deficit.     Motor: No weakness.     Coordination: Coordination normal.     Gait: Gait normal.     Deep Tendon Reflexes: Reflexes normal.  Psychiatric:        Mood and Affect: Mood normal.        Behavior: Behavior normal.        Thought Content: Thought content normal.        Judgment: Judgment normal.              Assessment & Pl  Uncontrolled type 2 diabetes mellitus with hyperosmolarity without coma, without long-term current use of insulin (HCC) - Plan: Hemoglobin A1c, CBC with Differential/Platelet, COMPLETE METABOLIC PANEL WITH GFR, Microalbumin, urine, Hemoglobin A1c  Prostate cancer screening - Plan: PSA  OSA and COPD overlap syndrome (HCC)  Pure hypercholesterolemia  Stage 3 chronic kidney disease, unspecified whether stage 3a or 3b CKD ([place])  Morbid obesity due to excess calories ([place]) I gave the patient samples of Trelegy 1 inhalation today.  I gave him enough for 1 month.  I asked him to call me back and let me know if this helps his breathing.  Continue to encourage smoking cessation.  I am happy with his blood pressure today.  He does not appear fluid overloaded on exam.  I will check an A1c.  Goal A1c is less than 7.  He is also overdue for prostate cancer screening.  I will check a fasting lipid panel.  Ideally I would like his LDL cholesterol to be below 100.

## 2021-09-16 LAB — COMPLETE METABOLIC PANEL WITH GFR
AG Ratio: 1.2 (calc) (ref 1.0–2.5)
ALT: 13 U/L (ref 9–46)
AST: 17 U/L (ref 10–35)
Albumin: 4.1 g/dL (ref 3.6–5.1)
Alkaline phosphatase (APISO): 53 U/L (ref 35–144)
BUN: 18 mg/dL (ref 7–25)
CO2: 25 mmol/L (ref 20–32)
Calcium: 9.1 mg/dL (ref 8.6–10.3)
Chloride: 101 mmol/L (ref 98–110)
Creat: 1 mg/dL (ref 0.70–1.28)
Globulin: 3.4 g/dL (calc) (ref 1.9–3.7)
Glucose, Bld: 168 mg/dL — ABNORMAL HIGH (ref 65–99)
Potassium: 4.4 mmol/L (ref 3.5–5.3)
Sodium: 139 mmol/L (ref 135–146)
Total Bilirubin: 0.4 mg/dL (ref 0.2–1.2)
Total Protein: 7.5 g/dL (ref 6.1–8.1)
eGFR: 81 mL/min/{1.73_m2} (ref >=60)

## 2021-09-16 LAB — CBC WITH DIFFERENTIAL/PLATELET
Absolute Monocytes: 783 cells/uL (ref 200–950)
Basophils Absolute: 63 cells/uL (ref 0–200)
Basophils Relative: 0.7 %
Eosinophils Absolute: 162 cells/uL (ref 15–500)
Eosinophils Relative: 1.8 %
HCT: 54.3 % — ABNORMAL HIGH (ref 38.5–50.0)
Hemoglobin: 17.9 g/dL — ABNORMAL HIGH (ref 13.2–17.1)
Lymphs Abs: 2457 cells/uL (ref 850–3900)
MCH: 30.7 pg (ref 27.0–33.0)
MCHC: 33 g/dL (ref 32.0–36.0)
MCV: 93.1 fL (ref 80.0–100.0)
MPV: 12.5 fL (ref 7.5–12.5)
Monocytes Relative: 8.7 %
Neutro Abs: 5535 cells/uL (ref 1500–7800)
Neutrophils Relative %: 61.5 %
Platelets: 160 10*3/uL (ref 140–400)
RBC: 5.83 10*6/uL — ABNORMAL HIGH (ref 4.20–5.80)
RDW: 13.1 % (ref 11.0–15.0)
Total Lymphocyte: 27.3 %
WBC: 9 10*3/uL (ref 3.8–10.8)

## 2021-09-16 LAB — LIPID PANEL
Cholesterol: 192 mg/dL (ref <200)
HDL: 57 mg/dL (ref >=40)
LDL Cholesterol (Calc): 119 mg/dL (calc) — ABNORMAL HIGH
Non-HDL Cholesterol (Calc): 135 mg/dL (calc) — ABNORMAL HIGH (ref <130)
Total CHOL/HDL Ratio: 3.4 (calc) (ref <5.0)
Triglycerides: 66 mg/dL (ref <150)

## 2021-09-16 LAB — HEMOGLOBIN A1C
Hgb A1c MFr Bld: 10.1 % of total Hgb — ABNORMAL HIGH (ref <5.7)
Mean Plasma Glucose: 243 mg/dL
eAG (mmol/L): 13.5 mmol/L

## 2021-09-16 LAB — MICROALBUMIN, URINE: Microalb, Ur: 18.9 mg/dL

## 2021-09-16 LAB — PSA: PSA: 0.51 ng/mL (ref <=4.00)

## 2021-09-19 ENCOUNTER — Telehealth: Payer: Self-pay

## 2021-09-19 MED ORDER — ATORVASTATIN CALCIUM 40 MG PO TABS: 40.0000 mg | ORAL_TABLET | Freq: Every day | ORAL | 3 refills | Status: DC

## 2021-09-19 MED ORDER — INSULIN GLARGINE 100 UNIT/ML ~~LOC~~ SOLN: 15.0000 [IU] | Freq: Every day | SUBCUTANEOUS | 11 refills | Status: DC

## 2021-09-19 MED ORDER — INSULIN STARTER KIT- SYRINGES (ENGLISH): 1.0000 | Freq: Once | 0 refills | Status: AC

## 2021-09-19 NOTE — Telephone Encounter (Signed)
-----   Message from [name], MD sent at 09/19/2021  6:41 AM EST ----- A1c is out of control.  I would add insulin, lantus 15 units sq daily and check blood sugar everyday.  Increase insulin 1 unit daily until fbs are under 130.  Recheck via telephone in 1 week to update [place] on sugars and insulin to make adjustment.  Up atorvastatin to 40 mg a day.

## 2021-09-19 NOTE — Addendum Note (Signed)
Addended by: Wadie Lessen on: 09/19/2021 04:48 PM   Modules accepted: Orders

## 2021-09-19 NOTE — Telephone Encounter (Signed)
Left message for patient to call back regarding results and recommendations. °

## 2021-09-21 DIAGNOSIS — G4733 Obstructive sleep apnea (adult) (pediatric): Secondary | ICD-10-CM | POA: Diagnosis not present

## 2021-09-22 ENCOUNTER — Other Ambulatory Visit: Payer: Self-pay | Admitting: Family Medicine

## 2021-10-02 DIAGNOSIS — J9601 Acute respiratory failure with hypoxia: Secondary | ICD-10-CM | POA: Diagnosis not present

## 2021-10-04 ENCOUNTER — Other Ambulatory Visit: Payer: Self-pay | Admitting: Family Medicine

## 2021-10-04 DIAGNOSIS — E1100 Type 2 diabetes mellitus with hyperosmolarity without nonketotic hyperglycemic-hyperosmolar coma (NKHHC): Secondary | ICD-10-CM

## 2021-10-25 ENCOUNTER — Encounter: Payer: Self-pay | Admitting: Family Medicine

## 2021-10-25 ENCOUNTER — Other Ambulatory Visit: Payer: Self-pay

## 2021-10-25 ENCOUNTER — Ambulatory Visit (HOSPITAL_COMMUNITY)
Admission: RE | Admit: 2021-10-25 | Discharge: 2021-10-25 | Disposition: A | Discharge status: Home / Self Care | Payer: Medicare Other | Source: Ambulatory Visit | Attending: Nurse Practitioner | Admitting: Nurse Practitioner

## 2021-10-25 ENCOUNTER — Ambulatory Visit (INDEPENDENT_AMBULATORY_CARE_PROVIDER_SITE_OTHER): Payer: Medicare Other | Admitting: Nurse Practitioner

## 2021-10-25 VITALS — BP 120/72 | HR 86 | Wt 314.0 lb

## 2021-10-25 DIAGNOSIS — R0781 Pleurodynia: Secondary | ICD-10-CM

## 2021-10-25 DIAGNOSIS — S299XXA Unspecified injury of thorax, initial encounter: Secondary | ICD-10-CM | POA: Diagnosis not present

## 2021-10-25 NOTE — Progress Notes (Signed)
Subjective:    Patient ID: Steven Henry, male    DOB: 1950/12/10, 71 y.o.   MRN: [id number]  HPI: Steven Henry is a 71 y.o. male presenting for  Chief Complaint  Patient presents with   Fall    Fall on chest and l-arm. C/o chest hurting when coughing   Patient reports on Sunday, he fell on left chest and left arm.  He tripped over a cross tie.  He landed on left rib cage.  Arm was very painful for a couple of days, however is better now.  He reported continued rib pain when he coughs and takes a deep breath.  He is worried because his oxygen saturations at home have been lower than normal.  He has to lay a certain way at night time because of the pain.  He denies bruising, swelling, and discoloration to the area where his ribs hurt.  He reports the pain feels like it is "on the inside."  He denies fevers, shortness of breath, night sweats.    He reports a chronic cough in the morning when he wakes up that he attributes to smoking.  He reports the cough is not worse than normal.  No Known Allergies  Outpatient Encounter Medications as of 10/25/2021  Medication Sig   Accu-Chek FastClix Lancets MISC Check BS QAM - DX: E11.9   atorvastatin (LIPITOR) 40 MG tablet Take 1 tablet (40 mg total) by mouth daily.   Cholecalciferol (VITAMIN D3) 10 MCG (400 UNIT) tablet Take by mouth.   furosemide (LASIX) 40 MG tablet TAKE 1 TABLET BY MOUTH TWICE DAILY   glipiZIDE (GLUCOTROL) 10 MG tablet TAKE 1 TABLET BY MOUTH EVERY DAY BEFORE breakfast   insulin glargine (LANTUS) 100 UNIT/ML injection Inject 0.15 mLs (15 Units total) into the skin daily. PLEASE INCREASE INSULIN BY 1 UNIT DAILY UNTIL FBG IS <130, THEN HOLD AT THAT DOSE   Lancets Misc. (ACCU-CHEK FASTCLIX LANCET) KIT Check BS QAM - DX: E11.9   losartan (COZAAR) 50 MG tablet TAKE 1 TABLET BY MOUTH DAILY   metFORMIN (GLUCOPHAGE) 1000 MG tablet TAKE 1 TABLET BY MOUTH TWICE DAILY WITH a meal   ONETOUCH ULTRA test strip USE TO CHECK BLOOD  SUGAR EVERY MORNING   Blood Glucose Monitoring Suppl (ONE TOUCH ULTRA 2) w/Device KIT USETO CHECK BLOOD SUGAR EVERY MORNING   GLYXAMBI 25-5 MG TABS TAKE 1 TABLET BY MOUTH DAILY   [DISCONTINUED] erythromycin ophthalmic ointment Place 1 application into both eyes in the morning and at bedtime.   [DISCONTINUED] nicotine (NICODERM CQ) 21 mg/24hr patch Place 1 patch (21 mg total) onto the skin daily.   No facility-administered encounter medications on file as of 10/25/2021.    Patient Active Problem List   Diagnosis Date Noted   OSA (obstructive sleep apnea) 08/28/2020   Neuropathy 08/28/2020   Tobacco use 08/28/2020   PVD (peripheral vascular disease) ([place]) 08/28/2020   Varicose veins of right lower extremity with complications 71/02/2693   History of colonic polyps    Diverticulosis of colon without hemorrhage    CKD (chronic kidney disease), stage III ([place])    COPD  GOLD 0  03/05/2013   Hypertension 03/05/2013   Smoker 03/05/2013   Diabetes mellitus type II, uncontrolled    Morbid obesity ([place]) 01/08/2013    Past Medical History:  Diagnosis Date   CKD (chronic kidney disease), stage III (HCC)    Colon polyps    Diabetes mellitus type II, uncontrolled ([place])  Diabetes mellitus without complication ([place])    Hypertension    Morbid obesity ([place])    PVD (peripheral vascular disease) ([place])    right femoral artery   Sleep apnea    not on CPAP    Sleep apnea     Relevant past medical, surgical, family and social history reviewed and updated as indicated. Interim medical history since our last visit reviewed.  Review of Systems Per HPI unless specifically indicated above     Objective:    BP 120/72    Pulse 86    Wt (!) 314 lb (142.4 kg)    SpO2 92%    BMI 43.79 kg/m   Wt Readings from Last 3 Encounters:  10/25/21 (!) 314 lb (142.4 kg)  09/15/21 (!) 317 lb (143.8 kg)  11/18/20 (!) 319 lb (144.7 kg)    Physical Exam Vitals and nursing note reviewed.  Constitutional:       General: He is not in acute distress.    Appearance: Normal appearance. He is obese. He is not ill-appearing, toxic-appearing or diaphoretic.  Cardiovascular:     Rate and Rhythm: Normal rate and regular rhythm.     Heart sounds: No murmur heard. Pulmonary:     Effort: Pulmonary effort is normal.     Breath sounds: Wheezing and rhonchi present. No decreased breath sounds or rales.  Chest:       Comments: Points to area marked as painful "on the inside"; nontender to deep palpation Skin:    General: Skin is warm and dry.     Capillary Refill: Capillary refill takes less than 2 seconds.     Coloration: Skin is not jaundiced or pale.     Findings: No bruising, erythema or rash.  Neurological:     Mental Status: He is alert and oriented to person, place, and time.     Motor: No weakness.     Gait: Gait normal.  Psychiatric:        Mood and Affect: Mood normal.        Behavior: Behavior normal.        Thought Content: Thought content normal.        Judgment: Judgment normal.      Assessment & Plan:  1. Rib pain on left side Acute, s/p fall about 5 days ago.  Suspect rib contusion vs. Fracture.  Discussed timeframe for healing.  Will check x-ray today to rule out lung involvement, however oxygen saturation are at his baseline today and he is not coughing up more than normal.  Samples given for voltaren gel that he can use topically to help with pain.  He can also try ice/heat to help with inflammation.  Follow up with changes in symptoms including fever, increased shortness of breath, increased cough with purulent sputum.    - DG Ribs Unilateral Left; Future    Follow up plan: Return if symptoms worsen or fail to improve.

## 2021-11-01 ENCOUNTER — Encounter: Payer: Self-pay | Admitting: Family Medicine

## 2021-11-02 DIAGNOSIS — J9601 Acute respiratory failure with hypoxia: Secondary | ICD-10-CM | POA: Diagnosis not present

## 2021-11-03 ENCOUNTER — Encounter: Payer: Self-pay | Admitting: Family Medicine

## 2021-11-03 ENCOUNTER — Other Ambulatory Visit: Payer: Self-pay

## 2021-11-03 ENCOUNTER — Ambulatory Visit (INDEPENDENT_AMBULATORY_CARE_PROVIDER_SITE_OTHER): Payer: Medicare Other | Admitting: Family Medicine

## 2021-11-03 VITALS — BP 120/68 | HR 85 | Temp 97.5°F | Resp 18 | Ht 71.0 in | Wt 314.0 lb

## 2021-11-03 DIAGNOSIS — R0781 Pleurodynia: Secondary | ICD-10-CM

## 2021-11-03 MED ORDER — OXYCODONE-ACETAMINOPHEN 7.5-325 MG PO TABS: 1.0000 | ORAL_TABLET | ORAL | 0 refills | Status: DC | PRN

## 2021-11-03 NOTE — Progress Notes (Signed)
Subjective:    Patient ID: Steven Henry, male    DOB: December 12, 1950, 71 y.o.   MRN: [id number] Wt Readings from Last 3 Encounters:  10/25/21 (!) 314 lb (142.4 kg)  09/15/21 (!) 317 lb (143.8 kg)  11/18/20 (!) 319 lb (144.7 kg)    Patient is a 71 year old Caucasian male with a history of diabetes mellitus, smoking, COPD, severe obstructive sleep apnea.  Recently the patient fell and landed over a railroad striking his left anterior chest just below his left breast.  He reports pain and tenderness in the ribs in that area as diagrammed below.  He reports pleurisy.  He does report some orthopnea.  He is having to sleep sitting up.  He has +1 pitting edema in both legs up to his knees.  He has bibasilar crackles in both lungs.  However he is only been taking 1 Lasix a day as opposed to the 40 twice a day that he is supposed to be taking.  He denies any fever or chills or purulent sputum.  He has not smoked a cigarette in a week.  His wheezing is much better than his baseline.  But I am concerned that he may have pulmonary edema.  His breath sounds are symmetric bilaterally so I do not feel that there is a pneumothorax.  He denies any hemoptysis.  Chest x-ray showed no overt fracture. Past Medical History:  Diagnosis Date   CKD (chronic kidney disease), stage III (HCC)    Colon polyps    Diabetes mellitus type II, uncontrolled ([place])    Diabetes mellitus without complication ([place])    Hypertension    Morbid obesity ([place])    PVD (peripheral vascular disease) ([place])    right femoral artery   Sleep apnea    not on CPAP    Sleep apnea    Past Surgical History:  Procedure Laterality Date   COLONOSCOPY N/A 09/16/2015   Procedure: COLONOSCOPY;  Surgeon: Daneil Dolin, MD;  [facility];  Service: Endoscopy;  Laterality: N/A;  8:15 AM - pt has c pap   ENDOVENOUS ABLATION SAPHENOUS VEIN W/ LASER Right 08/26/2017   endovenous laser ablation right greater saphenous vein by  Tinnie Gens MD    stab phlebectomy  Right 01/07/2018   stab phlebectomy 10-20 incisions right leg by Tinnie Gens MD    Current Outpatient Medications on File Prior to Visit  Medication Sig Dispense Refill   Accu-Chek FastClix Lancets MISC Check BS QAM - DX: E11.9 100 each 3   atorvastatin (LIPITOR) 40 MG tablet Take 1 tablet (40 mg total) by mouth daily. 90 tablet 3   Blood Glucose Monitoring Suppl (ONE TOUCH ULTRA 2) w/Device KIT USETO CHECK BLOOD SUGAR EVERY MORNING 1 kit 0   Cholecalciferol (VITAMIN D3) 10 MCG (400 UNIT) tablet Take by mouth.     furosemide (LASIX) 40 MG tablet TAKE 1 TABLET BY MOUTH TWICE DAILY 60 tablet 3   glipiZIDE (GLUCOTROL) 10 MG tablet TAKE 1 TABLET BY MOUTH EVERY DAY BEFORE breakfast 90 tablet 3   GLYXAMBI 25-5 MG TABS TAKE 1 TABLET BY MOUTH DAILY 90 tablet 2   insulin glargine (LANTUS) 100 UNIT/ML injection Inject 0.15 mLs (15 Units total) into the skin daily. PLEASE INCREASE INSULIN BY 1 UNIT DAILY UNTIL FBG IS <130, THEN HOLD AT THAT DOSE 10 mL 11   Lancets Misc. (ACCU-CHEK FASTCLIX LANCET) KIT Check BS QAM - DX: E11.9 1 kit 0   losartan (COZAAR) 50 MG  tablet TAKE 1 TABLET BY MOUTH DAILY 90 tablet 3   metFORMIN (GLUCOPHAGE) 1000 MG tablet TAKE 1 TABLET BY MOUTH TWICE DAILY WITH a meal 180 tablet 3   ONETOUCH ULTRA test strip USE TO CHECK BLOOD SUGAR EVERY MORNING 100 strip 12   No current facility-administered medications on file prior to visit.   No Known Allergies Social History   Socioeconomic History   Marital status: Divorced    Spouse name: Not on file   Number of children: 2   Years of education: Not on file   Highest education level: Not on file  Occupational History   Occupation: Self Employed  Tobacco Use   Smoking status: Every Day    Packs/day: 0.50    Years: 30.00    Pack years: 15.00    Types: Cigarettes   Smokeless tobacco: Never  Substance and Sexual Activity   Alcohol use: Yes    Comment: occasional    Drug use: No   Sexual activity: Not on file    Comment: divorced, 2 daughters.  works Psychologist, occupational for fairs, Social research officer, government.  Other Topics Concern   Not on file  Social History Narrative   Not on file   Social Determinants of Health   Financial Resource Strain: Not on file  Food Insecurity: Not on file  Transportation Needs: Not on file  Physical Activity: Not on file  Stress: Not on file  Social Connections: Not on file  Intimate Partner Violence: Not on file     Review of Systems  All other systems reviewed and are negative.     Objective:   Physical Exam Vitals reviewed.  Constitutional:      General: He is not in acute distress.    Appearance: Normal appearance. He is obese. He is not ill-appearing or toxic-appearing.  HENT:     Head: Normocephalic and atraumatic.  Cardiovascular:     Rate and Rhythm: Normal rate and regular rhythm.     Pulses: Normal pulses.     Heart sounds: Normal heart sounds. No murmur heard.   No friction rub. No gallop.  Pulmonary:     Effort: Pulmonary effort is normal. No respiratory distress.     Breath sounds: Decreased air movement present. No stridor. Examination of the right-lower field reveals rales. Examination of the left-lower field reveals rales. Wheezing and rales present. No rhonchi.  Chest:     Chest wall: Tenderness present. No lacerations, deformity, swelling, crepitus or edema.    Abdominal:     General: Abdomen is flat. Bowel sounds are normal. There is no distension.     Palpations: Abdomen is soft. There is no mass.     Tenderness: There is no abdominal tenderness. There is no guarding or rebound.     Hernia: No hernia is present.  Musculoskeletal:     Right lower leg: Edema present.     Left lower leg: Edema present.  Skin:    Coloration: Skin is not jaundiced.     Findings: No bruising, erythema, lesion or rash.  Neurological:     General: No focal deficit present.     Mental Status: He is alert and oriented  to person, place, and time.     Cranial Nerves: No cranial nerve deficit.     Sensory: No sensory deficit.     Motor: No weakness.     Coordination: Coordination normal.     Gait: Gait normal.     Deep Tendon Reflexes: Reflexes normal.  Psychiatric:  Mood and Affect: Mood normal.        Behavior: Behavior normal.        Thought Content: Thought content normal.        Judgment: Judgment normal.              Assessment & Pl  Rib pain on left side Increase Lasix to 40 mg twice a day as I believe he has some pulmonary edema and fluid overload contributing to his orthopnea.  Add Percocet 7.5/325 1 p.o. every 6 hours as needed pain for rib pain.  Recommended a rib belt/rib support for comfort.  Recheck in 1 week if no better or sooner if worse.

## 2021-11-06 ENCOUNTER — Ambulatory Visit: Payer: Medicare Other | Admitting: Family Medicine

## 2021-11-30 DIAGNOSIS — J9601 Acute respiratory failure with hypoxia: Secondary | ICD-10-CM | POA: Diagnosis not present

## 2021-12-13 IMAGING — DX DG CHEST 2V
3 series · 3 of 3 positions shown · non-contrast
Comparison: 08/28/2020

CLINICAL DATA: Acute pulmonary edema.  Bibasilar crackles.

EXAM:
CHEST - 2 VIEW

[chest pa]
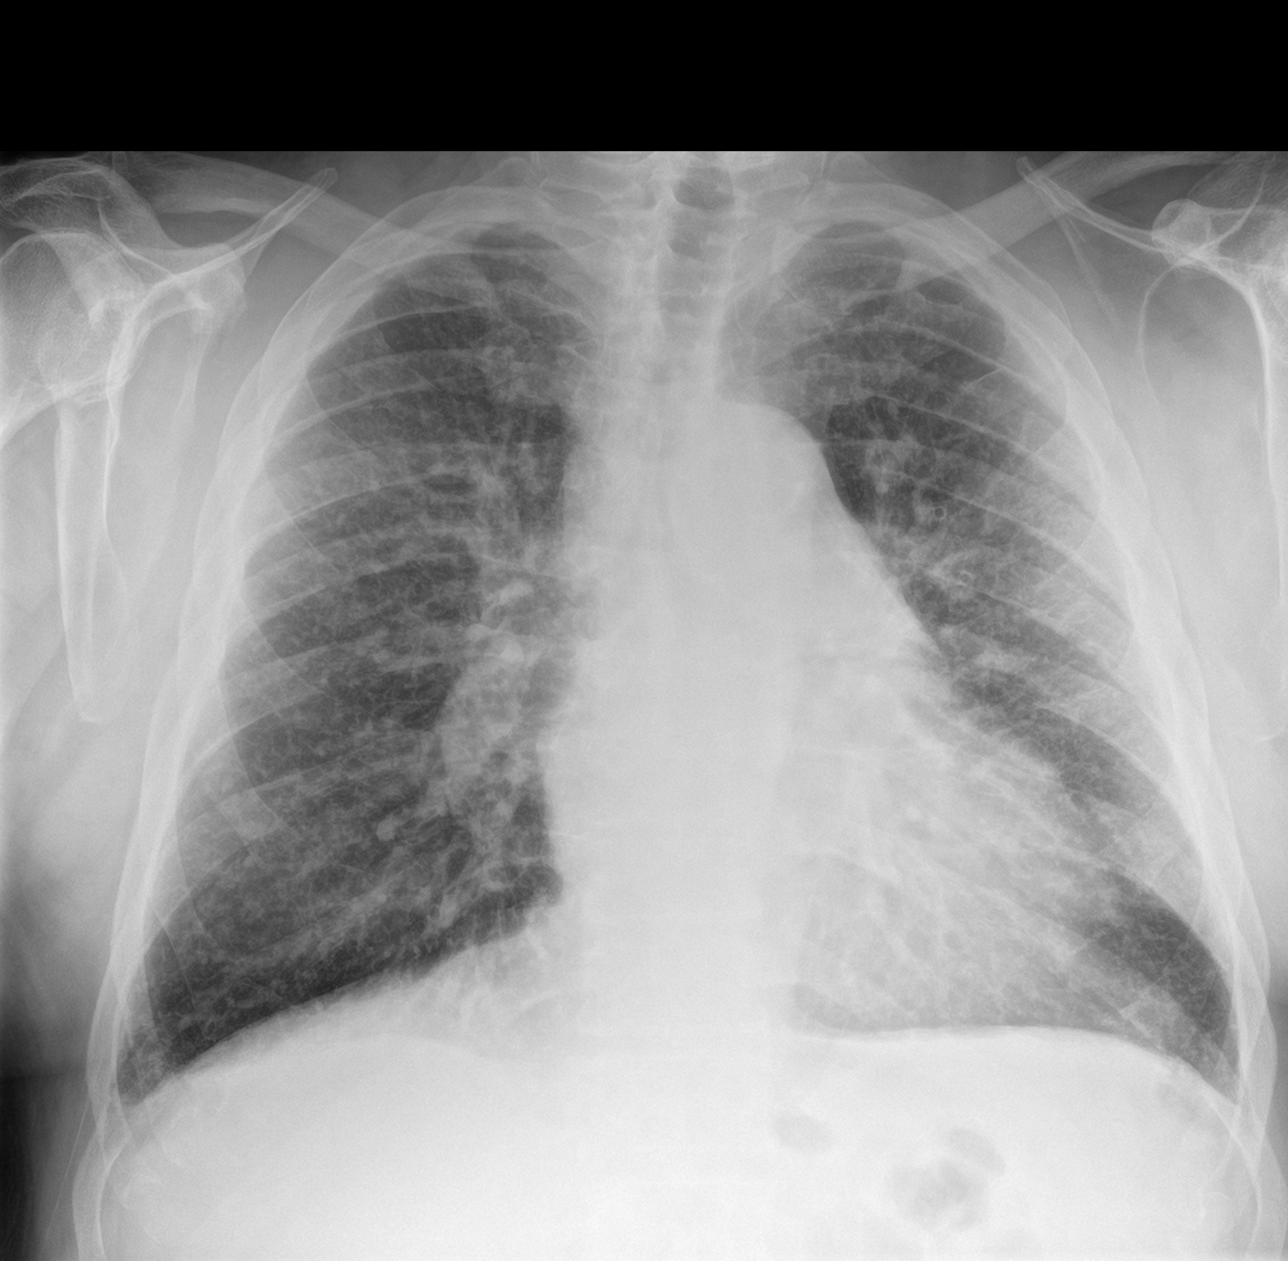

[chest lat (1 of 2)]
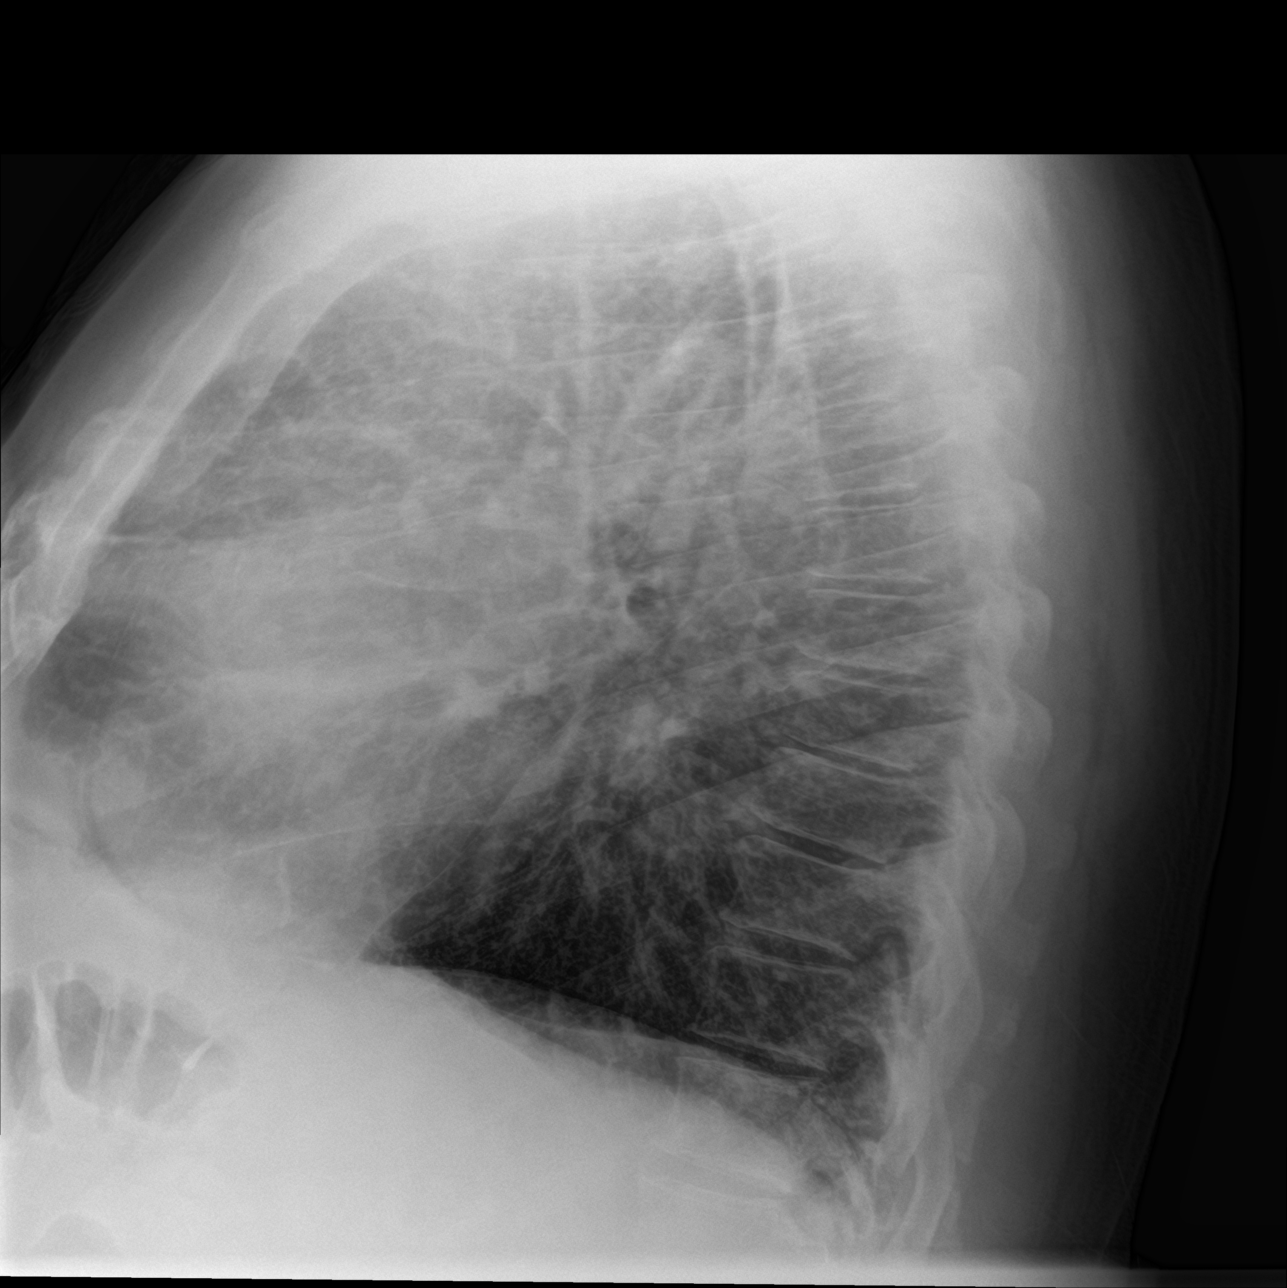

[chest lat (2 of 2)]
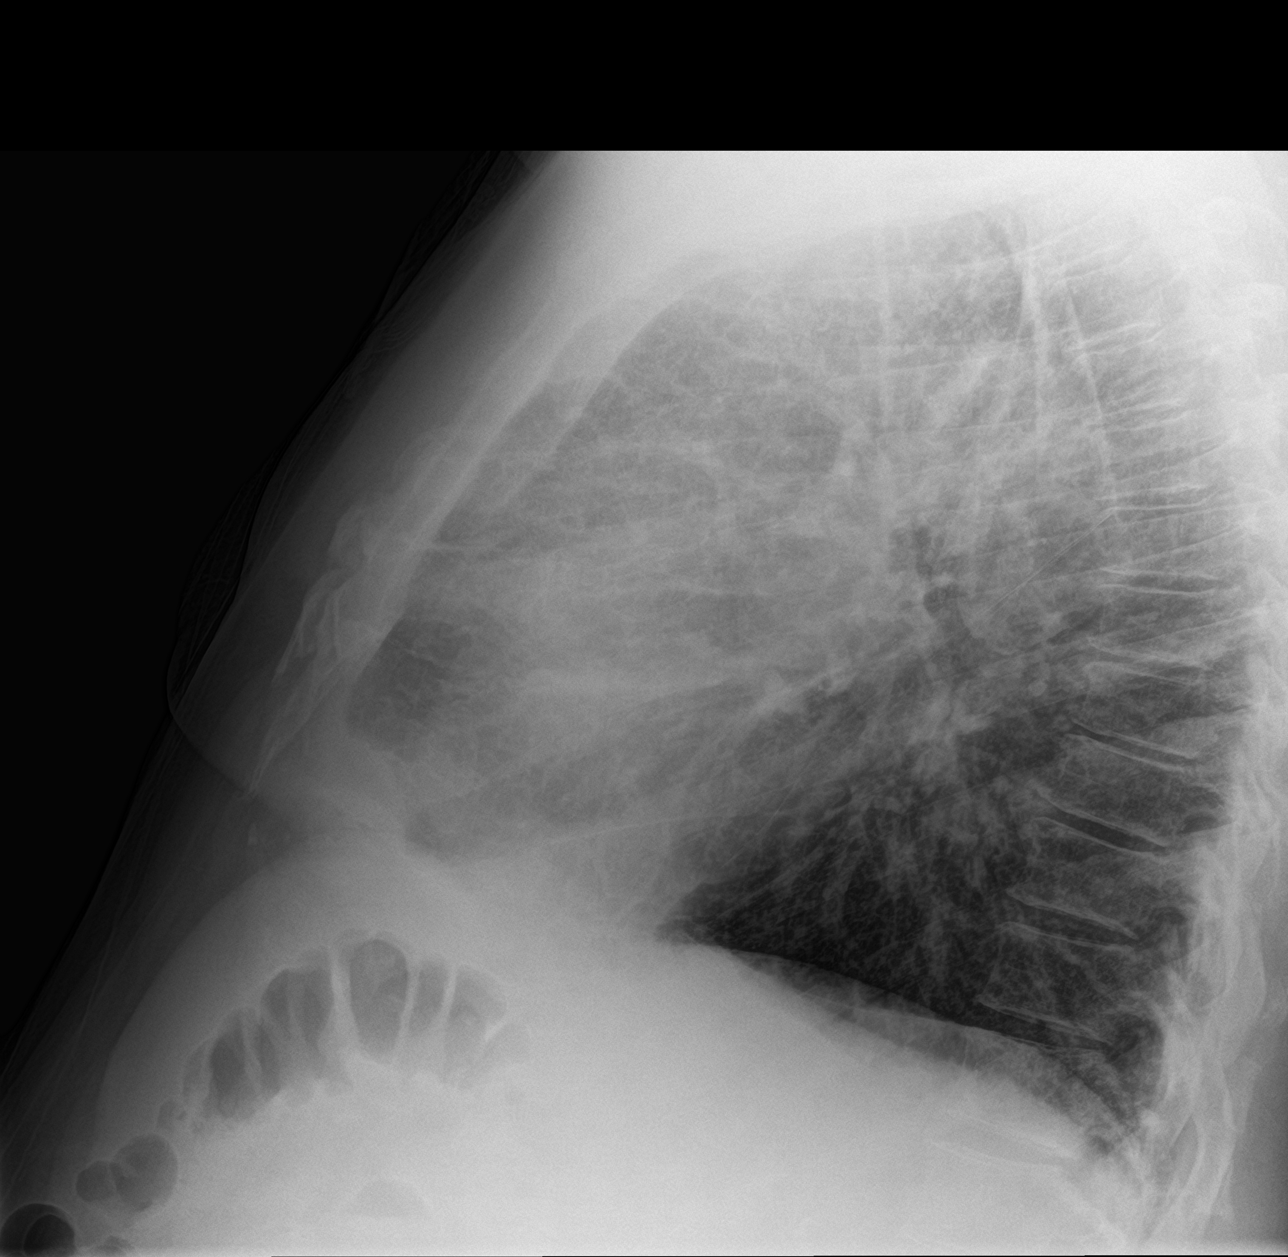

[3 of 3 positions shown; findings below may reference images not displayed]

FINDINGS: Previous right upper extremity PICC has been removed. Upper normal
heart size. Stable mediastinal contours. Vascular congestion with
mild interstitial thickening suspicious for pulmonary edema. No
significant pleural effusion. No focal airspace disease. No
pneumothorax. No acute osseous abnormalities are seen.
IMPRESSION: Worsening vascular congestion. Mild interstitial thickening
suspicious for pulmonary edema, new from August 2020 exam.

## 2021-12-31 DIAGNOSIS — J9601 Acute respiratory failure with hypoxia: Secondary | ICD-10-CM | POA: Diagnosis not present

## 2022-01-04 ENCOUNTER — Other Ambulatory Visit: Payer: Self-pay | Admitting: Family Medicine

## 2022-01-09 ENCOUNTER — Ambulatory Visit: Payer: Medicare Other | Admitting: Family Medicine

## 2022-01-12 ENCOUNTER — Ambulatory Visit (INDEPENDENT_AMBULATORY_CARE_PROVIDER_SITE_OTHER): Payer: Medicare Other | Admitting: Family Medicine

## 2022-01-12 ENCOUNTER — Encounter: Payer: Self-pay | Admitting: Family Medicine

## 2022-01-12 VITALS — BP 132/70 | HR 76 | Temp 99.2°F | Ht 71.0 in | Wt 320.6 lb

## 2022-01-12 DIAGNOSIS — M25562 Pain in left knee: Secondary | ICD-10-CM

## 2022-01-12 NOTE — Progress Notes (Signed)
? ?Subjective:  ? ? Patient ID: Steven Henry, male    DOB: [birth year], 71 y.o.   MRN: [id number] ? ?HPI ? ?Patient reports a sudden onset of left knee pain.  He denies any injuries.  He did fall in December and injured his ribs but he does not recall injuring his knee when that happened.  He is having a difficult time even putting weight on his left leg.  He has edema in both legs but it is equal and symmetric and a chronic problem for him.  There is no asymmetric edema in his left leg compared to his right leg.  The tenderness to palpation is in the knee joint itself.  There is no significant pain with passive range of motion.  He has no laxity to varus or valgus stress.  Due to his body habitus it is very difficult for me to assess for any ACL or PCL tears but his knee joint feels stable.  He denies any pain with Apley grind although he does report pain with active range of motion. ?Past Medical History:  ?Diagnosis Date  ? CKD (chronic kidney disease), stage III ([place])   ? Colon polyps   ? Diabetes mellitus type II, uncontrolled   ? Diabetes mellitus without complication ([place])   ? Hypertension   ? Morbid obesity ([place])   ? PVD (peripheral vascular disease) ([place])   ? right femoral artery  ? Sleep apnea   ? not on CPAP   ? Sleep apnea   ? ?Past Surgical History:  ?Procedure Laterality Date  ? COLONOSCOPY N/A 09/16/2015  ? Procedure: COLONOSCOPY;  Surgeon: Daneil Dolin, MD;  Location: [facility];  Service: Endoscopy;  Laterality: N/A;  8:15 AM - pt has c pap  ? ENDOVENOUS ABLATION SAPHENOUS VEIN W/ LASER Right 08/26/2017  ? endovenous laser ablation right greater saphenous vein by Tinnie Gens MD   ? stab phlebectomy  Right 01/07/2018  ? stab phlebectomy 10-20 incisions right leg by Tinnie Gens MD   ? ?Current Outpatient Medications on File Prior to Visit  ?Medication Sig Dispense Refill  ? Accu-Chek FastClix Lancets MISC Check BS QAM - DX: E11.9 100 each 3  ? atorvastatin (LIPITOR) 40 MG tablet Take 1  tablet (40 mg total) by mouth daily. 90 tablet 3  ? Blood Glucose Monitoring Suppl (ONE TOUCH ULTRA 2) w/Device KIT USETO CHECK BLOOD SUGAR EVERY MORNING 1 kit 0  ? Cholecalciferol (VITAMIN D3) 10 MCG (400 UNIT) tablet Take by mouth.    ? furosemide (LASIX) 40 MG tablet TAKE 1 TABLET BY MOUTH TWICE DAILY 60 tablet 3  ? glipiZIDE (GLUCOTROL) 10 MG tablet TAKE 1 TABLET BY MOUTH EVERY DAY BEFORE breakfast 90 tablet 3  ? GLYXAMBI 25-5 MG TABS TAKE 1 TABLET BY MOUTH DAILY 90 tablet 2  ? insulin glargine (LANTUS) 100 UNIT/ML injection Inject 0.15 mLs (15 Units total) into the skin daily. PLEASE INCREASE INSULIN BY 1 UNIT DAILY UNTIL FBG IS <130, THEN HOLD AT THAT DOSE 10 mL 11  ? Lancets Misc. (ACCU-CHEK FASTCLIX LANCET) KIT Check BS QAM - DX: E11.9 1 kit 0  ? losartan (COZAAR) 50 MG tablet TAKE 1 TABLET BY MOUTH DAILY 90 tablet 3  ? metFORMIN (GLUCOPHAGE) 1000 MG tablet TAKE 1 TABLET BY MOUTH TWICE DAILY WITH a meal 180 tablet 3  ? ONETOUCH ULTRA test strip USE TO CHECK BLOOD SUGAR EVERY MORNING 100 strip 12  ? oxyCODONE-acetaminophen (PERCOCET) 7.5-325 MG tablet Take 1 tablet  by mouth every 4 (four) hours as needed for severe pain. 40 tablet 0  ? ?No current facility-administered medications on file prior to visit.  ? ?No Known Allergies ?Social History  ? ?Socioeconomic History  ? Marital status: Divorced  ?  Spouse name: Not on file  ? Number of children: 2  ? Years of education: Not on file  ? Highest education level: Not on file  ?Occupational History  ? Occupation: Self Employed  ?Tobacco Use  ? Smoking status: Every Day  ?  Packs/day: 0.50  ?  Years: 30.00  ?  Pack years: 15.00  ?  Types: Cigarettes  ? Smokeless tobacco: Never  ?Substance and Sexual Activity  ? Alcohol use: Yes  ?  Comment: occasional  ? Drug use: No  ? Sexual activity: Not on file  ?  Comment: divorced, 2 daughters.  works Psychologist, occupational for fairs, Social research officer, government.  ?Other Topics Concern  ? Not on file  ?Social History Narrative  ? Not on file   ? ?Social Determinants of Health  ? ?Financial Resource Strain: Not on file  ?Food Insecurity: Not on file  ?Transportation Needs: Not on file  ?Physical Activity: Not on file  ?Stress: Not on file  ?Social Connections: Not on file  ?Intimate Partner Violence: Not on file  ? ? ? ?Review of Systems  ?All other systems reviewed and are negative. ? ?   ?Objective:  ? Physical Exam ?Vitals reviewed.  ?Constitutional:   ?   Appearance: He is obese.  ?Cardiovascular:  ?   Rate and Rhythm: Normal rate and regular rhythm.  ?   Heart sounds: Normal heart sounds.  ?Pulmonary:  ?   Effort: Pulmonary effort is normal.  ?   Breath sounds: Normal breath sounds.  ?Musculoskeletal:     ?   General: Tenderness present. No swelling.  ?   Right lower leg: Edema present.  ?   Left lower leg: Tenderness present. No bony tenderness. Edema present.  ?Neurological:  ?   Mental Status: He is alert.  ? ? ? ? ? ?   ?Assessment & Plan:  ? ?Acute pain of left knee ?Based on his age and his elevated BMI, I anticipate the patient has osteoarthritis which she has recently exacerbated.  We discussed NSAIDs however he would like to try cortisone injection.  Using sterile technique, I injected the left knee with 2 cc lidocaine, 2 cc of Marcaine, and 2 cc of 40 mg per mill Kenalog.  The patient tolerated the procedure well without complication.  If knee pain is not improving by next week, we will proceed with imaging of the left knee. ?

## 2022-01-15 NOTE — Telephone Encounter (Signed)
Spoke with pt, told him it was sent to [facility] drug. Voiced understanding ?

## 2022-01-18 ENCOUNTER — Encounter: Payer: Self-pay | Admitting: Family Medicine

## 2022-01-18 ENCOUNTER — Other Ambulatory Visit: Payer: Self-pay | Admitting: Family Medicine

## 2022-01-18 DIAGNOSIS — M25562 Pain in left knee: Secondary | ICD-10-CM

## 2022-01-30 DIAGNOSIS — J9601 Acute respiratory failure with hypoxia: Secondary | ICD-10-CM | POA: Diagnosis not present

## 2022-02-02 ENCOUNTER — Other Ambulatory Visit: Payer: Self-pay | Admitting: Family Medicine

## 2022-02-02 DIAGNOSIS — E1100 Type 2 diabetes mellitus with hyperosmolarity without nonketotic hyperglycemic-hyperosmolar coma (NKHHC): Secondary | ICD-10-CM

## 2022-02-05 DIAGNOSIS — G4733 Obstructive sleep apnea (adult) (pediatric): Secondary | ICD-10-CM | POA: Diagnosis not present

## 2022-02-05 NOTE — Telephone Encounter (Signed)
Dose changed 09/19/21. ?

## 2022-02-23 DIAGNOSIS — I130 Hypertensive heart and chronic kidney disease with heart failure and stage 1 through stage 4 chronic kidney disease, or unspecified chronic kidney disease: Secondary | ICD-10-CM | POA: Diagnosis not present

## 2022-02-23 DIAGNOSIS — I509 Heart failure, unspecified: Secondary | ICD-10-CM | POA: Diagnosis not present

## 2022-02-23 DIAGNOSIS — I517 Cardiomegaly: Secondary | ICD-10-CM | POA: Diagnosis not present

## 2022-02-23 DIAGNOSIS — N1832 Chronic kidney disease, stage 3b: Secondary | ICD-10-CM | POA: Diagnosis not present

## 2022-02-23 DIAGNOSIS — Z79899 Other long term (current) drug therapy: Secondary | ICD-10-CM | POA: Diagnosis not present

## 2022-02-23 DIAGNOSIS — R600 Localized edema: Secondary | ICD-10-CM | POA: Diagnosis not present

## 2022-02-23 DIAGNOSIS — Z7984 Long term (current) use of oral hypoglycemic drugs: Secondary | ICD-10-CM | POA: Diagnosis not present

## 2022-02-23 DIAGNOSIS — Z87891 Personal history of nicotine dependence: Secondary | ICD-10-CM | POA: Diagnosis not present

## 2022-02-23 DIAGNOSIS — J441 Chronic obstructive pulmonary disease with (acute) exacerbation: Secondary | ICD-10-CM | POA: Diagnosis not present

## 2022-02-23 DIAGNOSIS — I4891 Unspecified atrial fibrillation: Secondary | ICD-10-CM | POA: Diagnosis not present

## 2022-02-23 DIAGNOSIS — J811 Chronic pulmonary edema: Secondary | ICD-10-CM | POA: Diagnosis not present

## 2022-02-25 ENCOUNTER — Other Ambulatory Visit: Payer: Self-pay

## 2022-02-25 ENCOUNTER — Encounter (HOSPITAL_COMMUNITY): Payer: Self-pay

## 2022-02-25 ENCOUNTER — Inpatient Hospital Stay (HOSPITAL_COMMUNITY)
Admission: EM | Admit: 2022-02-25 | Discharge: 2022-03-01 | DRG: 291 | Disposition: A | Discharge status: Home / Self Care | Payer: Medicare Other | Attending: Family Medicine | Admitting: Family Medicine

## 2022-02-25 ENCOUNTER — Emergency Department (HOSPITAL_COMMUNITY): Payer: Medicare Other

## 2022-02-25 DIAGNOSIS — I130 Hypertensive heart and chronic kidney disease with heart failure and stage 1 through stage 4 chronic kidney disease, or unspecified chronic kidney disease: Secondary | ICD-10-CM | POA: Diagnosis not present

## 2022-02-25 DIAGNOSIS — K761 Chronic passive congestion of liver: Secondary | ICD-10-CM | POA: Diagnosis present

## 2022-02-25 DIAGNOSIS — T383X6A Underdosing of insulin and oral hypoglycemic [antidiabetic] drugs, initial encounter: Secondary | ICD-10-CM | POA: Diagnosis present

## 2022-02-25 DIAGNOSIS — E1151 Type 2 diabetes mellitus with diabetic peripheral angiopathy without gangrene: Secondary | ICD-10-CM | POA: Diagnosis not present

## 2022-02-25 DIAGNOSIS — E785 Hyperlipidemia, unspecified: Secondary | ICD-10-CM | POA: Diagnosis not present

## 2022-02-25 DIAGNOSIS — J9622 Acute and chronic respiratory failure with hypercapnia: Secondary | ICD-10-CM | POA: Diagnosis present

## 2022-02-25 DIAGNOSIS — Z794 Long term (current) use of insulin: Secondary | ICD-10-CM | POA: Diagnosis not present

## 2022-02-25 DIAGNOSIS — I10 Essential (primary) hypertension: Secondary | ICD-10-CM | POA: Diagnosis present

## 2022-02-25 DIAGNOSIS — E1165 Type 2 diabetes mellitus with hyperglycemia: Secondary | ICD-10-CM | POA: Diagnosis not present

## 2022-02-25 DIAGNOSIS — Z833 Family history of diabetes mellitus: Secondary | ICD-10-CM

## 2022-02-25 DIAGNOSIS — I5032 Chronic diastolic (congestive) heart failure: Secondary | ICD-10-CM | POA: Diagnosis present

## 2022-02-25 DIAGNOSIS — I4819 Other persistent atrial fibrillation: Secondary | ICD-10-CM

## 2022-02-25 DIAGNOSIS — N183 Chronic kidney disease, stage 3 unspecified: Secondary | ICD-10-CM | POA: Diagnosis present

## 2022-02-25 DIAGNOSIS — I739 Peripheral vascular disease, unspecified: Secondary | ICD-10-CM | POA: Diagnosis not present

## 2022-02-25 DIAGNOSIS — Z6841 Body Mass Index (BMI) 40.0 and over, adult: Secondary | ICD-10-CM | POA: Diagnosis not present

## 2022-02-25 DIAGNOSIS — R0602 Shortness of breath: Secondary | ICD-10-CM | POA: Diagnosis not present

## 2022-02-25 DIAGNOSIS — I4891 Unspecified atrial fibrillation: Secondary | ICD-10-CM | POA: Diagnosis not present

## 2022-02-25 DIAGNOSIS — G4733 Obstructive sleep apnea (adult) (pediatric): Secondary | ICD-10-CM | POA: Diagnosis present

## 2022-02-25 DIAGNOSIS — I480 Paroxysmal atrial fibrillation: Secondary | ICD-10-CM | POA: Diagnosis not present

## 2022-02-25 DIAGNOSIS — D696 Thrombocytopenia, unspecified: Secondary | ICD-10-CM | POA: Diagnosis present

## 2022-02-25 DIAGNOSIS — E782 Mixed hyperlipidemia: Secondary | ICD-10-CM | POA: Diagnosis not present

## 2022-02-25 DIAGNOSIS — Z91128 Patient's intentional underdosing of medication regimen for other reason: Secondary | ICD-10-CM

## 2022-02-25 DIAGNOSIS — F17210 Nicotine dependence, cigarettes, uncomplicated: Secondary | ICD-10-CM | POA: Diagnosis not present

## 2022-02-25 DIAGNOSIS — J441 Chronic obstructive pulmonary disease with (acute) exacerbation: Secondary | ICD-10-CM | POA: Diagnosis present

## 2022-02-25 DIAGNOSIS — J9621 Acute and chronic respiratory failure with hypoxia: Secondary | ICD-10-CM | POA: Diagnosis not present

## 2022-02-25 DIAGNOSIS — Z91148 Patient's other noncompliance with medication regimen for other reason: Secondary | ICD-10-CM

## 2022-02-25 DIAGNOSIS — Z720 Tobacco use: Secondary | ICD-10-CM | POA: Diagnosis present

## 2022-02-25 DIAGNOSIS — E1122 Type 2 diabetes mellitus with diabetic chronic kidney disease: Secondary | ICD-10-CM | POA: Diagnosis not present

## 2022-02-25 DIAGNOSIS — I5A Non-ischemic myocardial injury (non-traumatic): Secondary | ICD-10-CM | POA: Diagnosis present

## 2022-02-25 DIAGNOSIS — I509 Heart failure, unspecified: Secondary | ICD-10-CM | POA: Diagnosis not present

## 2022-02-25 DIAGNOSIS — Z8249 Family history of ischemic heart disease and other diseases of the circulatory system: Secondary | ICD-10-CM

## 2022-02-25 DIAGNOSIS — I5033 Acute on chronic diastolic (congestive) heart failure: Secondary | ICD-10-CM | POA: Diagnosis present

## 2022-02-25 DIAGNOSIS — Z79899 Other long term (current) drug therapy: Secondary | ICD-10-CM

## 2022-02-25 DIAGNOSIS — Z7984 Long term (current) use of oral hypoglycemic drugs: Secondary | ICD-10-CM

## 2022-02-25 DIAGNOSIS — I110 Hypertensive heart disease with heart failure: Secondary | ICD-10-CM | POA: Diagnosis not present

## 2022-02-25 HISTORY — DX: Unspecified atrial fibrillation: I48.91

## 2022-02-25 HISTORY — DX: Heart failure, unspecified: I50.9

## 2022-02-25 LAB — CBC
HCT: 53 % — ABNORMAL HIGH (ref 39.0–52.0)
Hemoglobin: 16.7 g/dL (ref 13.0–17.0)
MCH: 31.5 pg (ref 26.0–34.0)
MCHC: 31.5 g/dL (ref 30.0–36.0)
MCV: 99.8 fL (ref 80.0–100.0)
Platelets: 135 10*3/uL — ABNORMAL LOW (ref 150–400)
RBC: 5.31 MIL/uL (ref 4.22–5.81)
RDW: 16 % — ABNORMAL HIGH (ref 11.5–15.5)
WBC: 9.6 10*3/uL (ref 4.0–10.5)
nRBC: 0 % (ref 0.0–0.2)

## 2022-02-25 LAB — TROPONIN I (HIGH SENSITIVITY)
Troponin I (High Sensitivity): 19 ng/L — ABNORMAL HIGH (ref <18)
Troponin I (High Sensitivity): 19 ng/L — ABNORMAL HIGH (ref <18)

## 2022-02-25 LAB — BASIC METABOLIC PANEL
Anion gap: 5 (ref 5–15)
BUN: 25 mg/dL — ABNORMAL HIGH (ref 8–23)
CO2: 33 mmol/L — ABNORMAL HIGH (ref 22–32)
Calcium: 8.4 mg/dL — ABNORMAL LOW (ref 8.9–10.3)
Chloride: 99 mmol/L (ref 98–111)
Creatinine, Ser: 1.21 mg/dL (ref 0.61–1.24)
GFR, Estimated: >60 mL/min (ref >60)
Glucose, Bld: 165 mg/dL — ABNORMAL HIGH (ref 70–99)
Potassium: 3.8 mmol/L (ref 3.5–5.1)
Sodium: 137 mmol/L (ref 135–145)

## 2022-02-25 LAB — BRAIN NATRIURETIC PEPTIDE: B Natriuretic Peptide: 113 pg/mL — ABNORMAL HIGH (ref 0.0–100.0)

## 2022-02-25 MED ORDER — ACETAMINOPHEN 650 MG RE SUPP: 650.0000 mg | Freq: Four times a day (QID) | RECTAL | Status: DC | PRN

## 2022-02-25 MED ORDER — ONDANSETRON HCL 4 MG PO TABS: 4.0000 mg | ORAL_TABLET | Freq: Four times a day (QID) | ORAL | Status: DC | PRN

## 2022-02-25 MED ORDER — ONDANSETRON HCL 4 MG/2ML IJ SOLN: 4.0000 mg | Freq: Four times a day (QID) | INTRAMUSCULAR | Status: DC | PRN

## 2022-02-25 MED ORDER — IPRATROPIUM-ALBUTEROL 0.5-2.5 (3) MG/3ML IN SOLN
3.0000 mL | Freq: Once | RESPIRATORY_TRACT | Status: AC
  Administered 2022-02-25: 3 mL via RESPIRATORY_TRACT
  Filled 2022-02-25: qty 3

## 2022-02-25 MED ORDER — ACETAMINOPHEN 325 MG PO TABS: 650.0000 mg | ORAL_TABLET | Freq: Four times a day (QID) | ORAL | Status: DC | PRN

## 2022-02-25 MED ORDER — FUROSEMIDE 10 MG/ML IJ SOLN
40.0000 mg | Freq: Once | INTRAMUSCULAR | Status: AC
  Administered 2022-02-25: 40 mg via INTRAVENOUS
  Filled 2022-02-25: qty 4

## 2022-02-25 MED ORDER — ENOXAPARIN SODIUM 60 MG/0.6ML IJ SOSY
60.0000 mg | PREFILLED_SYRINGE | INTRAMUSCULAR | Status: DC
  Filled 2022-02-25: qty 0.6

## 2022-02-25 NOTE — ED Provider Notes (Addendum)
[facility] EMERGENCY DEPARTMENT Provider Note   CSN: [id number] Arrival date & time: 02/25/22  1939     History  Chief Complaint  Patient presents with   Congestive Heart Failure    Steven Henry is a 71 y.o. male.  HPI Patient is a 71 year old male with history of morbid obesity, COPD, CKD, OSA, PVD, hypertension, diabetes mellitus, who presents to the emergency department due to swelling and shortness of breath.  History obtained via patient as well as his daughter at bedside.  States that he was recently in the [employer] and became more short of breath about 5 days ago.  He was admitted to the hospital in the [facility] and was diuresed and briefly put on BiPAP.  The vacation was ending so he decided to leave [place] and came back to [place] where he currently resides.  Shortness of breath has persisted so he then came to the emergency department once again for evaluation.  Reports significant swelling through the legs and abdomen as well as worsening shortness of breath.  States that he has been compliant with Lasix which he takes 40 mg of twice per day.  No vomiting.  States that he still smokes.  He wears 2 L via nasal cannula at night with his CPAP.  Otherwise denies an oxygen requirement at home.  States that he used to be seen by Dr. Einar Gip but does not follow with a cardiologist any longer.    Home Medications Prior to Admission medications   Medication Sig Start Date End Date Taking? Authorizing Provider  Accu-Chek FastClix Lancets MISC Check BS QAM - DX: E11.9 02/12/19   [name], MD  atorvastatin (LIPITOR) 40 MG tablet Take 1 tablet (40 mg total) by mouth daily. 09/19/21   [name], MD  Blood Glucose Monitoring Suppl (ONE TOUCH ULTRA 2) w/Device KIT USETO CHECK BLOOD SUGAR EVERY MORNING 05/26/21   [name], MD  Cholecalciferol (VITAMIN D3) 10 MCG (400 UNIT) tablet Take by mouth. 09/02/20   [provider]  furosemide  (LASIX) 40 MG tablet TAKE 1 TABLET BY MOUTH TWICE DAILY 02/05/22   [name], MD  glipiZIDE (GLUCOTROL) 10 MG tablet TAKE 1 TABLET BY MOUTH EVERY DAY BEFORE breakfast 09/22/21   [name], MD  GLYXAMBI 25-5 MG TABS TAKE 1 TABLET BY MOUTH DAILY 08/15/21   [name], MD  insulin glargine (LANTUS) 100 UNIT/ML injection Inject 0.15 mLs (15 Units total) into the skin daily. PLEASE INCREASE INSULIN BY 1 UNIT DAILY UNTIL FBG IS <130, THEN HOLD AT THAT DOSE 09/19/21   [name], MD  Lancets Misc. (ACCU-CHEK FASTCLIX LANCET) KIT Check BS QAM - DX: E11.9 02/12/19   [name], MD  losartan (COZAAR) 50 MG tablet TAKE 1 TABLET BY MOUTH DAILY 01/04/22   [name], MD  metFORMIN (GLUCOPHAGE) 1000 MG tablet TAKE 1 TABLET BY MOUTH TWICE DAILY WITH a meal 10/04/21   [name], MD  [facility] ULTRA test strip USE TO CHECK BLOOD SUGAR EVERY MORNING 05/26/21   [name], MD  oxyCODONE-acetaminophen (PERCOCET) 7.5-325 MG tablet Take 1 tablet by mouth every 4 (four) hours as needed for severe pain. 11/03/21   [name], MD      Allergies    Patient has no known allergies.    Review of Systems   Review of Systems  All other systems reviewed and are negative. Ten systems reviewed and  are negative for acute change, except as noted in the HPI.   Physical Exam Updated Vital Signs BP 111/62   Pulse 73   Temp 98 F (36.7 C) (Oral)   Resp 14   Ht _0  (1.854 m)   Wt (!) 156.7 kg   SpO2 97%   BMI 45.58 kg/m  Physical Exam Vitals and nursing note reviewed.  Constitutional:      General: He is not in acute distress.    Appearance: Normal appearance. He is not ill-appearing, toxic-appearing or diaphoretic.  HENT:     Head: Normocephalic and atraumatic.     Right Ear: External ear normal.     Left Ear: External ear normal.     Nose: Nose normal.     Mouth/Throat:     Mouth: Mucous membranes are moist.     Pharynx: Oropharynx is clear. No  oropharyngeal exudate or posterior oropharyngeal erythema.  Eyes:     General: No scleral icterus.       Right eye: No discharge.        Left eye: No discharge.     Extraocular Movements: Extraocular movements intact.     Conjunctiva/sclera: Conjunctivae normal.  Cardiovascular:     Rate and Rhythm: Normal rate. Rhythm irregular.     Pulses: Normal pulses.     Heart sounds: Normal heart sounds. No murmur heard.   No friction rub. No gallop.  Pulmonary:     Effort: Pulmonary effort is normal. No respiratory distress.     Breath sounds: No stridor. Rhonchi and rales present. No wheezing.  Abdominal:     General: Abdomen is flat. There is distension.     Palpations: Abdomen is soft.     Tenderness: There is no abdominal tenderness.     Comments: Severely distended abdomen.  Nontender in all 4 quadrants.  Musculoskeletal:        General: Swelling present. Normal range of motion.     Cervical back: Normal range of motion and neck supple. No tenderness.     Right lower leg: Edema present.     Left lower leg: Edema present.     Comments: 3+ pitting edema noted in the bilateral lower extremities.  Skin:    General: Skin is warm and dry.  Neurological:     General: No focal deficit present.     Mental Status: He is alert and oriented to person, place, and time.  Psychiatric:        Mood and Affect: Mood normal.        Behavior: Behavior normal.   ED Results / Procedures / Treatments   Labs (all labs ordered are listed, but only abnormal results are displayed) Labs Reviewed  BASIC METABOLIC PANEL - Abnormal; Notable for the following components:      Result Value   CO2 33 (*)    Glucose, Bld 165 (*)    BUN 25 (*)    Calcium 8.4 (*)    All other components within normal limits  CBC - Abnormal; Notable for the following components:   HCT 53.0 (*)    RDW 16.0 (*)    Platelets 135 (*)    All other components within normal limits  BRAIN NATRIURETIC PEPTIDE - Abnormal; Notable for  the following components:   B Natriuretic Peptide 113.0 (*)    All other components within normal limits  TROPONIN I (HIGH SENSITIVITY) - Abnormal; Notable for the following components:   Troponin I (High Sensitivity) 19 (*)  All other components within normal limits   EKG EKG Interpretation  Date/Time:  Sunday February 25 2022 20:35:29 EDT Ventricular Rate:  80 PR Interval:  133 QRS Duration: 145 QT Interval:  387 QTC Calculation: 447 R Axis:   70 Text Interpretation: Atrial fibrillation Right bundle branch block Non-specific ST-t changes Baseline wander Confirmed by Lajean Saver [phone]) on 02/25/2022 8:39:02 PM  Radiology DG Chest 2 View  Result Date: 02/25/2022 CLINICAL DATA:  [facility] EXAM: CHEST - 2 VIEW COMPARISON:  October 25, 2021, November 14, 2020 FINDINGS: The cardiomediastinal silhouette is unchanged and enlarged in contour. No pleural effusion. No pneumothorax. Perihilar vascular prominence, peribronchial cuffing and diffuse interstitial prominence. Possible nodular density in the region of the lingula seen on lateral imaging. Visualized abdomen is unremarkable. Mild degenerative changes of the thoracic spine. IMPRESSION: 1. Constellation of findings are most consistent with pulmonary edema. 2. Query nodular density of the lingula. This could reflect an area of atelectasis. Consider follow-up PA and lateral chest radiograph in 4 weeks after resolution of acute symptomatology versus dedicated cross-sectional imaging. Electronically Signed   By: Valentino Saxon M.D.   On: 02/25/2022 20:28    Procedures Procedures   Medications Ordered in ED Medications  furosemide (LASIX) injection 40 mg (40 mg Intravenous Given 02/25/22 2128)  ipratropium-albuterol (DUONEB) 0.5-2.5 (3) MG/3ML nebulizer solution 3 mL (3 mLs Nebulization Given 02/25/22 2132)   ED Course/ Medical Decision Making/ A&P Clinical Course as of 02/25/22 2141  Sun Feb 25, 2022  2106 B Natriuretic Peptide(!): 113.0 [LJ]   2123 Troponin I (High Sensitivity)(!): 19 [LJ]    Clinical Course User Index [LJ] Rayna Sexton, PA-C                           Medical Decision Making Amount and/or Complexity of Data Reviewed Labs: ordered. Decision-making details documented in ED Course. Radiology: ordered.  Risk Prescription drug management. Decision regarding hospitalization.   Pt is a 71 y.o. male with a complex medical history who presents to the emergency department due to shortness of breath, leg swelling, as well as abdominal distention.  History of CHF on Lasix.  States has been compliant with this medication.  Recently admitted at the [facility] earlier this week but left AGAINST MEDICAL ADVICE to come home.  Now presents with persistent shortness of breath and edema.  Labs: CBC with hematocrit of 53, RDW of 16, platelets of 135. BMP with a CO2 of 33, glucose of 165, BUN of 25, calcium of 8.4. BMP elevated at 113. Troponin of 19.  Imaging: Chest x-ray shows 1. Constellation of findings are most consistent with pulmonary edema. 2. Query nodular density of the lingula. This could reflect an area of atelectasis. Consider follow-up PA and lateral chest radiograph in 4 weeks after resolution of acute symptomatology versus dedicated cross-sectional imaging.   I, Rayna Sexton, PA-C, personally reviewed and evaluated these images and lab results as part of my medical decision-making.  On my exam patient has 3+ edema in the lower extremities.  Abdomen is distended.  Patient requiring 4 L via nasal cannula.  Oxygen saturations at 100%.  BNP mildly elevated at 113.  Troponin of 19.  Chest x-ray concerning for pulmonary edema.  ECG also shows atrial fibrillation.  Patient denies a history of atrial fibrillation in the past.  He is not anticoagulated.  Patient started on 40 mg of IV Lasix.  DuoNeb.  He will require admission for further  management.  This was discussed with the patient as well as his daughter at  bedside who are agreeable.  We will discuss with the medicine team at this time.  Note: Portions of this report may have been transcribed using voice recognition software. Every effort was made to ensure accuracy; however, inadvertent computerized transcription errors may be present.   Final Clinical Impression(s) / ED Diagnoses Final diagnoses:  Acute on chronic congestive heart failure, unspecified heart failure type [facility])  Atrial fibrillation, unspecified type [facility])   Rx / DC Orders ED Discharge Orders     None         Rayna Sexton, PA-C 02/25/22 2127    Rayna Sexton, PA-C 02/25/22 2141    Lajean Saver, MD 03/13/22 1103

## 2022-02-25 NOTE — H&P (Signed)
History and Physical    Patient: Steven Henry DEY:[id number] DOB: 14-Feb-1951 DOA: 02/25/2022 DOS: the patient was seen and examined on 02/26/2022 PCP: [name], MD  Patient coming from: Home  Chief Complaint:  Chief Complaint  Patient presents with   Congestive Heart Failure   HPI: Steven Henry is a 71 y.o. male with medical history significant of hypertension, T2DM, morbid obesity, COPD, OSA on CPAP, PVD, tobacco abuse who presents to the emergency department due to about 1 week onset of increasing leg swelling and about 5 days of worsening shortness of breath.  Most of the history was obtained from daughter at bedside.  Per daughter, patient was recently in [place] when he presented with worsening shortness of breath about 5 days ago, he presented to the hospital in [facility] where he was diuresed and transitorily treated with BiPAP, since patient's vacation was about to end, he decided to leave AMA and returned home ([place]) and then decided to go to the ED for further evaluation and management. Patient complained of increased leg swelling with weeping and increased abdominal girth.  He states that he has been compliant with home Lasix of 40 mg twice daily and he has been compliant with his CPAP and supplemental oxygen at 2 LPM which he only uses at night.  He denies chest pain, fever, headache, nausea, vomiting.  ED Course:  In the emergency department, he was initially tachypneic, but this eventually improved, other vital signs were within normal range, patient required supplemental oxygen at 5 L to maintain O2 sat of 93 to 98%.  Work-up in the ED showed normal CBC except for thrombocytopenia, BMP was normal except for hyperglycemia, bicarb 33, BUN 25, creatinine 1.1, troponin was flat at 19 and BNP was 213 Chest x-ray showed constellation of findings are most consistent with pulmonary edema. Patient was treated with IV Lasix 40 mg x 1, breathing treatment was  provided.  Hospitalist was asked to admit patient for further evaluation and management.  Review of Systems: Review of systems as noted in the HPI. All other systems reviewed and are negative.   Past Medical History:  Diagnosis Date   A-fib ([place])    CHF (congestive heart failure) (HCC)    CKD (chronic kidney disease), stage III (HCC)    Colon polyps    Diabetes mellitus type II, uncontrolled    Diabetes mellitus without complication ([place])    Hypertension    Morbid obesity ([place])    PVD (peripheral vascular disease) ([place])    right femoral artery   Sleep apnea    not on CPAP    Sleep apnea    Past Surgical History:  Procedure Laterality Date   COLONOSCOPY N/A 09/16/2015   Procedure: COLONOSCOPY;  Surgeon: Daneil Dolin, MD;  Location: [facility];  Service: Endoscopy;  Laterality: N/A;  8:15 AM - pt has c pap   ENDOVENOUS ABLATION SAPHENOUS VEIN W/ LASER Right 08/26/2017   endovenous laser ablation right greater saphenous vein by Tinnie Gens MD    stab phlebectomy  Right 01/07/2018   stab phlebectomy 10-20 incisions right leg by Tinnie Gens MD     Social History:  reports that he has been smoking cigarettes. He has a 15.00 pack-year smoking history. He has never used smokeless tobacco. He reports current alcohol use. He reports that he does not use drugs.   No Known Allergies  Family History  Problem Relation Age of Onset   Colon cancer Mother  Diabetes Mother    Heart disease Mother    Diabetes Sister    Diabetes Sister    Diabetes Sister       Prior to Admission medications   Medication Sig Start Date End Date Taking? Authorizing Provider  Accu-Chek FastClix Lancets MISC Check BS QAM - DX: E11.9 02/12/19   [name], MD  atorvastatin (LIPITOR) 40 MG tablet Take 1 tablet (40 mg total) by mouth daily. 09/19/21   [name], MD  Blood Glucose Monitoring Suppl (ONE TOUCH ULTRA 2) w/Device KIT USETO CHECK BLOOD SUGAR EVERY MORNING 05/26/21   [name], MD  Cholecalciferol (VITAMIN D3) 10 MCG (400 UNIT) tablet Take by mouth. 09/02/20   [provider]  furosemide (LASIX) 40 MG tablet TAKE 1 TABLET BY MOUTH TWICE DAILY 02/05/22   [name], MD  glipiZIDE (GLUCOTROL) 10 MG tablet TAKE 1 TABLET BY MOUTH EVERY DAY BEFORE breakfast 09/22/21   [name], MD  GLYXAMBI 25-5 MG TABS TAKE 1 TABLET BY MOUTH DAILY 08/15/21   [name], MD  insulin glargine (LANTUS) 100 UNIT/ML injection Inject 0.15 mLs (15 Units total) into the skin daily. PLEASE INCREASE INSULIN BY 1 UNIT DAILY UNTIL FBG IS <130, THEN HOLD AT THAT DOSE 09/19/21   [name], MD  Lancets Misc. (ACCU-CHEK FASTCLIX LANCET) KIT Check BS QAM - DX: E11.9 02/12/19   [name], MD  losartan (COZAAR) 50 MG tablet TAKE 1 TABLET BY MOUTH DAILY 01/04/22   [name], MD  metFORMIN (GLUCOPHAGE) 1000 MG tablet TAKE 1 TABLET BY MOUTH TWICE DAILY WITH a meal 10/04/21   [name], MD  [facility] ULTRA test strip USE TO CHECK BLOOD SUGAR EVERY MORNING 05/26/21   [name], MD  oxyCODONE-acetaminophen (PERCOCET) 7.5-325 MG tablet Take 1 tablet by mouth every 4 (four) hours as needed for severe pain. 11/03/21   [name], MD    Physical Exam: BP (!) 103/58 (BP Location: Right Arm)   Pulse 73   Temp 98.2 F (36.8 C) (Oral)   Resp 20   Ht '6\' 1"'  (1.854 m)   Wt (!) 156.7 kg   SpO2 96%   BMI 45.58 kg/m   General: 71 y.o. year-old morbidly obese male well developed ill appearing, but in no acute distress.  Alert and oriented x3. HEENT: NCAT, EOMI Neck: Supple, trachea medial Cardiovascular: Irregular rate and rhythm with no rubs or gallops.  No thyromegaly or JVD noted.  No lower extremity edema. 2/4 pulses in all 4 extremities. Respiratory: Bilateral Rales and wheezes on auscultation.   Abdomen: Soft, nontender but distended with normal bowel sounds x4 quadrants. Muskuloskeletal: +3 pitting edema to the knees bilaterally.   No cyanosis or clubbing noted bilaterally Neuro: CN II-XII intact, strength 5/5 x 4, sensation, reflexes intact  Skin: Bilateral leg swelling with erythema and blisters with no significant warmth to touch Psychiatry: Judgement and insight appear normal. Mood is appropriate for condition and setting            Labs on Admission:  Basic Metabolic Panel: Recent Labs  Lab 02/25/22 2028  NA 137  K 3.8  CL 99  CO2 33*  GLUCOSE 165*  BUN 25*  CREATININE 1.21  CALCIUM 8.4*   Liver Function Tests: No results for input(s): AST, ALT, ALKPHOS, BILITOT, PROT, ALBUMIN in the last 168 hours. No results for input(s): LIPASE, AMYLASE in the last 168 hours. No results for input(s):  AMMONIA in the last 168 hours. CBC: Recent Labs  Lab 02/25/22 2028  WBC 9.6  HGB 16.7  HCT 53.0*  MCV 99.8  PLT 135*   Cardiac Enzymes: No results for input(s): CKTOTAL, CKMB, CKMBINDEX, TROPONINI in the last 168 hours.  BNP (last 3 results) Recent Labs    02/25/22 2028  BNP 113.0*    ProBNP (last 3 results) No results for input(s): PROBNP in the last 8760 hours.  CBG: No results for input(s): GLUCAP in the last 168 hours.  Radiological Exams on Admission: DG Chest 2 View  Result Date: 02/25/2022 CLINICAL DATA:  ShOB EXAM: CHEST - 2 VIEW COMPARISON:  October 25, 2021, November 14, 2020 FINDINGS: The cardiomediastinal silhouette is unchanged and enlarged in contour. No pleural effusion. No pneumothorax. Perihilar vascular prominence, peribronchial cuffing and diffuse interstitial prominence. Possible nodular density in the region of the lingula seen on lateral imaging. Visualized abdomen is unremarkable. Mild degenerative changes of the thoracic spine. IMPRESSION: 1. Constellation of findings are most consistent with pulmonary edema. 2. Query nodular density of the lingula. This could reflect an area of atelectasis. Consider follow-up PA and lateral chest radiograph in 4 weeks after resolution of acute  symptomatology versus dedicated cross-sectional imaging. Electronically Signed   By: Valentino Saxon M.D.   On: 02/25/2022 20:28    EKG: I independently viewed the EKG done and my findings are as followed: A-fib with rate control, RBBB.  Assessment/Plan Present on Admission:  Acute on chronic diastolic CHF (congestive heart failure) (HCC)  Tobacco use  PVD (peripheral vascular disease) (HCC)  Morbid obesity (HCC)  Principal Problem:   Acute on chronic diastolic CHF (congestive heart failure) ([place]) Active Problems:   Morbid obesity ([place])   Obstructive sleep apnea   Essential hypertension   Tobacco use   PVD (peripheral vascular disease) (HCC)   Type 2 diabetes mellitus with hyperglycemia (HCC)   Mixed hyperlipidemia   Thrombocytopenia (HCC)  Acute on chronic diastolic CHF Echocardiogram done on 02/09/2013 showed EF of 50 - 55%.  G1 DD Chest x-ray showed constellation of findings are most consistent with pulmonary edema Continue total intake/output, daily weights and fluid restriction Continue IV Lasix 40 twice daily with holding parameters Continue Cardiac diet  Echocardiogram in the morning  Cardiology will be consulted  Elevated BNP in the setting of above BNP 113, continue treatment as described above  Chronic respiratory failure with hypoxia Continue home supplemental oxygen at night per home regimen  Elevated troponin possible secondary to type II demand ischemia Troponin x2 was flat at 19, patient denies any chest pain  Thrombocytopenia possibly reactive Platelets 135, continue to monitor platelet levels with morning labs  T2DM with hyperglycemia Continue insulin Semglee 10 units nightly and adjust dose accordingly Continue ISS and hypoglycemic protocol Glipizide and metformin will be held at this time  Paroxysmal A-fib with rate control Consider oral DOAC if patient continues to be in A-fib prior to discharge Consider rate control medication if patient  continues to be in atrial fibrillation prior to discharge  Essential hypertension BP meds will be held at this time due to soft BP  COPD Patient has no COPD meds on med rec, we will await updated med rec  OSA on CPAP Continue CPAP  Obesity Class 3 Continue diet and lifestyle modification  Tobacco abuse Patient counseled on tobacco abuse cessation Nicotine patch provided  PVD Patient provided with bilateral leg swelling with erythema in lower extremity Continue treatment as described for CHF Continue  to monitor patient and treat according  Mixed hyperlipidemia Continue Lipitor   DVT prophylaxis: Lovenox  Code Status: Full code  Consults: Cardiology  Family Communication: Daughter at bedside (all questions answered to satisfaction)  Severity of Illness: The appropriate patient status for this patient is INPATIENT. Inpatient status is judged to be reasonable and necessary in order to provide the required intensity of service to ensure the patient's safety. The patient's presenting symptoms, physical exam findings, and initial radiographic and laboratory data in the context of their chronic comorbidities is felt to place them at high risk for further clinical deterioration. Furthermore, it is not anticipated that the patient will be medically stable for discharge from the hospital within 2 midnights of admission.   * I certify that at the point of admission it is my clinical judgment that the patient will require inpatient hospital care spanning beyond 2 midnights from the point of admission due to high intensity of service, high risk for further deterioration and high frequency of surveillance required.*  Author: Bernadette Hoit, DO 02/26/2022 2:25 AM  For on call review [email or web address].

## 2022-02-25 NOTE — ED Notes (Signed)
Dr Adefeso at bedside. 

## 2022-02-25 NOTE — ED Triage Notes (Addendum)
Pt presents with CHF exacerbation. Pt has been having gradual worsening of ShOB, orthopnea, and swelling for the last couple weeks. Pt [place] the [place] after he ate some italian sausages. Per daughter pt has had a 25 lb weight gain. Pt was seen at [facility] and left AMA. SpO2 83% on R/A on arrival.

## 2022-02-26 ENCOUNTER — Inpatient Hospital Stay (HOSPITAL_COMMUNITY): Payer: Medicare Other

## 2022-02-26 DIAGNOSIS — J9621 Acute and chronic respiratory failure with hypoxia: Secondary | ICD-10-CM | POA: Diagnosis present

## 2022-02-26 DIAGNOSIS — E782 Mixed hyperlipidemia: Secondary | ICD-10-CM | POA: Diagnosis present

## 2022-02-26 DIAGNOSIS — I5033 Acute on chronic diastolic (congestive) heart failure: Secondary | ICD-10-CM

## 2022-02-26 DIAGNOSIS — J9622 Acute and chronic respiratory failure with hypercapnia: Secondary | ICD-10-CM

## 2022-02-26 DIAGNOSIS — E1165 Type 2 diabetes mellitus with hyperglycemia: Secondary | ICD-10-CM | POA: Diagnosis present

## 2022-02-26 DIAGNOSIS — J441 Chronic obstructive pulmonary disease with (acute) exacerbation: Secondary | ICD-10-CM

## 2022-02-26 DIAGNOSIS — I4891 Unspecified atrial fibrillation: Secondary | ICD-10-CM | POA: Diagnosis present

## 2022-02-26 DIAGNOSIS — I480 Paroxysmal atrial fibrillation: Secondary | ICD-10-CM | POA: Diagnosis not present

## 2022-02-26 DIAGNOSIS — E785 Hyperlipidemia, unspecified: Secondary | ICD-10-CM | POA: Diagnosis not present

## 2022-02-26 DIAGNOSIS — D696 Thrombocytopenia, unspecified: Secondary | ICD-10-CM

## 2022-02-26 LAB — CBC
HCT: 53.8 % — ABNORMAL HIGH (ref 39.0–52.0)
Hemoglobin: 17 g/dL (ref 13.0–17.0)
MCH: 31.3 pg (ref 26.0–34.0)
MCHC: 31.6 g/dL (ref 30.0–36.0)
MCV: 98.9 fL (ref 80.0–100.0)
Platelets: 129 10*3/uL — ABNORMAL LOW (ref 150–400)
RBC: 5.44 MIL/uL (ref 4.22–5.81)
RDW: 16 % — ABNORMAL HIGH (ref 11.5–15.5)
WBC: 8.2 10*3/uL (ref 4.0–10.5)
nRBC: 0 % (ref 0.0–0.2)

## 2022-02-26 LAB — COMPREHENSIVE METABOLIC PANEL
ALT: 25 U/L (ref 0–44)
AST: 36 U/L (ref 15–41)
Albumin: 3.4 g/dL — ABNORMAL LOW (ref 3.5–5.0)
Alkaline Phosphatase: 59 U/L (ref 38–126)
Anion gap: 8 (ref 5–15)
BUN: 22 mg/dL (ref 8–23)
CO2: 34 mmol/L — ABNORMAL HIGH (ref 22–32)
Calcium: 8.3 mg/dL — ABNORMAL LOW (ref 8.9–10.3)
Chloride: 97 mmol/L — ABNORMAL LOW (ref 98–111)
Creatinine, Ser: 0.96 mg/dL (ref 0.61–1.24)
GFR, Estimated: >60 mL/min (ref >60)
Glucose, Bld: 98 mg/dL (ref 70–99)
Potassium: 3.5 mmol/L (ref 3.5–5.1)
Sodium: 139 mmol/L (ref 135–145)
Total Bilirubin: 1.1 mg/dL (ref 0.3–1.2)
Total Protein: 6.6 g/dL (ref 6.5–8.1)

## 2022-02-26 LAB — ECHOCARDIOGRAM COMPLETE
AR max vel: 1.42 cm2
AV Area VTI: 1.45 cm2
AV Area mean vel: 1.38 cm2
AV Mean grad: 23.5 mmHg
AV Peak grad: 44.1 mmHg
Ao pk vel: 3.32 m/s
Area-P 1/2: 3.87 cm2
Height: 73 in
MV VTI: 3.85 cm2
S' Lateral: 3.8 cm
Weight: 5430.4 oz

## 2022-02-26 LAB — PHOSPHORUS: Phosphorus: 4.3 mg/dL (ref 2.5–4.6)

## 2022-02-26 LAB — GLUCOSE, CAPILLARY
Glucose-Capillary: 133 mg/dL — ABNORMAL HIGH (ref 70–99)
Glucose-Capillary: 135 mg/dL — ABNORMAL HIGH (ref 70–99)
Glucose-Capillary: 253 mg/dL — ABNORMAL HIGH (ref 70–99)
Glucose-Capillary: 214 mg/dL — ABNORMAL HIGH (ref 70–99)

## 2022-02-26 LAB — T4, FREE: Free T4: 1 ng/dL (ref 0.61–1.12)

## 2022-02-26 LAB — APTT: aPTT: 28 seconds (ref 24–36)

## 2022-02-26 LAB — TSH: TSH: 1.99 u[IU]/mL (ref 0.350–4.500)

## 2022-02-26 LAB — MAGNESIUM: Magnesium: 2.4 mg/dL (ref 1.7–2.4)

## 2022-02-26 LAB — FOLATE: Folate: 18.6 ng/mL (ref >5.9)

## 2022-02-26 LAB — VITAMIN B12: Vitamin B-12: 689 pg/mL (ref 180–914)

## 2022-02-26 LAB — HIV ANTIBODY (ROUTINE TESTING W REFLEX): HIV Screen 4th Generation wRfx: NONREACTIVE

## 2022-02-26 MED ORDER — ATORVASTATIN CALCIUM 40 MG PO TABS
40.0000 mg | ORAL_TABLET | Freq: Every day | ORAL | Status: DC
  Administered 2022-02-26 – 2022-03-01 (×4): 40 mg via ORAL
  Filled 2022-02-26 (×4): qty 1

## 2022-02-26 MED ORDER — ARFORMOTEROL TARTRATE 15 MCG/2ML IN NEBU
15.0000 ug | INHALATION_SOLUTION | Freq: Two times a day (BID) | RESPIRATORY_TRACT | Status: DC
Start: 2022-02-26 — End: 2022-03-01
  Administered 2022-02-26 – 2022-03-01 (×7): 15 ug via RESPIRATORY_TRACT
  Filled 2022-02-26 (×6): qty 2

## 2022-02-26 MED ORDER — BUDESONIDE 0.5 MG/2ML IN SUSP
0.5000 mg | Freq: Two times a day (BID) | RESPIRATORY_TRACT | Status: DC
  Administered 2022-02-26 – 2022-03-01 (×7): 0.5 mg via RESPIRATORY_TRACT
  Filled 2022-02-26 (×6): qty 2

## 2022-02-26 MED ORDER — PERFLUTREN LIPID MICROSPHERE
1.0000 mL | INTRAVENOUS | Status: AC | PRN
  Administered 2022-02-26: 6 mL via INTRAVENOUS

## 2022-02-26 MED ORDER — IPRATROPIUM-ALBUTEROL 0.5-2.5 (3) MG/3ML IN SOLN
3.0000 mL | RESPIRATORY_TRACT | Status: DC | PRN
Start: 2022-02-26 — End: 2022-03-01

## 2022-02-26 MED ORDER — INSULIN ASPART 100 UNIT/ML IJ SOLN
0.0000 [IU] | Freq: Three times a day (TID) | INTRAMUSCULAR | Status: DC
  Administered 2022-02-26: 8 [IU] via SUBCUTANEOUS
  Administered 2022-02-26 (×2): 2 [IU] via SUBCUTANEOUS
  Administered 2022-02-27: 3 [IU] via SUBCUTANEOUS
  Administered 2022-02-27 (×2): 8 [IU] via SUBCUTANEOUS
  Administered 2022-02-28: 5 [IU] via SUBCUTANEOUS
  Administered 2022-02-28 (×2): 8 [IU] via SUBCUTANEOUS
  Administered 2022-03-01: 5 [IU] via SUBCUTANEOUS
  Administered 2022-03-01: 11 [IU] via SUBCUTANEOUS

## 2022-02-26 MED ORDER — ENOXAPARIN SODIUM 80 MG/0.8ML IJ SOSY
75.0000 mg | PREFILLED_SYRINGE | INTRAMUSCULAR | Status: DC
  Administered 2022-02-26: 75 mg via SUBCUTANEOUS
  Filled 2022-02-26: qty 0.8

## 2022-02-26 MED ORDER — INSULIN GLARGINE-YFGN 100 UNIT/ML ~~LOC~~ SOLN
10.0000 [IU] | Freq: Every day | SUBCUTANEOUS | Status: DC
  Administered 2022-02-26: 10 [IU] via SUBCUTANEOUS
  Filled 2022-02-26 (×2): qty 0.1

## 2022-02-26 MED ORDER — METHYLPREDNISOLONE SODIUM SUCC 125 MG IJ SOLR
INTRAMUSCULAR | Status: AC
  Filled 2022-02-26: qty 2

## 2022-02-26 MED ORDER — IPRATROPIUM-ALBUTEROL 0.5-2.5 (3) MG/3ML IN SOLN
RESPIRATORY_TRACT | Status: AC
  Filled 2022-02-26: qty 3

## 2022-02-26 MED ORDER — FUROSEMIDE 10 MG/ML IJ SOLN
40.0000 mg | Freq: Two times a day (BID) | INTRAMUSCULAR | Status: DC
  Filled 2022-02-26: qty 4

## 2022-02-26 MED ORDER — FUROSEMIDE 10 MG/ML IJ SOLN
80.0000 mg | Freq: Two times a day (BID) | INTRAMUSCULAR | Status: DC
  Administered 2022-02-26 – 2022-03-01 (×7): 80 mg via INTRAVENOUS
  Filled 2022-02-26 (×8): qty 8

## 2022-02-26 MED ORDER — APIXABAN 5 MG PO TABS
5.0000 mg | ORAL_TABLET | Freq: Two times a day (BID) | ORAL | Status: DC
  Administered 2022-02-26 – 2022-03-01 (×7): 5 mg via ORAL
  Filled 2022-02-26 (×7): qty 1

## 2022-02-26 MED ORDER — NICOTINE 21 MG/24HR TD PT24
21.0000 mg | MEDICATED_PATCH | Freq: Every day | TRANSDERMAL | Status: DC
  Administered 2022-02-26 – 2022-02-27 (×2): 21 mg via TRANSDERMAL
  Filled 2022-02-26 (×4): qty 1

## 2022-02-26 MED ORDER — BUDESONIDE 0.5 MG/2ML IN SUSP
RESPIRATORY_TRACT | Status: AC
  Filled 2022-02-26: qty 2

## 2022-02-26 MED ORDER — METHYLPREDNISOLONE SODIUM SUCC 125 MG IJ SOLR
60.0000 mg | Freq: Two times a day (BID) | INTRAMUSCULAR | Status: DC
  Administered 2022-02-26 – 2022-02-27 (×4): 60 mg via INTRAVENOUS
  Filled 2022-02-26 (×4): qty 2

## 2022-02-26 MED ORDER — IPRATROPIUM-ALBUTEROL 0.5-2.5 (3) MG/3ML IN SOLN
3.0000 mL | Freq: Four times a day (QID) | RESPIRATORY_TRACT | Status: DC
  Administered 2022-02-26 (×3): 3 mL via RESPIRATORY_TRACT
  Filled 2022-02-26 (×2): qty 3

## 2022-02-26 MED ORDER — ARFORMOTEROL TARTRATE 15 MCG/2ML IN NEBU
INHALATION_SOLUTION | RESPIRATORY_TRACT | Status: AC
  Filled 2022-02-26: qty 2

## 2022-02-26 MED ORDER — INSULIN ASPART 100 UNIT/ML IJ SOLN
0.0000 [IU] | Freq: Every day | INTRAMUSCULAR | Status: DC
  Administered 2022-02-26: 2 [IU] via SUBCUTANEOUS
  Administered 2022-02-27 – 2022-02-28 (×2): 5 [IU] via SUBCUTANEOUS

## 2022-02-26 MED ORDER — IPRATROPIUM-ALBUTEROL 0.5-2.5 (3) MG/3ML IN SOLN
3.0000 mL | Freq: Three times a day (TID) | RESPIRATORY_TRACT | Status: DC
  Administered 2022-02-27 – 2022-03-01 (×8): 3 mL via RESPIRATORY_TRACT
  Filled 2022-02-26 (×5): qty 3
  Filled 2022-02-26: qty 6
  Filled 2022-02-26: qty 3

## 2022-02-26 NOTE — Consult Note (Addendum)
Cardiology Consultation:   Patient ID: Steven Henry MRN: [id number]; DOB: 26-Aug-1951  Admit date: 02/25/2022 Date of Consult: 02/26/2022  PCP:  Steven Frizzle, MD   [facility] Providers Cardiologist: New  Patient Profile:   Steven Henry is a 71 y.o. male with a hx of HTN, DM2, morbid obesity, COPD, OSA on CPAP, PVD with stenting in 2019, tobacco abuse who is being seen 02/26/2022 for the evaluation of Acute CHF at the request of Dr. Carles Collet.  History of Present Illness:   Steven Henry has no significant cardiac history.H/o PVD with prior stenting. He has smoked most of his adult life, recently planning on quitting. No alcohol use drug use. Family history positive for PPM in mother and maternal grandmother with MI.   Echo in 2014 showed LVEF 50-55%, mild LVH, G1DD, moderately dilated LA, trace MR.   The patient presented to the ER 02/26/22 for shortness of breath. [place] and noted worsening breathing. He went to [place] and was diuresed and treated with Bipap. He was only there 1 night before leaving AMA and returned home and decided to go to the ED. He notes worsening LLE and breathing a week ago. He denies known history of Afib. Denied chest pain. He was on lasix 56m BID PTA.   In the ER BP 111/62, pulse 73, afebrile, RR 14, 97% O2. Edema noted on exam. Labs showed CO2 33, BG 165, plt 135, BNP 113, SCR 1.21, BUN 25. HS trop 16>19. EKG showed possible new onset Afib with controlled rates. HE was given IV lasix and admitted.    Past Medical History:  Diagnosis Date   A-fib ([facility]    CHF (congestive heart failure) (HCC)    CKD (chronic kidney disease), stage III (HCC)    Colon polyps    Diabetes mellitus type II, uncontrolled    Diabetes mellitus without complication ([place]    Hypertension    Morbid obesity ([facility]    PVD (peripheral vascular disease) ([facility]    right femoral artery   Sleep apnea    not on CPAP     Sleep apnea     Past Surgical History:  Procedure Laterality Date   COLONOSCOPY N/A 09/16/2015   Procedure: COLONOSCOPY;  Surgeon: RDaneil Dolin MD;  Location: [facility];  Service: Endoscopy;  Laterality: N/A;  8:15 AM - pt has c pap   ENDOVENOUS ABLATION SAPHENOUS VEIN W/ LASER Right 08/26/2017   endovenous laser ablation right greater saphenous vein by JTinnie GensMD    stab phlebectomy  Right 01/07/2018   stab phlebectomy 10-20 incisions right leg by JTinnie GensMD      Home Medications:  Prior to Admission medications   Medication Sig Start Date End Date Taking? Authorizing Provider  Accu-Chek FastClix Lancets MISC Check BS QAM - DX: E11.9 02/12/19   PSusy Frizzle MD  atorvastatin (LIPITOR) 40 MG tablet Take 1 tablet (40 mg total) by mouth daily. 09/19/21   PSusy Frizzle MD  Blood Glucose Monitoring Suppl (ONE TOUCH ULTRA 2) w/Device KIT USETO CHECK BLOOD SUGAR EVERY MORNING 05/26/21   PSusy Frizzle MD  Cholecalciferol (VITAMIN D3) 10 MCG (400 UNIT) tablet Take by mouth. 09/02/20   [provider]  furosemide (LASIX) 40 MG tablet TAKE 1 TABLET BY MOUTH TWICE DAILY 02/05/22   PSusy Frizzle MD  glipiZIDE (GLUCOTROL) 10 MG tablet TAKE 1 TABLET BY MOUTH EVERY DAY  BEFORE breakfast 09/22/21   Steven Frizzle, MD  GLYXAMBI 25-5 MG TABS TAKE 1 TABLET BY MOUTH DAILY 08/15/21   Steven Frizzle, MD  insulin glargine (LANTUS) 100 UNIT/ML injection Inject 0.15 mLs (15 Units total) into the skin daily. PLEASE INCREASE INSULIN BY 1 UNIT DAILY UNTIL FBG IS <130, THEN HOLD AT THAT DOSE 09/19/21   Steven Frizzle, MD  Lancets Misc. (ACCU-CHEK FASTCLIX LANCET) KIT Check BS QAM - DX: E11.9 02/12/19   Steven Frizzle, MD  losartan (COZAAR) 50 MG tablet TAKE 1 TABLET BY MOUTH DAILY 01/04/22   Steven Frizzle, MD  metFORMIN (GLUCOPHAGE) 1000 MG tablet TAKE 1 TABLET BY MOUTH TWICE DAILY WITH a meal 10/04/21   Steven Frizzle, MD  [facility] ULTRA test strip USE TO CHECK  BLOOD SUGAR EVERY MORNING 05/26/21   Steven Frizzle, MD  oxyCODONE-acetaminophen (PERCOCET) 7.5-325 MG tablet Take 1 tablet by mouth every 4 (four) hours as needed for severe pain. 11/03/21   Steven Frizzle, MD    Inpatient Medications: Scheduled Meds:  atorvastatin  40 mg Oral Daily   enoxaparin (LOVENOX) injection  60 mg Subcutaneous Q24H   furosemide  40 mg Intravenous Q12H   insulin aspart  0-15 Units Subcutaneous TID WC   insulin aspart  0-5 Units Subcutaneous QHS   insulin glargine-yfgn  10 Units Subcutaneous QHS   nicotine  21 mg Transdermal Daily   Continuous Infusions:  PRN Meds: acetaminophen **OR** acetaminophen, ondansetron **OR** ondansetron (ZOFRAN) IV  Allergies:   No Known Allergies  Social History:   Social History   Socioeconomic History   Marital status: Divorced    Spouse name: Not on file   Number of children: 2   Years of education: Not on file   Highest education level: Not on file  Occupational History   Occupation: Self Employed  Tobacco Use   Smoking status: Every Day    Packs/day: 0.50    Years: 30.00    Pack years: 15.00    Types: Cigarettes   Smokeless tobacco: Never  Vaping Use   Vaping Use: Never used  Substance and Sexual Activity   Alcohol use: Yes    Comment: occasional   Drug use: No   Sexual activity: Not on file    Comment: divorced, 2 daughters.  works Psychologist, occupational for fairs, Social research officer, government.  Other Topics Concern   Not on file  Social History Narrative   Not on file   Social Determinants of Health   Financial Resource Strain: Not on file  Food Insecurity: Not on file  Transportation Needs: Not on file  Physical Activity: Not on file  Stress: Not on file  Social Connections: Not on file  Intimate Partner Violence: Not on file    Family History:    Family History  Problem Relation Age of Onset   Colon cancer Mother    Diabetes Mother    Heart disease Mother    Diabetes Sister    Diabetes Sister    Diabetes  Sister      ROS:  Please see the history of present illness.   All other ROS reviewed and negative.     Physical Exam/Data:   Vitals:   02/25/22 2306 02/25/22 2344 02/26/22 0407 02/26/22 0633  BP: (!) 103/58  125/80   Pulse: 64 73 70   Resp: '20 20 20   ' Temp: 98.2 F (36.8 C)  98.2 F (36.8 C)   TempSrc: Oral  SpO2: 93% 96% 98%   Weight:    (!) 154 kg  Height:        Intake/Output Summary (Last 24 hours) at 02/26/2022 0751 Last data filed at 02/26/2022 2800 Gross per 24 hour  Intake --  Output 1800 ml  Net -1800 ml      02/26/2022    6:33 AM 02/25/2022    7:55 PM 01/12/2022    3:11 PM  Last 3 Weights  Weight (lbs) 339 lb 6.4 oz 345 lb 7.4 oz 320 lb 9.6 oz  Weight (kg) 153.951 kg 156.7 kg 145.423 kg     Body mass index is 44.78 kg/m.  General:  Well nourished, well developed, in no acute distress HEENT: normal Neck: minimal JVD Vascular: No carotid bruits; Distal pulses 2+ bilaterally Cardiac:  normal S1, S2; RRR; no murmur  Lungs:  diffuse wheezing Abd: soft, nontender, no hepatomegaly  Ext: 3+ b/l lower leg edema Musculoskeletal:  No deformities, BUE and BLE strength normal and equal Skin: warm and dry  Neuro:  CNs 2-12 intact, no focal abnormalities noted Psych:  Normal affect   EKG:  The EKG was personally reviewed and demonstrates:  Afib, 69bpm, RBBB, nonspecific T wave changes Telemetry:  Telemetry was personally reviewed and demonstrates:  Afib, HR upper 40s at times, mostly 60-70s.   Relevant CV Studies:  Echo ordered  Echo 2014 LVEF 50-55%, mild LVH, G1DD, moderately dilated LA, trace MR.   Laboratory Data:  High Sensitivity Troponin:   Recent Labs  Lab 02/25/22 2028 02/25/22 2222  TROPONINIHS 19* 19*     Chemistry Recent Labs  Lab 02/25/22 2028 02/26/22 0433  NA 137 139  K 3.8 3.5  CL 99 97*  CO2 33* 34*  GLUCOSE 165* 98  BUN 25* 22  CREATININE 1.21 0.96  CALCIUM 8.4* 8.3*  MG  --  2.4  GFRNONAA >60 >60  ANIONGAP 5 8     Recent Labs  Lab 02/26/22 0433  PROT 6.6  ALBUMIN 3.4*  AST 36  ALT 25  ALKPHOS 59  BILITOT 1.1   Lipids No results for input(s): CHOL, TRIG, HDL, LABVLDL, LDLCALC, CHOLHDL in the last 168 hours.  Hematology Recent Labs  Lab 02/25/22 2028 02/26/22 0433  WBC 9.6 8.2  RBC 5.31 5.44  HGB 16.7 17.0  HCT 53.0* 53.8*  MCV 99.8 98.9  MCH 31.5 31.3  MCHC 31.5 31.6  RDW 16.0* 16.0*  PLT 135* 129*   Thyroid No results for input(s): TSH, FREET4 in the last 168 hours.  BNP Recent Labs  Lab 02/25/22 2028  BNP 113.0*    DDimer No results for input(s): DDIMER in the last 168 hours.   Radiology/Studies:  DG Chest 2 View  Result Date: 02/25/2022 CLINICAL DATA:  ShOB EXAM: CHEST - 2 VIEW COMPARISON:  October 25, 2021, November 14, 2020 FINDINGS: The cardiomediastinal silhouette is unchanged and enlarged in contour. No pleural effusion. No pneumothorax. Perihilar vascular prominence, peribronchial cuffing and diffuse interstitial prominence. Possible nodular density in the region of the lingula seen on lateral imaging. Visualized abdomen is unremarkable. Mild degenerative changes of the thoracic spine. IMPRESSION: 1. Constellation of findings are most consistent with pulmonary edema. 2. Query nodular density of the lingula. This could reflect an area of atelectasis. Consider follow-up PA and lateral chest radiograph in 4 weeks after resolution of acute symptomatology versus dedicated cross-sectional imaging. Electronically Signed   By: Valentino Saxon M.D.   On: 02/25/2022 20:28     Assessment and  Plan:   Acute on chronic diastolic CHF - CXR with pulmonary edema BNP 113, suspect this is low due to obesity - He is on 4LO2, does not use O2 at home - Echo from 2014 showed LVEF 50-55%, G1DD - repeat echo ordered - IV lasix 4m BID - PTA lasix 469mBID - strict I/Os -1.8L - kidney function stable - PTA losartan held for soft BP.  - On exam he is severely volume overloaded, new afib  may be contributing. He may tolerate higher IV lasix dose, however BP soft at times. Continue with diuresis.  New Afib - EKG shows Afib with well controlled rates - unclear chronicity of afib - CHADSVASC at least 5 (age, HTN, DM2, PAD, CHF) - He is on Lovenox injections, will require long term anticoagulation, will switch to Eliquis - may not need BB given he is well rate controlled - would consider TEE/DCCV during admission, however may tolerate 3 weeks of uninterrupted a/c followed by DCCV - TSH pending - keep K>4 and Mag>2  Acute on Chronic respiratory failure  COPD/Tobacco use OSA on CPAP - multifactorial given CHF, COPD, tobacco use, OSA, OHS, afib, general deconditioning  - He uses CPAP at night, but has not had sleep study in 5-6 years - wheezing on exam - IV lasix for CHF - currently on 4L O2> wean as able  Elevated troponin - HS trop 19>19 - flat trend and not consistent with ACS, suspect demand ischemia - can consider outpatient ischemic work-up, has multiple RF for CAD  HTN - intermittently soft at times - IV lasix - PTA Losartan 5047maily>restart when able - consider addition of BB  HLD - LDL 119 08/2021 - continue Lipitor 110m26mily, consider increase to 80mg73mly  DM2 - per IM  For questions or updates, please contact [facility] consult [email or web address] for contact info under    Signed, Cadence H FurNinfa MeekerC  02/26/2022 7:51 AM   Personally seen and examined. Agree with above.  70 ye34 old with newly discovered atrial fibrillation in the setting of morbid obesity obstructive sleep apnea chronic COPD tobacco use with chronic diastolic heart failure.  Fairly comfortable in bed.  Has notable lower extremity edema.  Troponin 19 flat  Assessment and plan:  Paroxysmal atrial fibrillation newly diagnosed - Rate controlled without any AV nodal blocking agents.  CHA2DS2-VASc 5 (hypertension diabetes PAD CHF age) -We will switch to Eliquis 5 mg  twice a day. -There is no urgency for cardioversion.  I think that it is wise to give him the opportunity to reestablish normal sinus rhythm.  In 3 weeks of uninterrupted Eliquis, he may proceed with a cardioversion.  Given his multitude of risk factors, there is a chance that this will not be successful long-term. -Discussed with him the importance of Eliquis for stroke prevention.  Acute on chronic diastolic heart failure - Agree with increasing Lasix to 80 mg IV twice daily.  Told him that it may be a few days before adequate diuresis takes place.  Diabetes hypertension hyperlipidemia - Continue with atorvastatin, prior LDL 119, would advocate for less than 70  Obstructive sleep apnea - Utilizes CPAP at night.  Troponin of 19 - Mildly elevated flat, myocardial injury in the setting of A-fib/diastolic heart failure.  We will continue to follow.  Tyresha Fede Candee Furbish

## 2022-02-26 NOTE — Assessment & Plan Note (Addendum)
Noncompliant with Lantus at home - advised to start using it again [facility] on hold in hospital but resume at DC  Holding metformin and glipizide in hospital - resume at DC  novolog sliding scale -09/15/21 A1C--10.1 -increase semglee to 24 units, added prandial coverage 8 units TIDAC.  CBG (last 3)  Recent Labs    03/01/22 0256 03/01/22 0730 03/01/22 1114  GLUCAP 264* 230* 319*   Resume home treatment at discharge

## 2022-02-26 NOTE — Assessment & Plan Note (Signed)
Continue statin. 

## 2022-02-26 NOTE — TOC Progression Note (Signed)
Transition of Care [facility]) - Progression Note    Patient Details  Name: Rc Amison MRN: [id number] Date of Birth: 1951-02-13  Transition of Care [facility]) CM/SW Contact  Salome Arnt, [place] Phone Number: 02/26/2022, 9:49 AM  Clinical Narrative:  Transition of Care [facility]) Screening Note   Patient Details  Name: Marina Desire Date of Birth: 04-Nov-1950   Transition of Care [facility]) CM/SW Contact:    Salome Arnt, [place] Phone Number: 02/26/2022, 9:49 AM    Transition of Care Department [facility]) has reviewed patient and no TOC needs have been identified at this time. We will continue to monitor patient advancement through interdisciplinary progression rounds. If new patient transition needs arise, please place a TOC consult.             Expected Discharge Plan and Services                                                 Social Determinants of Health (SDOH) Interventions    Readmission Risk Interventions     View : No data to display.

## 2022-02-26 NOTE — Assessment & Plan Note (Addendum)
Continue pulmicort and brovana Continue duonebs He was treated with IV solumedrol and he is improving well and can discharge home today.

## 2022-02-26 NOTE — Assessment & Plan Note (Addendum)
Type unspecified Rate controlled presently TSH--1.990 EchoEF 65-70, no WMA CHADSVASC2 = 5 -apixaban started

## 2022-02-26 NOTE — Assessment & Plan Note (Signed)
Likely due to chronic hepatic congestion -hep B surface antigen -hep C antibody -B12--689 -folate--18.6 -TSH--1.990

## 2022-02-26 NOTE — Assessment & Plan Note (Addendum)
Continue to offer CPAP

## 2022-02-26 NOTE — Progress Notes (Signed)
*  PRELIMINARY RESULTS* Echocardiogram 2D Echocardiogram has been performed.  Steven Henry 02/26/2022, 3:11 PM

## 2022-02-26 NOTE — Assessment & Plan Note (Addendum)
Chronically on 2L at hs Now on 4L, weaning down to baseline of 3L Due to COPD exacerbation and pulm edema Wean for saturation >90%

## 2022-02-26 NOTE — Assessment & Plan Note (Signed)
Tobacco cessation discussed 

## 2022-02-26 NOTE — Progress Notes (Addendum)
PROGRESS NOTE  Steven Henry VFI:[id number] DOB: November 28, 1950 DOA: 02/25/2022 PCP: [name], MD  Brief History:  71 year old male with a history of COPD, tobacco abuse, OSA on CPAP, hypertension, diabetes mellitus type 2, and obesity presenting with 1 week history of shortness of breath and lower extremity edema.  The patient was already having the above symptoms when he and his family went on a [place].  During her surgery, the patient developed worsening shortness of breath and edema.  He went [place] where he was noted to have new onset atrial fibrillation with decompensated CHF.  Initial ABG at that time showed 7.3 7/62/52/35 on room air.  He was placed on BiPAP and diuresed.  He was subsequently weaned to 3 L nasal cannula.  He left AGAINST MEDICAL ADVICE to come back home to [facility]. The patient endorses lower extremity edema for some time with significant worsening over the past week.  He continues to smoke 1-1/2 packs/day.  He also complains of orthopnea type symptoms and increasing abdominal girth.  He denies any fevers, chills, headache, chest pain, abdominal pain, nausea, vomiting, diarrhea, hematochezia, melena. In the ED, the patient was afebrile hemodynamically stable with oxygen saturation 95-100% on 4 L.  WBC 9.6, hemoglobin 16.7, platelets 135,000.  Sodium 137, potassium 3.8, serum creatinine 1.21.     Assessment and Plan: * Acute on chronic diastolic CHF (congestive heart failure) (HCC) Echo ReDS reading today Remains clinically fluid overloaded Increase lasix to 80 mg bid IV Daily weights Accurate I/Os  Atrial fibrillation (HCC) Type unspecified Rate controlled presently TSH Echo CHADSVASC2 = 5  Acute on chronic respiratory failure with hypoxia and hypercapnia (HCC) Chronically on 2L at hs Now on 4L Due to COPD exacerbation and pulm edema Wean for saturation  >90%  Thrombocytopenia (HCC) Likely due to chronic hepatic congestion -hep B surface antigen -hep C antibody -B12 -folate -TSH  Mixed hyperlipidemia Continue statin  Type 2 diabetes mellitus with hyperglycemia (HCC) Noncompliant with Lantus at home Glyxambi on hold Holding metformin and glipizide Start novolog sliding scale Check A1C -09/15/21 A1C--10.1  Tobacco use Tobacco cessation discussed  Essential hypertension Holding losartan to allow BP margin for diuresis  COPD with acute exacerbation ([place]) Start pulmicort Start duonebs Start IV solumedrol  Obstructive sleep apnea Continue nighttime CPAP  Morbid obesity ([place]) BMI 44.78 Lifestyle modification     Family Communication:   daughter updated at bedside 6/5  Consultants:  cardio  Code Status:  FULL  DVT Prophylaxis:  apixaban   Procedures: As Listed in Progress Note Above  Antibiotics: None     Subjective: He complains of sob and dysnea on exertion with leg edema.  Patient denies fevers, chills, headache, chest pain,nausea, vomiting, diarrhea, abdominal pain, dysuria, hematuria, hematochezia, and melena.   Objective: Vitals:   02/26/22 0633 02/26/22 0803 02/26/22 0832 02/26/22 1052  BP:  93/76    Pulse:  61    Resp:  13    Temp:  97.7 F (36.5 C)    TempSrc:  Oral    SpO2:  (!) 80% 95% 91%  Weight: (!) 154 kg     Height:        Intake/Output Summary (Last 24 hours) at 02/26/2022 1147 Last data filed at 02/26/2022 1013 Gross per 24 hour  Intake 480 ml  Output 2550 ml  Net -2070 ml   Weight  change:  Exam:  General:  Pt is alert, follows commands appropriately, not in acute distress HEENT: No icterus, No thrush, No neck mass, [place]/AT Cardiovascular: IRRR, S1/S2, no rubs, no gallops Respiratory: bilateral rales.  Bilateral wheeze Abdomen: Soft/+BS, non tender, non distended, no guarding Extremities: 2 +LE edema, No lymphangitis, No petechiae, No rashes, no synovitis   Data  Reviewed: I have personally reviewed following labs and imaging studies Basic Metabolic Panel: Recent Labs  Lab 02/25/22 2028 02/26/22 0433  NA 137 139  K 3.8 3.5  CL 99 97*  CO2 33* 34*  GLUCOSE 165* 98  BUN 25* 22  CREATININE 1.21 0.96  CALCIUM 8.4* 8.3*  MG  --  2.4  PHOS  --  4.3   Liver Function Tests: Recent Labs  Lab 02/26/22 0433  AST 36  ALT 25  ALKPHOS 59  BILITOT 1.1  PROT 6.6  ALBUMIN 3.4*   No results for input(s): LIPASE, AMYLASE in the last 168 hours. No results for input(s): AMMONIA in the last 168 hours. Coagulation Profile: No results for input(s): INR, PROTIME in the last 168 hours. CBC: Recent Labs  Lab 02/25/22 2028 02/26/22 0433  WBC 9.6 8.2  HGB 16.7 17.0  HCT 53.0* 53.8*  MCV 99.8 98.9  PLT 135* 129*   Cardiac Enzymes: No results for input(s): CKTOTAL, CKMB, CKMBINDEX, TROPONINI in the last 168 hours. BNP: Invalid input(s): POCBNP CBG: Recent Labs  Lab 02/26/22 0709 02/26/22 1105  GLUCAP 133* 135*   HbA1C: No results for input(s): HGBA1C in the last 72 hours. Urine analysis:    Component Value Date/Time   COLORURINE YELLOW 06/14/2013 [place] 06/14/2013 1545   LABSPEC 1.015 06/14/2013 1545   PHURINE 7.0 06/14/2013 1545   GLUCOSEU NEGATIVE 06/14/2013 1545   HGBUR NEGATIVE 06/14/2013 1545   BILIRUBINUR NEGATIVE 06/14/2013 1545   KETONESUR NEGATIVE 06/14/2013 1545   PROTEINUR TRACE (A) 06/14/2013 1545   UROBILINOGEN 0.2 06/14/2013 1545   NITRITE NEGATIVE 06/14/2013 1545   LEUKOCYTESUR NEGATIVE 06/14/2013 1545   Sepsis Labs: '@LABRCNTIP'$ (procalcitonin:4,lacticidven:4) )No results found for this or any previous visit (from the past 240 hour(s)).   Scheduled Meds:  arformoterol  15 mcg Nebulization BID   atorvastatin  40 mg Oral Daily   budesonide (PULMICORT) nebulizer solution  0.5 mg Nebulization BID   enoxaparin (LOVENOX) injection  75 mg Subcutaneous Q24H   furosemide  80 mg Intravenous BID    insulin aspart  0-15 Units Subcutaneous TID WC   insulin aspart  0-5 Units Subcutaneous QHS   insulin glargine-yfgn  10 Units Subcutaneous QHS   ipratropium-albuterol  3 mL Nebulization Q6H   methylPREDNISolone (SOLU-MEDROL) injection  60 mg Intravenous Q12H   nicotine  21 mg Transdermal Daily   Continuous Infusions:  Procedures/Studies: DG Chest 2 View  Result Date: 02/25/2022 CLINICAL DATA:  ShOB EXAM: CHEST - 2 VIEW COMPARISON:  October 25, 2021, November 14, 2020 FINDINGS: The cardiomediastinal silhouette is unchanged and enlarged in contour. No pleural effusion. No pneumothorax. Perihilar vascular prominence, peribronchial cuffing and diffuse interstitial prominence. Possible nodular density in the region of the lingula seen on lateral imaging. Visualized abdomen is unremarkable. Mild degenerative changes of the thoracic spine. IMPRESSION: 1. Constellation of findings are most consistent with pulmonary edema. 2. Query nodular density of the lingula. This could reflect an area of atelectasis. Consider follow-up PA and lateral chest radiograph in 4 weeks after resolution of acute symptomatology versus dedicated cross-sectional imaging. Electronically Signed   By: Colletta [place]  Peacock M.D.   On: 02/25/2022 20:28    Orson Eva, DO  [facility]  If 7PM-7AM, please contact night-coverage [email or web address] Password [email or web address] 02/26/2022, 11:47 AM   LOS: 1 day

## 2022-02-26 NOTE — Assessment & Plan Note (Addendum)
Holding losartan to allow BP margin for diuresis Can revisit starting back on outpatient basis per cardiology

## 2022-02-26 NOTE — Assessment & Plan Note (Addendum)
Echo EF 65-70%, no WMA, mod elevated PASP ReDS reading 6/6 = 41 Remains clinically fluid overloaded Continue lasix to 80 mg bid IV Daily weights Accurate I/Os-NEG  Appreciate cardiology  I/O last 3 completed shifts: In: 2100 [P.O.:2100] Out: 5573 [Urine:6650] Total I/O In: 240 [P.O.:240] Out: -     Intake/Output Summary (Last 24 hours) at 03/01/2022 1258 Last data filed at 03/01/2022 0900 Gross per 24 hour  Intake 1140 ml  Output 3000 ml  Net -1860 ml   Filed Weights   02/27/22 0514 02/28/22 0511 03/01/22 0500  Weight: (!) 151 kg (!) 151 kg (!) 149.1 kg    ReDs Vest: 36% 03/01/2022  Discussed with Dr. Domenic Polite, Can DC home today on lasix 60 mg BID with KCl 10 meq daily and outpatient follow up in 4-6 weeks.

## 2022-02-26 NOTE — Progress Notes (Signed)
1428 Patient having ECHO done, RT will return at a later time to administer nebulizer.

## 2022-02-26 NOTE — Assessment & Plan Note (Signed)
BMI 44.78 Lifestyle modification

## 2022-02-26 NOTE — Hospital Course (Signed)
71 year old male with a history of COPD, tobacco abuse, OSA on CPAP, hypertension, diabetes mellitus type 2, and obesity presenting with 1 week history of shortness of breath and lower extremity edema.  The patient was already having the above symptoms when he and his family went on a Sojourn to [facility].  During her surgery, the patient developed worsening shortness of breath and edema.  He went to the local hospital and [facility] where he was noted to have new onset atrial fibrillation with decompensated CHF.  Initial ABG at that time showed 7.3 7/62/52/35 on room air.  He was placed on BiPAP and diuresed.  He was subsequently weaned to 3 L nasal cannula.  He left AGAINST MEDICAL ADVICE to come back home to [facility]. The patient endorses lower extremity edema for some time with significant worsening over the past week.  He continues to smoke 1-1/2 packs/day.  He also complains of orthopnea type symptoms and increasing abdominal girth.  He denies any fevers, chills, headache, chest pain, abdominal pain, nausea, vomiting, diarrhea, hematochezia, melena. In the ED, the patient was afebrile hemodynamically stable with oxygen saturation 95-100% on 4 L.  WBC 9.6, hemoglobin 16.7, platelets 135,000.  Sodium 137, potassium 3.8, serum creatinine 1.21.

## 2022-02-26 NOTE — Discharge Instructions (Addendum)
Information on my medicine - ELIQUIS (apixaban)  This medication education was reviewed with me or my healthcare representative as part of my discharge preparation.   Why was Eliquis prescribed for you? Eliquis was prescribed for you to reduce the risk of a blood clot forming that can cause a stroke if you have a medical condition called atrial fibrillation (a type of irregular heartbeat).  What do You need to know about Eliquis ? Take your Eliquis TWICE DAILY - one tablet in the morning and one tablet in the evening with or without food. If you have difficulty swallowing the tablet whole please discuss with your pharmacist how to take the medication safely.  Take Eliquis exactly as prescribed by your doctor and DO NOT stop taking Eliquis without talking to the doctor who prescribed the medication.  Stopping may increase your risk of developing a stroke.  Refill your prescription before you run out.  After discharge, you should have regular check-up appointments with your healthcare provider that is prescribing your Eliquis.  In the future your dose may need to be changed if your kidney function or weight changes by a significant amount or as you get older.  What do you do if you miss a dose? If you miss a dose, take it as soon as you remember on the same day and resume taking twice daily.  Do not take more than one dose of ELIQUIS at the same time to make up a missed dose.  Important Safety Information A possible side effect of Eliquis is bleeding. You should call your healthcare provider right away if you experience any of the following: Bleeding from an injury or your nose that does not stop. Unusual colored urine (red or dark [place]) or unusual colored stools (red or black). Unusual bruising for unknown reasons. A serious fall or if you hit your head (even if there is no bleeding).  Some medicines may interact with Eliquis and might increase your risk of bleeding or clotting while  on Eliquis. To help avoid this, consult your healthcare provider or pharmacist prior to using any new prescription or non-prescription medications, including herbals, vitamins, non-steroidal anti-inflammatory drugs (NSAIDs) and supplements.  This website has more information on Eliquis (apixaban): http://[email or web address].com/eliquis/home      IMPORTANT INFORMATION: PAY CLOSE ATTENTION   PHYSICIAN DISCHARGE INSTRUCTIONS  Follow with Primary care provider  [name], MD  and other consultants as instructed by your Hospitalist Physician  [facility] IF SYMPTOMS COME BACK, WORSEN OR NEW PROBLEM DEVELOPS   Please note: You were cared for by a hospitalist during your hospital stay. Every effort will be made to forward records to your primary care provider.  You can request that your primary care provider send for your hospital records if they have not received them.  Once you are discharged, your primary care physician will handle any further medical issues. Please note that NO REFILLS for any discharge medications will be authorized once you are discharged, as it is imperative that you return to your primary care physician (or establish a relationship with a primary care physician if you do not have one) for your post hospital discharge needs so that they can reassess your need for medications and monitor your lab values.  Please get a complete blood count and chemistry panel checked by your Primary MD at your next visit, and again as instructed by your Primary MD.  Get Medicines reviewed and adjusted: Please take all  your medications with you for your next visit with your Primary MD  Laboratory/radiological data: Please request your Primary MD to go over all hospital tests and procedure/radiological results at the follow up, please ask your primary care provider to get all Hospital records sent to his/her office.  In some cases, they will be blood work,  cultures and biopsy results pending at the time of your discharge. Please request that your primary care provider follow up on these results.  If you are diabetic, please bring your blood sugar readings with you to your follow up appointment with primary care.    Please call and make your follow up appointments as soon as possible.    Also Note the following: If you experience worsening of your admission symptoms, develop shortness of breath, life threatening emergency, suicidal or homicidal thoughts you must seek medical attention immediately by calling 911 or calling your MD immediately  if symptoms less severe.  You must read complete instructions/literature along with all the possible adverse reactions/side effects for all the Medicines you take and that have been prescribed to you. Take any new Medicines after you have completely understood and accpet all the possible adverse reactions/side effects.   Do not drive when taking Pain medications or sleeping medications (Benzodiazepines)  Do not take more than prescribed Pain, Sleep and Anxiety Medications. It is not advisable to combine anxiety,sleep and pain medications without talking with your primary care practitioner  Special Instructions: If you have smoked or chewed Tobacco  in the last 2 yrs please stop smoking, stop any regular Alcohol  and or any Recreational drug use.  Wear Seat belts while driving.  Do not drive if taking any narcotic, mind altering or controlled substances or recreational drugs or alcohol.

## 2022-02-27 DIAGNOSIS — J441 Chronic obstructive pulmonary disease with (acute) exacerbation: Secondary | ICD-10-CM | POA: Diagnosis not present

## 2022-02-27 DIAGNOSIS — I5033 Acute on chronic diastolic (congestive) heart failure: Secondary | ICD-10-CM | POA: Diagnosis not present

## 2022-02-27 DIAGNOSIS — I4819 Other persistent atrial fibrillation: Secondary | ICD-10-CM | POA: Diagnosis not present

## 2022-02-27 DIAGNOSIS — I4891 Unspecified atrial fibrillation: Secondary | ICD-10-CM | POA: Diagnosis not present

## 2022-02-27 LAB — BASIC METABOLIC PANEL
Anion gap: 12 (ref 5–15)
BUN: 23 mg/dL (ref 8–23)
CO2: 33 mmol/L — ABNORMAL HIGH (ref 22–32)
Calcium: 8.4 mg/dL — ABNORMAL LOW (ref 8.9–10.3)
Chloride: 95 mmol/L — ABNORMAL LOW (ref 98–111)
Creatinine, Ser: 0.97 mg/dL (ref 0.61–1.24)
GFR, Estimated: >60 mL/min (ref >60)
Glucose, Bld: 175 mg/dL — ABNORMAL HIGH (ref 70–99)
Potassium: 3.9 mmol/L (ref 3.5–5.1)
Sodium: 140 mmol/L (ref 135–145)

## 2022-02-27 LAB — LIPID PANEL
Cholesterol: 140 mg/dL (ref 0–200)
HDL: 59 mg/dL (ref >40)
LDL Cholesterol: 73 mg/dL (ref 0–99)
Total CHOL/HDL Ratio: 2.4 RATIO
Triglycerides: 38 mg/dL (ref <150)
VLDL: 8 mg/dL (ref 0–40)

## 2022-02-27 LAB — HEPATITIS B SURFACE ANTIGEN

## 2022-02-27 LAB — GLUCOSE, CAPILLARY
Glucose-Capillary: 185 mg/dL — ABNORMAL HIGH (ref 70–99)
Glucose-Capillary: 253 mg/dL — ABNORMAL HIGH (ref 70–99)
Glucose-Capillary: 263 mg/dL — ABNORMAL HIGH (ref 70–99)
Glucose-Capillary: 268 mg/dL — ABNORMAL HIGH (ref 70–99)
Glucose-Capillary: 420 mg/dL — ABNORMAL HIGH (ref 70–99)

## 2022-02-27 LAB — MAGNESIUM: Magnesium: 2.5 mg/dL — ABNORMAL HIGH (ref 1.7–2.4)

## 2022-02-27 LAB — HEPATITIS C ANTIBODY: HCV Ab: NONREACTIVE — AB

## 2022-02-27 MED ORDER — INSULIN GLARGINE-YFGN 100 UNIT/ML ~~LOC~~ SOLN
15.0000 [IU] | Freq: Every day | SUBCUTANEOUS | Status: DC
  Administered 2022-02-27: 15 [IU] via SUBCUTANEOUS
  Filled 2022-02-27 (×2): qty 0.15

## 2022-02-27 MED ORDER — SENNA 8.6 MG PO TABS
2.0000 | ORAL_TABLET | Freq: Every day | ORAL | Status: DC
  Administered 2022-02-27 – 2022-03-01 (×3): 17.2 mg via ORAL
  Filled 2022-02-27 (×3): qty 2

## 2022-02-27 MED ORDER — POLYETHYLENE GLYCOL 3350 17 G PO PACK
17.0000 g | PACK | Freq: Every day | ORAL | Status: DC
  Administered 2022-02-27 – 2022-03-01 (×3): 17 g via ORAL
  Filled 2022-02-27 (×3): qty 1

## 2022-02-27 NOTE — Progress Notes (Signed)
Primary Cardiologist:  Marlou Porch  Subjective:  Less dyspnea eating breakfast   Objective:  Vitals:   02/26/22 2034 02/26/22 2203 02/27/22 0514 02/27/22 0734  BP:  (!) 94/59 95/66   Pulse:  75 71   Resp:  20 18   Temp:  97.6 F (36.4 C) 98.2 F (36.8 C)   TempSrc:  Oral Oral   SpO2: 95% 94% 95% 96%  Weight:   (!) 151 kg   Height:        Intake/Output from previous day:  Intake/Output Summary (Last 24 hours) at 02/27/2022 0805 Last data filed at 02/27/2022 5681 Gross per 24 hour  Intake 1240 ml  Output 5550 ml  Net -4310 ml    Physical Exam: Obese male OSA Lungs clear  Distant heart sounds Abdomen benign Plus 3 LE edema with stasis and erythema   Lab Results: Basic Metabolic Panel: Recent Labs    02/26/22 0433 02/27/22 0424  NA 139 140  K 3.5 3.9  CL 97* 95*  CO2 34* 33*  GLUCOSE 98 175*  BUN 22 23  CREATININE 0.96 0.97  CALCIUM 8.3* 8.4*  MG 2.4 2.5*  PHOS 4.3  --    Liver Function Tests: Recent Labs    02/26/22 0433  AST 36  ALT 25  ALKPHOS 59  BILITOT 1.1  PROT 6.6  ALBUMIN 3.4*   No results for input(s): LIPASE, AMYLASE in the last 72 hours. CBC: Recent Labs    02/25/22 2028 02/26/22 0433  WBC 9.6 8.2  HGB 16.7 17.0  HCT 53.0* 53.8*  MCV 99.8 98.9  PLT 135* 129*    Fasting Lipid Panel: Recent Labs    02/27/22 0424  CHOL 140  HDL 59  LDLCALC 73  TRIG 38  CHOLHDL 2.4   Thyroid Function Tests: Recent Labs    02/26/22 0922  TSH 1.990   Anemia Panel: Recent Labs    02/26/22 0922  VITAMINB12 689  FOLATE 18.6    Imaging: DG Chest 2 View  Result Date: 02/25/2022 CLINICAL DATA:  [facility] EXAM: CHEST - 2 VIEW COMPARISON:  October 25, 2021, November 14, 2020 FINDINGS: The cardiomediastinal silhouette is unchanged and enlarged in contour. No pleural effusion. No pneumothorax. Perihilar vascular prominence, peribronchial cuffing and diffuse interstitial prominence. Possible nodular density in the region of the lingula seen on  lateral imaging. Visualized abdomen is unremarkable. Mild degenerative changes of the thoracic spine. IMPRESSION: 1. Constellation of findings are most consistent with pulmonary edema. 2. Query nodular density of the lingula. This could reflect an area of atelectasis. Consider follow-up PA and lateral chest radiograph in 4 weeks after resolution of acute symptomatology versus dedicated cross-sectional imaging. Electronically Signed   By: Valentino Saxon M.D.   On: 02/25/2022 20:28   ECHOCARDIOGRAM COMPLETE  Result Date: 02/26/2022    ECHOCARDIOGRAM REPORT   Patient Name:   Steven Henry Date of Exam: 02/26/2022 Medical Rec #:  [id number]           Height:       73.0 in Accession #:    [id number]          Weight:       339.4 lb Date of Birth:  1951/02/11           BSA:          2.694 m Patient Age:    71 years            BP:  93/79 mmHg Patient Gender: M                   HR:           74 bpm. Exam Location:  [facility] Procedure: 2D Echo, Cardiac Doppler, Color Doppler and Intracardiac            Opacification Agent Indications:    CHF  History:        Patient has prior history of Echocardiogram examinations and                 Patient has no prior history of Echocardiogram examinations.                 CHF, COPD; Risk Factors:Hypertension, Diabetes, Dyslipidemia and                 Current Smoker. Patient is on Bi-Pap.  Sonographer:    Wenda Low Referring Phys: [id number] OLADAPO ADEFESO  Sonographer Comments: Patient is morbidly obese. Image acquisition challenging due to COPD. IMPRESSIONS  1. Left ventricular ejection fraction, by estimation, is 65 to 70%. The left ventricle has normal function. The left ventricle has no regional wall motion abnormalities. There is mild left ventricular hypertrophy. Left ventricular diastolic parameters are indeterminate.  2. Right ventricular systolic function is normal. The right ventricular size is moderately enlarged. There is moderately elevated  pulmonary artery systolic pressure. The estimated right ventricular systolic pressure is 96.2 mmHg.  3. Left atrial size was mildly dilated.  4. Right atrial size was moderately dilated.  5. The mitral valve is normal in structure. No evidence of mitral valve regurgitation. No evidence of mitral stenosis.  6. The aortic valve is calcified. There is moderate calcification of the aortic valve. There is mild thickening of the aortic valve. Aortic valve regurgitation is not visualized. Aortic valve sclerosis is present, with no evidence of aortic valve stenosis.  7. Aortic dilatation noted. There is mild dilatation of the aortic root, measuring 41 mm. There is mild dilatation of the ascending aorta, measuring 41 mm.  8. The inferior vena cava is dilated in size with <50% respiratory variability, suggesting right atrial pressure of 15 mmHg. FINDINGS  Left Ventricle: Left ventricular ejection fraction, by estimation, is 65 to 70%. The left ventricle has normal function. The left ventricle has no regional wall motion abnormalities. Definity contrast agent was given IV to delineate the left ventricular  endocardial borders. The left ventricular internal cavity size was normal in size. There is mild left ventricular hypertrophy. Left ventricular diastolic parameters are indeterminate. Right Ventricle: The right ventricular size is moderately enlarged. No increase in right ventricular wall thickness. Right ventricular systolic function is normal. There is moderately elevated pulmonary artery systolic pressure. The tricuspid regurgitant  velocity is 3.23 m/s, and with an assumed right atrial pressure of 15 mmHg, the estimated right ventricular systolic pressure is 22.9 mmHg. Left Atrium: Left atrial size was mildly dilated. Right Atrium: Right atrial size was moderately dilated. Pericardium: There is no evidence of pericardial effusion. Mitral Valve: The mitral valve is normal in structure. No evidence of mitral valve  regurgitation. No evidence of mitral valve stenosis. MV peak gradient, 4.8 mmHg. The mean mitral valve gradient is 2.0 mmHg. Tricuspid Valve: The tricuspid valve is normal in structure. Tricuspid valve regurgitation is mild . No evidence of tricuspid stenosis. Aortic Valve: The aortic valve is calcified. There is moderate calcification of the aortic valve. There is mild thickening of the aortic  valve. Aortic valve regurgitation is not visualized. Aortic valve sclerosis is present, with no evidence of aortic valve stenosis. Aortic valve mean gradient measures 23.5 mmHg. Aortic valve peak gradient measures 44.1 mmHg. Aortic valve area, by VTI measures 1.45 cm. Pulmonic Valve: The pulmonic valve was normal in structure. Pulmonic valve regurgitation is not visualized. No evidence of pulmonic stenosis. Aorta: Aortic dilatation noted. There is mild dilatation of the aortic root, measuring 41 mm. There is mild dilatation of the ascending aorta, measuring 41 mm. Venous: The inferior vena cava is dilated in size with less than 50% respiratory variability, suggesting right atrial pressure of 15 mmHg. IAS/Shunts: No atrial level shunt detected by color flow Doppler.  LEFT VENTRICLE PLAX 2D LVIDd:         5.10 cm   Diastology LVIDs:         3.80 cm   LV e' lateral:   12.50 cm/s LV PW:         1.40 cm   LV E/e' lateral: 8.2 LV IVS:        1.40 cm LVOT diam:     2.10 cm LV SV:         92 LV SV Index:   34 LVOT Area:     3.46 cm  RIGHT VENTRICLE RV Basal diam:  5.60 cm LEFT ATRIUM              Index        RIGHT ATRIUM           Index LA diam:        6.20 cm  2.30 cm/m   RA Area:     33.90 cm LA Vol (A2C):   108.0 ml 40.09 ml/m  RA Volume:   130.00 ml 48.26 ml/m LA Vol (A4C):   121.0 ml 44.92 ml/m LA Biplane Vol: 118.0 ml 43.80 ml/m  AORTIC VALVE                     PULMONIC VALVE AV Area (Vmax):    1.42 cm      PV Vmax:       0.71 m/s AV Area (Vmean):   1.38 cm      PV Peak grad:  2.0 mmHg AV Area (VTI):     1.45 cm AV  Vmax:           332.00 cm/s AV Vmean:          222.500 cm/s AV VTI:            0.633 m AV Peak Grad:      44.1 mmHg AV Mean Grad:      23.5 mmHg LVOT Vmax:         136.50 cm/s LVOT Vmean:        88.750 cm/s LVOT VTI:          0.266 m LVOT/AV VTI ratio: 0.42  AORTA Ao Root diam: 4.10 cm Ao Asc diam:  4.10 cm MITRAL VALVE                TRICUSPID VALVE MV Area (PHT): 3.87 cm     TR Peak grad:   41.7 mmHg MV Area VTI:   3.85 cm     TR Vmax:        323.00 cm/s MV Peak grad:  4.8 mmHg MV Mean grad:  2.0 mmHg     SHUNTS MV Vmax:       1.09 m/s  Systemic VTI:  0.27 m MV Vmean:      59.4 cm/s    Systemic Diam: 2.10 cm MV Decel Time: 196 msec MV E velocity: 102.00 cm/s Candee Furbish MD Electronically signed by Candee Furbish MD Signature Date/Time: 02/26/2022/4:12:26 PM    Final     Cardiac Studies:  ECG: afib RBBB no acute changes   Telemetry:  NSR 02/27/2022   Echo: EF 65-70% Normal RV AV sclerosis   Medications:    apixaban  5 mg Oral BID   arformoterol  15 mcg Nebulization BID   atorvastatin  40 mg Oral Daily   budesonide (PULMICORT) nebulizer solution  0.5 mg Nebulization BID   furosemide  80 mg Intravenous BID   insulin aspart  0-15 Units Subcutaneous TID WC   insulin aspart  0-5 Units Subcutaneous QHS   insulin glargine-yfgn  10 Units Subcutaneous QHS   ipratropium-albuterol  3 mL Nebulization TID   methylPREDNISolone (SOLU-MEDROL) injection  60 mg Intravenous Q12H   nicotine  21 mg Transdermal Daily      Assessment/Plan:  Steven Henry is a 71 y.o. male with a hx of HTN, DM2, morbid obesity, COPD, OSA on CPAP, PVD with stenting in 2019, tobacco abuse who is being seen 02/26/2022 for the evaluation of Acute CHF at the request of Dr. Carles Collet.  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

## 2022-02-27 NOTE — Progress Notes (Signed)
PROGRESS NOTE  Steven Henry AQT:[id number] DOB: [birth year] DOA: 02/25/2022 PCP: [name], MD  Brief History:  71 year old male with a history of COPD, tobacco abuse, OSA on CPAP, hypertension, diabetes mellitus type 2, and obesity presenting with 1 week history of shortness of breath and lower extremity edema.  The patient was already having the above symptoms when he and his family went on a [place]journ to [facility] [place].  During her surgery, the patient developed worsening shortness of breath and edema.  He went to the local hospital and [facility] where he was noted to have new onset atrial fibrillation with decompensated CHF.  Initial ABG at that time showed 7.3 7/62/52/35 on room air.  He was placed on BiPAP and diuresed.  He was subsequently weaned to 3 L nasal cannula.  He left AGAINST MEDICAL ADVICE to come back home to [facility]. The patient endorses lower extremity edema for some time with significant worsening over the past week.  He continues to smoke 1-1/2 packs/day.  He also complains of orthopnea type symptoms and increasing abdominal girth.  He denies any fevers, chills, headache, chest pain, abdominal pain, nausea, vomiting, diarrhea, hematochezia, melena. In the ED, the patient was afebrile hemodynamically stable with oxygen saturation 95-100% on 4 L.  WBC 9.6, hemoglobin 16.7, platelets 135,000.  Sodium 137, potassium 3.8, serum creatinine 1.21.    Assessment and Plan: * Acute on chronic diastolic CHF (congestive heart failure) (HCC) Echo EF 65-70%, no WMA, mod elevated PASP ReDS reading 6/6 = 41 Remains clinically fluid overloaded Continue lasix to 80 mg bid IV Daily weights Accurate I/Os-NEG 4.3L Appreciate cardiology  Atrial fibrillation (HCC) Type unspecified Rate controlled presently TSH--1.990 EchoEF 65-70, no WMA CHADSVASC2 = 5 -apixaban started  Acute on chronic respiratory failure with hypoxia and hypercapnia  (HCC) Chronically on 2L at hs Now on 4L Due to COPD exacerbation and pulm edema Wean for saturation >90%  Thrombocytopenia (HCC) Likely due to chronic hepatic congestion -hep B surface antigen -hep C antibody -B12--689 -folate--18.6 -TSH--1.990  Mixed hyperlipidemia Continue statin  Type 2 diabetes mellitus with hyperglycemia (HCC) Noncompliant with Lantus at home Glyxambi on hold Holding metformin and glipizide Start novolog sliding scale Check A1C -09/15/21 A1C--10.1 -increase semglee to 15 units  Tobacco use Tobacco cessation discussed  Essential hypertension Holding losartan to allow BP margin for diuresis  COPD with acute exacerbation (HCC) Continue pulmicort and brovana Continue duonebs Continue IV solumedrol  Obstructive sleep apnea Continue nighttime CPAP  Morbid obesity (HCC) BMI 44.78 Lifestyle modification       Family Communication:   daughter updated 6/6  Consultants:  cardiology  Code Status:  FULL   DVT Prophylaxis:  apixaban   Procedures: As Listed in Progress Note Above  Antibiotics: None       Subjective: Pt is breathing better.  Denies cp, sob, n/v/d, abd pain  Objective: Vitals:   02/27/22 0900 02/27/22 1202 02/27/22 1254 02/27/22 1358  BP: 110/71 96/60 117/73 104/67  Pulse: 77 84 94 94  Resp: '14 17 20 20  '$ Temp: 97.9 F (36.6 C) 97.7 F (36.5 C) 98.6 F (37 C) 97.7 F (36.5 C)  TempSrc: Oral Oral Oral Axillary  SpO2: 95% 92% 90% 92%  Weight:      Height:        Intake/Output Summary (Last 24 hours) at 02/27/2022 1523 Last data filed at 02/27/2022 1253 Gross per 24 hour  Intake 1250 ml  Output 5950 ml  Net -4700 ml   Weight change: -5.697 kg Exam:  General:  Pt is alert, follows commands appropriately, not in acute distress HEENT: No icterus, No thrush, No neck mass, [place]/AT Cardiovascular: RRR, S1/S2, no rubs, no gallops Respiratory: bibasilar wheeze.  Bilateral scattered rales Abdomen: Soft/+BS, non  tender, non distended, no guarding Extremities: 2 + LE edema, No lymphangitis, No petechiae, No rashes, no synovitis   Data Reviewed: I have personally reviewed following labs and imaging studies Basic Metabolic Panel: Recent Labs  Lab 02/25/22 2028 02/26/22 0433 02/27/22 0424  NA 137 139 140  K 3.8 3.5 3.9  CL 99 97* 95*  CO2 33* 34* 33*  GLUCOSE 165* 98 175*  BUN 25* 22 23  CREATININE 1.21 0.96 0.97  CALCIUM 8.4* 8.3* 8.4*  MG  --  2.4 2.5*  PHOS  --  4.3  --    Liver Function Tests: Recent Labs  Lab 02/26/22 0433  AST 36  ALT 25  ALKPHOS 59  BILITOT 1.1  PROT 6.6  ALBUMIN 3.4*   No results for input(s): LIPASE, AMYLASE in the last 168 hours. No results for input(s): AMMONIA in the last 168 hours. Coagulation Profile: No results for input(s): INR, PROTIME in the last 168 hours. CBC: Recent Labs  Lab 02/25/22 2028 02/26/22 0433  WBC 9.6 8.2  HGB 16.7 17.0  HCT 53.0* 53.8*  MCV 99.8 98.9  PLT 135* 129*   Cardiac Enzymes: No results for input(s): CKTOTAL, CKMB, CKMBINDEX, TROPONINI in the last 168 hours. BNP: Invalid input(s): POCBNP CBG: Recent Labs  Lab 02/26/22 1105 02/26/22 1629 02/26/22 2127 02/27/22 0731 02/27/22 1112  GLUCAP 135* 253* 214* 185* 253*   HbA1C: No results for input(s): HGBA1C in the last 72 hours. Urine analysis:    Component Value Date/Time   COLORURINE YELLOW 06/14/2013 [place] 06/14/2013 1545   LABSPEC 1.015 06/14/2013 1545   PHURINE 7.0 06/14/2013 1545   GLUCOSEU NEGATIVE 06/14/2013 1545   HGBUR NEGATIVE 06/14/2013 1545   BILIRUBINUR NEGATIVE 06/14/2013 1545   KETONESUR NEGATIVE 06/14/2013 1545   PROTEINUR TRACE (A) 06/14/2013 1545   UROBILINOGEN 0.2 06/14/2013 1545   NITRITE NEGATIVE 06/14/2013 1545   LEUKOCYTESUR NEGATIVE 06/14/2013 1545   Sepsis Labs: '@LABRCNTIP'$ (procalcitonin:4,lacticidven:4) )No results found for this or any previous visit (from the past 240 hour(s)).   Scheduled Meds:   apixaban  5 mg Oral BID   arformoterol  15 mcg Nebulization BID   atorvastatin  40 mg Oral Daily   budesonide (PULMICORT) nebulizer solution  0.5 mg Nebulization BID   furosemide  80 mg Intravenous BID   insulin aspart  0-15 Units Subcutaneous TID WC   insulin aspart  0-5 Units Subcutaneous QHS   insulin glargine-yfgn  10 Units Subcutaneous QHS   ipratropium-albuterol  3 mL Nebulization TID   methylPREDNISolone (SOLU-MEDROL) injection  60 mg Intravenous Q12H   nicotine  21 mg Transdermal Daily   polyethylene glycol  17 g Oral Daily   senna  2 tablet Oral Daily   Continuous Infusions:  Procedures/Studies: DG Chest 2 View  Result Date: 02/25/2022 CLINICAL DATA:  [facility] EXAM: CHEST - 2 VIEW COMPARISON:  October 25, 2021, November 14, 2020 FINDINGS: The cardiomediastinal silhouette is unchanged and enlarged in contour. No pleural effusion. No pneumothorax. Perihilar vascular prominence, peribronchial cuffing and diffuse interstitial prominence. Possible nodular density in the region of the lingula seen on lateral imaging. Visualized abdomen is unremarkable. Mild degenerative  changes of the thoracic spine. IMPRESSION: 1. Constellation of findings are most consistent with pulmonary edema. 2. Query nodular density of the lingula. This could reflect an area of atelectasis. Consider follow-up PA and lateral chest radiograph in 4 weeks after resolution of acute symptomatology versus dedicated cross-sectional imaging. Electronically Signed   By: Valentino Saxon M.D.   On: 02/25/2022 20:28   ECHOCARDIOGRAM COMPLETE  Result Date: 02/26/2022    ECHOCARDIOGRAM REPORT   Patient Name:   Steven Henry Date of Exam: 02/26/2022 Medical Rec #:  [id number]           Height:       73.0 in Accession #:    [id number]          Weight:       339.4 lb Date of Birth:  07/17/1951           BSA:          2.694 m Patient Age:    66 years            BP:           93/79 mmHg Patient Gender: M                   HR:            74 bpm. Exam Location:  [facility] Procedure: 2D Echo, Cardiac Doppler, Color Doppler and Intracardiac            Opacification Agent Indications:    CHF  History:        Patient has prior history of Echocardiogram examinations and                 Patient has no prior history of Echocardiogram examinations.                 CHF, COPD; Risk Factors:Hypertension, Diabetes, Dyslipidemia and                 Current Smoker. Patient is on Bi-Pap.  Sonographer:    Wenda Low Referring Phys: [id number] OLADAPO ADEFESO  Sonographer Comments: Patient is morbidly obese. Image acquisition challenging due to COPD. IMPRESSIONS  1. Left ventricular ejection fraction, by estimation, is 65 to 70%. The left ventricle has normal function. The left ventricle has no regional wall motion abnormalities. There is mild left ventricular hypertrophy. Left ventricular diastolic parameters are indeterminate.  2. Right ventricular systolic function is normal. The right ventricular size is moderately enlarged. There is moderately elevated pulmonary artery systolic pressure. The estimated right ventricular systolic pressure is 54.6 mmHg.  3. Left atrial size was mildly dilated.  4. Right atrial size was moderately dilated.  5. The mitral valve is normal in structure. No evidence of mitral valve regurgitation. No evidence of mitral stenosis.  6. The aortic valve is calcified. There is moderate calcification of the aortic valve. There is mild thickening of the aortic valve. Aortic valve regurgitation is not visualized. Aortic valve sclerosis is present, with no evidence of aortic valve stenosis.  7. Aortic dilatation noted. There is mild dilatation of the aortic root, measuring 41 mm. There is mild dilatation of the ascending aorta, measuring 41 mm.  8. The inferior vena cava is dilated in size with <50% respiratory variability, suggesting right atrial pressure of 15 mmHg. FINDINGS  Left Ventricle: Left ventricular ejection fraction, by  estimation, is 65 to 70%. The left ventricle has normal function. The left ventricle has no regional wall motion abnormalities.  Definity contrast agent was given IV to delineate the left ventricular  endocardial borders. The left ventricular internal cavity size was normal in size. There is mild left ventricular hypertrophy. Left ventricular diastolic parameters are indeterminate. Right Ventricle: The right ventricular size is moderately enlarged. No increase in right ventricular wall thickness. Right ventricular systolic function is normal. There is moderately elevated pulmonary artery systolic pressure. The tricuspid regurgitant  velocity is 3.23 m/s, and with an assumed right atrial pressure of 15 mmHg, the estimated right ventricular systolic pressure is 09.3 mmHg. Left Atrium: Left atrial size was mildly dilated. Right Atrium: Right atrial size was moderately dilated. Pericardium: There is no evidence of pericardial effusion. Mitral Valve: The mitral valve is normal in structure. No evidence of mitral valve regurgitation. No evidence of mitral valve stenosis. MV peak gradient, 4.8 mmHg. The mean mitral valve gradient is 2.0 mmHg. Tricuspid Valve: The tricuspid valve is normal in structure. Tricuspid valve regurgitation is mild . No evidence of tricuspid stenosis. Aortic Valve: The aortic valve is calcified. There is moderate calcification of the aortic valve. There is mild thickening of the aortic valve. Aortic valve regurgitation is not visualized. Aortic valve sclerosis is present, with no evidence of aortic valve stenosis. Aortic valve mean gradient measures 23.5 mmHg. Aortic valve peak gradient measures 44.1 mmHg. Aortic valve area, by VTI measures 1.45 cm. Pulmonic Valve: The pulmonic valve was normal in structure. Pulmonic valve regurgitation is not visualized. No evidence of pulmonic stenosis. Aorta: Aortic dilatation noted. There is mild dilatation of the aortic root, measuring 41 mm. There is mild  dilatation of the ascending aorta, measuring 41 mm. Venous: The inferior vena cava is dilated in size with less than 50% respiratory variability, suggesting right atrial pressure of 15 mmHg. IAS/Shunts: No atrial level shunt detected by color flow Doppler.  LEFT VENTRICLE PLAX 2D LVIDd:         5.10 cm   Diastology LVIDs:         3.80 cm   LV e' lateral:   12.50 cm/s LV PW:         1.40 cm   LV E/e' lateral: 8.2 LV IVS:        1.40 cm LVOT diam:     2.10 cm LV SV:         92 LV SV Index:   34 LVOT Area:     3.46 cm  RIGHT VENTRICLE RV Basal diam:  5.60 cm LEFT ATRIUM              Index        RIGHT ATRIUM           Index LA diam:        6.20 cm  2.30 cm/m   RA Area:     33.90 cm LA Vol (A2C):   108.0 ml 40.09 ml/m  RA Volume:   130.00 ml 48.26 ml/m LA Vol (A4C):   121.0 ml 44.92 ml/m LA Biplane Vol: 118.0 ml 43.80 ml/m  AORTIC VALVE                     PULMONIC VALVE AV Area (Vmax):    1.42 cm      PV Vmax:       0.71 m/s AV Area (Vmean):   1.38 cm      PV Peak grad:  2.0 mmHg AV Area (VTI):     1.45 cm AV Vmax:  332.00 cm/s AV Vmean:          222.500 cm/s AV VTI:            0.633 m AV Peak Grad:      44.1 mmHg AV Mean Grad:      23.5 mmHg LVOT Vmax:         136.50 cm/s LVOT Vmean:        88.750 cm/s LVOT VTI:          0.266 m LVOT/AV VTI ratio: 0.42  AORTA Ao Root diam: 4.10 cm Ao Asc diam:  4.10 cm MITRAL VALVE                TRICUSPID VALVE MV Area (PHT): 3.87 cm     TR Peak grad:   41.7 mmHg MV Area VTI:   3.85 cm     TR Vmax:        323.00 cm/s MV Peak grad:  4.8 mmHg MV Mean grad:  2.0 mmHg     SHUNTS MV Vmax:       1.09 m/s     Systemic VTI:  0.27 m MV Vmean:      59.4 cm/s    Systemic Diam: 2.10 cm MV Decel Time: 196 msec MV E velocity: 102.00 cm/s Candee Furbish MD Electronically signed by Candee Furbish MD Signature Date/Time: 02/26/2022/4:12:26 PM    Final     Orson Eva, DO  [facility]  If 7PM-7AM, please contact night-coverage [email or web address] Password [email or web address] 02/27/2022, 3:23 PM    LOS: 2 days

## 2022-02-27 NOTE — Progress Notes (Signed)
   02/27/22 1100  ReDS Vest / Clip  Station Marker D  Ruler Value 41  ReDS Value Range (!) > 40

## 2022-02-28 DIAGNOSIS — I5033 Acute on chronic diastolic (congestive) heart failure: Secondary | ICD-10-CM | POA: Diagnosis not present

## 2022-02-28 DIAGNOSIS — J441 Chronic obstructive pulmonary disease with (acute) exacerbation: Secondary | ICD-10-CM | POA: Diagnosis not present

## 2022-02-28 DIAGNOSIS — I10 Essential (primary) hypertension: Secondary | ICD-10-CM | POA: Diagnosis not present

## 2022-02-28 DIAGNOSIS — I4819 Other persistent atrial fibrillation: Secondary | ICD-10-CM | POA: Diagnosis not present

## 2022-02-28 LAB — BASIC METABOLIC PANEL
Anion gap: 11 (ref 5–15)
BUN: 24 mg/dL — ABNORMAL HIGH (ref 8–23)
CO2: 34 mmol/L — ABNORMAL HIGH (ref 22–32)
Calcium: 8.5 mg/dL — ABNORMAL LOW (ref 8.9–10.3)
Chloride: 93 mmol/L — ABNORMAL LOW (ref 98–111)
Creatinine, Ser: 0.92 mg/dL (ref 0.61–1.24)
GFR, Estimated: >60 mL/min (ref >60)
Glucose, Bld: 203 mg/dL — ABNORMAL HIGH (ref 70–99)
Potassium: 4.2 mmol/L (ref 3.5–5.1)
Sodium: 138 mmol/L (ref 135–145)

## 2022-02-28 LAB — GLUCOSE, CAPILLARY
Glucose-Capillary: 207 mg/dL — ABNORMAL HIGH (ref 70–99)
Glucose-Capillary: 260 mg/dL — ABNORMAL HIGH (ref 70–99)
Glucose-Capillary: 259 mg/dL — ABNORMAL HIGH (ref 70–99)
Glucose-Capillary: 361 mg/dL — ABNORMAL HIGH (ref 70–99)

## 2022-02-28 MED ORDER — INSULIN ASPART 100 UNIT/ML IJ SOLN
5.0000 [IU] | Freq: Once | INTRAMUSCULAR | Status: AC
  Administered 2022-02-28: 5 [IU] via SUBCUTANEOUS

## 2022-02-28 MED ORDER — METHYLPREDNISOLONE SODIUM SUCC 40 MG IJ SOLR
40.0000 mg | Freq: Two times a day (BID) | INTRAMUSCULAR | Status: DC
  Administered 2022-02-28 – 2022-03-01 (×3): 40 mg via INTRAVENOUS
  Filled 2022-02-28 (×3): qty 1

## 2022-02-28 MED ORDER — INSULIN ASPART 100 UNIT/ML IJ SOLN
6.0000 [IU] | Freq: Three times a day (TID) | INTRAMUSCULAR | Status: DC
  Administered 2022-03-01 (×2): 6 [IU] via SUBCUTANEOUS

## 2022-02-28 MED ORDER — INSULIN GLARGINE-YFGN 100 UNIT/ML ~~LOC~~ SOLN
18.0000 [IU] | Freq: Every day | SUBCUTANEOUS | Status: DC
  Administered 2022-02-28: 18 [IU] via SUBCUTANEOUS
  Filled 2022-02-28 (×2): qty 0.18

## 2022-02-28 NOTE — Progress Notes (Signed)
PROGRESS NOTE   Steven Henry  GGE:[id number] DOB: [birth year] DOA: 02/25/2022 PCP: [name], MD   Chief Complaint  Patient presents with   Congestive Heart Failure   Level of care: Med-Surg  Brief Admission History:  71 year old male with a history of COPD, tobacco abuse, OSA on CPAP, hypertension, diabetes mellitus type 2, and obesity presenting with 1 week history of shortness of breath and lower extremity edema.  The patient was already having the above symptoms when he and his family went on a [place]journ to [facility] [place].  During her surgery, the patient developed worsening shortness of breath and edema.  He went to the local hospital [place] [facility] where he was noted to have new onset atrial fibrillation with decompensated CHF.  Initial ABG at that time showed 7.3 7/62/52/35 on room air.  He was placed on BiPAP and diuresed.  He was subsequently weaned to 3 L nasal cannula.  He left AGAINST MEDICAL ADVICE to come back home to [facility]. The patient endorses lower extremity edema for some time with significant worsening over the past week.  He continues to smoke 1-1/2 packs/day.  He also complains of orthopnea type symptoms and increasing abdominal girth.  He denies any fevers, chills, headache, chest pain, abdominal pain, nausea, vomiting, diarrhea, hematochezia, melena. In the ED, the patient was afebrile hemodynamically stable with oxygen saturation 95-100% on 4 L.  WBC 9.6, hemoglobin 16.7, platelets 135,000.  Sodium 137, potassium 3.8, serum creatinine 1.21.   Assessment and Plan: * Acute on chronic diastolic CHF (congestive heart failure) (HCC) Echo EF 65-70%, no WMA, mod elevated PASP ReDS reading 6/6 = 41 Remains clinically fluid overloaded Continue lasix to 80 mg bid IV Daily weights Accurate I/Os-NEG  Appreciate cardiology  I/O last 3 completed shifts: In: 38 [P.O.:1570] Out: 8350 [Urine:8350] Total I/O In: 1140 [P.O.:1140] Out: 2000  [Urine:2000]    Intake/Output Summary (Last 24 hours) at 02/28/2022 1735 Last data filed at 02/28/2022 1700 Gross per 24 hour  Intake 1620 ml  Output 5050 ml  Net -3430 ml   Filed Weights   02/26/22 0633 02/27/22 0514 02/28/22 0511  Weight: (!) 154 kg (!) 151 kg (!) 151 kg     Atrial fibrillation (HCC) Type unspecified Rate controlled presently TSH--1.990 EchoEF 65-70, no WMA CHADSVASC2 = 5 -apixaban started  Acute on chronic respiratory failure with hypoxia and hypercapnia (HCC) Chronically on 2L at hs Now on 4L Due to COPD exacerbation and pulm edema Wean for saturation >90%  Thrombocytopenia (HCC) Likely due to chronic hepatic congestion -hep B surface antigen -hep C antibody -B12--689 -folate--18.6 -TSH--1.990  Mixed hyperlipidemia Continue statin  Type 2 diabetes mellitus with hyperglycemia (HCC) Noncompliant with Lantus at home Glyxambi on hold Holding metformin and glipizide Start novolog sliding scale Check A1C -09/15/21 A1C--10.1 -increase semglee to 18 units, added prandial coverage 6 units TIDAC.  CBG (last 3)  Recent Labs    02/28/22 0740 02/28/22 1130 02/28/22 1643  GLUCAP 207* 260* 259*     Tobacco use Tobacco cessation discussed  Essential hypertension Holding losartan to allow BP margin for diuresis  COPD with acute exacerbation (HCC) Continue pulmicort and brovana Continue duonebs Continue IV solumedrol  Obstructive sleep apnea Continue nighttime CPAP  Morbid obesity (HCC) BMI 44.78 Lifestyle modification  DVT prophylaxis: apixaban Code Status: full  Family Communication:  Disposition: Status is: Inpatient Remains inpatient appropriate because: remains on IV furosemide for diuresis    Consultants:  heartCare  Procedures:   Antimicrobials:    Subjective: Pt reporting no chest pain or palpitations, still diuresing lots of fluid.  Cough improved.  Objective: Vitals:   02/28/22 0816 02/28/22 0817 02/28/22 1434  02/28/22 1442  BP:   124/84   Pulse:   84   Resp:   17   Temp:   97.8 F (36.6 C)   TempSrc:      SpO2: 100% 98% 96% 90%  Weight:      Height:        Intake/Output Summary (Last 24 hours) at 02/28/2022 1738 Last data filed at 02/28/2022 1700 Gross per 24 hour  Intake 1620 ml  Output 5050 ml  Net -3430 ml   Filed Weights   02/26/22 0633 02/27/22 0514 02/28/22 0511  Weight: (!) 154 kg (!) 151 kg (!) 151 kg   Examination:  General exam: Appears calm and comfortable  Respiratory system: Clear to auscultation. Respiratory effort normal. Cardiovascular system: normal S1 & S2 heard. No JVD, murmurs, rubs, gallops or clicks. No pedal edema. Gastrointestinal system: Abdomen is nondistended, soft and nontender. No organomegaly or masses felt. Normal bowel sounds heard. Central nervous system: Alert and oriented. No focal neurological deficits. Extremities: 1+ edema BLEs pitting. Symmetric 5 x 5 power. Skin: No rashes, lesions or ulcers. Psychiatry: Judgement and insight appear normal. Mood & affect appropriate.   Data Reviewed: I have personally reviewed following labs and imaging studies  CBC: Recent Labs  Lab 02/25/22 2028 02/26/22 0433  WBC 9.6 8.2  HGB 16.7 17.0  HCT 53.0* 53.8*  MCV 99.8 98.9  PLT 135* 129*    Basic Metabolic Panel: Recent Labs  Lab 02/25/22 2028 02/26/22 0433 02/27/22 0424 02/28/22 0437  NA 137 139 140 138  K 3.8 3.5 3.9 4.2  CL 99 97* 95* 93*  CO2 33* 34* 33* 34*  GLUCOSE 165* 98 175* 203*  BUN 25* 22 23 24*  CREATININE 1.21 0.96 0.97 0.92  CALCIUM 8.4* 8.3* 8.4* 8.5*  MG  --  2.4 2.5*  --   PHOS  --  4.3  --   --     CBG: Recent Labs  Lab 02/27/22 2101 02/27/22 2358 02/28/22 0740 02/28/22 1130 02/28/22 1643  GLUCAP 420* 263* 207* 260* 259*    No results found for this or any previous visit (from the past 240 hour(s)).   Radiology Studies: No results found.  Scheduled Meds:  apixaban  5 mg Oral BID   arformoterol  15 mcg  Nebulization BID   atorvastatin  40 mg Oral Daily   budesonide (PULMICORT) nebulizer solution  0.5 mg Nebulization BID   furosemide  80 mg Intravenous BID   insulin aspart  0-15 Units Subcutaneous TID WC   insulin aspart  0-5 Units Subcutaneous QHS   [START ON 03/01/2022] insulin aspart  6 Units Subcutaneous TID WC   insulin glargine-yfgn  18 Units Subcutaneous QHS   ipratropium-albuterol  3 mL Nebulization TID   methylPREDNISolone (SOLU-MEDROL) injection  40 mg Intravenous Q12H   nicotine  21 mg Transdermal Daily   polyethylene glycol  17 g Oral Daily   senna  2 tablet Oral Daily   Continuous Infusions:   LOS: 3 days   Time spent: 36 mins  Braley Luckenbaugh Wynetta Emery, MD How to contact the [facility] Attending or Consulting provider [place] or covering provider during after hours [place], for this patient?  Check the care team in [facility] and look for a) attending/consulting [facility] provider listed  and b) the [facility] team listed Log into [email or web address] and use [place]'s universal password to access. If you do not have the password, please contact the hospital operator. Locate the [facility] provider you are looking for under [facility] and page to a number that you can be directly reached. If you still have difficulty reaching the provider, please page the [facility] (Director on Call) for the Hospitalists listed on amion for assistance.  02/28/2022, 5:38 PM

## 2022-02-28 NOTE — Progress Notes (Signed)
Primary Cardiologist:  Marlou Porch  Subjective:  Dyspnea improved still with LE edema and volume overload   Objective:  Vitals:   02/28/22 0511 02/28/22 0810 02/28/22 0816 02/28/22 0817  BP:      Pulse:      Resp:      Temp:      TempSrc:      SpO2:  95% 100% 98%  Weight: (!) 151 kg     Height:        Intake/Output from previous day:  Intake/Output Summary (Last 24 hours) at 02/28/2022 0829 Last data filed at 02/28/2022 0500 Gross per 24 hour  Intake 1330 ml  Output 5850 ml  Net -4520 ml    Physical Exam: Obese male OSA Lungs clear  Distant heart sounds Abdomen benign Plus 3 LE edema with stasis and erythema   Lab Results: Basic Metabolic Panel: Recent Labs    02/26/22 0433 02/27/22 0424 02/28/22 0437  NA 139 140 138  K 3.5 3.9 4.2  CL 97* 95* 93*  CO2 34* 33* 34*  GLUCOSE 98 175* 203*  BUN 22 23 24*  CREATININE 0.96 0.97 0.92  CALCIUM 8.3* 8.4* 8.5*  MG 2.4 2.5*  --   PHOS 4.3  --   --    Liver Function Tests: Recent Labs    02/26/22 0433  AST 36  ALT 25  ALKPHOS 59  BILITOT 1.1  PROT 6.6  ALBUMIN 3.4*   No results for input(s): LIPASE, AMYLASE in the last 72 hours. CBC: Recent Labs    02/25/22 2028 02/26/22 0433  WBC 9.6 8.2  HGB 16.7 17.0  HCT 53.0* 53.8*  MCV 99.8 98.9  PLT 135* 129*    Fasting Lipid Panel: Recent Labs    02/27/22 0424  CHOL 140  HDL 59  LDLCALC 73  TRIG 38  CHOLHDL 2.4   Thyroid Function Tests: Recent Labs    02/26/22 0922  TSH 1.990   Anemia Panel: Recent Labs    02/26/22 0922  VITAMINB12 689  FOLATE 18.6    Imaging: ECHOCARDIOGRAM COMPLETE  Result Date: 02/26/2022    ECHOCARDIOGRAM REPORT   Patient Name:   Steven Henry Date of Exam: 02/26/2022 Medical Rec #:  [id number]           Height:       73.0 in Accession #:    [id number]          Weight:       339.4 lb Date of Birth:  02-21-1951           BSA:          2.694 m Patient Age:    71 years            BP:           93/79 mmHg Patient  Gender: M                   HR:           74 bpm. Exam Location:  [facility] Procedure: 2D Echo, Cardiac Doppler, Color Doppler and Intracardiac            Opacification Agent Indications:    CHF  History:        Patient has prior history of Echocardiogram examinations and                 Patient has no prior history of Echocardiogram examinations.  CHF, COPD; Risk Factors:Hypertension, Diabetes, Dyslipidemia and                 Current Smoker. Patient is on Bi-Pap.  Sonographer:    Wenda Low Referring Phys: [id number] OLADAPO ADEFESO  Sonographer Comments: Patient is morbidly obese. Image acquisition challenging due to COPD. IMPRESSIONS  1. Left ventricular ejection fraction, by estimation, is 65 to 70%. The left ventricle has normal function. The left ventricle has no regional wall motion abnormalities. There is mild left ventricular hypertrophy. Left ventricular diastolic parameters are indeterminate.  2. Right ventricular systolic function is normal. The right ventricular size is moderately enlarged. There is moderately elevated pulmonary artery systolic pressure. The estimated right ventricular systolic pressure is 83.3 mmHg.  3. Left atrial size was mildly dilated.  4. Right atrial size was moderately dilated.  5. The mitral valve is normal in structure. No evidence of mitral valve regurgitation. No evidence of mitral stenosis.  6. The aortic valve is calcified. There is moderate calcification of the aortic valve. There is mild thickening of the aortic valve. Aortic valve regurgitation is not visualized. Aortic valve sclerosis is present, with no evidence of aortic valve stenosis.  7. Aortic dilatation noted. There is mild dilatation of the aortic root, measuring 41 mm. There is mild dilatation of the ascending aorta, measuring 41 mm.  8. The inferior vena cava is dilated in size with <50% respiratory variability, suggesting right atrial pressure of 15 mmHg. FINDINGS  Left Ventricle: Left  ventricular ejection fraction, by estimation, is 65 to 70%. The left ventricle has normal function. The left ventricle has no regional wall motion abnormalities. Definity contrast agent was given IV to delineate the left ventricular  endocardial borders. The left ventricular internal cavity size was normal in size. There is mild left ventricular hypertrophy. Left ventricular diastolic parameters are indeterminate. Right Ventricle: The right ventricular size is moderately enlarged. No increase in right ventricular wall thickness. Right ventricular systolic function is normal. There is moderately elevated pulmonary artery systolic pressure. The tricuspid regurgitant  velocity is 3.23 m/s, and with an assumed right atrial pressure of 15 mmHg, the estimated right ventricular systolic pressure is 82.5 mmHg. Left Atrium: Left atrial size was mildly dilated. Right Atrium: Right atrial size was moderately dilated. Pericardium: There is no evidence of pericardial effusion. Mitral Valve: The mitral valve is normal in structure. No evidence of mitral valve regurgitation. No evidence of mitral valve stenosis. MV peak gradient, 4.8 mmHg. The mean mitral valve gradient is 2.0 mmHg. Tricuspid Valve: The tricuspid valve is normal in structure. Tricuspid valve regurgitation is mild . No evidence of tricuspid stenosis. Aortic Valve: The aortic valve is calcified. There is moderate calcification of the aortic valve. There is mild thickening of the aortic valve. Aortic valve regurgitation is not visualized. Aortic valve sclerosis is present, with no evidence of aortic valve stenosis. Aortic valve mean gradient measures 23.5 mmHg. Aortic valve peak gradient measures 44.1 mmHg. Aortic valve area, by VTI measures 1.45 cm. Pulmonic Valve: The pulmonic valve was normal in structure. Pulmonic valve regurgitation is not visualized. No evidence of pulmonic stenosis. Aorta: Aortic dilatation noted. There is mild dilatation of the aortic root,  measuring 41 mm. There is mild dilatation of the ascending aorta, measuring 41 mm. Venous: The inferior vena cava is dilated in size with less than 50% respiratory variability, suggesting right atrial pressure of 15 mmHg. IAS/Shunts: No atrial level shunt detected by color flow Doppler.  LEFT VENTRICLE PLAX 2D LVIDd:  5.10 cm   Diastology LVIDs:         3.80 cm   LV e' lateral:   12.50 cm/s LV PW:         1.40 cm   LV E/e' lateral: 8.2 LV IVS:        1.40 cm LVOT diam:     2.10 cm LV SV:         92 LV SV Index:   34 LVOT Area:     3.46 cm  RIGHT VENTRICLE RV Basal diam:  5.60 cm LEFT ATRIUM              Index        RIGHT ATRIUM           Index LA diam:        6.20 cm  2.30 cm/m   RA Area:     33.90 cm LA Vol (A2C):   108.0 ml 40.09 ml/m  RA Volume:   130.00 ml 48.26 ml/m LA Vol (A4C):   121.0 ml 44.92 ml/m LA Biplane Vol: 118.0 ml 43.80 ml/m  AORTIC VALVE                     PULMONIC VALVE AV Area (Vmax):    1.42 cm      PV Vmax:       0.71 m/s AV Area (Vmean):   1.38 cm      PV Peak grad:  2.0 mmHg AV Area (VTI):     1.45 cm AV Vmax:           332.00 cm/s AV Vmean:          222.500 cm/s AV VTI:            0.633 m AV Peak Grad:      44.1 mmHg AV Mean Grad:      23.5 mmHg LVOT Vmax:         136.50 cm/s LVOT Vmean:        88.750 cm/s LVOT VTI:          0.266 m LVOT/AV VTI ratio: 0.42  AORTA Ao Root diam: 4.10 cm Ao Asc diam:  4.10 cm MITRAL VALVE                TRICUSPID VALVE MV Area (PHT): 3.87 cm     TR Peak grad:   41.7 mmHg MV Area VTI:   3.85 cm     TR Vmax:        323.00 cm/s MV Peak grad:  4.8 mmHg MV Mean grad:  2.0 mmHg     SHUNTS MV Vmax:       1.09 m/s     Systemic VTI:  0.27 m MV Vmean:      59.4 cm/s    Systemic Diam: 2.10 cm MV Decel Time: 196 msec MV E velocity: 102.00 cm/s Candee Furbish MD Electronically signed by Candee Furbish MD Signature Date/Time: 02/26/2022/4:12:26 PM    Final     Cardiac Studies:  ECG: afib RBBB no acute changes   Telemetry:  NSR 02/28/2022   Echo: EF 65-70%  Normal RV AV sclerosis   Medications:    apixaban  5 mg Oral BID   arformoterol  15 mcg Nebulization BID   atorvastatin  40 mg Oral Daily   budesonide (PULMICORT) nebulizer solution  0.5 mg Nebulization BID   furosemide  80 mg Intravenous BID   insulin aspart  0-15 Units Subcutaneous  TID WC   insulin aspart  0-5 Units Subcutaneous QHS   insulin glargine-yfgn  15 Units Subcutaneous QHS   ipratropium-albuterol  3 mL Nebulization TID   methylPREDNISolone (SOLU-MEDROL) injection  40 mg Intravenous Q12H   nicotine  21 mg Transdermal Daily   polyethylene glycol  17 g Oral Daily   senna  2 tablet Oral Daily      Assessment/Plan:  Steven Henry is a 71 y.o. male with a hx of HTN, DM2, morbid obesity, COPD, OSA on CPAP, PVD with stenting in 2019, tobacco abuse who is being seen 02/26/2022 for the evaluation of Acute CHF at the request of Dr. Carles Collet.  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

## 2022-03-01 DIAGNOSIS — J9621 Acute and chronic respiratory failure with hypoxia: Secondary | ICD-10-CM | POA: Diagnosis not present

## 2022-03-01 DIAGNOSIS — I4819 Other persistent atrial fibrillation: Secondary | ICD-10-CM

## 2022-03-01 DIAGNOSIS — I5033 Acute on chronic diastolic (congestive) heart failure: Secondary | ICD-10-CM | POA: Diagnosis not present

## 2022-03-01 DIAGNOSIS — J441 Chronic obstructive pulmonary disease with (acute) exacerbation: Secondary | ICD-10-CM | POA: Diagnosis not present

## 2022-03-01 LAB — MAGNESIUM: Magnesium: 2.5 mg/dL — ABNORMAL HIGH (ref 1.7–2.4)

## 2022-03-01 LAB — GLUCOSE, CAPILLARY
Glucose-Capillary: 264 mg/dL — ABNORMAL HIGH (ref 70–99)
Glucose-Capillary: 230 mg/dL — ABNORMAL HIGH (ref 70–99)
Glucose-Capillary: 319 mg/dL — ABNORMAL HIGH (ref 70–99)

## 2022-03-01 LAB — BASIC METABOLIC PANEL
Anion gap: 9 (ref 5–15)
BUN: 23 mg/dL (ref 8–23)
CO2: 33 mmol/L — ABNORMAL HIGH (ref 22–32)
Calcium: 8.2 mg/dL — ABNORMAL LOW (ref 8.9–10.3)
Chloride: 94 mmol/L — ABNORMAL LOW (ref 98–111)
Creatinine, Ser: 0.84 mg/dL (ref 0.61–1.24)
GFR, Estimated: >60 mL/min (ref >60)
Glucose, Bld: 228 mg/dL — ABNORMAL HIGH (ref 70–99)
Potassium: 4.1 mmol/L (ref 3.5–5.1)
Sodium: 136 mmol/L (ref 135–145)

## 2022-03-01 MED ORDER — INSULIN GLARGINE-YFGN 100 UNIT/ML ~~LOC~~ SOLN
24.0000 [IU] | Freq: Every day | SUBCUTANEOUS | Status: DC
  Filled 2022-03-01: qty 0.24

## 2022-03-01 MED ORDER — APIXABAN 5 MG PO TABS: 5.0000 mg | ORAL_TABLET | Freq: Two times a day (BID) | ORAL | 2 refills | Status: DC

## 2022-03-01 MED ORDER — FUROSEMIDE 20 MG PO TABS: 60.0000 mg | ORAL_TABLET | Freq: Two times a day (BID) | ORAL | 1 refills | Status: DC

## 2022-03-01 MED ORDER — INSULIN ASPART 100 UNIT/ML IJ SOLN: 8.0000 [IU] | Freq: Three times a day (TID) | INTRAMUSCULAR | Status: DC

## 2022-03-01 MED ORDER — POTASSIUM CHLORIDE CRYS ER 10 MEQ PO TBCR: 10.0000 meq | EXTENDED_RELEASE_TABLET | Freq: Every day | ORAL | 1 refills | Status: DC

## 2022-03-01 MED ORDER — IPRATROPIUM-ALBUTEROL 0.5-2.5 (3) MG/3ML IN SOLN: 3.0000 mL | Freq: Three times a day (TID) | RESPIRATORY_TRACT | 1 refills | Status: AC

## 2022-03-01 MED ORDER — PREDNISONE 20 MG PO TABS: ORAL_TABLET | ORAL | 0 refills | Status: DC

## 2022-03-01 NOTE — Progress Notes (Signed)
   03/01/22 1155  ReDS Vest / Clip  Station Marker D  Ruler Value 41  ReDS Value Range 36 - 40  ReDS Actual Value 36    Reds Clip- 36%  Patient states his breathing has improved and has had increased output that is now more clear.

## 2022-03-01 NOTE — Progress Notes (Signed)
Patient discharged home today, transported home by family. Discharge paperwork went over with patient, patient verbalized understanding. Belongings sent home with patient. Patient stated he did not have neb machine at home MD Wynetta Emery and [facility] made aware. Will be sent to patients home. Patients oxygen saturation while ambulating was 94% at Room Air. MD Wynetta Emery made aware.

## 2022-03-01 NOTE — Care Management Important Message (Signed)
Important Message  Patient Details  Name: Steven Henry MRN: [id number] Date of Birth: Jan 06, 1951   Medicare Important Message Given:  Yes     Tommy Medal 03/01/2022, 3:07 PM

## 2022-03-01 NOTE — Discharge Summary (Signed)
Physician Discharge Summary  Steven Henry ZOX:[id number] DOB: 15-Nov-1950 DOA: 02/25/2022  PCP: [name], MD Cardiology: Dr. Marlou Porch  Admit date: 02/25/2022 Discharge date: 03/01/2022  Admitted From:  Home  Disposition:  Home   Recommendations for Outpatient Follow-up:  Follow up with PCP in 1 weeks Follow up with [facility] [place] in 4-6 weeks Please obtain BMP/CBC in 1-2 weeks with PCP  Discharge Condition: Stable   CODE STATUS: Full  DIET: Heart healthy / carb modified, 2 gm sodium restricted   Brief Hospitalization Summary: Please see all hospital notes, images, labs for full details of the hospitalization. 71 year old male with a history of COPD, tobacco abuse, OSA on CPAP, hypertension, diabetes mellitus type 2, and obesity presenting with 1 week history of shortness of breath and lower extremity edema.  The patient was already having the above symptoms when he and his family went on a [place]journ [place] [facility].  During her surgery, the patient developed worsening shortness of breath and edema.  He went to the local hospital and [facility] where he was noted to have new onset atrial fibrillation with decompensated CHF.  Initial ABG at that time showed 7.3 7/62/52/35 on room air.  He was placed on BiPAP and diuresed.  He was subsequently weaned to 3 L nasal cannula.  He left AGAINST MEDICAL ADVICE to come back home to [facility]. The patient endorses lower extremity edema for some time with significant worsening over the past week.  He continues to smoke 1-1/2 packs/day.  He also complains of orthopnea type symptoms and increasing abdominal girth.  He denies any fevers, chills, headache, chest pain, abdominal pain, nausea, vomiting, diarrhea, hematochezia, melena. In the ED, the patient was afebrile hemodynamically stable with oxygen saturation 95-100% on 4 L.  WBC 9.6, hemoglobin 16.7, platelets 135,000.  Sodium 137, potassium 3.8, serum creatinine  1.21.  Hospital Course by Problem list   Assessment and Plan: * Acute on chronic diastolic CHF (congestive heart failure) (HCC) Echo EF 65-70%, no WMA, mod elevated PASP ReDS reading 6/6 = 41 Remains clinically fluid overloaded Continue lasix to 80 mg bid IV Daily weights Accurate I/Os-NEG  Appreciate cardiology  I/O last 3 completed shifts: In: 2100 [P.O.:2100] Out: 8119 [Urine:6650] Total I/O In: 240 [P.O.:240] Out: -     Intake/Output Summary (Last 24 hours) at 03/01/2022 1258 Last data filed at 03/01/2022 0900 Gross per 24 hour  Intake 1140 ml  Output 3000 ml  Net -1860 ml   Filed Weights   02/27/22 0514 02/28/22 0511 03/01/22 0500  Weight: (!) 151 kg (!) 151 kg (!) 149.1 kg    ReDs Vest: 36% 03/01/2022  Discussed with Dr. Domenic Polite, Can DC home today on lasix 60 mg BID with KCl 10 meq daily and outpatient follow up in 4-6 weeks.      Atrial fibrillation (HCC) Type unspecified Rate controlled presently TSH--1.990 EchoEF 65-70, no WMA CHADSVASC2 = 5 -apixaban started  Acute on chronic respiratory failure with hypoxia and hypercapnia (HCC) Chronically on 2L at hs Now on 4L, weaning down to baseline of 3L Due to COPD exacerbation and pulm edema Wean for saturation >90%  Thrombocytopenia (HCC) Likely due to chronic hepatic congestion -hep B surface antigen -hep C antibody -B12--689 -folate--18.6 -TSH--1.990  Mixed hyperlipidemia Continue statin  Type 2 diabetes mellitus with hyperglycemia (HCC) Noncompliant with Lantus at home - advised to start using it again [facility] on hold in hospital but resume at DC  Holding metformin and  glipizide in hospital - resume at DC  novolog sliding scale -09/15/21 A1C--10.1 -increase semglee to 24 units, added prandial coverage 8 units TIDAC.  CBG (last 3)  Recent Labs    03/01/22 0256 03/01/22 0730 03/01/22 1114  GLUCAP 264* 230* 319*   Resume home treatment at discharge   Tobacco use Tobacco cessation  discussed  Essential hypertension Holding losartan to allow BP margin for diuresis Can revisit starting back on outpatient basis per cardiology  COPD with acute exacerbation ([place]) Continue pulmicort and brovana Continue duonebs He was treated with IV solumedrol and he is improving well and can discharge home today.   Obstructive sleep apnea Continue to offer CPAP  Morbid obesity (HCC) BMI 44.78 Lifestyle modification  Discharge Diagnoses:  Principal Problem:   Acute on chronic diastolic CHF (congestive heart failure) (HCC) Active Problems:   Morbid obesity ([place])   Obstructive sleep apnea   COPD with acute exacerbation (HCC)   Essential hypertension   Tobacco use   Type 2 diabetes mellitus with hyperglycemia (HCC)   Mixed hyperlipidemia   Thrombocytopenia (HCC)   Acute on chronic respiratory failure with hypoxia and hypercapnia (HCC)   Atrial fibrillation [facility])   Discharge Instructions: Discharge Instructions     Ambulatory referral to Cardiology   Complete by: As directed    Hospital follow up 4-6 weeks with [facility] or APP      Allergies as of 03/01/2022   No Known Allergies      Medication List     STOP taking these medications    aspirin EC 81 MG tablet   losartan 50 MG tablet Commonly known as: COZAAR       TAKE these medications    Accu-Chek FastClix Lancet Kit Check BS QAM - DX: E11.9   Accu-Chek FastClix Lancets Misc Check BS QAM - DX: E11.9   apixaban 5 MG Tabs tablet Commonly known as: ELIQUIS Take 1 tablet (5 mg total) by mouth 2 (two) times daily.   atorvastatin 40 MG tablet Commonly known as: LIPITOR Take 1 tablet (40 mg total) by mouth daily.   furosemide 20 MG tablet Commonly known as: LASIX Take 3 tablets (60 mg total) by mouth 2 (two) times daily. What changed:  medication strength how much to take   glipiZIDE 10 MG tablet Commonly known as: GLUCOTROL TAKE 1 TABLET BY MOUTH EVERY DAY BEFORE breakfast What changed: See  the new instructions.   Glyxambi 25-5 MG Tabs Generic drug: Empagliflozin-linaGLIPtin TAKE 1 TABLET BY MOUTH DAILY   insulin glargine 100 UNIT/ML injection Commonly known as: Lantus Inject 0.15 mLs (15 Units total) into the skin daily. PLEASE INCREASE INSULIN BY 1 UNIT DAILY UNTIL FBG IS <130, THEN HOLD AT THAT DOSE   ipratropium-albuterol 0.5-2.5 (3) MG/3ML Soln Commonly known as: DUONEB Take 3 mLs by nebulization 3 (three) times daily.   metFORMIN 1000 MG tablet Commonly known as: GLUCOPHAGE TAKE 1 TABLET BY MOUTH TWICE DAILY WITH a meal   multivitamin capsule Take 1 capsule by mouth daily. Mens one a day   ONE TOUCH ULTRA 2 w/Device Kit USETO CHECK BLOOD SUGAR EVERY MORNING   OneTouch Ultra test strip Generic drug: glucose blood USE TO CHECK BLOOD SUGAR EVERY MORNING   potassium chloride 10 MEQ tablet Commonly known as: KLOR-CON M Take 1 tablet (10 mEq total) by mouth daily.   predniSONE 20 MG tablet Commonly known as: DELTASONE Take 3 PO QAM x3days, 2 PO QAM x3days, 1 PO QAM x3days Start taking on: March 02, 2022   Vitamin D3 10 MCG (400 UNIT) tablet Take 800 Units by mouth daily.   ZINC PO Take 1 tablet by mouth daily.        Follow-up Information     [name], MD. Schedule an appointment as soon as possible for a visit in 1 week(s).   Specialty: Family Medicine Why: Hospital Follow Up Contact information: [address] [place] [address] [phone]         Jerline Pain, MD. Schedule an appointment as soon as possible for a visit in 4 week(s).   Specialty: Cardiology Why: Hospital Follow Up Contact information: [address] [address] [phone]                No Known Allergies Allergies as of 03/01/2022   No Known Allergies      Medication List     STOP taking these medications    aspirin EC 81 MG tablet   losartan 50 MG tablet Commonly known as: COZAAR       TAKE  these medications    Accu-Chek FastClix Lancet Kit Check BS QAM - DX: E11.9   Accu-Chek FastClix Lancets Misc Check BS QAM - DX: E11.9   apixaban 5 MG Tabs tablet Commonly known as: ELIQUIS Take 1 tablet (5 mg total) by mouth 2 (two) times daily.   atorvastatin 40 MG tablet Commonly known as: LIPITOR Take 1 tablet (40 mg total) by mouth daily.   furosemide 20 MG tablet Commonly known as: LASIX Take 3 tablets (60 mg total) by mouth 2 (two) times daily. What changed:  medication strength how much to take   glipiZIDE 10 MG tablet Commonly known as: GLUCOTROL TAKE 1 TABLET BY MOUTH EVERY DAY BEFORE breakfast What changed: See the new instructions.   Glyxambi 25-5 MG Tabs Generic drug: Empagliflozin-linaGLIPtin TAKE 1 TABLET BY MOUTH DAILY   insulin glargine 100 UNIT/ML injection Commonly known as: Lantus Inject 0.15 mLs (15 Units total) into the skin daily. PLEASE INCREASE INSULIN BY 1 UNIT DAILY UNTIL FBG IS <130, THEN HOLD AT THAT DOSE   ipratropium-albuterol 0.5-2.5 (3) MG/3ML Soln Commonly known as: DUONEB Take 3 mLs by nebulization 3 (three) times daily.   metFORMIN 1000 MG tablet Commonly known as: GLUCOPHAGE TAKE 1 TABLET BY MOUTH TWICE DAILY WITH a meal   multivitamin capsule Take 1 capsule by mouth daily. Mens one a day   ONE TOUCH ULTRA 2 w/Device Kit USETO CHECK BLOOD SUGAR EVERY MORNING   OneTouch Ultra test strip Generic drug: glucose blood USE TO CHECK BLOOD SUGAR EVERY MORNING   potassium chloride 10 MEQ tablet Commonly known as: KLOR-CON M Take 1 tablet (10 mEq total) by mouth daily.   predniSONE 20 MG tablet Commonly known as: DELTASONE Take 3 PO QAM x3days, 2 PO QAM x3days, 1 PO QAM x3days Start taking on: March 02, 2022   Vitamin D3 10 MCG (400 UNIT) tablet Take 800 Units by mouth daily.   ZINC PO Take 1 tablet by mouth daily.        Procedures/Studies: ECHOCARDIOGRAM COMPLETE  Result Date: 02/26/2022    ECHOCARDIOGRAM REPORT    Patient Name:   HOLBERT CAPLES Date of Exam: 02/26/2022 Medical Rec #:  [id number]           Height:       73.0 in Accession #:    [id number]  Weight:       339.4 lb Date of Birth:  [birth year]           BSA:          2.694 m Patient Age:    71 years            BP:           93/79 mmHg Patient Gender: M                   HR:           74 bpm. Exam Location:  [facility] Procedure: 2D Echo, Cardiac Doppler, Color Doppler and Intracardiac            Opacification Agent Indications:    CHF  History:        Patient has prior history of Echocardiogram examinations and                 Patient has no prior history of Echocardiogram examinations.                 CHF, COPD; Risk Factors:Hypertension, Diabetes, Dyslipidemia and                 Current Smoker. Patient is on Bi-Pap.  Sonographer:    Wenda Low Referring Phys: [id number] OLADAPO ADEFESO  Sonographer Comments: Patient is morbidly obese. Image acquisition challenging due to COPD. IMPRESSIONS  1. Left ventricular ejection fraction, by estimation, is 65 to 70%. The left ventricle has normal function. The left ventricle has no regional wall motion abnormalities. There is mild left ventricular hypertrophy. Left ventricular diastolic parameters are indeterminate.  2. Right ventricular systolic function is normal. The right ventricular size is moderately enlarged. There is moderately elevated pulmonary artery systolic pressure. The estimated right ventricular systolic pressure is 35.3 mmHg.  3. Left atrial size was mildly dilated.  4. Right atrial size was moderately dilated.  5. The mitral valve is normal in structure. No evidence of mitral valve regurgitation. No evidence of mitral stenosis.  6. The aortic valve is calcified. There is moderate calcification of the aortic valve. There is mild thickening of the aortic valve. Aortic valve regurgitation is not visualized. Aortic valve sclerosis is present, with no evidence of aortic valve stenosis.  7. Aortic  dilatation noted. There is mild dilatation of the aortic root, measuring 41 mm. There is mild dilatation of the ascending aorta, measuring 41 mm.  8. The inferior vena cava is dilated in size with <50% respiratory variability, suggesting right atrial pressure of 15 mmHg. FINDINGS  Left Ventricle: Left ventricular ejection fraction, by estimation, is 65 to 70%. The left ventricle has normal function. The left ventricle has no regional wall motion abnormalities. Definity contrast agent was given IV to delineate the left ventricular  endocardial borders. The left ventricular internal cavity size was normal in size. There is mild left ventricular hypertrophy. Left ventricular diastolic parameters are indeterminate. Right Ventricle: The right ventricular size is moderately enlarged. No increase in right ventricular wall thickness. Right ventricular systolic function is normal. There is moderately elevated pulmonary artery systolic pressure. The tricuspid regurgitant  velocity is 3.23 m/s, and with an assumed right atrial pressure of 15 mmHg, the estimated right ventricular systolic pressure is 61.4 mmHg. Left Atrium: Left atrial size was mildly dilated. Right Atrium: Right atrial size was moderately dilated. Pericardium: There is no evidence of pericardial effusion. Mitral Valve: The mitral valve is normal in structure. No evidence of  mitral valve regurgitation. No evidence of mitral valve stenosis. MV peak gradient, 4.8 mmHg. The mean mitral valve gradient is 2.0 mmHg. Tricuspid Valve: The tricuspid valve is normal in structure. Tricuspid valve regurgitation is mild . No evidence of tricuspid stenosis. Aortic Valve: The aortic valve is calcified. There is moderate calcification of the aortic valve. There is mild thickening of the aortic valve. Aortic valve regurgitation is not visualized. Aortic valve sclerosis is present, with no evidence of aortic valve stenosis. Aortic valve mean gradient measures 23.5 mmHg. Aortic  valve peak gradient measures 44.1 mmHg. Aortic valve area, by VTI measures 1.45 cm. Pulmonic Valve: The pulmonic valve was normal in structure. Pulmonic valve regurgitation is not visualized. No evidence of pulmonic stenosis. Aorta: Aortic dilatation noted. There is mild dilatation of the aortic root, measuring 41 mm. There is mild dilatation of the ascending aorta, measuring 41 mm. Venous: The inferior vena cava is dilated in size with less than 50% respiratory variability, suggesting right atrial pressure of 15 mmHg. IAS/Shunts: No atrial level shunt detected by color flow Doppler.  LEFT VENTRICLE PLAX 2D LVIDd:         5.10 cm   Diastology LVIDs:         3.80 cm   LV e' lateral:   12.50 cm/s LV PW:         1.40 cm   LV E/e' lateral: 8.2 LV IVS:        1.40 cm LVOT diam:     2.10 cm LV SV:         92 LV SV Index:   34 LVOT Area:     3.46 cm  RIGHT VENTRICLE RV Basal diam:  5.60 cm LEFT ATRIUM              Index        RIGHT ATRIUM           Index LA diam:        6.20 cm  2.30 cm/m   RA Area:     33.90 cm LA Vol (A2C):   108.0 ml 40.09 ml/m  RA Volume:   130.00 ml 48.26 ml/m LA Vol (A4C):   121.0 ml 44.92 ml/m LA Biplane Vol: 118.0 ml 43.80 ml/m  AORTIC VALVE                     PULMONIC VALVE AV Area (Vmax):    1.42 cm      PV Vmax:       0.71 m/s AV Area (Vmean):   1.38 cm      PV Peak grad:  2.0 mmHg AV Area (VTI):     1.45 cm AV Vmax:           332.00 cm/s AV Vmean:          222.500 cm/s AV VTI:            0.633 m AV Peak Grad:      44.1 mmHg AV Mean Grad:      23.5 mmHg LVOT Vmax:         136.50 cm/s LVOT Vmean:        88.750 cm/s LVOT VTI:          0.266 m LVOT/AV VTI ratio: 0.42  AORTA Ao Root diam: 4.10 cm Ao Asc diam:  4.10 cm MITRAL VALVE                TRICUSPID VALVE MV Area (PHT):  3.87 cm     TR Peak grad:   41.7 mmHg MV Area VTI:   3.85 cm     TR Vmax:        323.00 cm/s MV Peak grad:  4.8 mmHg MV Mean grad:  2.0 mmHg     SHUNTS MV Vmax:       1.09 m/s     Systemic VTI:  0.27 m MV Vmean:       59.4 cm/s    Systemic Diam: 2.10 cm MV Decel Time: 196 msec MV E velocity: 102.00 cm/s Candee Furbish MD Electronically signed by Candee Furbish MD Signature Date/Time: 02/26/2022/4:12:26 PM    Final    DG Chest 2 View  Result Date: 02/25/2022 CLINICAL DATA:  Sh[facility] EXAM: CHEST - 2 VIEW COMPARISON:  October 25, 2021, November 14, 2020 FINDINGS: The cardiomediastinal silhouette is unchanged and enlarged in contour. No pleural effusion. No pneumothorax. Perihilar vascular prominence, peribronchial cuffing and diffuse interstitial prominence. Possible nodular density in the region of the lingula seen on lateral imaging. Visualized abdomen is unremarkable. Mild degenerative changes of the thoracic spine. IMPRESSION: 1. Constellation of findings are most consistent with pulmonary edema. 2. Query nodular density of the lingula. This could reflect an area of atelectasis. Consider follow-up PA and lateral chest radiograph in 4 weeks after resolution of acute symptomatology versus dedicated cross-sectional imaging. Electronically Signed   By: Valentino Saxon M.D.   On: 02/25/2022 20:28     Subjective: Pt reports overall feeling much better.  No SOB.  He has been urinating frequently on lasix.    Discharge Exam: Vitals:   03/01/22 0851 03/01/22 1237  BP:  119/73  Pulse:  94  Resp:  20  Temp:  97.6 F (36.4 C)  SpO2: 98% 90%   Vitals:   03/01/22 0839 03/01/22 0841 03/01/22 0851 03/01/22 1237  BP:    119/73  Pulse:    94  Resp:    20  Temp:    97.6 F (36.4 C)  TempSrc:    Axillary  SpO2: 93% 98% 98% 90%  Weight:      Height:       General: Pt is morbidly obese, sitting up in chair, alert, awake, not in acute distress Cardiovascular: normal S1/S2 +, no rubs, no gallops Respiratory: CTA bilaterally, no wheezing, no rhonchi Abdominal: Soft, NT, ND, bowel sounds + Extremities: trace bilateral LE dependent edema, no cyanosis   The results of significant diagnostics from this hospitalization  (including imaging, microbiology, ancillary and laboratory) are listed below for reference.     Microbiology: No results found for this or any previous visit (from the past 240 hour(s)).   Labs: BNP (last 3 results) Recent Labs    02/25/22 2028  BNP 235.3*   Basic Metabolic Panel: Recent Labs  Lab 02/25/22 2028 02/26/22 0433 02/27/22 0424 02/28/22 0437 03/01/22 0459  NA 137 139 140 138 136  K 3.8 3.5 3.9 4.2 4.1  CL 99 97* 95* 93* 94*  CO2 33* 34* 33* 34* 33*  GLUCOSE 165* 98 175* 203* 228*  BUN 25* 22 23 24* 23  CREATININE 1.21 0.96 0.97 0.92 0.84  CALCIUM 8.4* 8.3* 8.4* 8.5* 8.2*  MG  --  2.4 2.5*  --  2.5*  PHOS  --  4.3  --   --   --    Liver Function Tests: Recent Labs  Lab 02/26/22 0433  AST 36  ALT 25  ALKPHOS 59  BILITOT 1.1  PROT 6.6  ALBUMIN 3.4*   No results for input(s): "LIPASE", "AMYLASE" in the last 168 hours. No results for input(s): "AMMONIA" in the last 168 hours. CBC: Recent Labs  Lab 02/25/22 2028 02/26/22 0433  WBC 9.6 8.2  HGB 16.7 17.0  HCT 53.0* 53.8*  MCV 99.8 98.9  PLT 135* 129*   Cardiac Enzymes: No results for input(s): "CKTOTAL", "CKMB", "CKMBINDEX", "TROPONINI" in the last 168 hours. BNP: Invalid input(s): "POCBNP" CBG: Recent Labs  Lab 02/28/22 1643 02/28/22 2129 03/01/22 0256 03/01/22 0730 03/01/22 1114  GLUCAP 259* 361* 264* 230* 319*   D-Dimer No results for input(s): "DDIMER" in the last 72 hours. Hgb A1c No results for input(s): "HGBA1C" in the last 72 hours. Lipid Profile Recent Labs    02/27/22 0424  CHOL 140  HDL 59  LDLCALC 73  TRIG 38  CHOLHDL 2.4   Thyroid function studies No results for input(s): "TSH", "T4TOTAL", "T3FREE", "THYROIDAB" in the last 72 hours.  Invalid input(s): "FREET3" Anemia work up No results for input(s): "VITAMINB12", "FOLATE", "FERRITIN", "TIBC", "IRON", "RETICCTPCT" in the last 72 hours. Urinalysis    Component Value Date/Time   COLORURINE YELLOW 06/14/2013  [place] 06/14/2013 1545   LABSPEC 1.015 06/14/2013 1545   PHURINE 7.0 06/14/2013 1545   GLUCOSEU NEGATIVE 06/14/2013 1545   HGBUR NEGATIVE 06/14/2013 1545   BILIRUBINUR NEGATIVE 06/14/2013 1545   KETONESUR NEGATIVE 06/14/2013 1545   PROTEINUR TRACE (A) 06/14/2013 1545   UROBILINOGEN 0.2 06/14/2013 1545   NITRITE NEGATIVE 06/14/2013 1545   LEUKOCYTESUR NEGATIVE 06/14/2013 1545   Sepsis Labs Recent Labs  Lab 02/25/22 2028 02/26/22 0433  WBC 9.6 8.2   Microbiology No results found for this or any previous visit (from the past 240 hour(s)).  Time coordinating discharge: 42 mins   SIGNED:  Irwin Brakeman, MD  [facility] 03/01/2022, 1:40 PM How to contact the [facility] Attending or Consulting provider [place] or covering provider during after hours [place], for this patient?  Check the care team in [facility] and look for a) attending/consulting [facility] provider listed and b) the [facility] team listed Log into [email or web address] and use [place]'s universal password to access. If you do not have the password, please contact the hospital operator. Locate the [facility] provider you are looking for under [facility] and page to a number that you can be directly reached. If you still have difficulty reaching the provider, please page the [facility] (Director on Call) for the Hospitalists listed on amion for assistance.

## 2022-03-01 NOTE — Progress Notes (Signed)
   Progress Note  Patient Name: Steven Henry Date of Encounter: 03/01/2022  Primary Cardiologist: Candee Furbish, MD  Follow-up ReDs vest at 36% today, down from 41% initially.  With clinical improvement anticipate discharge home today.  He did receive dose of IV Lasix this morning.  Continue Eliquis and Lipitor.  Would discharge home on Lasix 60 mg twice daily along with KCl 10 mEq daily.  Does not require AV nodal blockers for heart rate control of atrial fibrillation at this time.  Outpatient Cozaar has been held due to recent blood pressures, this can be revisited at outpatient follow-up.  Please schedule office visit with Dr. Marlou Porch or APP in the [place] office over the next 4 to 6 weeks.  Signed, Rozann Lesches, MD  03/01/2022, 1:15 PM

## 2022-03-01 NOTE — TOC Transition Note (Signed)
Transition of Care [facility]) - CM/SW Discharge Note   Patient Details  Name: Steven Henry MRN: [id number] Date of Birth: 1951-04-08  Transition of [facility]) CM/SW Contact:  Boneta Lucks, RN Phone Number: 03/01/2022, 4:06 PM   Clinical Narrative: Patient discharging home, MD ordered Neb machine. Patient is active with [facility]. TOC referred to [facility], he will drop ship. TOC asking if patient has other needs. His family shipped him a package, he is questioning how he will get that, now that he is discharging. TOC contacted Butch Penny in Pharmacist, hospital, she will watch for the package and call the patient.  [facility] is also checking to see if they can assist with updated patients CPAP machine.      Final next level of care: Home/Self Care Barriers to Discharge: Barriers Resolved   Patient Goals and CMS Choice Patient states their goals for this hospitalization and ongoing recovery are:: to go home. CMS Medicare.gov Compare Post Acute Care list provided to:: Patient    Discharge Placement         Patient and family notified of of transfer: 03/01/22  Discharge Plan and Services              DME Arranged: Nebulizer machine   Date DME Agency Contacted: 03/01/22 Time DME Agency Contacted: [phone] Representative spoke with at DME Agency: Leatrice Jewels

## 2022-03-01 NOTE — Progress Notes (Signed)
Progress Note  Patient Name: Steven Henry Date of Encounter: 03/01/2022  Primary Cardiologist: Candee Furbish, MD  Subjective   Sitting in bedside chair.  Generally feels better with diuresis.  Not short of breath at rest.  No chest pain.  Inpatient Medications    Scheduled Meds:  apixaban  5 mg Oral BID   arformoterol  15 mcg Nebulization BID   atorvastatin  40 mg Oral Daily   budesonide (PULMICORT) nebulizer solution  0.5 mg Nebulization BID   furosemide  80 mg Intravenous BID   insulin aspart  0-15 Units Subcutaneous TID WC   insulin aspart  0-5 Units Subcutaneous QHS   insulin aspart  6 Units Subcutaneous TID WC   insulin glargine-yfgn  18 Units Subcutaneous QHS   ipratropium-albuterol  3 mL Nebulization TID   methylPREDNISolone (SOLU-MEDROL) injection  40 mg Intravenous Q12H   nicotine  21 mg Transdermal Daily   polyethylene glycol  17 g Oral Daily   senna  2 tablet Oral Daily    PRN Meds: acetaminophen **OR** acetaminophen, ipratropium-albuterol, ondansetron **OR** ondansetron (ZOFRAN) IV   Vital Signs    Vitals:   03/01/22 0500 03/01/22 0839 03/01/22 0841 03/01/22 0851  BP: (!) 148/89     Pulse: 87     Resp: 20     Temp: 97.6 F (36.4 C)     TempSrc:      SpO2: 94% 93% 98% 98%  Weight: (!) 149.1 kg     Height:        Intake/Output Summary (Last 24 hours) at 03/01/2022 0939 Last data filed at 03/01/2022 0500 Gross per 24 hour  Intake 1260 ml  Output 4000 ml  Net -2740 ml   Filed Weights   02/27/22 0514 02/28/22 0511 03/01/22 0500  Weight: (!) 151 kg (!) 151 kg (!) 149.1 kg    Telemetry    Atrial fibrillation.  Personally reviewed.  ECG    No ECG reviewed.  Physical Exam   GEN: No acute distress.   Neck: No JVD. Cardiac: RRR, no murmur, rub, or gallop.  Respiratory: Nonlabored. Clear to auscultation bilaterally. GI: Soft, nontender, bowel sounds present. MS: Improving leg edema.  Venous stasis.  Labs    Chemistry Recent Labs  Lab  02/26/22 0433 02/27/22 0424 02/28/22 0437 03/01/22 0459  NA 139 140 138 136  K 3.5 3.9 4.2 4.1  CL 97* 95* 93* 94*  CO2 34* 33* 34* 33*  GLUCOSE 98 175* 203* 228*  BUN 22 23 24* 23  CREATININE 0.96 0.97 0.92 0.84  CALCIUM 8.3* 8.4* 8.5* 8.2*  PROT 6.6  --   --   --   ALBUMIN 3.4*  --   --   --   AST 36  --   --   --   ALT 25  --   --   --   ALKPHOS 59  --   --   --   BILITOT 1.1  --   --   --   GFRNONAA >60 >60 >60 >60  ANIONGAP '8 12 11 9     '$ Hematology Recent Labs  Lab 02/25/22 2028 02/26/22 0433  WBC 9.6 8.2  RBC 5.31 5.44  HGB 16.7 17.0  HCT 53.0* 53.8*  MCV 99.8 98.9  MCH 31.5 31.3  MCHC 31.5 31.6  RDW 16.0* 16.0*  PLT 135* 129*    Cardiac Enzymes Recent Labs  Lab 02/25/22 2028 02/25/22 2222  TROPONINIHS 19* 19*    BNP Recent  Labs  Lab 02/25/22 2028  BNP 113.0*     Radiology    No results found.  Cardiac Studies   Echocardiogram 02/26/2022:  1. Left ventricular ejection fraction, by estimation, is 65 to 70%. The  left ventricle has normal function. The left ventricle has no regional  wall motion abnormalities. There is mild left ventricular hypertrophy.  Left ventricular diastolic parameters  are indeterminate.   2. Right ventricular systolic function is normal. The right ventricular  size is moderately enlarged. There is moderately elevated pulmonary artery  systolic pressure. The estimated right ventricular systolic pressure is  63.8 mmHg.   3. Left atrial size was mildly dilated.   4. Right atrial size was moderately dilated.   5. The mitral valve is normal in structure. No evidence of mitral valve  regurgitation. No evidence of mitral stenosis.   6. The aortic valve is calcified. There is moderate calcification of the  aortic valve. There is mild thickening of the aortic valve. Aortic valve  regurgitation is not visualized. Aortic valve sclerosis is present, with  no evidence of aortic valve  stenosis.   7. Aortic dilatation noted.  There is mild dilatation of the aortic root,  measuring 41 mm. There is mild dilatation of the ascending aorta,  measuring 41 mm.   8. The inferior vena cava is dilated in size with <50% respiratory  variability, suggesting right atrial pressure of 15 mmHg.   Assessment & Plan    1.  HFpEF, LVEF 65 to 70% by echocardiogram.  RV contraction normal although RVSP moderately elevated.  He has diuresed nearly 14 L on IV Lasix.  Presenting ReDs measurement was 41%.  Renal function stable with creatinine 0.84.  2.  Persistent atrial fibrillation, duration uncertain.  CHA2DS2-VASc score is 5 and he has been started on Eliquis for stroke prophylaxis.  Heart rate controlled at baseline in the absence of AV nodal blockers.  3.  OSA on CPAP.  4.  Mixed hyperlipidemia on Lipitor.  Recheck ReDs vest measurement today in comparison to June 6 measurement.  He has diuresed significantly and would like to ultimately transition to oral diuretics if possible for discharge.  True baseline dry weight is not certain.  Signed, Rozann Lesches, MD  03/01/2022, 9:39 AM

## 2022-03-02 DIAGNOSIS — J449 Chronic obstructive pulmonary disease, unspecified: Secondary | ICD-10-CM | POA: Diagnosis not present

## 2022-03-02 DIAGNOSIS — J9601 Acute respiratory failure with hypoxia: Secondary | ICD-10-CM | POA: Diagnosis not present

## 2022-03-20 ENCOUNTER — Ambulatory Visit (INDEPENDENT_AMBULATORY_CARE_PROVIDER_SITE_OTHER): Payer: Medicare Other | Admitting: Family Medicine

## 2022-03-20 VITALS — BP 112/68 | HR 87 | Temp 98.6°F | Ht 74.0 in | Wt 304.0 lb

## 2022-03-20 DIAGNOSIS — I4821 Permanent atrial fibrillation: Secondary | ICD-10-CM

## 2022-03-20 DIAGNOSIS — I5033 Acute on chronic diastolic (congestive) heart failure: Secondary | ICD-10-CM | POA: Diagnosis not present

## 2022-03-20 DIAGNOSIS — G4733 Obstructive sleep apnea (adult) (pediatric): Secondary | ICD-10-CM | POA: Diagnosis not present

## 2022-03-20 DIAGNOSIS — E1165 Type 2 diabetes mellitus with hyperglycemia: Secondary | ICD-10-CM | POA: Diagnosis not present

## 2022-03-20 DIAGNOSIS — J449 Chronic obstructive pulmonary disease, unspecified: Secondary | ICD-10-CM

## 2022-03-20 DIAGNOSIS — I10 Essential (primary) hypertension: Secondary | ICD-10-CM | POA: Diagnosis not present

## 2022-03-20 DIAGNOSIS — I5022 Chronic systolic (congestive) heart failure: Secondary | ICD-10-CM | POA: Diagnosis not present

## 2022-03-20 DIAGNOSIS — Z794 Long term (current) use of insulin: Secondary | ICD-10-CM

## 2022-03-20 MED ORDER — METOPROLOL SUCCINATE ER 25 MG PO TB24: 25.0000 mg | ORAL_TABLET | Freq: Every day | ORAL | 3 refills | Status: DC

## 2022-03-21 LAB — CBC WITH DIFFERENTIAL/PLATELET
Absolute Monocytes: 813 cells/uL (ref 200–950)
Basophils Absolute: 33 cells/uL (ref 0–200)
Basophils Relative: 0.4 %
Eosinophils Absolute: 216 cells/uL (ref 15–500)
Eosinophils Relative: 2.6 %
HCT: 54.7 % — ABNORMAL HIGH (ref 38.5–50.0)
Hemoglobin: 18.9 g/dL — ABNORMAL HIGH (ref 13.2–17.1)
Lymphs Abs: 1544 cells/uL (ref 850–3900)
MCH: 31.1 pg (ref 27.0–33.0)
MCHC: 34.6 g/dL (ref 32.0–36.0)
MCV: 90 fL (ref 80.0–100.0)
MPV: 13 fL — ABNORMAL HIGH (ref 7.5–12.5)
Monocytes Relative: 9.8 %
Neutro Abs: 5694 cells/uL (ref 1500–7800)
Neutrophils Relative %: 68.6 %
Platelets: 175 10*3/uL (ref 140–400)
RBC: 6.08 10*6/uL — ABNORMAL HIGH (ref 4.20–5.80)
RDW: 13.6 % (ref 11.0–15.0)
Total Lymphocyte: 18.6 %
WBC: 8.3 10*3/uL (ref 3.8–10.8)

## 2022-03-21 LAB — BASIC METABOLIC PANEL WITH GFR
BUN: 13 mg/dL (ref 7–25)
CO2: 27 mmol/L (ref 20–32)
Calcium: 9 mg/dL (ref 8.6–10.3)
Chloride: 98 mmol/L (ref 98–110)
Creat: 1.11 mg/dL (ref 0.70–1.28)
Glucose, Bld: 251 mg/dL — ABNORMAL HIGH (ref 65–99)
Potassium: 3.7 mmol/L (ref 3.5–5.3)
Sodium: 139 mmol/L (ref 135–146)
eGFR: 71 mL/min/{1.73_m2} (ref >=60)

## 2022-03-21 LAB — HEMOGLOBIN A1C
Hgb A1c MFr Bld: 9.2 % of total Hgb — ABNORMAL HIGH (ref <5.7)
Mean Plasma Glucose: 217 mg/dL
eAG (mmol/L): 12 mmol/L

## 2022-04-01 DIAGNOSIS — J9601 Acute respiratory failure with hypoxia: Secondary | ICD-10-CM | POA: Diagnosis not present

## 2022-04-01 DIAGNOSIS — J449 Chronic obstructive pulmonary disease, unspecified: Secondary | ICD-10-CM | POA: Diagnosis not present

## 2022-04-02 ENCOUNTER — Other Ambulatory Visit: Payer: Medicare Other

## 2022-04-02 DIAGNOSIS — E1165 Type 2 diabetes mellitus with hyperglycemia: Secondary | ICD-10-CM

## 2022-04-02 DIAGNOSIS — Z794 Long term (current) use of insulin: Secondary | ICD-10-CM | POA: Diagnosis not present

## 2022-04-03 LAB — HEMOGLOBIN A1C
Hgb A1c MFr Bld: 9.2 % of total Hgb — ABNORMAL HIGH (ref <5.7)
Mean Plasma Glucose: 217 mg/dL
eAG (mmol/L): 12 mmol/L

## 2022-04-03 LAB — EXTRA SPECIMEN

## 2022-04-12 ENCOUNTER — Telehealth: Payer: Self-pay

## 2022-04-12 NOTE — Telephone Encounter (Signed)
Pt's daughter called in stating that pt's referral for a sleep study had been received but there is still no order placed for the sleep study. Pt needs to have an order placed along with the referral. Please advise.  Cb#: [phone]

## 2022-04-13 ENCOUNTER — Other Ambulatory Visit: Payer: Self-pay

## 2022-04-13 DIAGNOSIS — G4733 Obstructive sleep apnea (adult) (pediatric): Secondary | ICD-10-CM

## 2022-04-17 ENCOUNTER — Ambulatory Visit: Payer: Medicare Other | Admitting: Family Medicine

## 2022-04-26 ENCOUNTER — Ambulatory Visit (INDEPENDENT_AMBULATORY_CARE_PROVIDER_SITE_OTHER): Payer: Medicare Other | Admitting: Family Medicine

## 2022-04-26 VITALS — BP 118/70 | HR 76 | Temp 98.6°F | Ht 74.0 in | Wt 300.0 lb

## 2022-04-26 DIAGNOSIS — I480 Paroxysmal atrial fibrillation: Secondary | ICD-10-CM | POA: Diagnosis not present

## 2022-04-26 DIAGNOSIS — E1165 Type 2 diabetes mellitus with hyperglycemia: Secondary | ICD-10-CM

## 2022-04-26 DIAGNOSIS — Z794 Long term (current) use of insulin: Secondary | ICD-10-CM | POA: Diagnosis not present

## 2022-04-26 NOTE — Progress Notes (Signed)
Subjective:    Patient ID: Steven Henry, male    DOB: 1951-04-16, 71 y.o.   MRN: [id number] Admit date: 02/25/2022 Discharge date: 03/01/2022   Admitted From:  Home  Disposition:  Home    Recommendations for Outpatient Follow-up:  Follow up with PCP in 1 weeks Follow up with [facility] [place] in 4-6 weeks Please obtain BMP/CBC in 1-2 weeks with PCP   Discharge Condition: Stable   CODE STATUS: Full  DIET: Heart healthy / carb modified, 2 gm sodium restricted    Brief Hospitalization Summary: Please see all hospital notes, images, labs for full details of the hospitalization. 71 year old male with a history of COPD, tobacco abuse, OSA on CPAP, hypertension, diabetes mellitus type 2, and obesity presenting with 1 week history of shortness of breath and lower extremity edema.  The patient was already having the above symptoms when he and his family went on a [place]journ [place].  During her surgery, the patient developed worsening shortness of breath and edema.  He went to the local hospital and [facility] where he was noted to have new onset atrial fibrillation with decompensated CHF.  Initial ABG at that time showed 7.3 7/62/52/35 on room air.  He was placed on BiPAP and diuresed.  He was subsequently weaned to 3 L nasal cannula.  He left AGAINST MEDICAL ADVICE to come back home to [facility]. The patient endorses lower extremity edema for some time with significant worsening over the past week.  He continues to smoke 1-1/2 packs/day.  He also complains of orthopnea type symptoms and increasing abdominal girth.  He denies any fevers, chills, headache, chest pain, abdominal pain, nausea, vomiting, diarrhea, hematochezia, melena. In the ED, the patient was afebrile hemodynamically stable with oxygen saturation 95-100% on 4 L.  WBC 9.6, hemoglobin 16.7, platelets 135,000.  Sodium 137, potassium 3.8, serum creatinine 1.21.   Hospital Course by Problem list     Assessment and Plan: * Acute on chronic diastolic CHF (congestive heart failure) (HCC) Echo EF 65-70%, no WMA, mod elevated PASP ReDS reading 6/6 = 41 Remains clinically fluid overloaded Continue lasix to 80 mg bid IV Daily weights Accurate I/Os-NEG  Appreciate cardiology   I/O last 3 completed shifts: In: 2100 [P.O.:2100] Out: 0488 [Urine:6650] Total I/O In: 240 [P.O.:240] Out: -        Intake/Output Summary (Last 24 hours) at 03/01/2022 1258 Last data filed at 03/01/2022 0900    Gross per 24 hour  Intake 1140 ml  Output 3000 ml  Net -1860 ml         Filed Weights    02/27/22 0514 02/28/22 0511 03/01/22 0500  Weight: (!) 151 kg (!) 151 kg (!) 149.1 kg      ReDs Vest: 36% 03/01/2022  Discussed with Dr. Domenic Polite, Can DC home today on lasix 60 mg BID with KCl 10 meq daily and outpatient follow up in 4-6 weeks.         Atrial fibrillation (HCC) Type unspecified Rate controlled presently TSH--1.990 EchoEF 65-70, no WMA CHADSVASC2 = 5 -apixaban started   Acute on chronic respiratory failure with hypoxia and hypercapnia (HCC) Chronically on 2L at hs Now on 4L, weaning down to baseline of 3L Due to COPD exacerbation and pulm edema Wean for saturation >90%   Thrombocytopenia (HCC) Likely due to chronic hepatic congestion -hep B surface antigen -hep C antibody -B12--689 -folate--18.6 -TSH--1.990   Mixed hyperlipidemia Continue statin   Type 2 diabetes  mellitus with hyperglycemia ([place]) Noncompliant with Lantus at home - advised to start using it again [facility] on hold in hospital but resume at DC  Holding metformin and glipizide in hospital - resume at DC  novolog sliding scale -09/15/21 A1C--10.1 -increase semglee to 24 units, added prandial coverage 8 units TIDAC.  CBG (last 3)       Recent Labs    03/01/22 0256 03/01/22 0730 03/01/22 1114  GLUCAP 264* 230* 319*    Resume home treatment at discharge    Tobacco use Tobacco cessation discussed    Essential hypertension Holding losartan to allow BP margin for diuresis Can revisit starting back on outpatient basis per cardiology   COPD with acute exacerbation ([place]) Continue pulmicort and brovana Continue duonebs He was treated with IV solumedrol and he is improving well and can discharge home today.    Obstructive sleep apnea Continue to offer CPAP   Morbid obesity (HCC) BMI 44.78 Lifestyle modification   Discharge Diagnoses:  Principal Problem:   Acute on chronic diastolic CHF (congestive heart failure) (HCC) Active Problems:   Morbid obesity ([place])   Obstructive sleep apnea   COPD with acute exacerbation (HCC)   Essential hypertension   Tobacco use   Type 2 diabetes mellitus with hyperglycemia (HCC)   Mixed hyperlipidemia   Thrombocytopenia (HCC)   Acute on chronic respiratory failure with hypoxia and hypercapnia (HCC)   Atrial fibrillation ([place])   03/20/22 Patient is here today for hospital discharge follow-up.  He denies any shortness of breath.  He has cut down from smoking 2 packs of cigarettes a day to three quarters of a pack a day.  He is working to try to quit smoking.  He states that his CPAP machine is no longer functioning.  However his insurance requires another sleep study to qualify him for another machine.  Therefore he is requesting referral to sleep this was.  He is still not checking her sugars.  However he states that his weight has improved dramatically.  He was over 330 pounds when he was hospitalized.  He is down to 304 pounds now.  He has trace bipedal edema.  Aside from wheezing, his lungs are clear to auscultation.  However he is still in atrial fibrillation however rate is controlled in the high 90s.  He denies any chest pain.  At that time, my plan was:  I explained to the patient the necessity of him using Eliquis twice daily indefinitely to prevent strokes.  Also explained to the patient the necessity of rate control to prevent RVR and fluid  overload.  Therefore I would like to start him on Toprol-XL 25 mg a day to help try to keep his heart rate in the 60s to 70s.  Monitor for any bronchospasms on the beta-blocker.  The patient appears somewhat euvolemic today.  I will monitor his renal function and potassium.  As long as his potassium and renal function are stable I will not change his dose of Lasix.  I will consult a sleep specialist to repeat a sleep study to ensure that his CPAP machine is functioning appropriately to help reduce the strain on his heart.  I will also consult cardiology as the patient is interested in rhythm control.  There was mild atrial dilatation on his echocardiogram.  Check a hemoglobin A1c today to monitor his glycemic control.  04/26/22 Patient has spontaneously converted back to normal sinus rhythm.  Today his heart rate is in the 60s and in sinus  rhythm.  There is no edema on physical exam.  In fact the patient appears slightly dehydrated.  I believe he is taking an excessive amount of Lasix.  He is currently on 60 mg twice daily.  He is increased his insulin to 25 units a day however he is not checking his sugars.  He denies any hypoglycemic episodes. Past Medical History:  Diagnosis Date   A-fib ([place])    CHF (congestive heart failure) (HCC)    CKD (chronic kidney disease), stage III (HCC)    Colon polyps    Diabetes mellitus type II, uncontrolled    Diabetes mellitus without complication ([place])    Hypertension    Morbid obesity ([place])    PVD (peripheral vascular disease) ([place])    right femoral artery   Sleep apnea    not on CPAP    Sleep apnea    Past Surgical History:  Procedure Laterality Date   COLONOSCOPY N/A 09/16/2015   Procedure: COLONOSCOPY;  Surgeon: Daneil Dolin, MD;  Location: [facility] SU[facility];  Service: Endoscopy;  Laterality: N/A;  8:15 AM - pt has c pap   ENDOVENOUS ABLATION SAPHENOUS VEIN W/ LASER Right 08/26/2017   endovenous laser ablation right greater saphenous vein by Tinnie Gens  MD    stab phlebectomy  Right 01/07/2018   stab phlebectomy 10-20 incisions right leg by Tinnie Gens MD    Current Outpatient Medications on File Prior to Visit  Medication Sig Dispense Refill   Accu-Chek FastClix Lancets MISC Check BS QAM - DX: E11.9 100 each 3   apixaban (ELIQUIS) 5 MG TABS tablet Take 1 tablet (5 mg total) by mouth 2 (two) times daily. 60 tablet 2   atorvastatin (LIPITOR) 40 MG tablet Take 1 tablet (40 mg total) by mouth daily. 90 tablet 3   Blood Glucose Monitoring Suppl (ONE TOUCH ULTRA 2) w/Device KIT USETO CHECK BLOOD SUGAR EVERY MORNING 1 kit 0   Cholecalciferol (VITAMIN D3) 10 MCG (400 UNIT) tablet Take 800 Units by mouth daily.     furosemide (LASIX) 20 MG tablet Take 3 tablets (60 mg total) by mouth 2 (two) times daily. 180 tablet 1   glipiZIDE (GLUCOTROL) 10 MG tablet TAKE 1 TABLET BY MOUTH EVERY DAY BEFORE breakfast (Patient taking differently: Take 10 mg by mouth daily before breakfast.) 90 tablet 3   GLYXAMBI 25-5 MG TABS TAKE 1 TABLET BY MOUTH DAILY (Patient taking differently: Take 1 tablet by mouth daily.) 90 tablet 2   insulin glargine (LANTUS) 100 UNIT/ML injection Inject 0.15 mLs (15 Units total) into the skin daily. PLEASE INCREASE INSULIN BY 1 UNIT DAILY UNTIL FBG IS <130, THEN HOLD AT THAT DOSE (Patient taking differently: Inject 25 Units into the skin daily. PLEASE INCREASE INSULIN BY 1 UNIT DAILY UNTIL FBG IS <130, THEN HOLD AT THAT DOSE) 10 mL 11   ipratropium-albuterol (DUONEB) 0.5-2.5 (3) MG/3ML SOLN Take 3 mLs by nebulization 3 (three) times daily. 360 mL 1   Lancets Misc. (ACCU-CHEK FASTCLIX LANCET) KIT Check BS QAM - DX: E11.9 1 kit 0   metFORMIN (GLUCOPHAGE) 1000 MG tablet TAKE 1 TABLET BY MOUTH TWICE DAILY WITH a meal (Patient taking differently: Take 1,000 mg by mouth 2 (two) times daily with a meal.) 180 tablet 3   metoprolol succinate (TOPROL-XL) 25 MG 24 hr tablet Take 1 tablet (25 mg total) by mouth daily. 90 tablet 3   Multiple Vitamin  (MULTIVITAMIN) capsule Take 1 capsule by mouth daily. Mens one a day  Multiple Vitamins-Minerals (ZINC PO) Take 1 tablet by mouth daily.     ONETOUCH ULTRA test strip USE TO CHECK BLOOD SUGAR EVERY MORNING 100 strip 12   potassium chloride (KLOR-CON M) 10 MEQ tablet Take 1 tablet (10 mEq total) by mouth daily. 30 tablet 1   No current facility-administered medications on file prior to visit.   No Known Allergies Social History   Socioeconomic History   Marital status: Divorced    Spouse name: Not on file   Number of children: 2   Years of education: Not on file   Highest education level: Not on file  Occupational History   Occupation: Self Employed  Tobacco Use   Smoking status: Every Day    Packs/day: 0.50    Years: 30.00    Total pack years: 15.00    Types: Cigarettes   Smokeless tobacco: Never  Vaping Use   Vaping Use: Never used  Substance and Sexual Activity   Alcohol use: Yes    Comment: occasional   Drug use: No   Sexual activity: Not on file    Comment: divorced, 2 daughters.  works Psychologist, occupational for fairs, Social research officer, government.  Other Topics Concern   Not on file  Social History Narrative   Not on file   Social Determinants of Health   Financial Resource Strain: Not on file  Food Insecurity: Not on file  Transportation Needs: Not on file  Physical Activity: Not on file  Stress: Not on file  Social Connections: Not on file  Intimate Partner Violence: Not on file     Review of Systems  All other systems reviewed and are negative.      Objective:   Physical Exam Vitals reviewed.  Constitutional:      General: He is not in acute distress.    Appearance: Normal appearance. He is obese. He is not ill-appearing or toxic-appearing.  HENT:     Head: Normocephalic and atraumatic.     Right Ear: Tympanic membrane and ear canal normal.     Left Ear: Tympanic membrane and ear canal normal.     Nose: Nose normal. No congestion or rhinorrhea.     Mouth/Throat:      Mouth: Mucous membranes are moist.     Pharynx: Oropharynx is clear. No oropharyngeal exudate or posterior oropharyngeal erythema.  Eyes:     General: No scleral icterus.       Right eye: No discharge.        Left eye: No discharge.     Extraocular Movements: Extraocular movements intact.     Conjunctiva/sclera: Conjunctivae normal.     Pupils: Pupils are equal, round, and reactive to light.  Neck:     Vascular: No carotid bruit.  Cardiovascular:     Rate and Rhythm: Normal rate and regular rhythm.     Pulses: Normal pulses.     Heart sounds: Normal heart sounds. No murmur heard.    No friction rub. No gallop.  Pulmonary:     Effort: Pulmonary effort is normal. No respiratory distress.     Breath sounds: No stridor. Wheezing and rhonchi present. No rales.  Chest:     Chest wall: No tenderness.  Abdominal:     General: Abdomen is flat. Bowel sounds are normal. There is no distension.     Palpations: Abdomen is soft. There is no mass.     Tenderness: There is no abdominal tenderness. There is no guarding or rebound.     Hernia: No  hernia is present.  Musculoskeletal:     Cervical back: Normal range of motion and neck supple.     Right lower leg: No edema.     Left lower leg: No edema.  Lymphadenopathy:     Cervical: No cervical adenopathy.  Skin:    Coloration: Skin is not jaundiced.     Findings: No bruising, erythema, lesion or rash.  Neurological:     General: No focal deficit present.     Mental Status: He is alert and oriented to person, place, and time.     Cranial Nerves: No cranial nerve deficit.     Sensory: No sensory deficit.     Motor: No weakness.     Coordination: Coordination normal.     Gait: Gait normal.     Deep Tendon Reflexes: Reflexes normal.  Psychiatric:        Mood and Affect: Mood normal.        Behavior: Behavior normal.        Thought Content: Thought content normal.        Judgment: Judgment normal.               Assessment & Pl   Type 2 diabetes mellitus with hyperglycemia, with long-term current use of insulin (HCC) - Plan: Hemoglobin R4Y, BASIC METABOLIC PANEL WITH GFR  Paroxysmal atrial fibrillation ([place]) Patient has spontaneously converted back to normal sinus rhythm.  He appears slightly dehydrated.  Decrease Lasix to 40 mg twice daily.  Check a BMP to monitor potassium and renal function.  Check an A1c.  Of asked the patient to weigh himself every day and if his weight goes up more than 2 to 3 pounds in a day we need to increase his Lasix back to 60 mg twice daily but I suspect that he does not need as much diuretic now that he is in normal sinus rhythm.  I also asked him to check his fasting blood sugars.  Ideally I like his fasting blood sugar to be less than 130.  If not we need to continue to uptitrate his insulin accordingly

## 2022-04-27 LAB — BASIC METABOLIC PANEL WITH GFR
BUN: 17 mg/dL (ref 7–25)
CO2: 28 mmol/L (ref 20–32)
Calcium: 9.7 mg/dL (ref 8.6–10.3)
Chloride: 98 mmol/L (ref 98–110)
Creat: 0.98 mg/dL (ref 0.70–1.28)
Glucose, Bld: 84 mg/dL (ref 65–99)
Potassium: 3.7 mmol/L (ref 3.5–5.3)
Sodium: 141 mmol/L (ref 135–146)
eGFR: 82 mL/min/{1.73_m2} (ref >=60)

## 2022-04-27 LAB — HEMOGLOBIN A1C
Hgb A1c MFr Bld: 9 % of total Hgb — ABNORMAL HIGH (ref <5.7)
Mean Plasma Glucose: 212 mg/dL
eAG (mmol/L): 11.7 mmol/L

## 2022-05-02 DIAGNOSIS — J449 Chronic obstructive pulmonary disease, unspecified: Secondary | ICD-10-CM | POA: Diagnosis not present

## 2022-05-02 DIAGNOSIS — J9601 Acute respiratory failure with hypoxia: Secondary | ICD-10-CM | POA: Diagnosis not present

## 2022-05-07 ENCOUNTER — Other Ambulatory Visit: Payer: Self-pay | Admitting: Family Medicine

## 2022-05-07 ENCOUNTER — Encounter: Payer: Self-pay | Admitting: Family Medicine

## 2022-05-10 ENCOUNTER — Other Ambulatory Visit: Payer: Self-pay

## 2022-05-10 DIAGNOSIS — I10 Essential (primary) hypertension: Secondary | ICD-10-CM

## 2022-05-10 DIAGNOSIS — N183 Chronic kidney disease, stage 3 unspecified: Secondary | ICD-10-CM

## 2022-05-10 DIAGNOSIS — I480 Paroxysmal atrial fibrillation: Secondary | ICD-10-CM

## 2022-05-10 MED ORDER — POTASSIUM CHLORIDE CRYS ER 10 MEQ PO TBCR: 10.0000 meq | EXTENDED_RELEASE_TABLET | Freq: Every day | ORAL | 1 refills | Status: DC

## 2022-05-15 ENCOUNTER — Ambulatory Visit: Payer: Medicare Other | Attending: Family Medicine | Admitting: Neurology

## 2022-05-15 DIAGNOSIS — R0683 Snoring: Secondary | ICD-10-CM | POA: Diagnosis not present

## 2022-05-15 DIAGNOSIS — G4733 Obstructive sleep apnea (adult) (pediatric): Secondary | ICD-10-CM | POA: Insufficient documentation

## 2022-05-15 NOTE — Progress Notes (Signed)
1 

## 2022-05-23 ENCOUNTER — Other Ambulatory Visit: Payer: Self-pay | Admitting: Family Medicine

## 2022-05-23 NOTE — Telephone Encounter (Signed)
Requested medications are due for refill today.  yes  Requested medications are on the active medications list.  yes  Last refill. 03/01/2022 #60 2 refills  Future visit scheduled.   no  Notes to clinic.  Rx signed by [employer]    Requested Prescriptions  Pending Prescriptions Disp Refills   ELIQUIS 5 MG TABS tablet [Pharmacy Med Name: Eliquis 5 mg tablet] 60 tablet 5    Sig: TAKE 1 TABLET BY MOUTH TWICE DAILY     Hematology:  Anticoagulants - apixaban Failed - 05/23/2022  1:08 PM      Failed - HGB in normal range and within 360 days    Hemoglobin  Date Value Ref Range Status  03/20/2022 18.9 (H) 13.2 - 17.1 g/dL Final         Failed - HCT in normal range and within 360 days    HCT  Date Value Ref Range Status  03/20/2022 54.7 (H) 38.5 - 50.0 % Final         Passed - PLT in normal range and within 360 days    Platelets  Date Value Ref Range Status  03/20/2022 175 140 - 400 Thousand/uL Final         Passed - Cr in normal range and within 360 days    Creat  Date Value Ref Range Status  04/26/2022 0.98 0.70 - 1.28 mg/dL Final         Passed - AST in normal range and within 360 days    AST  Date Value Ref Range Status  02/26/2022 36 15 - 41 U/L Final         Passed - ALT in normal range and within 360 days    ALT  Date Value Ref Range Status  02/26/2022 25 0 - 44 U/L Final         Passed - Valid encounter within last 12 months    Recent Outpatient Visits           4 months ago Acute pain of left knee   [place] [name], MD   6 months ago Rib pain on left side   [place] [facility] Schaumann, Cammie Mcgee, MD   7 months ago Rib pain on left side   [place] Eulogio Bear, NP   8 months ago Uncontrolled type 2 diabetes mellitus with hyperosmolarity without coma, without long-term current use of insulin ([place])   [place] Pickard, Cammie Mcgee, MD   1 year ago Acute pulmonary edema  ([place])   [place] Pickard, Cammie Mcgee, MD       Future Appointments             In 3 weeks Fay Records, MD [facility] [place]. [place], [place]

## 2022-05-25 NOTE — Procedures (Signed)
Bergen A. Merlene Laughter, MD     [email or web address]             NOCTURNAL POLYSOMNOGRAPHY   LOCATION: [facility]  Patient Name: Steven Henry, Steven Henry Date: 05/15/2022 Gender: Male D.O.B: 26-Oct-1950 Age (years): 22 Referring Provider: Cletus Gash T. Pickard Height (inches): 73 Interpreting Physician: Phillips Odor MD, ABSM Weight (lbs): 300 RPSGT: Rosebud Poles BMI: 40 MRN: [id number] Neck Size: 20.00 CLINICAL INFORMATION Sleep Study Type: NPSG     Indication for sleep study: N/A     Epworth Sleepiness Score:     SLEEP STUDY TECHNIQUE As per the AASM Manual for the Scoring of Sleep and Associated Events v2.3 (April 2016) with a hypopnea requiring 4% desaturations.  The channels recorded and monitored were frontal, central and occipital EEG, electrooculogram (EOG), submentalis EMG (chin), nasal and oral airflow, thoracic and abdominal wall motion, anterior tibialis EMG, snore microphone, electrocardiogram, and pulse oximetry.  MEDICATIONS Medications self-administered by patient taken the night of the study : N/A  Current Outpatient Medications:    Accu-Chek FastClix Lancets MISC, Check BS QAM - DX: E11.9, Disp: 100 each, Rfl: 3   apixaban (ELIQUIS) 5 MG TABS tablet, Take 1 tablet (5 mg total) by mouth 2 (two) times daily., Disp: 60 tablet, Rfl: 2   atorvastatin (LIPITOR) 40 MG tablet, Take 1 tablet (40 mg total) by mouth daily., Disp: 90 tablet, Rfl: 3   Blood Glucose Monitoring Suppl (ONE TOUCH ULTRA 2) w/Device KIT, USETO CHECK BLOOD SUGAR EVERY MORNING, Disp: 1 kit, Rfl: 0   Cholecalciferol (VITAMIN D3) 10 MCG (400 UNIT) tablet, Take 800 Units by mouth daily., Disp: , Rfl:    furosemide (LASIX) 40 MG tablet, Take 1 tablet (40 mg total) by mouth 2 (two) times daily., Disp: 60 tablet, Rfl: 3   glipiZIDE (GLUCOTROL) 10 MG tablet, TAKE 1 TABLET BY MOUTH EVERY DAY BEFORE breakfast (Patient taking differently: Take 10 mg by mouth daily before breakfast.),  Disp: 90 tablet, Rfl: 3   GLYXAMBI 25-5 MG TABS, TAKE 1 TABLET BY MOUTH DAILY, Disp: 90 tablet, Rfl: 2   insulin glargine (LANTUS) 100 UNIT/ML injection, Inject 0.15 mLs (15 Units total) into the skin daily. PLEASE INCREASE INSULIN BY 1 UNIT DAILY UNTIL FBG IS <130, THEN HOLD AT THAT DOSE (Patient taking differently: Inject 25 Units into the skin daily. PLEASE INCREASE INSULIN BY 1 UNIT DAILY UNTIL FBG IS <130, THEN HOLD AT THAT DOSE), Disp: 10 mL, Rfl: 11   ipratropium-albuterol (DUONEB) 0.5-2.5 (3) MG/3ML SOLN, Take 3 mLs by nebulization 3 (three) times daily., Disp: 360 mL, Rfl: 1   Lancets Misc. (ACCU-CHEK FASTCLIX LANCET) KIT, Check BS QAM - DX: E11.9, Disp: 1 kit, Rfl: 0   metFORMIN (GLUCOPHAGE) 1000 MG tablet, TAKE 1 TABLET BY MOUTH TWICE DAILY WITH a meal (Patient taking differently: Take 1,000 mg by mouth 2 (two) times daily with a meal.), Disp: 180 tablet, Rfl: 3   metoprolol succinate (TOPROL-XL) 25 MG 24 hr tablet, Take 1 tablet (25 mg total) by mouth daily., Disp: 90 tablet, Rfl: 3   Multiple Vitamin (MULTIVITAMIN) capsule, Take 1 capsule by mouth daily. Mens one a day, Disp: , Rfl:    Multiple Vitamins-Minerals (ZINC PO), Take 1 tablet by mouth daily., Disp: , Rfl:    ONETOUCH ULTRA test strip, USE TO CHECK BLOOD SUGAR EVERY MORNING, Disp: 100 strip, Rfl: 12   potassium chloride (KLOR-CON M) 10 MEQ tablet, Take 1 tablet (10 mEq total) by mouth daily., Disp: 30 tablet, Rfl: 1  SLEEP ARCHITECTURE The study was initiated at 10:17:57 PM and ended at 4:57:17 AM.  Sleep onset time was 9.3 minutes and the sleep efficiency was 90.5%. The total sleep time was 361.5 minutes.  Stage REM latency was N/A minutes.  The patient spent 22.41% of the night in stage N1 sleep, 77.59% in stage N2 sleep, 0.00% in stage N3 and 0% in REM.  Alpha intrusion was absent.  Supine sleep was 58.09%.  RESPIRATORY PARAMETERS The overall apnea/hypopnea index (AHI) was 69.9 per hour. There were 31 total  apneas, including 28 obstructive, 3 central and 0 mixed apneas. There were 390 hypopneas and 0 RERAs.  The AHI during Stage REM sleep was N/A per hour.  AHI while supine was 73.7 per hour.  The mean oxygen saturation was 88.85%. The minimum SpO2 during sleep was 78.00%.  moderate snoring was noted during this study.  CARDIAC DATA The 2 lead EKG demonstrated sinus rhythm. The mean heart rate was 41.91 beats per minute. Other EKG findings include: Atrial Fibrillation, PVCs. LEG MOVEMENT DATA The total PLMS were 0 with a resulting PLMS index of 0.00. Associated arousal with leg movement index was 0.0.  IMPRESSIONS  Severe obstructive sleep apnea syndrome is documented with this recording ( AHI 70 oxygen nadir 78%). Auto PAP 10-20 is recommended.  Abnormal sleep architecture is also observed with absent slow-wave sleep and absent REM sleep.   Delano Metz, MD Diplomate, American Board of Sleep Medicine.  ELECTRONICALLY SIGNED ON:  05/25/2022, 6:00 PM [place] PH: [phone]   FX: [phone] [place]

## 2022-06-01 ENCOUNTER — Other Ambulatory Visit: Payer: Self-pay | Admitting: Family Medicine

## 2022-06-02 DIAGNOSIS — J449 Chronic obstructive pulmonary disease, unspecified: Secondary | ICD-10-CM | POA: Diagnosis not present

## 2022-06-02 DIAGNOSIS — J9601 Acute respiratory failure with hypoxia: Secondary | ICD-10-CM | POA: Diagnosis not present

## 2022-06-11 ENCOUNTER — Telehealth: Payer: Self-pay | Admitting: Family Medicine

## 2022-06-11 ENCOUNTER — Other Ambulatory Visit: Payer: Self-pay

## 2022-06-11 DIAGNOSIS — D696 Thrombocytopenia, unspecified: Secondary | ICD-10-CM

## 2022-06-11 DIAGNOSIS — I480 Paroxysmal atrial fibrillation: Secondary | ICD-10-CM

## 2022-06-11 MED ORDER — APIXABAN 5 MG PO TABS: 5.0000 mg | ORAL_TABLET | Freq: Two times a day (BID) | ORAL | 3 refills | Status: DC

## 2022-06-11 NOTE — Telephone Encounter (Signed)
Received call from patient's daughter Gregary Signs to get the status of patient's bipap/autopap order.   Also wants to know if patient can go through insurance to get a [employer].   She has a question about an Rx denial for   apixaban (ELIQUIS) 5 MG TABS tablet [[id number]]   Pharmacy:   [facility] [place] [address]  [address], [place] [address]  Phone:  [phone]  Fax:  [phone]   LOV: 04/26/2022  Please advise at [phone].

## 2022-06-14 ENCOUNTER — Other Ambulatory Visit: Payer: Self-pay

## 2022-06-14 DIAGNOSIS — N183 Chronic kidney disease, stage 3 unspecified: Secondary | ICD-10-CM

## 2022-06-14 DIAGNOSIS — Z794 Long term (current) use of insulin: Secondary | ICD-10-CM

## 2022-06-14 DIAGNOSIS — E1100 Type 2 diabetes mellitus with hyperosmolarity without nonketotic hyperglycemic-hyperosmolar coma (NKHHC): Secondary | ICD-10-CM

## 2022-06-14 MED ORDER — FREESTYLE LIBRE 2 READER DEVI: 1 refills | Status: AC

## 2022-06-14 MED ORDER — FREESTYLE LIBRE 3 SENSOR MISC: 3 refills | Status: DC

## 2022-06-15 ENCOUNTER — Encounter: Payer: Self-pay | Admitting: Internal Medicine

## 2022-06-15 ENCOUNTER — Ambulatory Visit: Payer: Medicare Other | Attending: Internal Medicine | Admitting: Internal Medicine

## 2022-06-15 VITALS — BP 100/60 | HR 71 | Ht 73.0 in | Wt 296.4 lb

## 2022-06-15 DIAGNOSIS — I480 Paroxysmal atrial fibrillation: Secondary | ICD-10-CM | POA: Diagnosis not present

## 2022-06-15 NOTE — Progress Notes (Unsigned)
Cardiology Office Note   Date:  06/15/2022   ID:  Steven Henry, DOB 05/02/1951, MRN [id number]  PCP:  [name], MD  Cardiologist:   [name], MD   Patient presents for evaluation of atrial fibrillaton      History of Present Illness: Steven Henry is a 71 y.o. male with a history of COPD, OSA (on CPAP), PVOD, HTN, T2DM, tobabacco abuse.   The pt is  followed by Dr Dennard Schaumann    The pt was  in [facility] to local hospital  because of SOB and CHF  Found to in afib.   He was diuresed then left AMA    He presented to [facility] in June with orthopnea and inceased abdominal girth    ReDS vst was 41   The was treated with IV lasix   Echo done   LVEF 65 to 70%   Treated with lasix and rate control  Sent home on lasix and Eliquis  The pt says he is feeling OK  He deneis CP    No palpitaions   Breathing is stable    He says he was seen by Dr Dennard Schaumann in Aug, told him he was not in afib     Diet Breakfast:  Eggs , grits, baccon   Water Lunch:   [employer]iner    Chicken   Green beans, Onion and squasch   Rice Water    Occasional coke            Current Meds  Medication Sig   Accu-Chek FastClix Lancets MISC Check BS QAM - DX: E11.9   apixaban (ELIQUIS) 5 MG TABS tablet Take 1 tablet (5 mg total) by mouth 2 (two) times daily.   atorvastatin (LIPITOR) 40 MG tablet Take 1 tablet (40 mg total) by mouth daily.   Blood Glucose Monitoring Suppl (ONE TOUCH ULTRA 2) w/Device KIT USETO CHECK BLOOD SUGAR EVERY MORNING   Cholecalciferol (VITAMIN D3) 10 MCG (400 UNIT) tablet Take 800 Units by mouth daily.   Continuous Blood Gluc Receiver (FREESTYLE LIBRE 2 READER) DEVI For continuous blood glucose monitoring   Continuous Blood Gluc Sensor (FREESTYLE LIBRE 3 SENSOR) MISC Place 1 sensor on the skin every 14 days. Use to check glucose continuously   furosemide (LASIX) 40 MG tablet Take 1 tablet (40 mg total) by mouth 2 (two) times daily.   glipiZIDE (GLUCOTROL) 10 MG tablet TAKE 1  TABLET BY MOUTH EVERY DAY BEFORE breakfast (Patient taking differently: Take 10 mg by mouth daily before breakfast.)   GLYXAMBI 25-5 MG TABS TAKE 1 TABLET BY MOUTH DAILY   insulin glargine (LANTUS) 100 UNIT/ML injection Inject 0.15 mLs (15 Units total) into the skin daily. PLEASE INCREASE INSULIN BY 1 UNIT DAILY UNTIL FBG IS <130, THEN HOLD AT THAT DOSE (Patient taking differently: Inject 25 Units into the skin daily. PLEASE INCREASE INSULIN BY 1 UNIT DAILY UNTIL FBG IS <130, THEN HOLD AT THAT DOSE)   ipratropium-albuterol (DUONEB) 0.5-2.5 (3) MG/3ML SOLN Take 3 mLs by nebulization 3 (three) times daily.   Lancets Misc. (ACCU-CHEK FASTCLIX LANCET) KIT Check BS QAM - DX: E11.9   metFORMIN (GLUCOPHAGE) 1000 MG tablet TAKE 1 TABLET BY MOUTH TWICE DAILY WITH a meal (Patient taking differently: Take 1,000 mg by mouth 2 (two) times daily with a meal.)   metoprolol succinate (TOPROL-XL) 25 MG 24 hr tablet Take 1 tablet (25 mg total) by mouth daily.   Multiple Vitamin (MULTIVITAMIN) capsule Take  1 capsule by mouth daily. Mens one a day   Multiple Vitamins-Minerals (ZINC PO) Take 1 tablet by mouth daily.   ONETOUCH ULTRA test strip USE TO CHECK BLOOD SUGAR EVERY MORNING   potassium chloride (KLOR-CON M) 10 MEQ tablet Take 1 tablet (10 mEq total) by mouth daily.     Allergies:   Patient has no known allergies.   Past Medical History:  Diagnosis Date   A-fib ([place])    CHF (congestive heart failure) (HCC)    CKD (chronic kidney disease), stage III (HCC)    Colon polyps    Diabetes mellitus type II, uncontrolled    Diabetes mellitus without complication ([place])    Hypertension    Morbid obesity ([place])    PVD (peripheral vascular disease) ([place])    right femoral artery   Sleep apnea    not on CPAP    Sleep apnea     Past Surgical History:  Procedure Laterality Date   COLONOSCOPY N/A 09/16/2015   Procedure: COLONOSCOPY;  Surgeon: Daneil Dolin, MD;  Location: [facility];  Service: Endoscopy;   Laterality: N/A;  8:15 AM - pt has c pap   ENDOVENOUS ABLATION SAPHENOUS VEIN W/ LASER Right 08/26/2017   endovenous laser ablation right greater saphenous vein by Tinnie Gens MD    stab phlebectomy  Right 01/07/2018   stab phlebectomy 10-20 incisions right leg by Tinnie Gens MD      Social History:  The patient  reports that he has been smoking cigarettes. He has a 30.00 pack-year smoking history. He has never used smokeless tobacco. He reports current alcohol use. He reports that he does not use drugs.   Family History:  The patient's family history includes Colon cancer in his mother; Diabetes in his mother, sister, sister, and sister; Heart disease in his mother.    ROS:  Please see the history of present illness. All other systems are reviewed and  Negative to the above problem except as noted.    PHYSICAL EXAM: VS:  BP 100/60   Pulse 71   Ht '6\' 1"'  (1.854 m)   Wt 296 lb 6.4 oz (134.4 kg)   SpO2 91%   BMI 39.11 kg/m   GEN: Markedly obese 71 yo in no acute distress  HEENT: normal  Neck: no JVD, carotid bruits Cardiac:   Irreg irreg   No murmurs  Ext  Tr LE edema  Respiratory:  Mild rhonchi GI: soft, nontender, obese, + BS  No hepatomegaly  MS: no deformity Moving all extremities   Skin: warm and dry, no rash Neuro:  Strength and sensation are intact Psych: euthymic mood, full affect   EKG:  EKG is ordered today.  Atrial fib  52 bpm   RBBB  Septal MI    Echo   02/2022 eft ventricular ejection fraction, by estimation, is 65 to 70%. The left ventricle has normal function. The left ventricle has no regional wall motion abnormalities. There is mild left ventricular hypertrophy. Left ventricular diastolic parameters are indeterminate. 1. Right ventricular systolic function is normal. The right ventricular size is moderately enlarged. There is moderately elevated pulmonary artery systolic pressure. The estimated right ventricular systolic pressure is 16.1 mmHg. 2. 3. Left  atrial size was mildly dilated. 4. Right atrial size was moderately dilated. The mitral valve is normal in structure. No evidence of mitral valve regurgitation. No evidence of mitral stenosis. 5. The aortic valve is calcified. There is moderate calcification of the aortic valve. There  is mild thickening of the aortic valve. Aortic valve regurgitation is not visualized. Aortic valve sclerosis is present, with no evidence of aortic valve stenosis. 6. Aortic dilatation noted. There is mild dilatation of the aortic root, measuring 41 mm. There is mild dilatation of the ascending aorta, measuring 41 mm. 7. The inferior vena cava is dilated in size with <50% respiratory variability, suggesting right atrial pressure of 15 mmHg.  Lipid Panel    Component Value Date/Time   CHOL 140 02/27/2022 0424   TRIG 38 02/27/2022 0424   HDL 59 02/27/2022 0424   CHOLHDL 2.4 02/27/2022 0424   VLDL 8 02/27/2022 0424   LDLCALC 73 02/27/2022 0424   LDLCALC 119 (H) 09/15/2021 0825      Wt Readings from Last 3 Encounters:  06/15/22 296 lb 6.4 oz (134.4 kg)  04/26/22 300 lb (136.1 kg)  03/20/22 (!) 304 lb (137.9 kg)      ASSESSMENT AND PLAN:  1  Atrial fibrllation.  CHADSVASC score is 5  Pt diagnosed earlier this year   He has been rate controlled    I am not sure how symptomatic he is but it is reasonable to try cardioversion x 1; see how he feels, see if he holds SR He has missed a couple doses of Eliquis    Will need 4 wks of uninterrupted anticoaguation.    He is going to the fiar   Will arrnage for after    Set up appt for labs and reassessment later this fall to make sure in afib (Note he was told by PCP he was in SR at last visit   BUT EKG not done   ? If afib and rate regular)  2   HL   PT on lipitor   LDL 73  HDL 59  Trig38   Discussed diet     3 DM Last A1C 9    Reviewed   Limit carbs   Recom unprocessed foods  No coke     4  OSA   Needs CPAP  5  Tob   Stop smoking         Current  medicines are reviewed at length with the patient today.  The patient does not have concerns regarding medicines.  Signed, [name], MD  06/15/2022 2:32 PM    [facility] [place], [place], [place]  [address] Phone: [phone]; Fax: [phone]

## 2022-06-15 NOTE — Patient Instructions (Signed)
Medication Instructions:  Your physician recommends that you continue on your current medications as directed. Please refer to the Current Medication list given to you today.  Make Sure to Take Your Eliquis Two Times a Day   *If you need a refill on your cardiac medications before your next appointment, please call your pharmacy*   Lab Work: NONE   If you have labs (blood work) drawn today and your tests are completely normal, you will receive your results only by: [place] (if you have MyChart) OR A paper copy in the mail If you have any lab test that is abnormal or we need to change your treatment, we will call you to review the results.   Testing/Procedures: NONE    Follow-Up: At [facility], you and your health needs are our priority.  As part of our continuing mission to provide you with exceptional heart care, we have created designated Provider Care Teams.  These Care Teams include your primary Cardiologist (physician) and Advanced Practice Providers (APPs -  Physician Assistants and Nurse Practitioners) who all work together to provide you with the care you need, when you need it.  We recommend signing up for the patient portal called "MyChart".  Sign up information is provided on this After Visit Summary.  MyChart is used to connect with patients for Virtual Visits (Telemedicine).  Patients are able to view lab/test results, encounter notes, upcoming appointments, etc.  Non-urgent messages can be sent to your provider as well.   To learn more about what you can do with MyChart, go to [email or web address].    Your next appointment:    November   The format for your next appointment:   In Person  Provider:   [name], MD    Other Instructions Thank you for choosing [facility]!    Important Information About Sugar

## 2022-07-01 ENCOUNTER — Other Ambulatory Visit: Payer: Self-pay | Admitting: Family Medicine

## 2022-07-01 DIAGNOSIS — I480 Paroxysmal atrial fibrillation: Secondary | ICD-10-CM

## 2022-07-01 DIAGNOSIS — I10 Essential (primary) hypertension: Secondary | ICD-10-CM

## 2022-07-01 DIAGNOSIS — N183 Chronic kidney disease, stage 3 unspecified: Secondary | ICD-10-CM

## 2022-07-02 DIAGNOSIS — J449 Chronic obstructive pulmonary disease, unspecified: Secondary | ICD-10-CM | POA: Diagnosis not present

## 2022-07-02 DIAGNOSIS — J9601 Acute respiratory failure with hypoxia: Secondary | ICD-10-CM | POA: Diagnosis not present

## 2022-07-24 NOTE — Progress Notes (Signed)
Cardiology Office Note:    Date:  07/30/2022   ID:  Steven Henry, DOB 03-05-1951, MRN [id number]  PCP:  [name], MD  [place] Providers Cardiologist:  [name], MD     Referring MD: [name], MD   Chief Complaint:  Follow-up and Shortness of Breath     History of Present Illness:   Steven Henry is a 71 y.o. male with a history of COPD, OSA (on CPAP), PVOD, HTN, T2DM, tobacco abuse.  Patient saw Dr. Harrington Challenger 06/15/22 after being diagnosed with Afib and CHF while in [place]. He left AMA. Admitted to [facility] 02/2022 with CHF and sent home on lasix and eliquis. PCP told him he wasn't in Afib in Aug but EKG wasn't done.     He was in Afib and she recommended DCCV but he had missed some eliquis doses.   Patient comes in for f/u. Worked the [employer] etc. He ran out meds and missed 4 days of everything 10/13-10/23/23. He also missed his dose of Eliquis last night. He denies chest pain or palpitations. DOE just walking short distance. Smoking 1 ppd but down from 2-3 ppd. 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 [place] [employer]. His daughter his increased his lasix 80 mg a day.      Past Medical History:  Diagnosis Date   A-fib ([place])    CHF (congestive heart failure) ([facility])    CKD (chronic kidney disease), stage III (HCC)    Colon polyps    Diabetes mellitus type II, uncontrolled    Diabetes mellitus without complication ([place])    Hypertension    Morbid obesity ([place])    PVD (peripheral vascular disease) ([place])    right femoral artery   Sleep apnea    not on CPAP    Sleep apnea    Current Medications: Current Meds  Medication Sig   Accu-Chek FastClix Lancets MISC Check BS QAM - DX: E11.9   apixaban (ELIQUIS) 5 MG TABS tablet Take 1 tablet (5 mg total) by mouth 2 (two) times daily.   atorvastatin (LIPITOR) 40 MG tablet Take 1 tablet (40 mg total) by mouth daily.   Blood Glucose Monitoring Suppl (ONE  TOUCH ULTRA 2) w/Device KIT USETO CHECK BLOOD SUGAR EVERY MORNING   Cholecalciferol (VITAMIN D3) 10 MCG (400 UNIT) tablet Take 800 Units by mouth daily.   Continuous Blood Gluc Receiver (FREESTYLE LIBRE 2 READER) DEVI For continuous blood glucose monitoring   Continuous Blood Gluc Sensor (FREESTYLE LIBRE 3 SENSOR) MISC Place 1 sensor on the skin every 14 days. Use to check glucose continuously   furosemide (LASIX) 40 MG tablet TAKE 1 TABLET BY MOUTH TWICE DAILY   glipiZIDE (GLUCOTROL) 10 MG tablet TAKE 1 TABLET BY MOUTH EVERY DAY BEFORE breakfast (Patient taking differently: Take 10 mg by mouth daily before breakfast.)   GLYXAMBI 25-5 MG TABS TAKE 1 TABLET BY MOUTH DAILY   insulin glargine (LANTUS) 100 UNIT/ML injection Inject 0.15 mLs (15 Units total) into the skin daily. PLEASE INCREASE INSULIN BY 1 UNIT DAILY UNTIL FBG IS <130, THEN HOLD AT THAT DOSE (Patient taking differently: Inject 25 Units into the skin daily. PLEASE INCREASE INSULIN BY 1 UNIT DAILY UNTIL FBG IS <130, THEN HOLD AT THAT DOSE)   ipratropium-albuterol (DUONEB) 0.5-2.5 (3) MG/3ML SOLN Take 3 mLs by nebulization 3 (three) times daily.   Lancets Misc. (ACCU-CHEK FASTCLIX LANCET) KIT Check BS QAM -  DX: E11.9   metFORMIN (GLUCOPHAGE) 1000 MG tablet TAKE 1 TABLET BY MOUTH TWICE DAILY WITH a meal (Patient taking differently: Take 1,000 mg by mouth 2 (two) times daily with a meal.)   metoprolol succinate (TOPROL-XL) 25 MG 24 hr tablet Take 1 tablet (25 mg total) by mouth daily.   Multiple Vitamin (MULTIVITAMIN) capsule Take 1 capsule by mouth daily. Mens one a day   Multiple Vitamins-Minerals (ZINC PO) Take 1 tablet by mouth daily.   ONETOUCH ULTRA test strip USE TO CHECK BLOOD SUGAR EVERY MORNING   potassium chloride SA (KLOR-CON M20) 20 MEQ tablet Take 1 tablet (20 mEq total) by mouth daily.   [DISCONTINUED] potassium chloride (KLOR-CON M) 10 MEQ tablet TAKE 1 TABLET BY MOUTH DAILY    Allergies:   Patient has no known allergies.    Social History   Tobacco Use   Smoking status: Every Day    Packs/day: 1.50    Years: 30.00    Total pack years: 45.00    Types: Cigarettes   Smokeless tobacco: Never  Vaping Use   Vaping Use: Never used  Substance Use Topics   Alcohol use: Yes    Comment: occasional   Drug use: No    Family Hx: The patient's family history includes Colon cancer in his mother; Diabetes in his mother, sister, sister, and sister; Heart disease in his mother.  ROS     Physical Exam:    VS:  BP 112/72   Pulse 78   Ht _0  (1.854 m)   Wt (!) 300 lb 6.4 oz (136.3 kg)   SpO2 90%   BMI 39.63 kg/m     Wt Readings from Last 3 Encounters:  07/30/22 (!) 300 lb 6.4 oz (136.3 kg)  06/15/22 296 lb 6.4 oz (134.4 kg)  04/26/22 300 lb (136.1 kg)    Physical Exam  GEN: Obese, in no acute distress  Neck: no JVD, carotid bruits, or masses Cardiac:RRR; no murmurs, rubs, or gallops  Respiratory:  decreased breath sounds with scattered wheezing and rhonchi GI: soft, nontender, nondistended, + BS Ext: plus 2 brawny edema bilaterally decreased distal pulses  Neuro:  Alert and Oriented x 3,  Psych: euthymic mood, full affect        EKGs/Labs/Other Test Reviewed:    EKG:  EKG is   ordered today.  The ekg ordered today demonstrates Afib 67/m RBBB, nonspecific ST changes  Recent Labs: 02/25/2022: B Natriuretic Peptide 113.0 02/26/2022: ALT 25; TSH 1.990 03/01/2022: Magnesium 2.5 03/20/2022: Hemoglobin 18.9; Platelets 175 04/26/2022: BUN 17; Creat 0.98; Potassium 3.7; Sodium 141   Recent Lipid Panel Recent Labs    02/27/22 0424  CHOL 140  TRIG 38  HDL 59  VLDL 8  LDLCALC 73     Prior CV Studies:    Echo   02/2022 eft ventricular ejection fraction, by estimation, is 65 to 70%. The left ventricle has normal function. The left ventricle has no regional wall motion abnormalities. There is mild left ventricular hypertrophy. Left ventricular diastolic parameters are indeterminate. 1. Right  ventricular systolic function is normal. The right ventricular size is moderately enlarged. There is moderately elevated pulmonary artery systolic pressure. The estimated right ventricular systolic pressure is 42.6 mmHg. 2. 3. Left atrial size was mildly dilated. 4. Right atrial size was moderately dilated. The mitral valve is normal in structure. No evidence of mitral valve regurgitation. No evidence of mitral stenosis. 5. The aortic valve is calcified. There is moderate calcification of the  aortic valve. There is mild thickening of the aortic valve. Aortic valve regurgitation is not visualized. Aortic valve sclerosis is present, with no evidence of aortic valve stenosis. 6. Aortic dilatation noted. There is mild dilatation of the aortic root, measuring 41 mm. There is mild dilatation of the ascending aorta, measuring 41 mm. 7. The inferior vena cava is dilated in size with <50% respiratory variability, suggesting right atrial pressure of 15 mmHg.     Risk Assessment/Calculations/Metrics:    CHA2DS2-VASc Score = 5   This indicates a 7.2% annual risk of stroke. The patient's score is based upon: CHF History: 1 HTN History: 1 Diabetes History: 1 Stroke History: 0 Vascular Disease History: 1 Age Score: 1 Gender Score: 0             ASSESSMENT & PLAN:   No problem-specific Assessment & Plan notes found for this encounter.   PAF on eliquis and toprol. Plan was to set up for DCCV but he missed a dose of eliquis last night and 4 days in Oct. Discussed importance of not missing any doses of meds especially eliquis. Will have him return in 3-4 weeks to re access and schedule cardioverion  Diastolic CHF when in Afib with RVR. Patient's weight is up 4 lbs and has some edema on board. Increase lasix 80 mg am and 40 mg pm for 4 days then back to 40 mg bid. Increase K 10 meq 2 day. Check bmet today. 2 gm sodium diet.  HTN controlled  OSA on CPAP-needs new mask  DM2 per  PCP  PVOD  Tobacco abuse-has cut back but needs to stop. We discussed.           Dispo:  No follow-ups on file.   Medication Adjustments/Labs and Tests Ordered: Current medicines are reviewed at length with the patient today.  Concerns regarding medicines are outlined above.  Tests Ordered: Orders Placed This Encounter  Procedures   Basic metabolic panel   Medication Changes: Meds ordered this encounter  Medications   potassium chloride SA (KLOR-CON M20) 20 MEQ tablet    Sig: Take 1 tablet (20 mEq total) by mouth daily.    Dispense:  90 tablet    Refill:  3   Signed, [name], PA-C  07/30/2022 2:05 PM    [facility] [place], [place], [place]  [address] Phone: [phone]; Fax: [phone]

## 2022-07-27 ENCOUNTER — Telehealth: Payer: Self-pay | Admitting: Family Medicine

## 2022-07-27 ENCOUNTER — Other Ambulatory Visit: Payer: Self-pay | Admitting: Family Medicine

## 2022-07-27 NOTE — Telephone Encounter (Signed)
Last OV 04/26/22.  Requested Prescriptions  Pending Prescriptions Disp Refills   ONETOUCH ULTRA test strip [Pharmacy Med Name: OneTouch Ultra Test strips] 100 strip 12    Sig: USE TO [place]     Endocrinology: Diabetes - Testing Supplies Passed - 07/27/2022  9:49 AM      Passed - Valid encounter within last 12 months    Recent Outpatient Visits           6 months ago Acute pain of left knee   [place] [name], MD   8 months ago Rib pain on left side   [place] [name], MD   9 months ago Rib pain on left side   [place] Eulogio Bear, NP   10 months ago Uncontrolled type 2 diabetes mellitus with hyperosmolarity without coma, without long-term current use of insulin ([place])   [place] Pickard, Cammie Mcgee, MD   1 year ago Acute pulmonary edema ([place])   [facility] Pickard, Cammie Mcgee, MD       Future Appointments             In 3 days [place] Jennye Moccasin, PA-C [place] [facility] [place]. [facility] [place] [facility]             furosemide (LASIX) 40 MG tablet [Pharmacy Med Name: furosemide 40 mg tablet] 180 tablet 1    Sig: TAKE 1 TABLET BY MOUTH TWICE DAILY     Cardiovascular:  Diuretics - Loop Failed - 07/27/2022  9:49 AM      Failed - Mg Level in normal range and within 180 days    Magnesium  Date Value Ref Range Status  03/01/2022 2.5 (H) 1.7 - 2.4 mg/dL Final    Comment:    Performed at [facility], [address] [place], [place] [address]         Failed - Valid encounter within last 6 months    Recent Outpatient Visits           6 months ago Acute pain of left knee   [place] [name], MD   8 months ago Rib pain on left side   [place] [name], MD   9 months ago Rib pain on left side   [place] Eulogio Bear, NP    10 months ago Uncontrolled type 2 diabetes mellitus with hyperosmolarity without coma, without long-term current use of insulin ([place])   [place] Pickard, Cammie Mcgee, MD   1 year ago Acute pulmonary edema ([place])   [facility] Pickard, Cammie Mcgee, MD       Future Appointments             In 3 days [place] Jennye Moccasin, PA-C [place] at [place]. [facility] [place] [facility]            Passed - K in normal range and within 180 days    Potassium  Date Value Ref Range Status  04/26/2022 3.7 3.5 - 5.3 mmol/L Final         Passed - Ca in normal range and within 180 days    Calcium  Date Value Ref Range Status  04/26/2022 9.7 8.6 - 10.3 mg/dL Final  Calcium, Ion  Date Value Ref Range Status  10/10/2019 1.14 (L) 1.15 - 1.40 mmol/L Final         Passed - Na in normal range and within 180 days    Sodium  Date Value Ref Range Status  04/26/2022 141 135 - 146 mmol/L Final         Passed - Cr in normal range and within 180 days    Creat  Date Value Ref Range Status  04/26/2022 0.98 0.70 - 1.28 mg/dL Final         Passed - Cl in normal range and within 180 days    Chloride  Date Value Ref Range Status  04/26/2022 98 98 - 110 mmol/L Final         Passed - Last BP in normal range    BP Readings from Last 1 Encounters:  06/15/22 100/60

## 2022-07-27 NOTE — Telephone Encounter (Signed)
Requested medication (s) are due for refill today: expired   Requested medication (s) are on the active medication list: yes  Last refill:  05/26/21 #100 each 12 refills  Future visit scheduled: no  Notes to clinic:  expired medication. Last OV 04/26/22. Do you want to renew Rx?     Requested Prescriptions  Pending Prescriptions Disp Refills   ONETOUCH ULTRA test strip [Pharmacy Med Name: OneTouch Ultra Test strips] 100 strip 12    Sig: USE TO [place]     Endocrinology: Diabetes - Testing Supplies Passed - 07/27/2022  9:49 AM      Passed - Valid encounter within last 12 months    Recent Outpatient Visits           6 months ago Acute pain of left knee   [place] [name], MD   8 months ago Rib pain on left side   [place] [name], MD   9 months ago Rib pain on left side   [place] Eulogio Bear, NP   10 months ago Uncontrolled type 2 diabetes mellitus with hyperosmolarity without coma, without long-term current use of insulin ([place])   [place] Pickard, Cammie Mcgee, MD   1 year ago Acute pulmonary edema ([place])   [facility] Pickard, Cammie Mcgee, MD       Future Appointments             In 3 days [place] Jennye Moccasin, PA-C [place] at [place]. [facility] [place] [facility]            Signed Prescriptions Disp Refills   furosemide (LASIX) 40 MG tablet 180 tablet 1    Sig: TAKE 1 TABLET BY MOUTH TWICE DAILY     Cardiovascular:  Diuretics - Loop Failed - 07/27/2022  9:49 AM      Failed - Mg Level in normal range and within 180 days    Magnesium  Date Value Ref Range Status  03/01/2022 2.5 (H) 1.7 - 2.4 mg/dL Final    Comment:    Performed at [facility], [address] [place], [place] [address]         Failed - Valid encounter within last 6 months    Recent Outpatient Visits           6 months ago  Acute pain of left knee   [place] [name], MD   8 months ago Rib pain on left side   [place] [name], MD   9 months ago Rib pain on left side   [place] Eulogio Bear, NP   10 months ago Uncontrolled type 2 diabetes mellitus with hyperosmolarity without coma, without long-term current use of insulin ([place])   [place] Pickard, Cammie Mcgee, MD   1 year ago Acute pulmonary edema ([place])   [place], Cammie Mcgee, MD       Future Appointments             In 3 days [place] Jennye Moccasin, PA-C [place] at [place]. [facility] [place] [facility]            Passed - K in normal range and within 180 days    Potassium  Date Value Ref Range Status  04/26/2022 3.7 3.5 - 5.3 mmol/L Final         Passed - Ca in normal range and within 180 days    Calcium  Date Value Ref Range Status  04/26/2022 9.7 8.6 - 10.3 mg/dL Final   Calcium, Ion  Date Value Ref Range Status  10/10/2019 1.14 (L) 1.15 - 1.40 mmol/L Final         Passed - Na in normal range and within 180 days    Sodium  Date Value Ref Range Status  04/26/2022 141 135 - 146 mmol/L Final         Passed - Cr in normal range and within 180 days    Creat  Date Value Ref Range Status  04/26/2022 0.98 0.70 - 1.28 mg/dL Final         Passed - Cl in normal range and within 180 days    Chloride  Date Value Ref Range Status  04/26/2022 98 98 - 110 mmol/L Final         Passed - Last BP in normal range    BP Readings from Last 1 Encounters:  06/15/22 100/60

## 2022-07-27 NOTE — Telephone Encounter (Signed)
Form received for patient to receive CPAP (Auto-Titrating); requires completion and provider's signature.   Form placed on desk of nurse.  After completion, fax to [phone].

## 2022-07-30 ENCOUNTER — Other Ambulatory Visit
Admission: RE | Admit: 2022-07-30 | Discharge: 2022-07-30 | Disposition: A | Discharge status: Home / Self Care | Payer: Medicare Other | Source: Ambulatory Visit | Attending: Physician Assistant | Admitting: Physician Assistant

## 2022-07-30 ENCOUNTER — Ambulatory Visit: Payer: Medicare Other | Admitting: Physician Assistant

## 2022-07-30 ENCOUNTER — Encounter: Payer: Self-pay | Admitting: Physician Assistant

## 2022-07-30 VITALS — BP 112/72 | HR 78 | Ht 73.0 in | Wt 300.4 lb

## 2022-07-30 DIAGNOSIS — I480 Paroxysmal atrial fibrillation: Secondary | ICD-10-CM | POA: Diagnosis not present

## 2022-07-30 DIAGNOSIS — I5032 Chronic diastolic (congestive) heart failure: Secondary | ICD-10-CM | POA: Diagnosis not present

## 2022-07-30 DIAGNOSIS — I10 Essential (primary) hypertension: Secondary | ICD-10-CM

## 2022-07-30 DIAGNOSIS — E1165 Type 2 diabetes mellitus with hyperglycemia: Secondary | ICD-10-CM | POA: Diagnosis not present

## 2022-07-30 DIAGNOSIS — N183 Chronic kidney disease, stage 3 unspecified: Secondary | ICD-10-CM | POA: Insufficient documentation

## 2022-07-30 DIAGNOSIS — Z720 Tobacco use: Secondary | ICD-10-CM

## 2022-07-30 DIAGNOSIS — G4733 Obstructive sleep apnea (adult) (pediatric): Secondary | ICD-10-CM

## 2022-07-30 LAB — BASIC METABOLIC PANEL
Anion gap: 8 (ref 5–15)
BUN: 15 mg/dL (ref 8–23)
CO2: 30 mmol/L (ref 22–32)
Calcium: 9 mg/dL (ref 8.9–10.3)
Chloride: 99 mmol/L (ref 98–111)
Creatinine, Ser: 0.72 mg/dL (ref 0.61–1.24)
GFR, Estimated: >60 mL/min (ref >60)
Glucose, Bld: 156 mg/dL — ABNORMAL HIGH (ref 70–99)
Potassium: 3.6 mmol/L (ref 3.5–5.1)
Sodium: 137 mmol/L (ref 135–145)

## 2022-07-30 MED ORDER — POTASSIUM CHLORIDE CRYS ER 20 MEQ PO TBCR: 20.0000 meq | EXTENDED_RELEASE_TABLET | Freq: Every day | ORAL | 3 refills | Status: DC

## 2022-07-30 NOTE — Addendum Note (Signed)
Addended by: Barbarann Ehlers A on: 07/30/2022 04:38 PM   Modules accepted: Orders

## 2022-07-30 NOTE — Patient Instructions (Addendum)
Medication Instructions:  Your physician has recommended you make the following change in your medication:  INCREASE Lasix to 40 mg tablets (2 tablets in the am, 1 tablet in the pm) for 4 days- then go back to previous dosing of 40 mg twice daily.  Increase Potassium Chloride to 20 mEq tablets daily   Labwork: Today: -BMET  Testing/Procedures: None Today   Follow-Up: Follow up with APP in 3 weeks to discuss Cardioversion.   Any Other Special Instructions Will Be Listed Below (If Applicable).     If you need a refill on your cardiac medications before your next appointment, please call your pharmacy.

## 2022-07-31 ENCOUNTER — Telehealth: Payer: Self-pay | Admitting: Internal Medicine

## 2022-07-31 NOTE — Telephone Encounter (Signed)
Patient returned LPN's call for his lab results. He states a callback is not necessary due to his daughter having them on mychart.

## 2022-07-31 NOTE — Telephone Encounter (Signed)
Noted  

## 2022-08-02 DIAGNOSIS — J9601 Acute respiratory failure with hypoxia: Secondary | ICD-10-CM | POA: Diagnosis not present

## 2022-08-02 DIAGNOSIS — J449 Chronic obstructive pulmonary disease, unspecified: Secondary | ICD-10-CM | POA: Diagnosis not present

## 2022-08-04 ENCOUNTER — Other Ambulatory Visit: Payer: Self-pay | Admitting: Family Medicine

## 2022-08-06 DIAGNOSIS — J1282 Pneumonia due to coronavirus disease 2019: Secondary | ICD-10-CM | POA: Diagnosis not present

## 2022-08-06 DIAGNOSIS — J449 Chronic obstructive pulmonary disease, unspecified: Secondary | ICD-10-CM | POA: Diagnosis not present

## 2022-08-06 DIAGNOSIS — U071 COVID-19: Secondary | ICD-10-CM | POA: Diagnosis not present

## 2022-08-06 NOTE — Telephone Encounter (Signed)
Refilled 09/22/2021 #90 3 rf. Requested Prescriptions  Pending Prescriptions Disp Refills   glipiZIDE (GLUCOTROL) 10 MG tablet [Pharmacy Med Name: glipizide 10 mg tablet] 90 tablet 3    Sig: TAKE 1 TABLET BY MOUTH EVERY DAY BEFORE breakfast     Endocrinology:  Diabetes - Sulfonylureas Failed - 08/04/2022  8:01 AM      Failed - HBA1C is between 0 and 7.9 and within 180 days    Hgb A1c MFr Bld  Date Value Ref Range Status  04/26/2022 9.0 (H) <5.7 % of total Hgb Final    Comment:    For someone without known diabetes, a hemoglobin A1c value of 6.5% or greater indicates that they may have  diabetes and this should be confirmed with a follow-up  test. . For someone with known diabetes, a value <7% indicates  that their diabetes is well controlled and a value  greater than or equal to 7% indicates suboptimal  control. A1c targets should be individualized based on  duration of diabetes, age, comorbid conditions, and  other considerations. . Currently, no consensus exists regarding use of hemoglobin A1c for diagnosis of diabetes for children. .          Failed - Valid encounter within last 6 months    Recent Outpatient Visits           6 months ago Acute pain of left knee   [place] [name], MD   9 months ago Rib pain on left side   [place] Dennard Schaumann, Cammie Mcgee, MD   9 months ago Rib pain on left side   [place] Eulogio Bear, NP   10 months ago Uncontrolled type 2 diabetes mellitus with hyperosmolarity without coma, without long-term current use of insulin ([place])   [place] Pickard, Cammie Mcgee, MD   1 year ago Acute pulmonary edema ([place])   [place] Pickard, Cammie Mcgee, MD       Future Appointments             In 2 weeks Finis Bud, NP [place]. [facility]            Passed - Cr in normal range  and within 360 days    Creat  Date Value Ref Range Status  04/26/2022 0.98 0.70 - 1.28 mg/dL Final   Creatinine, Ser  Date Value Ref Range Status  07/30/2022 0.72 0.61 - 1.24 mg/dL Final

## 2022-08-11 ENCOUNTER — Other Ambulatory Visit: Payer: Self-pay | Admitting: Family Medicine

## 2022-08-11 DIAGNOSIS — E7800 Pure hypercholesterolemia, unspecified: Secondary | ICD-10-CM

## 2022-08-11 DIAGNOSIS — E1100 Type 2 diabetes mellitus with hyperosmolarity without nonketotic hyperglycemic-hyperosmolar coma (NKHHC): Secondary | ICD-10-CM

## 2022-08-13 ENCOUNTER — Other Ambulatory Visit: Payer: Self-pay | Admitting: Family Medicine

## 2022-08-21 ENCOUNTER — Encounter: Payer: Self-pay | Admitting: Nurse Practitioner

## 2022-08-21 ENCOUNTER — Other Ambulatory Visit: Payer: Self-pay | Admitting: Nurse Practitioner

## 2022-08-21 ENCOUNTER — Ambulatory Visit: Payer: Medicare Other | Attending: Nurse Practitioner | Admitting: Nurse Practitioner

## 2022-08-21 ENCOUNTER — Ambulatory Visit: Payer: Medicare Other | Attending: Nurse Practitioner

## 2022-08-21 ENCOUNTER — Telehealth: Payer: Self-pay | Admitting: Nurse Practitioner

## 2022-08-21 VITALS — BP 116/82 | HR 56 | Ht 73.0 in | Wt 297.6 lb

## 2022-08-21 DIAGNOSIS — Z720 Tobacco use: Secondary | ICD-10-CM | POA: Diagnosis not present

## 2022-08-21 DIAGNOSIS — I5032 Chronic diastolic (congestive) heart failure: Secondary | ICD-10-CM | POA: Diagnosis not present

## 2022-08-21 DIAGNOSIS — I480 Paroxysmal atrial fibrillation: Secondary | ICD-10-CM | POA: Diagnosis not present

## 2022-08-21 DIAGNOSIS — I77819 Aortic ectasia, unspecified site: Secondary | ICD-10-CM | POA: Diagnosis not present

## 2022-08-21 DIAGNOSIS — R001 Bradycardia, unspecified: Secondary | ICD-10-CM

## 2022-08-21 DIAGNOSIS — I272 Pulmonary hypertension, unspecified: Secondary | ICD-10-CM

## 2022-08-21 DIAGNOSIS — G4733 Obstructive sleep apnea (adult) (pediatric): Secondary | ICD-10-CM

## 2022-08-21 DIAGNOSIS — E66812 Obesity, class 2: Secondary | ICD-10-CM

## 2022-08-21 DIAGNOSIS — J449 Chronic obstructive pulmonary disease, unspecified: Secondary | ICD-10-CM | POA: Diagnosis not present

## 2022-08-21 DIAGNOSIS — E6609 Other obesity due to excess calories: Secondary | ICD-10-CM

## 2022-08-21 DIAGNOSIS — I10 Essential (primary) hypertension: Secondary | ICD-10-CM | POA: Diagnosis not present

## 2022-08-21 DIAGNOSIS — Z6839 Body mass index (BMI) 39.0-39.9, adult: Secondary | ICD-10-CM

## 2022-08-21 MED ORDER — METOPROLOL SUCCINATE ER 25 MG PO TB24: 12.5000 mg | ORAL_TABLET | Freq: Every day | ORAL | 1 refills | Status: DC

## 2022-08-21 NOTE — Progress Notes (Unsigned)
Cardiology Office Note:    Date:  08/21/2022  ID:  Steven Henry, DOB [birth year], MRN [id number]  PCP:  [name], MD   [name] Cardiologist:  [name], MD     Referring MD: [name], MD   CC: Here for follow-up for Paroxysmal A-fib  History of Present Illness:    Steven Henry is a 71 y.o. male with a hx of the following:  PAF; on Eliquis HTN COPD, Pulmonary HTN OSA on CPAP T2DM Tobacco abuse HFpEF Aortic dilatation Obesity  Pt is a 71 year old male with past medical history as mentioned above.  Pt was in [place] in 2023, and presented to local hospital with CHF and SOB, found to be in A-fib.  Was diuresed and left AMA.  In June 2023 presented to [facility] with increased abdominal swelling and orthopnea.  Treated with IV Lasix, echo revealed EF 65 to 70%.  Was sent home on p.o. Lasix and Eliquis.  At follow-up with PCP, was not found to be in A-fib in August 2023.  Last seen by [name] on 07/30/22 at f/u.  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  Ran out of medications x 4 days.  Missed dose of Eliquis the night before his visit. Denied any palpitaitons or chest pain.  Noted dyspnea on exertion with walking short distances.  Daughter had increased his Lasix to 80 mg daily, admitted to dietary indiscretions, noted to be smoking 1 pack/day.  He was originally set up for cardioversion, however he had skipped doses of his medications.  [name] discussed with him the importance of not skipping any doses of Eliquis and to follow up in 3-4 weeks to reassess. Lasix was also increased at this visit due to weight gain of 4 lbs, as well as some edema.   Today he presents for follow-up. He states he is doing very well. He states he has been compliant with Eliquis, and has not skipped any doses of his medication. Denies any CP, SHOB, palpitations, syncope, presyncope, dizziness, orthopnea, PND, swelling,  significant weight changes, acute bleeding, or claudication.  Compliant with CPAP usage every night.  Continues to smoke. Denies any other questions or concerns today.   Past Medical History:  Diagnosis Date   A-fib ([place])    CHF (congestive heart failure) (HCC)    CKD (chronic kidney disease), stage III (HCC)    Colon polyps    Diabetes mellitus type II, uncontrolled    Diabetes mellitus without complication ([place])    Hypertension    Morbid obesity ([place])    PVD (peripheral vascular disease) ([place])    right femoral artery   Sleep apnea    not on CPAP    Sleep apnea     Past Surgical History:  Procedure Laterality Date   COLONOSCOPY N/A 09/16/2015   Procedure: COLONOSCOPY;  Surgeon: Daneil Dolin, MD;  Location: [facility];  Service: Endoscopy;  Laterality: N/A;  8:15 AM - pt has c pap   ENDOVENOUS ABLATION SAPHENOUS VEIN W/ LASER Right 08/26/2017   endovenous laser ablation right greater saphenous vein by Tinnie Gens MD    stab phlebectomy  Right 01/07/2018   stab phlebectomy 10-20 incisions right leg by Tinnie Gens MD     Current Medications: Current Meds  Medication Sig   Accu-Chek FastClix Lancets MISC Check BS QAM - DX: E11.9   apixaban (ELIQUIS) 5 MG TABS tablet Take 1 tablet (5  mg total) by mouth 2 (two) times daily.   atorvastatin (LIPITOR) 40 MG tablet TAKE 1 TABLET BY MOUTH DAILY   Blood Glucose Monitoring Suppl (ONE TOUCH ULTRA 2) w/Device KIT USETO CHECK BLOOD SUGAR EVERY MORNING   Cholecalciferol (VITAMIN D3) 10 MCG (400 UNIT) tablet Take 800 Units by mouth daily.   Continuous Blood Gluc Receiver (FREESTYLE LIBRE 2 READER) DEVI For continuous blood glucose monitoring   Continuous Blood Gluc Sensor (FREESTYLE LIBRE 3 SENSOR) MISC Place 1 sensor on the skin every 14 days. Use to check glucose continuously   furosemide (LASIX) 40 MG tablet TAKE 1 TABLET BY MOUTH TWICE DAILY   glipiZIDE (GLUCOTROL) 10 MG tablet TAKE 1 TABLET BY MOUTH EVERY DAY BEFORE breakfast    GLYXAMBI 25-5 MG TABS TAKE 1 TABLET BY MOUTH DAILY   insulin glargine (LANTUS) 100 UNIT/ML injection Inject 0.15 mLs (15 Units total) into the skin daily. PLEASE INCREASE INSULIN BY 1 UNIT DAILY UNTIL FBG IS <130, THEN HOLD AT THAT DOSE (Patient taking differently: Inject 25 Units into the skin daily. PLEASE INCREASE INSULIN BY 1 UNIT DAILY UNTIL FBG IS <130, THEN HOLD AT THAT DOSE)   ipratropium-albuterol (DUONEB) 0.5-2.5 (3) MG/3ML SOLN Take 3 mLs by nebulization 3 (three) times daily.   Lancets Misc. (ACCU-CHEK FASTCLIX LANCET) KIT Check BS QAM - DX: E11.9   metFORMIN (GLUCOPHAGE) 1000 MG tablet TAKE 1 TABLET BY MOUTH TWICE DAILY WITH a meal   Multiple Vitamin (MULTIVITAMIN) capsule Take 1 capsule by mouth daily. Mens one a day   Multiple Vitamins-Minerals (ZINC PO) Take 1 tablet by mouth daily.   ONETOUCH ULTRA test strip USE TO CHECK BLOOD SUGAR EVERY MORNING   potassium chloride SA (KLOR-CON M20) 20 MEQ tablet Take 1 tablet (20 mEq total) by mouth daily.   metoprolol succinate (TOPROL-XL) 25 MG 24 hr tablet Take 1 tablet (25 mg total) by mouth daily.     Allergies:   Patient has no known allergies.   Social History   Socioeconomic History   Marital status: Divorced    Spouse name: Not on file   Number of children: 2   Years of education: Not on file   Highest education level: Not on file  Occupational History   Occupation: Self Employed  Tobacco Use   Smoking status: Every Day    Packs/day: 1.50    Years: 30.00    Total pack years: 45.00    Types: Cigarettes    Start date: 04/04/1958   Smokeless tobacco: Never  Vaping Use   Vaping Use: Never used  Substance and Sexual Activity   Alcohol use: Yes    Comment: occasional   Drug use: No   Sexual activity: Not on file    Comment: divorced, 2 daughters.  works Psychologist, occupational for fairs, Social research officer, government.  Other Topics Concern   Not on file  Social History Narrative   Not on file   Social Determinants of Health   Financial  Resource Strain: Not on file  Food Insecurity: Not on file  Transportation Needs: Not on file  Physical Activity: Not on file  Stress: Not on file  Social Connections: Not on file     Family History: The patient's family history includes Colon cancer in his mother; Diabetes in his mother, sister, sister, and sister; Heart disease in his mother.  ROS:   Review of Systems  Constitutional: Negative.   HENT: Negative.    Eyes: Negative.   Respiratory: Negative.    Cardiovascular: Negative.  Gastrointestinal: Negative.   Genitourinary: Negative.   Musculoskeletal: Negative.   Skin: Negative.        History of psoriasis/ dry skin; chronic skin discoloration along BLE  Neurological: Negative.   Endo/Heme/Allergies: Negative.     Please see the history of present illness.    All other systems reviewed and are negative.  EKGs/Labs/Other Studies Reviewed:    The following studies were reviewed today:   EKG:  EKG is ordered today.  The ekg ordered today demonstrates A-fib, 43 bpm, RBBB, otherwise no acute ischemic changes.   2D complete echocardiogram on February 26, 2022: 1. Left ventricular ejection fraction, by estimation, is 65 to 70%. The  left ventricle has normal function. The left ventricle has no regional  wall motion abnormalities. There is mild left ventricular hypertrophy.  Left ventricular diastolic parameters  are indeterminate.   2. Right ventricular systolic function is normal. The right ventricular  size is moderately enlarged. There is moderately elevated pulmonary artery  systolic pressure. The estimated right ventricular systolic pressure is  97.9 mmHg.   3. Left atrial size was mildly dilated.   4. Right atrial size was moderately dilated.   5. The mitral valve is normal in structure. No evidence of mitral valve  regurgitation. No evidence of mitral stenosis.   6. The aortic valve is calcified. There is moderate calcification of the  aortic valve. There is  mild thickening of the aortic valve. Aortic valve  regurgitation is not visualized. Aortic valve sclerosis is present, with  no evidence of aortic valve  stenosis.   7. Aortic dilatation noted. There is mild dilatation of the aortic root,  measuring 41 mm. There is mild dilatation of the ascending aorta,  measuring 41 mm.   8. The inferior vena cava is dilated in size with <50% respiratory  variability, suggesting right atrial pressure of 15 mmHg.   AAA duplex on August 04, 2020: Abdominal aorta: No evidence of an abdominal aortic aneurysm was visualized.  The largest aortic measurement was 2.7 cm.   Recent Labs: 02/25/2022: B Natriuretic Peptide 113.0 02/26/2022: ALT 25; TSH 1.990 03/01/2022: Magnesium 2.5 03/20/2022: Hemoglobin 18.9; Platelets 175 07/30/2022: BUN 15; Creatinine, Ser 0.72; Potassium 3.6; Sodium 137  Recent Lipid Panel    Component Value Date/Time   CHOL 140 02/27/2022 0424   TRIG 38 02/27/2022 0424   HDL 59 02/27/2022 0424   CHOLHDL 2.4 02/27/2022 0424   VLDL 8 02/27/2022 0424   LDLCALC 73 02/27/2022 0424   LDLCALC 119 (H) 09/15/2021 0825     Risk Assessment/Calculations:    CHA2DS2-VASc Score = 5  This indicates a 7.2% annual risk of stroke. The patient's score is based upon: CHF History: 1 HTN History: 1 Diabetes History: 1 Stroke History: 0 Vascular Disease History: 1 Age Score: 1 Gender Score: 0         Physical Exam:    VS:  BP 116/82 (BP Location: Right Arm, Patient Position: Sitting, Cuff Size: Normal)   Pulse (!) 56   Ht _0  (1.854 m)   Wt 297 lb 9.6 oz (135 kg)   SpO2 98%   BMI 39.26 kg/m     Wt Readings from Last 3 Encounters:  08/21/22 297 lb 9.6 oz (135 kg)  07/30/22 (!) 300 lb 6.4 oz (136.3 kg)  06/15/22 296 lb 6.4 oz (134.4 kg)     GEN: Obese, 71 y.o. Caucasian male in no acute distress HEENT: Normal NECK: No JVD; No carotid bruits CARDIAC: S1/S2,  irregular rhythm and slow rate, no murmurs, rubs, gallops; 2+ radial  pulses, strong and equal bilaterally; 1+ PT pulses, equal bilaterally RESPIRATORY:  Clear and diminished to auscultation without rales, wheezing or rhonchi  MUSCULOSKELETAL:  No edema; No deformity  SKIN: Warm and dry, scattered dry skin/psoriasis generalized, skin discoloration along bilateral lower extremities NEUROLOGIC:  Alert and oriented x 3 PSYCHIATRIC:  Normal affect   ASSESSMENT:    1. Paroxysmal atrial fibrillation (HCC)   2. Bradycardia   3. Hypertension, unspecified type   4. Chronic heart failure with preserved ejection fraction (HCC)   5. Tobacco abuse   6. Chronic obstructive pulmonary disease, unspecified COPD type ([place])   7. Pulmonary hypertension, unspecified ([place])   8. OSA on CPAP   9. Aortic dilatation (HCC)   10. Class 2 obesity due to excess calories with body mass index (BMI) of 39.0 to 39.9 in adult, unspecified whether serious comorbidity present    PLAN:    In order of problems listed above:  A-fib, bradycardia Denies any palpitations or fast heart rates.  Twelve-lead EKG reveals A-fib with slow ventricular rate, 43 bpm, chronic right bundle branch block, otherwise no acute ischemic changes.  He is asymptomatic with his heart rate.  Will reduce Toprol XL to 12.5 mg daily.  Recommend checking Magnesium and TSH level at PCP office visit on August 22, 2022. Will also arrange 14 day Zio monitor to evaluate for any pauses with heart rate. ED precautions discussed. Continue Eliquis 5 mg BID, currently on appropriate dosage. Heart healthy diet and regular cardiovascular exercise as tolerated encouraged.   HTN Initial BP on arrival 94/56, repeat BP by me 116/82.  Will reduce Toprol-XL as mentioned above.  Recommend checking magnesium and TSH level at PCP office on 08/22/2022. Discussed to monitor BP at home at least 2 hours after medications and sitting for 5-10 minutes. BP log given today. Heart healthy diet and regular cardiovascular exercise encouraged.   Chronic  HFpEF TTE in June 2023 revealed EF 65 to 70%, no RWMA, mild LVH. Euvolemic and well compensated on exam.  Continue Lasix and potassium.  Decrease Toprol-XL as mentioned above. Low sodium diet, fluid restriction <2L, and daily weights encouraged. Educated to contact our office for weight gain of 2 lbs overnight or 5 lbs in one week. Heart healthy diet and regular cardiovascular exercise as tolerated encouraged.    Tobacco abuse Continues to smoke around 1 pack/day.  Discussed and encouraged smoking cessation.  He politely declines any smoking cessation resources at this time.  COPD, pulmonary HTN Denies any recent shortness of breath or acute exacerbations of COPD.  TTE in June 2023 revealed moderately elevated PASP, estimated RVSP was 56.7 mmHg, most likely due to history of COPD and OSA. Continue to follow with PCP. Has seen Dr. Christinia Gully in the past, and may need to consider referral at next follow-up.   OSA on CPAP Encouraged continued compliance nightly. Continue to follow with PCP.  7. Aortic dilatation Noted on echocardiogram in June 2023.  Mild dilatation of aortic root, measuring 41 mm.  There was mild dilatation of ascending aorta, measuring 41 mm.  Recommend arranging repeat echocardiogram in June 2024 for monitoring.  8. Class 2 Obesity BMI 39.26. Weight loss via diet and exercise encouraged. Discussed the impact being overweight would have on cardiovascular risk. Heart healthy diet and regular cardiovascular exercise encouraged.   8. Disposition: Follow-up with me or Dr. [name] in 3 weeks or sooner if anything changes.  Medication Adjustments/Labs and Tests Ordered: Current medicines are reviewed at length with the patient today.  Concerns regarding medicines are outlined above.  Orders Placed This Encounter  Procedures   EKG 12-Lead   Meds ordered this encounter  Medications   metoprolol succinate (TOPROL-XL) 25 MG 24 hr tablet    Sig: Take 0.5 tablets (12.5 mg  total) by mouth daily.    Dispense:  45 tablet    Refill:  1    08/21/22 Dose decrease    Patient Instructions  Medication Instructions:  Your physician has recommended you make the following change in your medication:  Decrease metoprolol to 12.5 mg once a day Continue all other medications as directed  Labwork: none  Testing/Procedures: ZIO- Long Term Monitor Instructions   Your physician has requested you wear your ZIO patch monitor 14 days.   This is a single patch monitor.  [employer] supplies one patch monitor per enrollment.  Additional stickers are not available.   Applying the monitor   Do not shower for the first 24 hours.  You may shower after the first 24 hours.   Press button if you feel a symptom. You will hear a small click.  Record Date, Time and Symptom in the Patient Log Book.   When you are ready to remove patch, follow instructions on last 2 pages of Patient Log Book.  Stick patch monitor onto last page of Patient Log Book.   Place Patient Log Book in [place] box.  Use locking tab on box and tape box closed securely.  The [employer] has [employer] on it.  Please place in mailbox as soon as possible.  Your physician should have your test results approximately 7 days after the monitor has been mailed back to [facility].   Call [place] at [phone] if you have questions regarding your ZIO XT patch monitor.  Call them immediately if you see an orange light blinking on your monitor.   If your monitor falls off in less than 4 days contact our Monitor department at [phone].  If your monitor becomes loose or falls off after 4 days call [employer] at [phone] for suggestions on securing your monitor.   Follow-Up:  Your physician recommends that you schedule a follow-up appointment in: 3 weeks  Any Other Special Instructions Will Be Listed Below (If Applicable).  If you need a refill on your cardiac medications before  your next appointment, please call your pharmacy.    SignedFinis Bud, NP  08/22/2022 10:01 AM    [place]

## 2022-08-21 NOTE — Telephone Encounter (Signed)
PERCERT:     3 wk f/u 14 day ZIO dx: bradycardia

## 2022-08-21 NOTE — Patient Instructions (Signed)
Medication Instructions:  Your physician has recommended you make the following change in your medication:  Decrease metoprolol to 12.5 mg once a day Continue all other medications as directed  Labwork: none  Testing/Procedures: ZIO- Long Term Monitor Instructions   Your physician has requested you wear your ZIO patch monitor 14 days.   This is a single patch monitor.  [employer] supplies one patch monitor per enrollment.  Additional stickers are not available.   Applying the monitor   Do not shower for the first 24 hours.  You may shower after the first 24 hours.   Press button if you feel a symptom. You will hear a small click.  Record Date, Time and Symptom in the Patient Log Book.   When you are ready to remove patch, follow instructions on last 2 pages of Patient Log Book.  Stick patch monitor onto last page of Patient Log Book.   Place Patient Log Book in [place] box.  Use locking tab on box and tape box closed securely.  The [employer] has [employer] on it.  Please place in mailbox as soon as possible.  Your physician should have your test results approximately 7 days after the monitor has been mailed back to [facility].   Call [place] at [phone] if you have questions regarding your ZIO XT patch monitor.  Call them immediately if you see an orange light blinking on your monitor.   If your monitor falls off in less than 4 days contact our Monitor department at [phone].  If your monitor becomes loose or falls off after 4 days call [employer] at [phone] for suggestions on securing your monitor.   Follow-Up:  Your physician recommends that you schedule a follow-up appointment in: 3 weeks  Any Other Special Instructions Will Be Listed Below (If Applicable).  If you need a refill on your cardiac medications before your next appointment, please call your pharmacy.

## 2022-08-22 ENCOUNTER — Ambulatory Visit (INDEPENDENT_AMBULATORY_CARE_PROVIDER_SITE_OTHER): Payer: Medicare Other | Admitting: Family Medicine

## 2022-08-22 ENCOUNTER — Encounter: Payer: Self-pay | Admitting: Family Medicine

## 2022-08-22 ENCOUNTER — Encounter: Payer: Self-pay | Admitting: Nurse Practitioner

## 2022-08-22 VITALS — BP 126/78 | HR 86 | Ht 73.0 in | Wt 301.6 lb

## 2022-08-22 DIAGNOSIS — N183 Chronic kidney disease, stage 3 unspecified: Secondary | ICD-10-CM | POA: Diagnosis not present

## 2022-08-22 DIAGNOSIS — I480 Paroxysmal atrial fibrillation: Secondary | ICD-10-CM | POA: Diagnosis not present

## 2022-08-22 DIAGNOSIS — J449 Chronic obstructive pulmonary disease, unspecified: Secondary | ICD-10-CM | POA: Diagnosis not present

## 2022-08-22 DIAGNOSIS — E1165 Type 2 diabetes mellitus with hyperglycemia: Secondary | ICD-10-CM

## 2022-08-22 DIAGNOSIS — I739 Peripheral vascular disease, unspecified: Secondary | ICD-10-CM

## 2022-08-22 DIAGNOSIS — Z794 Long term (current) use of insulin: Secondary | ICD-10-CM | POA: Diagnosis not present

## 2022-08-22 DIAGNOSIS — I509 Heart failure, unspecified: Secondary | ICD-10-CM | POA: Diagnosis not present

## 2022-08-22 NOTE — Progress Notes (Signed)
Subjective:    03/20/22 Patient is here today for hospital discharge follow-up.  He denies any shortness of breath.  He has cut down from smoking 2 packs of cigarettes a day to three quarters of a pack a day.  He is working to try to quit smoking.  He states that his CPAP machine is no longer functioning.  However his insurance requires another sleep study to qualify him for another machine.  Therefore he is requesting referral to sleep this was.  He is still not checking her sugars.  However he states that his weight has improved dramatically.  He was over 330 pounds when he was hospitalized.  He is down to 304 pounds now.  He has trace bipedal edema.  Aside from wheezing, his lungs are clear to auscultation.  However he is still in atrial fibrillation however rate is controlled in the high 90s.  He denies any chest pain.  At that time, my plan was:  I explained to the patient the necessity of him using Eliquis twice daily indefinitely to prevent strokes.  Also explained to the patient the necessity of rate control to prevent RVR and fluid overload.  Therefore I would like to start him on Toprol-XL 25 mg a day to help try to keep his heart rate in the 60s to 70s.  Monitor for any bronchospasms on the beta-blocker.  The patient appears somewhat euvolemic today.  I will monitor his renal function and potassium.  As long as his potassium and renal function are stable I will not change his dose of Lasix.  I will consult a sleep specialist to repeat a sleep study to ensure that his CPAP machine is functioning appropriately to help reduce the strain on his heart.  I will also consult cardiology as the patient is interested in rhythm control.  There was mild atrial dilatation on his echocardiogram.  Check a hemoglobin A1c today to monitor his glycemic control.  04/26/22 Patient has spontaneously converted back to normal sinus rhythm.  Today his heart rate is in the 60s and in sinus rhythm.  There is no edema on  physical exam.  In fact the patient appears slightly dehydrated.  I believe he is taking an excessive amount of Lasix.  He is currently on 60 mg twice daily.  He is increased his insulin to 25 units a day however he is not checking his sugars.  He denies any hypoglycemic episodes.  At that time, my plan was:  Patient has spontaneously converted back to normal sinus rhythm.  He appears slightly dehydrated.  Decrease Lasix to 40 mg twice daily.  Check a BMP to monitor potassium and renal function.  Check an A1c.  Of asked the patient to weigh himself every day and if his weight goes up more than 2 to 3 pounds in a day we need to increase his Lasix back to 60 mg twice daily but I suspect that he does not need as much diuretic now that he is in normal sinus rhythm.  I also asked him to check his fasting blood sugars.  Ideally I like his fasting blood sugar to be less than 130.  If not we need to continue to uptitrate his insulin accordingly  08/22/22 Since I last saw the patient, he began continuous blood glucose monitoring.  He states that 89% of his readings are within range.  He seldom sees a blood sugar above 200.  He occasionally has a blood sugar in the 60s  but he is able to correct that with food.  He denies any hypoglycemic symptoms.  Today he is in normal sinus rhythm.  His heart rate is averaging in the 70s on exam.  He denies any chest pain or angina or shortness of breath.  He is wheezing on exam and he continues to smoke.  He denies any claudication in his legs.  He reports that both feet are numb.  He is unable to feel 10 g monofilament in either foot.  I am not able to appreciate any pulses in either the right or left foot.  His feet are cool to the touch and violaceous in color.  He has numerous varicose veins and chronic venous stasis changes to the skin of his legs. Past Medical History:  Diagnosis Date   A-fib ([place])    CHF (congestive heart failure) (HCC)    CKD (chronic kidney disease),  stage III (HCC)    Colon polyps    Diabetes mellitus type II, uncontrolled    Diabetes mellitus without complication ([place])    Hypertension    Morbid obesity ([place])    PVD (peripheral vascular disease) ([place])    right femoral artery   Sleep apnea    not on CPAP    Sleep apnea    Past Surgical History:  Procedure Laterality Date   COLONOSCOPY N/A 09/16/2015   Procedure: COLONOSCOPY;  Surgeon: Daneil Dolin, MD;  Location: [facility];  Service: Endoscopy;  Laterality: N/A;  8:15 AM - pt has c pap   ENDOVENOUS ABLATION SAPHENOUS VEIN W/ LASER Right 08/26/2017   endovenous laser ablation right greater saphenous vein by Tinnie Gens MD    stab phlebectomy  Right 01/07/2018   stab phlebectomy 10-20 incisions right leg by Tinnie Gens MD    Current Outpatient Medications on File Prior to Visit  Medication Sig Dispense Refill   Accu-Chek FastClix Lancets MISC Check BS QAM - DX: E11.9 100 each 3   apixaban (ELIQUIS) 5 MG TABS tablet Take 1 tablet (5 mg total) by mouth 2 (two) times daily. 60 tablet 3   atorvastatin (LIPITOR) 40 MG tablet TAKE 1 TABLET BY MOUTH DAILY 90 tablet 3   Blood Glucose Monitoring Suppl (ONE TOUCH ULTRA 2) w/Device KIT USETO CHECK BLOOD SUGAR EVERY MORNING 1 kit 0   Cholecalciferol (VITAMIN D3) 10 MCG (400 UNIT) tablet Take 800 Units by mouth daily.     Continuous Blood Gluc Receiver (FREESTYLE LIBRE 2 READER) DEVI For continuous blood glucose monitoring 1 each 1   Continuous Blood Gluc Sensor (FREESTYLE LIBRE 3 SENSOR) MISC Place 1 sensor on the skin every 14 days. Use to check glucose continuously 2 each 3   furosemide (LASIX) 40 MG tablet TAKE 1 TABLET BY MOUTH TWICE DAILY 180 tablet 1   glipiZIDE (GLUCOTROL) 10 MG tablet TAKE 1 TABLET BY MOUTH EVERY DAY BEFORE breakfast 90 tablet 3   GLYXAMBI 25-5 MG TABS TAKE 1 TABLET BY MOUTH DAILY 90 tablet 2   insulin glargine (LANTUS) 100 UNIT/ML injection Inject 0.15 mLs (15 Units total) into the skin daily. PLEASE INCREASE  INSULIN BY 1 UNIT DAILY UNTIL FBG IS <130, THEN HOLD AT THAT DOSE (Patient taking differently: Inject 25 Units into the skin daily. PLEASE INCREASE INSULIN BY 1 UNIT DAILY UNTIL FBG IS <130, THEN HOLD AT THAT DOSE) 10 mL 11   ipratropium-albuterol (DUONEB) 0.5-2.5 (3) MG/3ML SOLN Take 3 mLs by nebulization 3 (three) times daily. 360 mL 1   Lancets  Misc. (ACCU-CHEK FASTCLIX LANCET) KIT Check BS QAM - DX: E11.9 1 kit 0   metFORMIN (GLUCOPHAGE) 1000 MG tablet TAKE 1 TABLET BY MOUTH TWICE DAILY WITH a meal 180 tablet 3   metoprolol succinate (TOPROL-XL) 25 MG 24 hr tablet Take 0.5 tablets (12.5 mg total) by mouth daily. 45 tablet 1   Multiple Vitamin (MULTIVITAMIN) capsule Take 1 capsule by mouth daily. Mens one a day     Multiple Vitamins-Minerals (ZINC PO) Take 1 tablet by mouth daily.     ONETOUCH ULTRA test strip USE TO CHECK BLOOD SUGAR EVERY MORNING 100 strip 12   potassium chloride SA (KLOR-CON M20) 20 MEQ tablet Take 1 tablet (20 mEq total) by mouth daily. 90 tablet 3   No current facility-administered medications on file prior to visit.   No Known Allergies Social History   Socioeconomic History   Marital status: Divorced    Spouse name: Not on file   Number of children: 2   Years of education: Not on file   Highest education level: Not on file  Occupational History   Occupation: Self Employed  Tobacco Use   Smoking status: Every Day    Packs/day: 1.50    Years: 30.00    Total pack years: 45.00    Types: Cigarettes    Start date: 04/04/1958   Smokeless tobacco: Never  Vaping Use   Vaping Use: Never used  Substance and Sexual Activity   Alcohol use: Yes    Comment: occasional   Drug use: No   Sexual activity: Not on file    Comment: divorced, 2 daughters.  works Psychologist, occupational for fairs, Social research officer, government.  Other Topics Concern   Not on file  Social History Narrative   Not on file   Social Determinants of Health   Financial Resource Strain: Not on file  Food Insecurity: Not  on file  Transportation Needs: Not on file  Physical Activity: Not on file  Stress: Not on file  Social Connections: Not on file  Intimate Partner Violence: Not on file     Review of Systems  All other systems reviewed and are negative.      Objective:   Physical Exam Vitals reviewed.  Constitutional:      General: He is not in acute distress.    Appearance: Normal appearance. He is obese. He is not ill-appearing or toxic-appearing.  HENT:     Head: Normocephalic and atraumatic.     Right Ear: Tympanic membrane and ear canal normal.     Left Ear: Tympanic membrane and ear canal normal.     Nose: Nose normal. No congestion or rhinorrhea.     Mouth/Throat:     Mouth: Mucous membranes are moist.     Pharynx: Oropharynx is clear. No oropharyngeal exudate or posterior oropharyngeal erythema.  Eyes:     General: No scleral icterus.       Right eye: No discharge.        Left eye: No discharge.     Extraocular Movements: Extraocular movements intact.     Conjunctiva/sclera: Conjunctivae normal.     Pupils: Pupils are equal, round, and reactive to light.  Neck:     Vascular: No carotid bruit.  Cardiovascular:     Rate and Rhythm: Normal rate and regular rhythm.     Pulses: Normal pulses.     Heart sounds: Normal heart sounds. No murmur heard.    No friction rub. No gallop.  Pulmonary:     Effort:  Pulmonary effort is normal. No respiratory distress.     Breath sounds: No stridor. Wheezing and rhonchi present. No rales.  Chest:     Chest wall: No tenderness.  Abdominal:     General: Abdomen is flat. Bowel sounds are normal. There is no distension.     Palpations: Abdomen is soft. There is no mass.     Tenderness: There is no abdominal tenderness. There is no guarding or rebound.     Hernia: No hernia is present.  Musculoskeletal:     Cervical back: Normal range of motion and neck supple.     Right lower leg: No edema.     Left lower leg: No edema.  Lymphadenopathy:      Cervical: No cervical adenopathy.  Skin:    Coloration: Skin is not jaundiced.     Findings: No bruising, erythema, lesion or rash.  Neurological:     General: No focal deficit present.     Mental Status: He is alert and oriented to person, place, and time.     Cranial Nerves: No cranial nerve deficit.     Sensory: No sensory deficit.     Motor: No weakness.     Coordination: Coordination normal.     Gait: Gait normal.     Deep Tendon Reflexes: Reflexes normal.  Psychiatric:        Mood and Affect: Mood normal.        Behavior: Behavior normal.        Thought Content: Thought content normal.        Judgment: Judgment normal.       Assessment & Pl  Type 2 diabetes mellitus with hyperglycemia, with long-term current use of insulin (HCC) - Plan: CBC with Differential/Platelet, Lipid panel, COMPLETE METABOLIC PANEL WITH GFR, Hemoglobin A1c, Protein / Creatinine Ratio, Urine  Stage 3 chronic kidney disease, unspecified whether stage 3a or 3b CKD (HCC)  Paroxysmal atrial fibrillation (HCC)  Chronic obstructive pulmonary disease, unspecified COPD type (HCC)  Chronic congestive heart failure, unspecified heart failure type (HCC)  PAD (peripheral artery disease) (HCC) - Plan: VAS [place] ABI WITH/WO TBI I am very concerned about peripheral vascular disease based on his exam.  He had an arterial Doppler performed in 2018 which showed slight blockages in both legs.  I am concerned that that is progressed.  He denies any claudication but I will proceed with vascular ultrasound/arterial Dopplers with ABIs.  If significantly worse, I would recommend a vascular surgery consultation.  He also has significant diabetic neuropathy.  I strongly recommended smoking cessation.  He is already appropriately anticoagulated with Eliquis for his atrial fibrillation.  Today he is both in normal sinus rhythm and rate controlled.  His blood pressure is excellent.  His blood sugars sound well-controlled which I am  pleasantly surprised.  I will proceed with a CBC, CMP a lipid panel and A1c and a urine protein to creatinine ratio.  Goal A1c is less than 7.  Goal protein to creatinine ratio is less than 300.  Goal LDL cholesterol is less than 70 given his possible peripheral vascular disease

## 2022-08-23 ENCOUNTER — Other Ambulatory Visit: Payer: Self-pay

## 2022-08-23 DIAGNOSIS — E1100 Type 2 diabetes mellitus with hyperosmolarity without nonketotic hyperglycemic-hyperosmolar coma (NKHHC): Secondary | ICD-10-CM

## 2022-08-23 LAB — CBC WITH DIFFERENTIAL/PLATELET
Absolute Monocytes: 1067 cells/uL — ABNORMAL HIGH (ref 200–950)
Basophils Absolute: 55 cells/uL (ref 0–200)
Basophils Relative: 0.5 %
Eosinophils Absolute: 143 cells/uL (ref 15–500)
Eosinophils Relative: 1.3 %
HCT: 51.5 % — ABNORMAL HIGH (ref 38.5–50.0)
Hemoglobin: 17.3 g/dL — ABNORMAL HIGH (ref 13.2–17.1)
Lymphs Abs: 2321 cells/uL (ref 850–3900)
MCH: 31.7 pg (ref 27.0–33.0)
MCHC: 33.6 g/dL (ref 32.0–36.0)
MCV: 94.5 fL (ref 80.0–100.0)
MPV: 12.6 fL — ABNORMAL HIGH (ref 7.5–12.5)
Monocytes Relative: 9.7 %
Neutro Abs: 7414 cells/uL (ref 1500–7800)
Neutrophils Relative %: 67.4 %
Platelets: 159 10*3/uL (ref 140–400)
RBC: 5.45 10*6/uL (ref 4.20–5.80)
RDW: 12.5 % (ref 11.0–15.0)
Total Lymphocyte: 21.1 %
WBC: 11 10*3/uL — ABNORMAL HIGH (ref 3.8–10.8)

## 2022-08-23 LAB — COMPLETE METABOLIC PANEL WITH GFR
AG Ratio: 1.2 (calc) (ref 1.0–2.5)
ALT: 24 U/L (ref 9–46)
AST: 24 U/L (ref 10–35)
Albumin: 4.1 g/dL (ref 3.6–5.1)
Alkaline phosphatase (APISO): 68 U/L (ref 35–144)
BUN: 19 mg/dL (ref 7–25)
CO2: 27 mmol/L (ref 20–32)
Calcium: 9.5 mg/dL (ref 8.6–10.3)
Chloride: 102 mmol/L (ref 98–110)
Creat: 1.14 mg/dL (ref 0.70–1.28)
Globulin: 3.3 g/dL (calc) (ref 1.9–3.7)
Glucose, Bld: 153 mg/dL — ABNORMAL HIGH (ref 65–99)
Potassium: 4.1 mmol/L (ref 3.5–5.3)
Sodium: 141 mmol/L (ref 135–146)
Total Bilirubin: 0.5 mg/dL (ref 0.2–1.2)
Total Protein: 7.4 g/dL (ref 6.1–8.1)
eGFR: 69 mL/min/{1.73_m2} (ref >=60)

## 2022-08-23 LAB — LIPID PANEL
Cholesterol: 137 mg/dL (ref <200)
HDL: 52 mg/dL (ref >=40)
LDL Cholesterol (Calc): 66 mg/dL (calc)
Non-HDL Cholesterol (Calc): 85 mg/dL (calc) (ref <130)
Total CHOL/HDL Ratio: 2.6 (calc) (ref <5.0)
Triglycerides: 105 mg/dL (ref <150)

## 2022-08-23 LAB — HEMOGLOBIN A1C
Hgb A1c MFr Bld: 8.1 % of total Hgb — ABNORMAL HIGH (ref <5.7)
Mean Plasma Glucose: 186 mg/dL
eAG (mmol/L): 10.3 mmol/L

## 2022-08-23 LAB — PROTEIN / CREATININE RATIO, URINE
Creatinine, Urine: 37 mg/dL (ref 20–320)
Protein/Creat Ratio: 378 mg/g creat — ABNORMAL HIGH (ref 25–148)
Protein/Creatinine Ratio: 0.378 mg/mg creat — ABNORMAL HIGH (ref 0.025–0.148)
Total Protein, Urine: 14 mg/dL (ref 5–25)

## 2022-08-23 MED ORDER — INSULIN GLARGINE 100 UNIT/ML ~~LOC~~ SOLN: 25.0000 [IU] | Freq: Every day | SUBCUTANEOUS | 5 refills | Status: DC

## 2022-08-24 ENCOUNTER — Other Ambulatory Visit: Payer: Self-pay

## 2022-08-24 DIAGNOSIS — N183 Chronic kidney disease, stage 3 unspecified: Secondary | ICD-10-CM

## 2022-08-24 DIAGNOSIS — E1100 Type 2 diabetes mellitus with hyperosmolarity without nonketotic hyperglycemic-hyperosmolar coma (NKHHC): Secondary | ICD-10-CM

## 2022-08-24 MED ORDER — EMPAGLIFLOZIN 25 MG PO TABS: 25.0000 mg | ORAL_TABLET | Freq: Every day | ORAL | 3 refills | Status: DC

## 2022-08-24 MED ORDER — OZEMPIC (0.25 OR 0.5 MG/DOSE) 2 MG/3ML ~~LOC~~ SOPN: 0.5000 mg | PEN_INJECTOR | SUBCUTANEOUS | 1 refills | Status: DC

## 2022-08-27 ENCOUNTER — Ambulatory Visit (HOSPITAL_COMMUNITY)
Admission: RE | Admit: 2022-08-27 | Discharge: 2022-08-27 | Disposition: A | Discharge status: Home / Self Care | Payer: Medicare Other | Source: Ambulatory Visit | Attending: Surgery | Admitting: Surgery

## 2022-08-27 DIAGNOSIS — I739 Peripheral vascular disease, unspecified: Secondary | ICD-10-CM | POA: Insufficient documentation

## 2022-09-01 DIAGNOSIS — J449 Chronic obstructive pulmonary disease, unspecified: Secondary | ICD-10-CM | POA: Diagnosis not present

## 2022-09-01 DIAGNOSIS — J9601 Acute respiratory failure with hypoxia: Secondary | ICD-10-CM | POA: Diagnosis not present

## 2022-09-03 NOTE — Progress Notes (Signed)
Cardiology Office Note:    Date:  09/10/2022  ID:  Vaughan Basta, DOB 11-Mar-1951, MRN [id number]  PCP:  [name], MD   [place] Providers Cardiologist:  Dr. Claudina Lick  Referring MD: [name], MD   CC: Here for follow-up for Paroxysmal A-fib, bradycardia  History of Present Illness:    Steven Henry is a 71 y.o. male with a hx of the following:  PAF; on Eliquis HTN COPD, Pulmonary HTN OSA on CPAP T2DM Tobacco abuse HFpEF Aortic dilatation Obesity  Pt is a very pleasant 71 year old male with past medical history as mentioned above.  Pt was in [place] in 2023, and presented to local hospital with CHF and SOB, found to be in A-fib.  Was diuresed and left AMA.  In June 2023 presented to [facility] with increased abdominal swelling and orthopnea.  Treated with IV Lasix, echo revealed EF 65 to 70%.  Was sent home on p.o. Lasix and Eliquis.  At follow-up with PCP, was not found to be in A-fib in August 2023.  Seen by [name] on 07/30/22.  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  Ran out of medications x 4 days.  Missed dose of Eliquis the night before his visit. Denied any palpitaitons or chest pain.  Noted dyspnea on exertion with walking short distances.  Daughter had increased his Lasix to 80 mg daily, admitted to dietary indiscretions, noted to be smoking 1 pack/day.  He was originally set up for cardioversion, however he had skipped doses of his medications.  [name] discussed with him the importance of not skipping any doses of Eliquis and to follow up in 3-4 weeks to reassess. Lasix was also increased at this visit due to weight gain of 4 lbs, as well as some edema.   I saw this patient on 08/21/2022. Stated he was overall doing well and denied any acute cardiac complaints or issues. Was compliant with CPAP usage every night.  Continued to smoke. Was found to be in A-fib with slow ventricular rate, 43  bpm. 14 day monitor arranged and Toprol XL decreased to 12.5 mg daily. Recent labs by PCP showed normal potassium level.   He presents today for follow-up with me. He is doing very well.  Compliant with his medications and tolerating well.  Denies any tachycardia, palpitations, chest pain.  Does have stable, chronic shortness of breath with exertion.  This is noticed with lots of walking.  He has cut back on his smoking considerably.  Says he used to smoke 2.5 packs/day and now is down to smoking 1 pack/day.  Denies any acute bleeding, syncope, presyncope, dizziness, orthopnea, PND, or claudication. He has mailed back in his monitor recently.   Past Medical History:  Diagnosis Date   A-fib ([place])    CHF (congestive heart failure) (HCC)    CKD (chronic kidney disease), stage III (HCC)    Colon polyps    Diabetes mellitus type II, uncontrolled    Diabetes mellitus without complication ([place])    Hypertension    Morbid obesity ([place])    PVD (peripheral vascular disease) ([place])    right femoral artery   Sleep apnea    not on CPAP    Sleep apnea     Past Surgical History:  Procedure Laterality Date   COLONOSCOPY N/A 09/16/2015   Procedure: COLONOSCOPY;  Surgeon: Daneil Dolin, MD;  Location: [facility];  Service: Endoscopy;  Laterality:  N/A;  8:15 AM - pt has c pap   ENDOVENOUS ABLATION SAPHENOUS VEIN W/ LASER Right 08/26/2017   endovenous laser ablation right greater saphenous vein by Tinnie Gens MD    stab phlebectomy  Right 01/07/2018   stab phlebectomy 10-20 incisions right leg by Tinnie Gens MD     Current Medications:  Current Meds  Medication Sig   Accu-Chek FastClix Lancets MISC Check BS QAM - DX: E11.9   apixaban (ELIQUIS) 5 MG TABS tablet Take 1 tablet (5 mg total) by mouth 2 (two) times daily.   atorvastatin (LIPITOR) 40 MG tablet TAKE 1 TABLET BY MOUTH DAILY   Blood Glucose Monitoring Suppl (ONE TOUCH ULTRA 2) w/Device KIT USETO CHECK BLOOD SUGAR EVERY MORNING    Cholecalciferol (VITAMIN D3) 10 MCG (400 UNIT) tablet Take 800 Units by mouth daily.   Continuous Blood Gluc Receiver (FREESTYLE LIBRE 2 READER) DEVI For continuous blood glucose monitoring   Continuous Blood Gluc Sensor (FREESTYLE LIBRE 3 SENSOR) MISC Place 1 sensor on the skin every 14 days. Use to check glucose continuously   empagliflozin (JARDIANCE) 25 MG TABS tablet Take 1 tablet (25 mg total) by mouth daily before breakfast.   furosemide (LASIX) 40 MG tablet TAKE 1 TABLET BY MOUTH TWICE DAILY   glipiZIDE (GLUCOTROL) 10 MG tablet TAKE 1 TABLET BY MOUTH EVERY DAY BEFORE breakfast   insulin glargine (LANTUS) 100 UNIT/ML injection Inject 0.25 mLs (25 Units total) into the skin daily. PLEASE INCREASE INSULIN BY 1 UNIT DAILY UNTIL FBG IS <130, THEN HOLD AT THAT DOSE   ipratropium-albuterol (DUONEB) 0.5-2.5 (3) MG/3ML SOLN Take 3 mLs by nebulization 3 (three) times daily.   Lancets Misc. (ACCU-CHEK FASTCLIX LANCET) KIT Check BS QAM - DX: E11.9   metFORMIN (GLUCOPHAGE) 1000 MG tablet TAKE 1 TABLET BY MOUTH TWICE DAILY WITH a meal   metoprolol succinate (TOPROL-XL) 25 MG 24 hr tablet Take 0.5 tablets (12.5 mg total) by mouth daily.   Multiple Vitamin (MULTIVITAMIN) capsule Take 1 capsule by mouth daily. Mens one a day   Multiple Vitamins-Minerals (ZINC PO) Take 1 tablet by mouth daily.   ONETOUCH ULTRA test strip USE TO CHECK BLOOD SUGAR EVERY MORNING   potassium chloride SA (KLOR-CON M20) 20 MEQ tablet Take 1 tablet (20 mEq total) by mouth daily.   Semaglutide,0.25 or 0.5MG/DOS, (OZEMPIC, 0.25 OR 0.5 MG/DOSE,) 2 MG/3ML SOPN Inject 0.5 mg into the skin once a week.      Allergies:   Patient has no known allergies.   Social History   Socioeconomic History   Marital status: Divorced    Spouse name: Not on file   Number of children: 2   Years of education: Not on file   Highest education level: Not on file  Occupational History   Occupation: Self Employed  Tobacco Use   Smoking status:  Every Day    Packs/day: 1.50    Years: 30.00    Total pack years: 45.00    Types: Cigarettes    Start date: 04/04/1958   Smokeless tobacco: Never  Vaping Use   Vaping Use: Never used  Substance and Sexual Activity   Alcohol use: Yes    Comment: occasional   Drug use: No   Sexual activity: Not on file    Comment: divorced, 2 daughters.  works Psychologist, occupational for fairs, Social research officer, government.  Other Topics Concern   Not on file  Social History Narrative   Not on file   Social Determinants of Health  Financial Resource Strain: Low Risk  (09/06/2022)   Overall Financial Resource Strain (CARDIA)    Difficulty of Paying Living Expenses: Not hard at all  Food Insecurity: No Food Insecurity (09/06/2022)   Hunger Vital Sign    Worried About Running Out of Food in the Last Year: Never true    Ran Out of Food in the Last Year: Never true  Transportation Needs: No Transportation Needs (09/06/2022)   PRAPARE - Hydrologist (Medical): No    Lack of Transportation (Non-Medical): No  Physical Activity: Sufficiently Active (09/06/2022)   Exercise Vital Sign    Days of Exercise per Week: 5 days    Minutes of Exercise per Session: 30 min  Stress: No Stress Concern Present (09/06/2022)   [place]    Feeling of Stress : Not at all  Social Connections: Socially Isolated (09/06/2022)   Social Connection and Isolation Panel [NHANES]    Frequency of Communication with Friends and Family: More than three times a week    Frequency of Social Gatherings with Friends and Family: Three times a week    Attends Religious Services: Never    Active Member of Clubs or Organizations: No    Attends Archivist Meetings: Never    Marital Status: Divorced     Family History: The patient's family history includes Colon cancer in his mother; Diabetes in his mother, sister, sister, and sister; Heart disease in his  mother.  ROS:   Review of Systems  Constitutional: Negative.   HENT: Negative.    Eyes: Negative.   Respiratory:  Positive for shortness of breath. Negative for cough, hemoptysis, sputum production and wheezing.        With exertion, chronic, stable.   Cardiovascular: Negative.   Gastrointestinal: Negative.   Genitourinary: Negative.   Musculoskeletal: Negative.   Skin: Negative.        Has hx of dry skin.   Neurological: Negative.   Endo/Heme/Allergies: Negative.   Psychiatric/Behavioral: Negative.      Please see the history of present illness.    All other systems reviewed and are negative.  EKGs/Labs/Other Studies Reviewed:    The following studies were reviewed today:   EKG:  EKG is not ordered today.   2D complete echocardiogram on February 26, 2022: 1. Left ventricular ejection fraction, by estimation, is 65 to 70%. The  left ventricle has normal function. The left ventricle has no regional  wall motion abnormalities. There is mild left ventricular hypertrophy.  Left ventricular diastolic parameters  are indeterminate.   2. Right ventricular systolic function is normal. The right ventricular  size is moderately enlarged. There is moderately elevated pulmonary artery  systolic pressure. The estimated right ventricular systolic pressure is  24.5 mmHg.   3. Left atrial size was mildly dilated.   4. Right atrial size was moderately dilated.   5. The mitral valve is normal in structure. No evidence of mitral valve  regurgitation. No evidence of mitral stenosis.   6. The aortic valve is calcified. There is moderate calcification of the  aortic valve. There is mild thickening of the aortic valve. Aortic valve  regurgitation is not visualized. Aortic valve sclerosis is present, with  no evidence of aortic valve  stenosis.   7. Aortic dilatation noted. There is mild dilatation of the aortic root,  measuring 41 mm. There is mild dilatation of the ascending aorta,  measuring  41 mm.  8. The inferior vena cava is dilated in size with <50% respiratory  variability, suggesting right atrial pressure of 15 mmHg.   AAA duplex on August 04, 2020: Abdominal aorta: No evidence of an abdominal aortic aneurysm was visualized.  The largest aortic measurement was 2.7 cm.   Recent Labs: 02/25/2022: B Natriuretic Peptide 113.0 02/26/2022: TSH 1.990 03/01/2022: Magnesium 2.5 08/22/2022: ALT 24; BUN 19; Creat 1.14; Hemoglobin 17.3; Platelets 159; Potassium 4.1; Sodium 141  Recent Lipid Panel    Component Value Date/Time   CHOL 137 08/22/2022 1050   TRIG 105 08/22/2022 1050   HDL 52 08/22/2022 1050   CHOLHDL 2.6 08/22/2022 1050   VLDL 8 02/27/2022 0424   LDLCALC 66 08/22/2022 1050     Risk Assessment/Calculations:    CHA2DS2-VASc Score = 5  This indicates a 7.2% annual risk of stroke. The patient's score is based upon: CHF History: 1 HTN History: 1 Diabetes History: 1 Stroke History: 0 Vascular Disease History: 1 Age Score: 1 Gender Score: 0         Physical Exam:    VS:  BP 120/64   Pulse 88   Ht _0  (1.854 m)   Wt 295 lb 12.8 oz (134.2 kg)   SpO2 93%   BMI 39.03 kg/m     Wt Readings from Last 3 Encounters:  09/10/22 295 lb 12.8 oz (134.2 kg)  09/06/22 (!) 301 lb (136.5 kg)  08/22/22 (!) 301 lb 9.6 oz (136.8 kg)     GEN: Obese, 71 y.o. Caucasian male in no acute distress HEENT: Normal NECK: No JVD; No carotid bruits CARDIAC: S1/S2, irregular rhythm and regular rate, no murmurs, rubs, gallops; 2+ radial pulses, strong and equal bilaterally; 1+ PT pulses, equal bilaterally RESPIRATORY:  Clear and diminished to auscultation without rales or rhonchi, inspiratory wheezing noted bilaterally MUSCULOSKELETAL:  No edema; No deformity  SKIN: Warm and dry, scattered dry skin/psoriasis generalized, skin discoloration along bilateral lower extremities NEUROLOGIC:  Alert and oriented x 3 PSYCHIATRIC:  Normal, pleasant affect   ASSESSMENT:    1. Atrial  fibrillation, unspecified type ([place])   2. Essential hypertension, benign   3. Chronic heart failure with preserved ejection fraction ([place])   4. Tobacco abuse   5. Chronic obstructive pulmonary disease, unspecified COPD type ([place])   6. Pulmonary hypertension, unspecified ([place])   7. SOB (shortness of breath)   8. Wheezing   9. OSA on CPAP   10. Aortic dilatation (HCC)   11. Class 2 obesity due to excess calories with body mass index (BMI) of 39.0 to 39.9 in adult, unspecified whether serious comorbidity present    PLAN:    In order of problems listed above:  A-fib  Denies any palpitations or fast heart rates.  Twelve-lead EKG at last office visit revealed A-fib with slow ventricular rate, 43 bpm, chronic right bundle branch block, otherwise no acute ischemic changes. Bradycardia has resolved, today HR is 88.  14 day Zio results are pending. ED precautions discussed. Continue Eliquis 5 mg BID, currently on appropriate dosage. Continue Toprol XL. Heart healthy diet and regular cardiovascular exercise as tolerated encouraged.   HTN  BP today 120/64.  Discussed to monitor BP at home at least 2 hours after medications and sitting for 5-10 minutes. Continue current medication regimen. Heart healthy diet and regular cardiovascular exercise encouraged.   Chronic HFpEF TTE in June 2023 revealed EF 65 to 70%, no RWMA, mild LVH. Euvolemic and well compensated on exam.  Continue current medication  regimen. Low sodium diet, fluid restriction <2L, and daily weights encouraged. Educated to contact our office for weight gain of 2 lbs overnight or 5 lbs in one week. Heart healthy diet and regular cardiovascular exercise as tolerated encouraged.    Tobacco abuse Continues to smoke around 1 pack/day.  Discussed and encouraged smoking cessation, says he's going to try Nicotine patches he has at home. Discussed to let our office know what mg they are as he should begin on 21 mg daily dose of Nicotine patch. He  verbalized understanding.   COPD, pulmonary HTN, dyspnea on exertion, wheezing Denies any recent shortness of breath or acute exacerbations of COPD, does admit to chronic, stable DOE. Diminished, inspiratory wheeze on exam, not short of breath on exam. TTE in June 2023 revealed moderately elevated PASP, estimated RVSP was 56.7 mmHg, most likely due to history of COPD and OSA. Will refer to pulmonology at this time as he hasn't seen Pulmonology in years.    OSA on CPAP Encouraged continued compliance nightly. Continue to follow with PCP.  7. Aortic dilatation Noted on echocardiogram in June 2023.  Mild dilatation of aortic root, measuring 41 mm.  There was mild dilatation of ascending aorta, measuring 41 mm.  Recommend arranging repeat echocardiogram in June 2024 for monitoring.  8. Class 2 Obesity BMI 39.26. Weight loss via diet and exercise encouraged. Discussed the impact being overweight would have on cardiovascular risk. Heart healthy diet and regular cardiovascular exercise encouraged.   8. Disposition: Requesting to establish care in [place], previously has seen Dr. Harrington Challenger.  Establish care with Dr. Dellia Cloud in 4 months or sooner if anything changes.    Medication Adjustments/Labs and Tests Ordered: Current medicines are reviewed at length with the patient today.  Concerns regarding medicines are outlined above.  Orders Placed This Encounter  Procedures   Ambulatory referral to Pulmonology   No orders of the defined types were placed in this encounter.   Patient Instructions  Medication Instructions:  Your physician recommends that you continue on your current medications as directed. Please refer to the Current Medication list given to you today.  Labwork: none  Testing/Procedures: none  Follow-Up: Your physician recommends that you schedule a follow-up appointment in: 4 months with Dr. Dellia Cloud  Any Other Special Instructions Will Be Listed Below (If Applicable). You  have been referred to [facility] Pulmonology  If you need a refill on your cardiac medications before your next appointment, please call your pharmacy.    SignedFinis Bud, NP  09/10/2022 12:55 PM    [place]

## 2022-09-06 ENCOUNTER — Ambulatory Visit (INDEPENDENT_AMBULATORY_CARE_PROVIDER_SITE_OTHER): Payer: Medicare Other

## 2022-09-06 VITALS — Ht 73.0 in | Wt 301.0 lb

## 2022-09-06 DIAGNOSIS — Z0000 Encounter for general adult medical examination without abnormal findings: Secondary | ICD-10-CM

## 2022-09-06 NOTE — Progress Notes (Signed)
Subjective:   Steven Henry is a 71 y.o. male who presents for Medicare Annual/Subsequent preventive examination.  I connected with  Vaughan Basta on 09/06/22 by a audio enabled telemedicine application and verified that I am speaking with the correct person using two identifiers.  Patient Location: Home  Provider Location: Office/Clinic  I discussed the limitations of evaluation and management by telemedicine. The patient expressed understanding and agreed to proceed.  Review of Systems     Cardiac Risk Factors include: advanced age (>49mn, >>56women);smoking/ tobacco exposure;diabetes mellitus;dyslipidemia;hypertension;male gender     Objective:    Today's Vitals   09/06/22 1415  Weight: (!) 301 lb (136.5 kg)  Height: _0  (1.854 m)   Body mass index is 39.71 kg/m.     09/06/2022    2:20 PM 02/25/2022   10:00 PM 02/25/2022    8:00 PM 08/01/2020    8:22 AM 03/29/2017    3:09 PM 09/16/2015    7:19 AM  Advanced Directives  Does Patient Have a Medical Advance Directive? _1  No  Would patient like information on creating a medical advance directive? Yes (MAU/Ambulatory/Procedural Areas - Information given) No - Patient declined    No - patient declined information    Current Medications (verified) Outpatient Encounter Medications as of 09/06/2022  Medication Sig   Accu-Chek FastClix Lancets MISC Check BS QAM - DX: E11.9   apixaban (ELIQUIS) 5 MG TABS tablet Take 1 tablet (5 mg total) by mouth 2 (two) times daily.   atorvastatin (LIPITOR) 40 MG tablet TAKE 1 TABLET BY MOUTH DAILY   Blood Glucose Monitoring Suppl (ONE TOUCH ULTRA 2) w/Device KIT USETO CHECK BLOOD SUGAR EVERY MORNING   Cholecalciferol (VITAMIN D3) 10 MCG (400 UNIT) tablet Take 800 Units by mouth daily.   Continuous Blood Gluc Receiver (FREESTYLE LIBRE 2 READER) DEVI For continuous blood glucose monitoring   Continuous Blood Gluc Sensor (FREESTYLE LIBRE 3 SENSOR) MISC Place 1 sensor on  the skin every 14 days. Use to check glucose continuously   empagliflozin (JARDIANCE) 25 MG TABS tablet Take 1 tablet (25 mg total) by mouth daily before breakfast.   furosemide (LASIX) 40 MG tablet TAKE 1 TABLET BY MOUTH TWICE DAILY   glipiZIDE (GLUCOTROL) 10 MG tablet TAKE 1 TABLET BY MOUTH EVERY DAY BEFORE breakfast   insulin glargine (LANTUS) 100 UNIT/ML injection Inject 0.25 mLs (25 Units total) into the skin daily. PLEASE INCREASE INSULIN BY 1 UNIT DAILY UNTIL FBG IS <130, THEN HOLD AT THAT DOSE   ipratropium-albuterol (DUONEB) 0.5-2.5 (3) MG/3ML SOLN Take 3 mLs by nebulization 3 (three) times daily.   Lancets Misc. (ACCU-CHEK FASTCLIX LANCET) KIT Check BS QAM - DX: E11.9   metFORMIN (GLUCOPHAGE) 1000 MG tablet TAKE 1 TABLET BY MOUTH TWICE DAILY WITH a meal   metoprolol succinate (TOPROL-XL) 25 MG 24 hr tablet Take 0.5 tablets (12.5 mg total) by mouth daily.   Multiple Vitamin (MULTIVITAMIN) capsule Take 1 capsule by mouth daily. Mens one a day   Multiple Vitamins-Minerals (ZINC PO) Take 1 tablet by mouth daily.   ONETOUCH ULTRA test strip USE TO CHECK BLOOD SUGAR EVERY MORNING   potassium chloride SA (KLOR-CON M20) 20 MEQ tablet Take 1 tablet (20 mEq total) by mouth daily.   Semaglutide,0.25 or 0.5MG/DOS, (OZEMPIC, 0.25 OR 0.5 MG/DOSE,) 2 MG/3ML SOPN Inject 0.5 mg into the skin once a week.   No facility-administered encounter medications on file as of 09/06/2022.    Allergies (  verified) Patient has no known allergies.   History: Past Medical History:  Diagnosis Date   A-fib ([place])    CHF (congestive heart failure) (HCC)    CKD (chronic kidney disease), stage III (HCC)    Colon polyps    Diabetes mellitus type II, uncontrolled    Diabetes mellitus without complication ([place])    Hypertension    Morbid obesity ([place])    PVD (peripheral vascular disease) ([place])    right femoral artery   Sleep apnea    not on CPAP    Sleep apnea    Past Surgical History:  Procedure Laterality  Date   COLONOSCOPY N/A 09/16/2015   Procedure: COLONOSCOPY;  Surgeon: Daneil Dolin, MD;  Location: [facility];  Service: Endoscopy;  Laterality: N/A;  8:15 AM - pt has c pap   ENDOVENOUS ABLATION SAPHENOUS VEIN W/ LASER Right 08/26/2017   endovenous laser ablation right greater saphenous vein by Tinnie Gens MD    stab phlebectomy  Right 01/07/2018   stab phlebectomy 10-20 incisions right leg by Tinnie Gens MD    Family History  Problem Relation Age of Onset   Colon cancer Mother    Diabetes Mother    Heart disease Mother    Diabetes Sister    Diabetes Sister    Diabetes Sister    Social History   Socioeconomic History   Marital status: Divorced    Spouse name: Not on file   Number of children: 2   Years of education: Not on file   Highest education level: Not on file  Occupational History   Occupation: Self Employed  Tobacco Use   Smoking status: Every Day    Packs/day: 1.50    Years: 30.00    Total pack years: 45.00    Types: Cigarettes    Start date: 04/04/1958   Smokeless tobacco: Never  Vaping Use   Vaping Use: Never used  Substance and Sexual Activity   Alcohol use: Yes    Comment: occasional   Drug use: No   Sexual activity: Not on file    Comment: divorced, 2 daughters.  works Psychologist, occupational for fairs, Social research officer, government.  Other Topics Concern   Not on file  Social History Narrative   Not on file   Social Determinants of Health   Financial Resource Strain: Low Risk  (09/06/2022)   Overall Financial Resource Strain (CARDIA)    Difficulty of Paying Living Expenses: Not hard at all  Food Insecurity: No Food Insecurity (09/06/2022)   Hunger Vital Sign    Worried About Running Out of Food in the Last Year: Never true    Ran Out of Food in the Last Year: Never true  Transportation Needs: No Transportation Needs (09/06/2022)   PRAPARE - Hydrologist (Medical): No    Lack of Transportation (Non-Medical): No  Physical Activity:  Sufficiently Active (09/06/2022)   Exercise Vital Sign    Days of Exercise per Week: 5 days    Minutes of Exercise per Session: 30 min  Stress: No Stress Concern Present (09/06/2022)   [place]    Feeling of Stress : Not at all  Social Connections: Socially Isolated (09/06/2022)   Social Connection and Isolation Panel [NHANES]    Frequency of Communication with Friends and Family: More than three times a week    Frequency of Social Gatherings with Friends and Family: Three times a week    Attends  Religious Services: Never    Active Member of Clubs or Organizations: No    Attends Archivist Meetings: Never    Marital Status: Divorced    Tobacco Counseling Ready to quit: Not Answered Counseling given: Not Answered   Clinical Intake:  Pre-visit preparation completed: Yes  Pain : No/denies pain     Diabetes: Yes CBG done?: No Did pt. bring in CBG monitor from home?: No  How often do you need to have someone help you when you read instructions, pamphlets, or other written materials from your doctor or pharmacy?: 1 - Never  Diabetic?Yes Nutrition Risk Assessment:  Has the patient had any N/V/D within the last 2 months?  No  Does the patient have any non-healing wounds?  No  Has the patient had any unintentional weight loss or weight gain?  No   Diabetes:  Is the patient diabetic?  Yes  If diabetic, was a CBG obtained today?  No  Did the patient bring in their glucometer from home?  No  How often do you monitor your CBG's? Has Libre.   Financial Strains and Diabetes Management:  Are you having any financial strains with the device, your supplies or your medication? No .  Does the patient want to be seen by Chronic Care Management for management of their diabetes?  No  Would the patient like to be referred to a Nutritionist or for Diabetic Management?  No   Diabetic Exams:  Diabetic Eye  Exam: Completed will [facility]  Diabetic Foot Exam: Completed 08/22/22   Interpreter Needed?: No  Information entered by :: Denman George LPN   Activities of Daily Living    09/06/2022    2:20 PM 09/05/2022    5:02 PM  In your present state of health, do you have any difficulty performing the following activities:  Hearing? 0 0  Vision? 0 0  Difficulty concentrating or making decisions? 0 0  Walking or climbing stairs? 0 1  Dressing or bathing? 0 0  Doing errands, shopping? 0 0  Preparing Food and eating ? N N  Using the Toilet? N N  In the past six months, have you accidently leaked urine? N Y  Do you have problems with loss of bowel control? N N  Managing your Medications? N Y  Managing your Finances? N N  Housekeeping or managing your Housekeeping? N N    Patient Care Team: [name], MD as PCP - General (Family Medicine) Fay Records, MD as PCP - Cardiology (Cardiology)  Indicate any recent Medical Services you may have received from other than [facility] providers in the past year (date may be approximate).     Assessment:   This is a routine wellness examination for Caz.  Hearing/Vision screen Hearing Screening - Comments:: No concerns at this time   Vision Screening - Comments:: Wears rx glasses - up to date with routine eye exams with [facility] Ledell Noss)   Dietary issues and exercise activities discussed: Current Exercise Habits: Home exercise routine, Type of exercise: walking, Time (Minutes): 30, Frequency (Times/Week): 5, Weekly Exercise (Minutes/Week): 150, Intensity: Mild   Goals Addressed   None   Depression Screen    09/06/2022    2:18 PM 08/22/2022   10:32 AM 11/14/2020   10:59 AM 08/01/2020    8:22 AM 06/16/2018   11:42 AM 01/14/2017    8:00 AM  PHQ 2/9 Scores  PHQ - 2 Score 0 0 0 0 0  0    Fall Risk    09/06/2022    2:17 PM 09/05/2022    5:02 PM 08/22/2022   10:32 AM 11/18/2020    8:19 AM 11/14/2020   10:59 AM   Fall Risk   Falls in the past year? 1 1 0 0   Number falls in past yr: 1 1 0 0 0  Injury with Fall? 0 1 0 0 0  Risk for fall due to : History of fall(s)  No Fall Risks    Follow up Falls evaluation completed;Education provided;Falls prevention discussed  Falls prevention discussed      FALL RISK PREVENTION PERTAINING TO THE HOME:  Any stairs in or around the home? Yes  If so, are there any without handrails? No  Home free of loose throw rugs in walkways, pet beds, electrical cords, etc? Yes  Adequate lighting in your home to reduce risk of falls? Yes   ASSISTIVE DEVICES UTILIZED TO PREVENT FALLS:  Life alert? No  Use of a cane, walker or w/c? No  Grab bars in the bathroom? Yes  Shower chair or bench in shower? No  Elevated toilet seat or a handicapped toilet? Yes   TIMED UP AND GO:  Was the test performed? No .  Length of time to ambulate 10 feet: telephonic visit    Cognitive Function:        09/06/2022    2:20 PM  6CIT Screen  What Year? 0 points  What month? 0 points  What time? 0 points  Count back from 20 0 points  Months in reverse 0 points  Repeat phrase 0 points  Total Score 0 points    Immunizations Immunization History  Administered Date(s) Administered   Tdap 02/03/2019, 02/03/2019    TDAP status: Up to date  Flu Vaccine status: Declined, Education has been provided regarding the importance of this vaccine but patient still declined. Advised may receive this vaccine at local pharmacy or Health Dept. Aware to provide a copy of the vaccination record if obtained from local pharmacy or Health Dept. Verbalized acceptance and understanding.  Pneumococcal vaccine status: Declined,  Education has been provided regarding the importance of this vaccine but patient still declined. Advised may receive this vaccine at local pharmacy or Health Dept. Aware to provide a copy of the vaccination record if obtained from local pharmacy or Health Dept. Verbalized  acceptance and understanding.   Covid-19 vaccine status: Information provided on how to obtain vaccines.   Qualifies for Shingles Vaccine? Yes   Zostavax completed No   Shingrix Completed?: No.    Education has been provided regarding the importance of this vaccine. Patient has been advised to call insurance company to determine out of pocket expense if they have not yet received this vaccine. Advised may also receive vaccine at local pharmacy or Health Dept. Verbalized acceptance and understanding.  Screening Tests Health Maintenance  Topic Date Due   COVID-19 Vaccine (1) 09/07/2022 (Originally 04/04/1956)   Zoster Vaccines- Shingrix (1 of 2) 11/22/2022 (Originally 04/04/1970)   INFLUENZA VACCINE  12/23/2022 (Originally 04/24/2022)   Lung Cancer Screening  08/23/2023 (Originally 04/04/2001)   Pneumonia Vaccine 36+ Years old (1 - PCV) 08/23/2023 (Originally 04/04/1957)   OPHTHALMOLOGY EXAM  08/23/2023 (Originally 11/29/2021)   HEMOGLOBIN A1C  02/20/2023   Diabetic kidney evaluation - eGFR measurement  08/23/2023   Diabetic kidney evaluation - Urine ACR  08/23/2023   FOOT EXAM  08/23/2023   Medicare Annual Wellness (AWV)  09/07/2023  COLONOSCOPY (Pts 45-41yr Insurance coverage will need to be confirmed)  09/15/2025   DTaP/Tdap/Td (3 - Td or Tdap) 02/02/2029   Hepatitis C Screening  Completed   HPV VACCINES  Aged Out    Health Maintenance  There are no preventive care reminders to display for this patient.  Colorectal cancer screening: Type of screening: Colonoscopy. Completed 09/16/15. Repeat every 10 years  Lung Cancer Screening: (Low Dose CT Chest recommended if Age 71-80years, 30 pack-year currently smoking OR have quit w/in 15years.) does qualify.   Lung Cancer Screening Referral: patient declines at this time  Additional Screening:  Hepatitis C Screening: does qualify; Completed 02/26/22  Vision Screening: Recommended annual ophthalmology exams for early detection of glaucoma  and other disorders of the eye. Is the patient up to date with their annual eye exam?  Yes  Who is the provider or what is the name of the office in which the patient attends annual eye exams? [facility]. ELedell Noss If pt is not established with a provider, would they like to be referred to a provider to establish care? No .   Dental Screening: Recommended annual dental exams for proper oral hygiene  Community Resource Referral / Chronic Care Management: CRR required this visit?  No   CCM required this visit?  No      Plan:     I have personally reviewed and noted the following in the patient's chart:   Medical and social history Use of alcohol, tobacco or illicit drugs  Current medications and supplements including opioid prescriptions. Patient is not currently taking opioid prescriptions. Functional ability and status Nutritional status Physical activity Advanced directives List of other physicians Hospitalizations, surgeries, and ER visits in previous 12 months Vitals Screenings to include cognitive, depression, and falls Referrals and appointments  In addition, I have reviewed and discussed with patient certain preventive protocols, quality metrics, and best practice recommendations. A written personalized care plan for preventive services as well as general preventive health recommendations were provided to patient.     SVanetta Mulders LPN   [id number]  Due to this being a virtual visit, the after visit summary with patients personalized plan was offered to patient via mail or my-chart. Per request, patient was mailed a copy of AVS  Nurse Notes: No concerns

## 2022-09-06 NOTE — Patient Instructions (Signed)
Steven Henry , Thank you for taking time to come for your Medicare Wellness Visit. I appreciate your ongoing commitment to your health goals. Please review the following plan we discussed and let me know if I can assist you in the future.   These are the goals we discussed:  Goals   None     This is a list of the screening recommended for you and due dates:  Health Maintenance  Topic Date Due   COVID-19 Vaccine (1) 09/07/2022*   Zoster (Shingles) Vaccine (1 of 2) 11/22/2022*   Flu Shot  12/23/2022*   Screening for Lung Cancer  08/23/2023*   Pneumonia Vaccine (1 - PCV) 08/23/2023*   Eye exam for diabetics  08/23/2023*   Hemoglobin A1C  02/20/2023   Yearly kidney function blood test for diabetes  08/23/2023   Yearly kidney health urinalysis for diabetes  08/23/2023   Complete foot exam   08/23/2023   Medicare Annual Wellness Visit  09/07/2023   Colon Cancer Screening  09/15/2025   DTaP/Tdap/Td vaccine (3 - Td or Tdap) 02/02/2029   Hepatitis C Screening: USPSTF Recommendation to screen - Ages 10-79 yo.  Completed   HPV Vaccine  Aged Out  *Topic was postponed. The date shown is not the original due date.    Advanced directives: Advance directive discussed with you today. I have provided a copy for you to complete at home and have notarized. Once this is complete please bring a copy in to our office so we can scan it into your chart.   Conditions/risks identified: Aim for 30 minutes of exercise or brisk walking, 6-8 glasses of water, and 5 servings of fruits and vegetables each day.   Next appointment: Follow up in one year for your annual wellness visit.   Preventive Care 28 Years and Older, Male  Preventive care refers to lifestyle choices and visits with your health care provider that can promote health and wellness. What does preventive care include? A yearly physical exam. This is also called an annual well check. Dental exams once or twice a year. Routine eye exams. Ask your  health care provider how often you should have your eyes checked. Personal lifestyle choices, including: Daily care of your teeth and gums. Regular physical activity. Eating a healthy diet. Avoiding tobacco and drug use. Limiting alcohol use. Practicing safe sex. Taking low doses of aspirin every day. Taking vitamin and mineral supplements as recommended by your health care provider. What happens during an annual well check? The services and screenings done by your health care provider during your annual well check will depend on your age, overall health, lifestyle risk factors, and family history of disease. Counseling  Your health care provider may ask you questions about your: Alcohol use. Tobacco use. Drug use. Emotional well-being. Home and relationship well-being. Sexual activity. Eating habits. History of falls. Memory and ability to understand (cognition). Work and work Statistician. Screening  You may have the following tests or measurements: Height, weight, and BMI. Blood pressure. Lipid and cholesterol levels. These may be checked every 5 years, or more frequently if you are over 38 years old. Skin check. Lung cancer screening. You may have this screening every year starting at age 48 if you have a 30-pack-year history of smoking and currently smoke or have quit within the past 15 years. Fecal occult blood test (FOBT) of the stool. You may have this test every year starting at age 2. Flexible sigmoidoscopy or colonoscopy. You may have a  sigmoidoscopy every 5 years or a colonoscopy every 10 years starting at age 39. Prostate cancer screening. Recommendations will vary depending on your family history and other risks. Hepatitis C blood test. Hepatitis B blood test. Sexually transmitted disease (STD) testing. Diabetes screening. This is done by checking your blood sugar (glucose) after you have not eaten for a while (fasting). You may have this done every 1-3  years. Abdominal aortic aneurysm (AAA) screening. You may need this if you are a current or former smoker. Osteoporosis. You may be screened starting at age 57 if you are at high risk. Talk with your health care provider about your test results, treatment options, and if necessary, the need for more tests. Vaccines  Your health care provider may recommend certain vaccines, such as: Influenza vaccine. This is recommended every year. Tetanus, diphtheria, and acellular pertussis (Tdap, Td) vaccine. You may need a Td booster every 10 years. Zoster vaccine. You may need this after age 89. Pneumococcal 13-valent conjugate (PCV13) vaccine. One dose is recommended after age 6. Pneumococcal polysaccharide (PPSV23) vaccine. One dose is recommended after age 32. Talk to your health care provider about which screenings and vaccines you need and how often you need them. This information is not intended to replace advice given to you by your health care provider. Make sure you discuss any questions you have with your health care provider. Document Released: 10/07/2015 Document Revised: 05/30/2016 Document Reviewed: 07/12/2015 Elsevier Interactive Patient Education  2017 [place] Prevention in the Home Falls can cause injuries. They can happen to people of all ages. There are many things you can do to make your home safe and to help prevent falls. What can I do on the outside of my home? Regularly fix the edges of walkways and driveways and fix any cracks. Remove anything that might make you trip as you walk through a door, such as a raised step or threshold. Trim any bushes or trees on the path to your home. Use bright outdoor lighting. Clear any walking paths of anything that might make someone trip, such as rocks or tools. Regularly check to see if handrails are loose or broken. Make sure that both sides of any steps have handrails. Any raised decks and porches should have guardrails on the  edges. Have any leaves, snow, or ice cleared regularly. Use sand or salt on walking paths during winter. Clean up any spills in your garage right away. This includes oil or grease spills. What can I do in the bathroom? Use night lights. Install grab bars by the toilet and in the tub and shower. Do not use towel bars as grab bars. Use non-skid mats or decals in the tub or shower. If you need to sit down in the shower, use a plastic, non-slip stool. Keep the floor dry. Clean up any water that spills on the floor as soon as it happens. Remove soap buildup in the tub or shower regularly. Attach bath mats securely with double-sided non-slip rug tape. Do not have throw rugs and other things on the floor that can make you trip. What can I do in the bedroom? Use night lights. Make sure that you have a light by your bed that is easy to reach. Do not use any sheets or blankets that are too big for your bed. They should not hang down onto the floor. Have a firm chair that has side arms. You can use this for support while you get dressed. Do not  have throw rugs and other things on the floor that can make you trip. What can I do in the kitchen? Clean up any spills right away. Avoid walking on wet floors. Keep items that you use a lot in easy-to-reach places. If you need to reach something above you, use a strong step stool that has a grab bar. Keep electrical cords out of the way. Do not use floor polish or wax that makes floors slippery. If you must use wax, use non-skid floor wax. Do not have throw rugs and other things on the floor that can make you trip. What can I do with my stairs? Do not leave any items on the stairs. Make sure that there are handrails on both sides of the stairs and use them. Fix handrails that are broken or loose. Make sure that handrails are as long as the stairways. Check any carpeting to make sure that it is firmly attached to the stairs. Fix any carpet that is loose or  worn. Avoid having throw rugs at the top or bottom of the stairs. If you do have throw rugs, attach them to the floor with carpet tape. Make sure that you have a light switch at the top of the stairs and the bottom of the stairs. If you do not have them, ask someone to add them for you. What else can I do to help prevent falls? Wear shoes that: Do not have high heels. Have rubber bottoms. Are comfortable and fit you well. Are closed at the toe. Do not wear sandals. If you use a stepladder: Make sure that it is fully opened. Do not climb a closed stepladder. Make sure that both sides of the stepladder are locked into place. Ask someone to hold it for you, if possible. Clearly mark and make sure that you can see: Any grab bars or handrails. First and last steps. Where the edge of each step is. Use tools that help you move around (mobility aids) if they are needed. These include: Canes. Walkers. Scooters. Crutches. Turn on the lights when you go into a dark area. Replace any light bulbs as soon as they burn out. Set up your furniture so you have a clear path. Avoid moving your furniture around. If any of your floors are uneven, fix them. If there are any pets around you, be aware of where they are. Review your medicines with your doctor. Some medicines can make you feel dizzy. This can increase your chance of falling. Ask your doctor what other things that you can do to help prevent falls. This information is not intended to replace advice given to you by your health care provider. Make sure you discuss any questions you have with your health care provider. Document Released: 07/07/2009 Document Revised: 02/16/2016 Document Reviewed: 10/15/2014 Elsevier Interactive Patient Education  2017 [employer].

## 2022-09-10 ENCOUNTER — Ambulatory Visit: Payer: Medicare Other | Attending: Nurse Practitioner | Admitting: Nurse Practitioner

## 2022-09-10 ENCOUNTER — Encounter: Payer: Self-pay | Admitting: Nurse Practitioner

## 2022-09-10 VITALS — BP 120/64 | HR 88 | Ht 73.0 in | Wt 295.8 lb

## 2022-09-10 DIAGNOSIS — G4733 Obstructive sleep apnea (adult) (pediatric): Secondary | ICD-10-CM

## 2022-09-10 DIAGNOSIS — J449 Chronic obstructive pulmonary disease, unspecified: Secondary | ICD-10-CM

## 2022-09-10 DIAGNOSIS — I10 Essential (primary) hypertension: Secondary | ICD-10-CM | POA: Diagnosis not present

## 2022-09-10 DIAGNOSIS — R0602 Shortness of breath: Secondary | ICD-10-CM

## 2022-09-10 DIAGNOSIS — I4891 Unspecified atrial fibrillation: Secondary | ICD-10-CM | POA: Diagnosis not present

## 2022-09-10 DIAGNOSIS — I5032 Chronic diastolic (congestive) heart failure: Secondary | ICD-10-CM | POA: Diagnosis not present

## 2022-09-10 DIAGNOSIS — I272 Pulmonary hypertension, unspecified: Secondary | ICD-10-CM

## 2022-09-10 DIAGNOSIS — E6609 Other obesity due to excess calories: Secondary | ICD-10-CM

## 2022-09-10 DIAGNOSIS — Z720 Tobacco use: Secondary | ICD-10-CM | POA: Diagnosis not present

## 2022-09-10 DIAGNOSIS — R062 Wheezing: Secondary | ICD-10-CM

## 2022-09-10 DIAGNOSIS — Z6839 Body mass index (BMI) 39.0-39.9, adult: Secondary | ICD-10-CM

## 2022-09-10 DIAGNOSIS — I77819 Aortic ectasia, unspecified site: Secondary | ICD-10-CM

## 2022-09-10 NOTE — Patient Instructions (Addendum)
Medication Instructions:  Your physician recommends that you continue on your current medications as directed. Please refer to the Current Medication list given to you today.  Labwork: none  Testing/Procedures: none  Follow-Up: Your physician recommends that you schedule a follow-up appointment in: 4 months with Dr. Dellia Cloud  Any Other Special Instructions Will Be Listed Below (If Applicable). You have been referred to [facility] Pulmonology  If you need a refill on your cardiac medications before your next appointment, please call your pharmacy.

## 2022-09-11 DIAGNOSIS — R001 Bradycardia, unspecified: Secondary | ICD-10-CM | POA: Diagnosis not present

## 2022-09-12 ENCOUNTER — Telehealth: Payer: Self-pay | Admitting: Internal Medicine

## 2022-09-12 NOTE — Telephone Encounter (Signed)
Spoke to patient about monitor  Afib   Rates on average controlled  Has several pauses    Longest 9.8 sec at 10:20 in aM   He says he was not sleeping at that time  NO dizziness  NO syncope      Stop Toprol XL  Reviewed with W Camnitz  Pt will need pacer   Told him someone would call him

## 2022-09-12 NOTE — Addendum Note (Signed)
Addended by: Merlene Laughter on: 09/12/2022 04:22 PM   Modules accepted: Orders

## 2022-09-13 ENCOUNTER — Telehealth: Payer: Self-pay | Admitting: Internal Medicine

## 2022-09-13 NOTE — Telephone Encounter (Signed)
Spoke with pt and reviewed device implant instruction letter and sent to pt through his MyChart.  See letter for complete instructions.  Pt verbalizes understanding and agrees with current plan.

## 2022-09-13 NOTE — Pre-Procedure Instructions (Signed)
Attempted to call patient regarding instructions.  Time has been moved need you to be at hospital tomorrow @ 1100 Nothing to eat or drink after midnight No meds AM of procedure Responsible person to drive you home and stay with you for 24 hrs Wash with special soap night before and morning of procedure If on anti-coagulant drug instructions Eliquis-  should be holding it

## 2022-09-13 NOTE — Telephone Encounter (Signed)
Spoke to patient     Plan for Pacemaker insertion tomorrow with Olin Pia Monitor showed  9.8 second pause  Recomm: No more Eliquis today or tomorrow   Hold diabetic meds tomorrow Nothing to eat after 6 am   Be at hospital at 12:30 for 3PM procedure  Patient will get labs tomorrow

## 2022-09-13 NOTE — Telephone Encounter (Signed)
Sending to Dr. Caryl Comes nurse to follow up with pre op instructions.

## 2022-09-14 ENCOUNTER — Other Ambulatory Visit: Payer: Self-pay

## 2022-09-14 ENCOUNTER — Encounter (HOSPITAL_COMMUNITY)
Admission: RE | Disposition: A | Discharge status: Home / Self Care | Payer: Self-pay | Source: Home / Self Care | Attending: Internal Medicine

## 2022-09-14 ENCOUNTER — Ambulatory Visit (HOSPITAL_COMMUNITY)
Admission: RE | Admit: 2022-09-14 | Discharge: 2022-09-14 | Disposition: A | Discharge status: Home / Self Care | Payer: Medicare Other | Attending: Internal Medicine | Admitting: Internal Medicine

## 2022-09-14 ENCOUNTER — Ambulatory Visit (HOSPITAL_COMMUNITY): Payer: Medicare Other

## 2022-09-14 ENCOUNTER — Other Ambulatory Visit: Payer: Self-pay | Admitting: Physician Assistant

## 2022-09-14 DIAGNOSIS — I5032 Chronic diastolic (congestive) heart failure: Secondary | ICD-10-CM | POA: Diagnosis not present

## 2022-09-14 DIAGNOSIS — F17210 Nicotine dependence, cigarettes, uncomplicated: Secondary | ICD-10-CM | POA: Diagnosis not present

## 2022-09-14 DIAGNOSIS — I4430 Unspecified atrioventricular block: Secondary | ICD-10-CM

## 2022-09-14 DIAGNOSIS — D751 Secondary polycythemia: Secondary | ICD-10-CM

## 2022-09-14 DIAGNOSIS — I272 Pulmonary hypertension, unspecified: Secondary | ICD-10-CM | POA: Diagnosis not present

## 2022-09-14 DIAGNOSIS — Z7901 Long term (current) use of anticoagulants: Secondary | ICD-10-CM | POA: Diagnosis not present

## 2022-09-14 DIAGNOSIS — I130 Hypertensive heart and chronic kidney disease with heart failure and stage 1 through stage 4 chronic kidney disease, or unspecified chronic kidney disease: Secondary | ICD-10-CM | POA: Diagnosis not present

## 2022-09-14 DIAGNOSIS — Z794 Long term (current) use of insulin: Secondary | ICD-10-CM | POA: Insufficient documentation

## 2022-09-14 DIAGNOSIS — E1122 Type 2 diabetes mellitus with diabetic chronic kidney disease: Secondary | ICD-10-CM | POA: Insufficient documentation

## 2022-09-14 DIAGNOSIS — N183 Chronic kidney disease, stage 3 unspecified: Secondary | ICD-10-CM | POA: Diagnosis not present

## 2022-09-14 DIAGNOSIS — I4819 Other persistent atrial fibrillation: Secondary | ICD-10-CM | POA: Diagnosis not present

## 2022-09-14 DIAGNOSIS — R0609 Other forms of dyspnea: Secondary | ICD-10-CM | POA: Insufficient documentation

## 2022-09-14 DIAGNOSIS — G4733 Obstructive sleep apnea (adult) (pediatric): Secondary | ICD-10-CM | POA: Insufficient documentation

## 2022-09-14 DIAGNOSIS — Z6839 Body mass index (BMI) 39.0-39.9, adult: Secondary | ICD-10-CM | POA: Insufficient documentation

## 2022-09-14 DIAGNOSIS — J449 Chronic obstructive pulmonary disease, unspecified: Secondary | ICD-10-CM | POA: Insufficient documentation

## 2022-09-14 DIAGNOSIS — Z7984 Long term (current) use of oral hypoglycemic drugs: Secondary | ICD-10-CM | POA: Insufficient documentation

## 2022-09-14 DIAGNOSIS — E1151 Type 2 diabetes mellitus with diabetic peripheral angiopathy without gangrene: Secondary | ICD-10-CM | POA: Diagnosis not present

## 2022-09-14 DIAGNOSIS — Z950 Presence of cardiac pacemaker: Secondary | ICD-10-CM | POA: Diagnosis not present

## 2022-09-14 DIAGNOSIS — I472 Ventricular tachycardia, unspecified: Secondary | ICD-10-CM | POA: Diagnosis not present

## 2022-09-14 DIAGNOSIS — R918 Other nonspecific abnormal finding of lung field: Secondary | ICD-10-CM | POA: Diagnosis not present

## 2022-09-14 HISTORY — PX: PACEMAKER IMPLANT: EP1218

## 2022-09-14 LAB — GLUCOSE, CAPILLARY
Glucose-Capillary: 148 mg/dL — ABNORMAL HIGH (ref 70–99)
Glucose-Capillary: 144 mg/dL — ABNORMAL HIGH (ref 70–99)

## 2022-09-14 SURGERY — PACEMAKER IMPLANT
Anesthesia: LOCAL

## 2022-09-14 MED ORDER — SODIUM CHLORIDE 0.9 % IV SOLN: INTRAVENOUS | Status: DC

## 2022-09-14 MED ORDER — LIDOCAINE HCL (PF) 1 % IJ SOLN
INTRAMUSCULAR | Status: AC
  Filled 2022-09-14: qty 60

## 2022-09-14 MED ORDER — POVIDONE-IODINE 10 % EX SWAB
2.0000 | Freq: Once | CUTANEOUS | Status: AC
  Administered 2022-09-14: 2 via TOPICAL

## 2022-09-14 MED ORDER — SODIUM CHLORIDE 0.9 % IV SOLN
INTRAVENOUS | Status: AC
  Filled 2022-09-14: qty 2

## 2022-09-14 MED ORDER — FENTANYL CITRATE (PF) 100 MCG/2ML IJ SOLN
INTRAMUSCULAR | Status: AC
  Filled 2022-09-14: qty 2

## 2022-09-14 MED ORDER — LIDOCAINE HCL (PF) 1 % IJ SOLN
INTRAMUSCULAR | Status: DC | PRN
  Administered 2022-09-14: 60 mL

## 2022-09-14 MED ORDER — BISOPROLOL FUMARATE 5 MG PO TABS: 5.0000 mg | ORAL_TABLET | Freq: Every day | ORAL | 3 refills | Status: DC

## 2022-09-14 MED ORDER — SODIUM CHLORIDE 0.9 % IV SOLN
80.0000 mg | INTRAVENOUS | Status: AC
  Administered 2022-09-14: 80 mg

## 2022-09-14 MED ORDER — ONDANSETRON HCL 4 MG/2ML IJ SOLN: 4.0000 mg | Freq: Four times a day (QID) | INTRAMUSCULAR | Status: DC | PRN

## 2022-09-14 MED ORDER — CEFAZOLIN SODIUM-DEXTROSE 2-4 GM/100ML-% IV SOLN
2.0000 g | Freq: Once | INTRAVENOUS | Status: AC
  Administered 2022-09-14: 2 g via INTRAVENOUS

## 2022-09-14 MED ORDER — CEFAZOLIN SODIUM-DEXTROSE 1-4 GM/50ML-% IV SOLN
1.0000 g | Freq: Once | INTRAVENOUS | Status: AC
  Administered 2022-09-14: 1 g via INTRAVENOUS
  Filled 2022-09-14: qty 50

## 2022-09-14 MED ORDER — ACETAMINOPHEN 325 MG PO TABS: 325.0000 mg | ORAL_TABLET | ORAL | Status: DC | PRN

## 2022-09-14 MED ORDER — MIDAZOLAM HCL 5 MG/5ML IJ SOLN
INTRAMUSCULAR | Status: AC
  Filled 2022-09-14: qty 5

## 2022-09-14 MED ORDER — CEFAZOLIN IN SODIUM CHLORIDE 3-0.9 GM/100ML-% IV SOLN: 3.0000 g | INTRAVENOUS | Status: DC

## 2022-09-14 MED ORDER — MIDAZOLAM HCL 5 MG/5ML IJ SOLN
INTRAMUSCULAR | Status: DC | PRN
  Administered 2022-09-14 (×2): 1 mg via INTRAVENOUS

## 2022-09-14 MED ORDER — FENTANYL CITRATE (PF) 100 MCG/2ML IJ SOLN
INTRAMUSCULAR | Status: DC | PRN
  Administered 2022-09-14 (×2): 25 ug via INTRAVENOUS

## 2022-09-14 MED ORDER — CEFAZOLIN SODIUM-DEXTROSE 2-4 GM/100ML-% IV SOLN
INTRAVENOUS | Status: AC
  Filled 2022-09-14: qty 100

## 2022-09-14 MED ORDER — CHLORHEXIDINE GLUCONATE 4 % EX LIQD: 4.0000 | Freq: Once | CUTANEOUS | Status: DC

## 2022-09-14 MED ORDER — HEPARIN (PORCINE) IN NACL 1000-0.9 UT/500ML-% IV SOLN
INTRAVENOUS | Status: DC | PRN
  Administered 2022-09-14: 500 mL

## 2022-09-14 MED ORDER — HEPARIN (PORCINE) IN NACL 1000-0.9 UT/500ML-% IV SOLN
INTRAVENOUS | Status: AC
  Filled 2022-09-14: qty 500

## 2022-09-14 MED ORDER — BISOPROLOL FUMARATE 5 MG PO TABS
5.0000 mg | ORAL_TABLET | Freq: Every day | ORAL | Status: DC
  Administered 2022-09-14: 5 mg via ORAL
  Filled 2022-09-14 (×2): qty 1

## 2022-09-14 SURGICAL SUPPLY — 12 items

## 2022-09-14 NOTE — Progress Notes (Signed)
Report given off to nurse Cardell Peach, RN

## 2022-09-14 NOTE — Progress Notes (Signed)
CXR report called to Dr. Caryl Comes who stated that patient was okay to be discharged after getting dose of bisoprolol that was ordered. Caryl Comes, MD stated that patient would be prescribed this medication at discharge. However, this was not ordered. MD stated that he would call Doreene Adas, PA to put this order in. Awaiting completion prior to patients discharge. Discharge instructions to be given to patient and patients daughter.

## 2022-09-14 NOTE — Discharge Instructions (Addendum)
     Supplemental Discharge Instructions for  Pacemaker/Defibrillator Patients  Tomorrow, 09/15/22, send in a device transmission  Activity No heavy lifting or vigorous activity with your left/right arm for 6 to 8 weeks.  Do not raise your left/right arm above your head for one week.  Gradually raise your affected arm as drawn below.              09/19/22               09/20/22              09/21/22              09/22/22 __  NO DRIVING for  1 week   ; you may begin driving on   [address]  .  WOUND CARE Keep the wound area clean and dry.  Do not get this area wet , no showers for a full 24 hours, you can shower 09/16/22     . Tomorrow, 09/15/22, remove the arm sling Dr. Caryl Comes used DERMABOND to close your wound.  DO NOT peel this off.  Do not rub/scrub the area, pat dry. No bandage is needed on the site.  DO  NOT apply any creams, oils, or ointments to the wound area. If you notice any drainage or discharge from the wound, any swelling or bruising at the site, or you develop a fever > 101? F after you are discharged home, call the office at once.  Special Instructions You are still able to use cellular telephones; use the ear opposite the side where you have your pacemaker/defibrillator.  Avoid carrying your cellular phone near your device. When traveling through airports, show security personnel your identification card to avoid being screened in the metal detectors.  Ask the security personnel to use the hand wand. Avoid arc welding equipment, MRI testing (magnetic resonance imaging), TENS units (transcutaneous nerve stimulators).  Call the office for questions about other devices. Avoid electrical appliances that are in poor condition or are not properly grounded. Microwave ovens are safe to be near or to operate.

## 2022-09-14 NOTE — H&P (Signed)
Patient Care Team: [name], MD as PCP - General (Family Medicine) [place], [place] as Consulting Physician   HPI  Steven Henry is a 71 y.o. male referred for prolonged pauses in the setting of  longterm persistent atrial fibrillation ECG 2021 >>NSR next tracing 6/23 AFIb wioth controlled rate  Known OSA with CPAP but drifts off to sleep during the day watching television   DOE at 100 ft, edema +, PND no, chest pain, palpitations, syncope no.  +cigs  1PPD  COPD   Last Apixaban  last night  DATE TEST EF   9/14 MYOVIEW  No perfusion defects   3/23 Echo  65-70%              Date Cr K Hgb  11/23 1.14 4.1 17.9<<18.9           Records and Results Reviewed   Past Medical History:  Diagnosis Date   A-fib ([place])    CHF (congestive heart failure) ([place])    CKD (chronic kidney disease), stage III ([place])    Colon polyps    Diabetes mellitus type II, uncontrolled    Diabetes mellitus without complication ([place])    Hypertension    Morbid obesity ([place])    PVD (peripheral vascular disease) ([place])    right femoral artery   Sleep apnea    not on CPAP    Sleep apnea     Past Surgical History:  Procedure Laterality Date   COLONOSCOPY N/A 09/16/2015   Procedure: COLONOSCOPY;  Surgeon: Daneil Dolin, MD;  Location: [facility];  Service: Endoscopy;  Laterality: N/A;  8:15 AM - pt has c pap   ENDOVENOUS ABLATION SAPHENOUS VEIN W/ LASER Right 08/26/2017   endovenous laser ablation right greater saphenous vein by Tinnie Gens MD    stab phlebectomy  Right 01/07/2018   stab phlebectomy 10-20 incisions right leg by Tinnie Gens MD     Current Facility-Administered Medications  Medication Dose Route Frequency Provider Last Rate Last Admin   0.9 %  sodium chloride infusion   Intravenous Continuous Deboraha Sprang, MD       ceFAZolin (ANCEF) IVPB 3g/100 mL premix  3 g Intravenous On Call Deboraha Sprang, MD       chlorhexidine  (HIBICLENS) 4 % liquid 4 Application  4 Application Topical Once Deboraha Sprang, MD       gentamicin (GARAMYCIN) 80 mg in sodium chloride 0.9 % 500 mL irrigation  80 mg Irrigation On Call Deboraha Sprang, MD        No Known Allergies    Social History   Tobacco Use   Smoking status: Every Day    Packs/day: 1.50    Years: 30.00    Total pack years: 45.00    Types: Cigarettes    Start date: 04/04/1958   Smokeless tobacco: Never  Vaping Use   Vaping Use: Never used  Substance Use Topics   Alcohol use: Yes    Comment: occasional   Drug use: No     Family History  Problem Relation Age of Onset   Colon cancer Mother    Diabetes Mother    Heart disease Mother    Diabetes Sister    Diabetes Sister    Diabetes Sister      Current Meds  Medication Sig   apixaban (ELIQUIS) 5 MG TABS tablet Take 1 tablet (5 mg total) by mouth 2 (two) times  daily.   atorvastatin (LIPITOR) 40 MG tablet TAKE 1 TABLET BY MOUTH DAILY   Cholecalciferol (VITAMIN D3) 10 MCG (400 UNIT) tablet Take 800 Units by mouth daily.   empagliflozin (JARDIANCE) 25 MG TABS tablet Take 1 tablet (25 mg total) by mouth daily before breakfast.   furosemide (LASIX) 40 MG tablet TAKE 1 TABLET BY MOUTH TWICE DAILY   glipiZIDE (GLUCOTROL) 10 MG tablet TAKE 1 TABLET BY MOUTH EVERY DAY BEFORE breakfast   insulin glargine (LANTUS) 100 UNIT/ML injection Inject 0.25 mLs (25 Units total) into the skin daily. PLEASE INCREASE INSULIN BY 1 UNIT DAILY UNTIL FBG IS <130, THEN HOLD AT THAT DOSE   ipratropium-albuterol (DUONEB) 0.5-2.5 (3) MG/3ML SOLN Take 3 mLs by nebulization 3 (three) times daily.   metFORMIN (GLUCOPHAGE) 1000 MG tablet TAKE 1 TABLET BY MOUTH TWICE DAILY WITH a meal   Multiple Vitamin (MULTIVITAMIN) capsule Take 1 capsule by mouth daily. Mens one a day   Multiple Vitamins-Minerals (ZINC PO) Take 1 tablet by mouth daily.   potassium chloride SA (KLOR-CON M20) 20 MEQ tablet Take 1 tablet (20 mEq total) by mouth daily.    Social History   Socioeconomic History   Marital status: Divorced    Spouse name: Not on file   Number of children: 2   Years of education: Not on file   Highest education level: Not on file  Occupational History   Occupation: Self Employed  Tobacco Use   Smoking status: Every Day    Packs/day: 1.50    Years: 30.00    Total pack years: 45.00    Types: Cigarettes    Start date: 04/04/1958   Smokeless tobacco: Never  Vaping Use   Vaping Use: Never used  Substance and Sexual Activity   Alcohol use: Yes    Comment: occasional   Drug use: No   Sexual activity: Not on file    Comment: divorced, 2 daughters.  works Psychologist, occupational for fairs, Social research officer, government.  Other Topics Concern   Not on file  Social History Narrative   Not on file   Social Determinants of Health   Financial Resource Strain: Low Risk  (09/06/2022)   Overall Financial Resource Strain (CARDIA)    Difficulty of Paying Living Expenses: Not hard at all  Food Insecurity: No Food Insecurity (09/06/2022)   Hunger Vital Sign    Worried About Running Out of Food in the Last Year: Never true    Ran Out of Food in the Last Year: Never true  Transportation Needs: No Transportation Needs (09/06/2022)   PRAPARE - Hydrologist (Medical): No    Lack of Transportation (Non-Medical): No  Physical Activity: Sufficiently Active (09/06/2022)   Exercise Vital Sign    Days of Exercise per Week: 5 days    Minutes of Exercise per Session: 30 min  Stress: No Stress Concern Present (09/06/2022)   [place]    Feeling of Stress : Not at all  Social Connections: Socially Isolated (09/06/2022)   Social Connection and Isolation Panel [NHANES]    Frequency of Communication with Friends and Family: More than three times a week    Frequency of Social Gatherings with Friends and Family: Three times a week    Attends Religious Services: Never     Active Member of Clubs or Organizations: No    Attends Archivist Meetings: Never    Marital Status: Divorced  Human resources officer Violence: Not  At Risk (09/06/2022)   Humiliation, Afraid, Rape, and Kick questionnaire    Fear of Current or Ex-Partner: No    Emotionally Abused: No    Physically Abused: No    Sexually Abused: No   Family History  Problem Relation Age of Onset   Colon cancer Mother    Diabetes Mother    Heart disease Mother    Diabetes Sister    Diabetes Sister    Diabetes Sister       Review of Systems negative except from HPI and PMH  Physical Exam BP 108/77   Pulse 66   Temp 97.8 F (36.6 C) (Temporal)   Resp 17   Ht '6\' 1"'$  (1.854 m)   Wt 134.7 kg   SpO2 98%   BMI 39.18 kg/m  Well developed and morbidly obese in no acute distress HENT normal E scleral and icterus clear Neck Supple JVP 6-7; carotids brisk and full Clear to ausculation Irregular rate and rhythm, no murmurs gallops or rub Soft with active bowel sounds No clubbing cyanosis 2+ Edema Alert and oriented, grossly normal motor and sensory function Skin Warm and Dry    Assessment and  Plan  Atrial Fibrillation  persistent  OSA Rx  Ventricular tachycardia  Left Ventricular function normal   Erythrocytosis    Claudication   Pulm HTN with RV enlargement   HFpEF   Pt with persistent atrial fib with pauses, mostly nocturnal and with day time somnolence notwithstanding CPAP almost certainly 2/2 OSA.  Normally not an indication for pacing, but also with VT in setting of normal LV function and rate controlled afib so will need BB which would aggravate bradycardia  The benefits and risks were reviewed including but not limited to death,  perforation, infection, lead dislodgement and device malfunction.  The patient understands agrees and is willing to proceed.  Needs epo to assess erythrocytosis likely 2/2 COPD  Following pacer implant would anticipate DCCV in about 4  weeks   ? Nocturnal O2 scan to explain pulm HTN which is likely mix of class 2 and 3 HFpEF likely contributing with type 2>> diuresis at discharge  Begin BB post pacing bisoprolol for selectivity

## 2022-09-18 ENCOUNTER — Encounter (HOSPITAL_COMMUNITY): Payer: Self-pay | Admitting: Internal Medicine

## 2022-09-26 ENCOUNTER — Encounter: Payer: Self-pay | Admitting: Family Medicine

## 2022-09-27 ENCOUNTER — Ambulatory Visit: Payer: Medicare Other | Attending: Internal Medicine

## 2022-09-27 ENCOUNTER — Ambulatory Visit: Payer: Medicare Other

## 2022-09-27 DIAGNOSIS — I4891 Unspecified atrial fibrillation: Secondary | ICD-10-CM | POA: Diagnosis not present

## 2022-09-27 DIAGNOSIS — D751 Secondary polycythemia: Secondary | ICD-10-CM

## 2022-09-27 LAB — CUP PACEART INCLINIC DEVICE CHECK
Date Time Interrogation Session: 20240104165947
Implantable Lead Connection Status: 753985
Implantable Lead Connection Status: 753985
Implantable Lead Implant Date: 20231222
Implantable Lead Implant Date: 20231222
Implantable Lead Location: 753859
Implantable Lead Location: 753860
Implantable Lead Model: 3830
Implantable Lead Model: 5076
Implantable Pulse Generator Implant Date: 20231222

## 2022-09-27 NOTE — Patient Instructions (Signed)
   After Your Pacemaker   Monitor your pacemaker site for redness, swelling, and drainage. Call the device clinic at [phone] if you experience these symptoms or fever/chills.  Your incision was closed with Dermabond:  You may shower 1 day after your defibrillator implant and wash your incision with soap and water. Avoid lotions, ointments, or perfumes over your incision until it is well-healed.  You may use a hot tub or a pool after your wound check appointment if the incision is completely closed.  Do not lift, push or pull greater than 10 pounds with the affected arm until 6 weeks after your procedure. There are no other restrictions in arm movement after your wound check appointment.  You may drive, unless driving has been restricted by your healthcare providers.  Remote monitoring is used to monitor your pacemaker from home. This monitoring is scheduled every 91 days by our office. It allows us to keep an eye on the functioning of your device to ensure it is working properly. You will routinely see your Electrophysiologist annually (more often if necessary).  

## 2022-09-27 NOTE — Progress Notes (Signed)
Wound check appointment. Dermabond removed. Wound without redness or edema. Incision edges approximated, wound well healed. Normal device function. Thresholds, sensing, and impedances consistent with implant measurements. Device programmed at 3.5V/auto capture programmed on for extra safety margin until 3 month visit. Histogram distribution appropriate for patient and level of activity. 100% Afib on [place]. Patient educated about wound care, arm mobility, lifting restrictions. ROV in 3 months with implanting physician.

## 2022-09-28 ENCOUNTER — Encounter: Payer: Self-pay | Admitting: Family Medicine

## 2022-09-28 ENCOUNTER — Ambulatory Visit (INDEPENDENT_AMBULATORY_CARE_PROVIDER_SITE_OTHER): Payer: Medicare Other | Admitting: Family Medicine

## 2022-09-28 VITALS — BP 128/76 | HR 92 | Ht 73.0 in | Wt 294.0 lb

## 2022-09-28 DIAGNOSIS — L03115 Cellulitis of right lower limb: Secondary | ICD-10-CM

## 2022-09-28 LAB — ERYTHROPOIETIN: Erythropoietin: 27 m[IU]/mL — ABNORMAL HIGH (ref 2.6–18.5)

## 2022-09-28 MED ORDER — CEPHALEXIN 500 MG PO CAPS: 500.0000 mg | ORAL_CAPSULE | Freq: Three times a day (TID) | ORAL | 0 refills | Status: DC

## 2022-09-28 NOTE — Progress Notes (Signed)
Subjective:  Patient presents today with a wound on his right lower leg.  He suffered an injury on January 1.  There is some surrounding erythema.  The superficial ulcer is 3 cm x 2 cm.  There is skin adjacent to that it is erythematous and warm.  He has diminished sensation in his feet so he does not appreciate any pain.  This is due to his diabetic neuropathy  Past Medical History:  Diagnosis Date   A-fib ([place])    CHF (congestive heart failure) (HCC)    CKD (chronic kidney disease), stage III (HCC)    Colon polyps    Diabetes mellitus type II, uncontrolled    Diabetes mellitus without complication ([place])    Hypertension    Morbid obesity ([place])    PVD (peripheral vascular disease) ([place])    right femoral artery   Sleep apnea    not on CPAP    Sleep apnea    Past Surgical History:  Procedure Laterality Date   COLONOSCOPY N/A 09/16/2015   Procedure: COLONOSCOPY;  Surgeon: Daneil Dolin, MD;  [facility];  Service: Endoscopy;  Laterality: N/A;  8:15 AM - pt has c pap   ENDOVENOUS ABLATION SAPHENOUS VEIN W/ LASER Right 08/26/2017   endovenous laser ablation right greater saphenous vein by Tinnie Gens MD    PACEMAKER IMPLANT N/A 09/14/2022   Procedure: PACEMAKER IMPLANT;  Surgeon: Deboraha Sprang, MD;  Location: [facility];  Service: Cardiovascular;  Laterality: N/A;   stab phlebectomy  Right 01/07/2018   stab phlebectomy 10-20 incisions right leg by Tinnie Gens MD    Current Outpatient Medications on File Prior to Visit  Medication Sig Dispense Refill   Accu-Chek FastClix Lancets MISC Check BS QAM - DX: E11.9 100 each 3   apixaban (ELIQUIS) 5 MG TABS tablet Take 1 tablet (5 mg total) by mouth 2 (two) times daily. 60 tablet 3   atorvastatin (LIPITOR) 40 MG tablet TAKE 1 TABLET BY MOUTH DAILY 90 tablet 3   bisoprolol (ZEBETA) 5 MG tablet Take 1 tablet (5 mg total) by mouth daily. 90 tablet 3   Blood Glucose Monitoring Suppl (ONE TOUCH ULTRA 2) w/Device KIT  USETO CHECK BLOOD SUGAR EVERY MORNING 1 kit 0   Cholecalciferol (VITAMIN D3) 10 MCG (400 UNIT) tablet Take 800 Units by mouth daily.     Continuous Blood Gluc Receiver (FREESTYLE LIBRE 2 READER) DEVI For continuous blood glucose monitoring 1 each 1   Continuous Blood Gluc Sensor (FREESTYLE LIBRE 3 SENSOR) MISC Place 1 sensor on the skin every 14 days. Use to check glucose continuously 2 each 3   empagliflozin (JARDIANCE) 25 MG TABS tablet Take 1 tablet (25 mg total) by mouth daily before breakfast. 30 tablet 3   furosemide (LASIX) 40 MG tablet TAKE 1 TABLET BY MOUTH TWICE DAILY 180 tablet 1   glipiZIDE (GLUCOTROL) 10 MG tablet TAKE 1 TABLET BY MOUTH EVERY DAY BEFORE breakfast 90 tablet 3   insulin glargine (LANTUS) 100 UNIT/ML injection Inject 0.25 mLs (25 Units total) into the skin daily. PLEASE INCREASE INSULIN BY 1 UNIT DAILY UNTIL FBG IS <130, THEN HOLD AT THAT DOSE 30 mL 5   ipratropium-albuterol (DUONEB) 0.5-2.5 (3) MG/3ML SOLN Take 3 mLs by nebulization 3 (three) times daily. 360 mL 1   Lancets Misc. (ACCU-CHEK FASTCLIX LANCET) KIT Check BS QAM - DX: E11.9 1 kit 0   metFORMIN (GLUCOPHAGE) 1000 MG tablet TAKE 1 TABLET BY MOUTH TWICE DAILY  WITH a meal 180 tablet 3   Multiple Vitamin (MULTIVITAMIN) capsule Take 1 capsule by mouth daily. Mens one a day     Multiple Vitamins-Minerals (ZINC PO) Take 1 tablet by mouth daily.     ONETOUCH ULTRA test strip USE TO CHECK BLOOD SUGAR EVERY MORNING 100 strip 12   potassium chloride SA (KLOR-CON M20) 20 MEQ tablet Take 1 tablet (20 mEq total) by mouth daily. 90 tablet 3   Semaglutide,0.25 or 0.'5MG'$ /DOS, (OZEMPIC, 0.25 OR 0.5 MG/DOSE,) 2 MG/3ML SOPN Inject 0.5 mg into the skin once a week. 3 mL 1   No current facility-administered medications on file prior to visit.   No Known Allergies Social History   Socioeconomic History   Marital status: Divorced    Spouse name: Not on file   Number of children: 2   Years of education: Not on file   Highest  education level: Not on file  Occupational History   Occupation: Self Employed  Tobacco Use   Smoking status: Every Day    Packs/day: 1.50    Years: 30.00    Total pack years: 45.00    Types: Cigarettes    Start date: 04/04/1958   Smokeless tobacco: Never  Vaping Use   Vaping Use: Never used  Substance and Sexual Activity   Alcohol use: Yes    Comment: occasional   Drug use: No   Sexual activity: Not on file    Comment: divorced, 2 daughters.  works Psychologist, occupational for fairs, Social research officer, government.  Other Topics Concern   Not on file  Social History Narrative   Not on file   Social Determinants of Health   Financial Resource Strain: Low Risk  (09/06/2022)   Overall Financial Resource Strain (CARDIA)    Difficulty of Paying Living Expenses: Not hard at all  Food Insecurity: No Food Insecurity (09/06/2022)   Hunger Vital Sign    Worried About Running Out of Food in the Last Year: Never true    Ran Out of Food in the Last Year: Never true  Transportation Needs: No Transportation Needs (09/06/2022)   PRAPARE - Hydrologist (Medical): No    Lack of Transportation (Non-Medical): No  Physical Activity: Sufficiently Active (09/06/2022)   Exercise Vital Sign    Days of Exercise per Week: 5 days    Minutes of Exercise per Session: 30 min  Stress: No Stress Concern Present (09/06/2022)   [place]    Feeling of Stress : Not at all  Social Connections: Socially Isolated (09/06/2022)   Social Connection and Isolation Panel [NHANES]    Frequency of Communication with Friends and Family: More than three times a week    Frequency of Social Gatherings with Friends and Family: Three times a week    Attends Religious Services: Never    Active Member of Clubs or Organizations: No    Attends Archivist Meetings: Never    Marital Status: Divorced  Human resources officer Violence: Not At Risk (09/06/2022)    Humiliation, Afraid, Rape, and Kick questionnaire    Fear of Current or Ex-Partner: No    Emotionally Abused: No    Physically Abused: No    Sexually Abused: No     Review of Systems  All other systems reviewed and are negative.      Objective:   Physical Exam Vitals reviewed.  Constitutional:      General: He is not in acute distress.  Appearance: Normal appearance. He is obese. He is not ill-appearing or toxic-appearing.  HENT:     Head: Normocephalic and atraumatic.  Neck:     Vascular: No carotid bruit.  Cardiovascular:     Rate and Rhythm: Normal rate and regular rhythm.     Pulses: Normal pulses.     Heart sounds: Normal heart sounds. No murmur heard.    No friction rub. No gallop.  Pulmonary:     Effort: Pulmonary effort is normal. No respiratory distress.     Breath sounds: No stridor. Wheezing and rhonchi present. No rales.  Chest:     Chest wall: No tenderness.  Musculoskeletal:     Cervical back: Normal range of motion and neck supple.     Right lower leg: No edema.     Left lower leg: No edema.       Feet:  Feet:     Right foot:     Skin integrity: Ulcer, erythema and warmth present.  Lymphadenopathy:     Cervical: No cervical adenopathy.  Skin:    Coloration: Skin is not jaundiced.     Findings: Lesion present. No bruising, erythema or rash.  Neurological:     General: No focal deficit present.     Mental Status: He is alert and oriented to person, place, and time.     Cranial Nerves: No cranial nerve deficit.     Sensory: No sensory deficit.     Motor: No weakness.     Coordination: Coordination normal.     Gait: Gait normal.     Deep Tendon Reflexes: Reflexes normal.       Assessment & Pl  Cellulitis of right lower extremity Due to the patient's diabetes and venous stasis dermatitis I will treat the patient for possible early cellulitis with Keflex 500 mg p.o. 3 times daily for 7 days.  Recommended that he keep the wound covered with  antibiotic ointment and dressing.  Recheck in 1 week if no better or sooner if worsening.  On a positive note, he is using his continuous blood glucose monitor.  He reports time in range 87% with only 2% hypoglycemic.  This is much improved.  I anticipate his next A1c will be excellent.  Recommended smoking cessation

## 2022-09-29 ENCOUNTER — Other Ambulatory Visit: Payer: Self-pay | Admitting: Family Medicine

## 2022-09-29 DIAGNOSIS — E1100 Type 2 diabetes mellitus with hyperosmolarity without nonketotic hyperglycemic-hyperosmolar coma (NKHHC): Secondary | ICD-10-CM

## 2022-09-29 DIAGNOSIS — N183 Chronic kidney disease, stage 3 unspecified: Secondary | ICD-10-CM

## 2022-10-01 ENCOUNTER — Other Ambulatory Visit: Payer: Self-pay | Admitting: Family Medicine

## 2022-10-01 DIAGNOSIS — I480 Paroxysmal atrial fibrillation: Secondary | ICD-10-CM

## 2022-10-02 DIAGNOSIS — J9601 Acute respiratory failure with hypoxia: Secondary | ICD-10-CM | POA: Diagnosis not present

## 2022-10-02 DIAGNOSIS — J449 Chronic obstructive pulmonary disease, unspecified: Secondary | ICD-10-CM | POA: Diagnosis not present

## 2022-10-12 ENCOUNTER — Encounter (HOSPITAL_COMMUNITY): Payer: Self-pay | Admitting: Physician Assistant

## 2022-10-12 ENCOUNTER — Ambulatory Visit (HOSPITAL_COMMUNITY)
Admit: 2022-10-12 | Discharge: 2022-10-12 | Disposition: A | Discharge status: Home / Self Care | Payer: Medicare Other | Attending: Physician Assistant | Admitting: Physician Assistant

## 2022-10-12 VITALS — BP 132/90 | HR 74 | Ht 73.0 in | Wt 294.0 lb

## 2022-10-12 DIAGNOSIS — I110 Hypertensive heart disease with heart failure: Secondary | ICD-10-CM | POA: Diagnosis not present

## 2022-10-12 DIAGNOSIS — D6869 Other thrombophilia: Secondary | ICD-10-CM | POA: Diagnosis not present

## 2022-10-12 DIAGNOSIS — G4733 Obstructive sleep apnea (adult) (pediatric): Secondary | ICD-10-CM | POA: Insufficient documentation

## 2022-10-12 DIAGNOSIS — J449 Chronic obstructive pulmonary disease, unspecified: Secondary | ICD-10-CM | POA: Diagnosis not present

## 2022-10-12 DIAGNOSIS — E669 Obesity, unspecified: Secondary | ICD-10-CM | POA: Diagnosis not present

## 2022-10-12 DIAGNOSIS — Z6838 Body mass index (BMI) 38.0-38.9, adult: Secondary | ICD-10-CM | POA: Diagnosis not present

## 2022-10-12 DIAGNOSIS — I5032 Chronic diastolic (congestive) heart failure: Secondary | ICD-10-CM | POA: Insufficient documentation

## 2022-10-12 DIAGNOSIS — I4819 Other persistent atrial fibrillation: Secondary | ICD-10-CM | POA: Diagnosis not present

## 2022-10-12 DIAGNOSIS — I495 Sick sinus syndrome: Secondary | ICD-10-CM | POA: Insufficient documentation

## 2022-10-12 DIAGNOSIS — E119 Type 2 diabetes mellitus without complications: Secondary | ICD-10-CM | POA: Insufficient documentation

## 2022-10-12 DIAGNOSIS — I480 Paroxysmal atrial fibrillation: Secondary | ICD-10-CM

## 2022-10-12 NOTE — H&P (View-Only) (Signed)
Primary Care Physician: [name], MD Primary Cardiologist: Dr Dellia Cloud Primary Electrophysiologist: Dr Caryl Comes Referring Physician: Dr Eden Lathe Steven Henry is a 72 y.o. male with a history of HTN, COPD, OSA, DM, tobacco abuse, HFpEF, aortic dilatation, atrial fibrillation who presents for consultation in the [facility].  Pt was in [place] in 2023, and presented to local hospital with CHF and SOB, found to be in A-fib. Was diuresed and left AMA. In June 2023 presented to [facility] with increased abdominal swelling and orthopnea. Treated with IV Lasix, echo revealed EF 65 to 70%. Was sent home on p.o. Lasix and Eliquis. Patient is on Eliquis for a CHADS2VASC score of 5. He was noted to have a slow ventricular rate and a Zio monitor was placed which showed several pauses, up to 9.8 seconds. He underwent PPM insertion with Dr Caryl Comes on 09/14/22.   On follow up today, patient remains in persistent afib with chronic SOB on exertion. No bleeding issues on anticoagulation.   Today, he denies symptoms of palpitations, chest pain, orthopnea, PND, lower extremity edema, dizziness, presyncope, syncope, snoring, daytime somnolence, bleeding, or neurologic sequela. The patient is tolerating medications without difficulties and is otherwise without complaint today.    Atrial Fibrillation Risk Factors:  he does have symptoms or diagnosis of sleep apnea. he is compliant with CPAP therapy. he does not have a history of rheumatic fever.   he has a BMI of Body mass index is 38.79 kg/m.Marland Kitchen Filed Weights   10/12/22 0902  Weight: 133.4 kg    Family History  Problem Relation Age of Onset   Colon cancer Mother    Diabetes Mother    Heart disease Mother    Diabetes Sister    Diabetes Sister    Diabetes Sister      Atrial Fibrillation Management history:  Previous antiarrhythmic drugs: none Previous cardioversions: none Previous ablations:  none CHADS2VASC score: 5 Anticoagulation history: Eliquis   Past Medical History:  Diagnosis Date   A-fib ([place])    CHF (congestive heart failure) ([place])    CKD (chronic kidney disease), stage III ([place])    Colon polyps    Diabetes mellitus type II, uncontrolled    Diabetes mellitus without complication ([place])    Hypertension    Morbid obesity ([place])    PVD (peripheral vascular disease) ([place])    right femoral artery   Sleep apnea    not on CPAP    Sleep apnea    Past Surgical History:  Procedure Laterality Date   COLONOSCOPY N/A 09/16/2015   Procedure: COLONOSCOPY;  Surgeon: Daneil Dolin, MD;  [facility];  Service: Endoscopy;  Laterality: N/A;  8:15 AM - pt has c pap   ENDOVENOUS ABLATION SAPHENOUS VEIN W/ LASER Right 08/26/2017   endovenous laser ablation right greater saphenous vein by Tinnie Gens MD    PACEMAKER IMPLANT N/A 09/14/2022   Procedure: PACEMAKER IMPLANT;  Surgeon: Deboraha Sprang, MD;  Location: [facility];  Service: Cardiovascular;  Laterality: N/A;   stab phlebectomy  Right 01/07/2018   stab phlebectomy 10-20 incisions right leg by Tinnie Gens MD     Current Outpatient Medications  Medication Sig Dispense Refill   Accu-Chek FastClix Lancets MISC Check BS QAM - DX: E11.9 100 each 3   atorvastatin (LIPITOR) 40 MG tablet TAKE 1 TABLET BY MOUTH DAILY 90 tablet 3   bisoprolol (ZEBETA) 5 MG tablet Take 1 tablet (  5 mg total) by mouth daily. 90 tablet 3   Blood Glucose Monitoring Suppl (ONE TOUCH ULTRA 2) w/Device KIT USETO CHECK BLOOD SUGAR EVERY MORNING 1 kit 0   cephALEXin (KEFLEX) 500 MG capsule Take 1 capsule (500 mg total) by mouth 3 (three) times daily. 21 capsule 0   Cholecalciferol (VITAMIN D3) 10 MCG (400 UNIT) tablet Take 800 Units by mouth daily.     Continuous Blood Gluc Receiver (FREESTYLE LIBRE 2 READER) DEVI For continuous blood glucose monitoring 1 each 1   Continuous Blood Gluc Sensor (FREESTYLE LIBRE 2 SENSOR) MISC APPLY 1  SENSOR ON THE SKIN EVERY 14 DAYS, USE AS DIRECTED TO CHECK BLOOD SUGAR 2 each 3   ELIQUIS 5 MG TABS tablet TAKE 1 TABLET BY MOUTH TWICE DAILY 60 tablet 3   empagliflozin (JARDIANCE) 25 MG TABS tablet Take 1 tablet (25 mg total) by mouth daily before breakfast. 30 tablet 3   furosemide (LASIX) 40 MG tablet TAKE 1 TABLET BY MOUTH TWICE DAILY 180 tablet 1   glipiZIDE (GLUCOTROL) 10 MG tablet TAKE 1 TABLET BY MOUTH EVERY DAY BEFORE breakfast 90 tablet 3   insulin glargine (LANTUS) 100 UNIT/ML injection Inject 0.25 mLs (25 Units total) into the skin daily. PLEASE INCREASE INSULIN BY 1 UNIT DAILY UNTIL FBG IS <130, THEN HOLD AT THAT DOSE 30 mL 5   ipratropium-albuterol (DUONEB) 0.5-2.5 (3) MG/3ML SOLN Take 3 mLs by nebulization 3 (three) times daily. 360 mL 1   Lancets Misc. (ACCU-CHEK FASTCLIX LANCET) KIT Check BS QAM - DX: E11.9 1 kit 0   metFORMIN (GLUCOPHAGE) 1000 MG tablet TAKE 1 TABLET BY MOUTH TWICE DAILY WITH a meal 180 tablet 3   Multiple Vitamin (MULTIVITAMIN) capsule Take 1 capsule by mouth daily. Mens one a day     Multiple Vitamins-Minerals (ZINC PO) Take 1 tablet by mouth daily.     ONETOUCH ULTRA test strip USE TO CHECK BLOOD SUGAR EVERY MORNING 100 strip 12   potassium chloride SA (KLOR-CON M20) 20 MEQ tablet Take 1 tablet (20 mEq total) by mouth daily. 90 tablet 3   Semaglutide,0.25 or 0.5MG/DOS, (OZEMPIC, 0.25 OR 0.5 MG/DOSE,) 2 MG/3ML SOPN Inject 0.5 mg into the skin once a week. 3 mL 1   No current facility-administered medications for this encounter.    No Known Allergies  Social History   Socioeconomic History   Marital status: Divorced    Spouse name: Not on file   Number of children: 2   Years of education: Not on file   Highest education level: Not on file  Occupational History   Occupation: Self Employed  Tobacco Use   Smoking status: Every Day    Packs/day: 1.50    Years: 30.00    Total pack years: 45.00    Types: Cigarettes    Start date: 04/04/1958    Smokeless tobacco: Never   Tobacco comments:    Currently smoke 1 ppd 10/12/22  Vaping Use   Vaping Use: Never used  Substance and Sexual Activity   Alcohol use: Yes    Alcohol/week: 1.0 standard drink of alcohol    Types: 1 Cans of beer per week    Comment: 1 beer once a month 10/12/22   Drug use: No   Sexual activity: Not on file    Comment: divorced, 2 daughters.  works Psychologist, occupational for fairs, Social research officer, government.  Other Topics Concern   Not on file  Social History Narrative   Not on file   Social Determinants  of Health   Financial Resource Strain: Low Risk  (09/06/2022)   Overall Financial Resource Strain (CARDIA)    Difficulty of Paying Living Expenses: Not hard at all  Food Insecurity: No Food Insecurity (09/06/2022)   Hunger Vital Sign    Worried About Running Out of Food in the Last Year: Never true    Ran Out of Food in the Last Year: Never true  Transportation Needs: No Transportation Needs (09/06/2022)   PRAPARE - Hydrologist (Medical): No    Lack of Transportation (Non-Medical): No  Physical Activity: Sufficiently Active (09/06/2022)   Exercise Vital Sign    Days of Exercise per Week: 5 days    Minutes of Exercise per Session: 30 min  Stress: No Stress Concern Present (09/06/2022)   [place]    Feeling of Stress : Not at all  Social Connections: Socially Isolated (09/06/2022)   Social Connection and Isolation Panel [NHANES]    Frequency of Communication with Friends and Family: More than three times a week    Frequency of Social Gatherings with Friends and Family: Three times a week    Attends Religious Services: Never    Active Member of Clubs or Organizations: No    Attends Archivist Meetings: Never    Marital Status: Divorced  Human resources officer Violence: Not At Risk (09/06/2022)   Humiliation, Afraid, Rape, and Kick questionnaire    Fear of Current or  Ex-Partner: No    Emotionally Abused: No    Physically Abused: No    Sexually Abused: No     ROS- All systems are reviewed and negative except as per the HPI above.  Physical Exam: Vitals:   10/12/22 0902  BP: (!) 132/90  Pulse: 74  Weight: 133.4 kg  Height: 6' 1"$  (1.854 m)    GEN- The patient is a well appearing obese male, alert and oriented x 3 today.   Head- normocephalic, atraumatic Eyes-  Sclera clear, conjunctiva pink Ears- hearing intact Oropharynx- clear Neck- supple  Lungs- Clear to ausculation bilaterally, normal work of breathing Heart- irregular rate and rhythm, no rubs or gallops, 2/6 systolic murmur GI- soft, NT, ND, + BS Extremities- no clubbing, cyanosis, or edema MS- no significant deformity or atrophy Skin- no rash or lesion Psych- euthymic mood, full affect Neuro- strength and sensation are intact  Wt Readings from Last 3 Encounters:  10/12/22 133.4 kg  09/28/22 133.4 kg  09/14/22 134.7 kg    EKG today demonstrates  Afib, RBBB Vent. rate 74 BPM PR interval * ms QRS duration 144 ms QT/QTcB 406/450 ms  Echo 02/26/22 demonstrated   1. Left ventricular ejection fraction, by estimation, is 65 to 70%. The  left ventricle has normal function. The left ventricle has no regional  wall motion abnormalities. There is mild left ventricular hypertrophy.  Left ventricular diastolic parameters are indeterminate.   2. Right ventricular systolic function is normal. The right ventricular  size is moderately enlarged. There is moderately elevated pulmonary artery  systolic pressure. The estimated right ventricular systolic pressure is  [id number] mmHg.   3. Left atrial size was mildly dilated.   4. Right atrial size was moderately dilated.   5. The mitral valve is normal in structure. No evidence of mitral valve  regurgitation. No evidence of mitral stenosis.   6. The aortic valve is calcified. There is moderate calcification of the  aortic valve. There is mild  thickening  of the aortic valve. Aortic valve  regurgitation is not visualized. Aortic valve sclerosis is present, with  no evidence of aortic valve  stenosis.   7. Aortic dilatation noted. There is mild dilatation of the aortic root,  measuring 41 mm. There is mild dilatation of the ascending aorta,  measuring 41 mm.   8. The inferior vena cava is dilated in size with <50% respiratory  variability, suggesting right atrial pressure of 15 mmHg.   Epic records are reviewed at length today  CHA2DS2-VASc Score = 5  The patient's score is based upon: CHF History: 1 HTN History: 1 Diabetes History: 1 Stroke History: 0 Vascular Disease History: 1 Age Score: 1 Gender Score: 0       ASSESSMENT AND PLAN: 1. Persistent Atrial Fibrillation (ICD10:  I48.19) The patient's CHA2DS2-VASc score is 5, indicating a 7.2% annual risk of stroke.   Patient remains in persistent afib. We discussed rhythm control options today. Will plan for DCCV.  Continue Eliquis 5 mg BID, patient denies any missed doses.  Continue bisoprolol 5 mg daily  2. Secondary Hypercoagulable State (ICD10:  D68.69) The patient is at significant risk for stroke/thromboembolism based upon his CHA2DS2-VASc Score of 5.  Continue Apixaban (Eliquis).   3. Obesity Body mass index is 38.79 kg/m. Lifestyle modification was discussed at length including regular exercise and weight reduction.  4. Obstructive sleep apnea The importance of adequate treatment of sleep apnea was discussed today in order to improve our ability to maintain sinus rhythm long term. Encouraged compliance with CPAP therapy.   5. Sinus node dysfunction S/p PPM, followed by Dr Caryl Comes and the device clinic.  6. HTN Stable, no changes today.  7. Chronic HFpEF Fluid status appears stable.    Follow up in the AF clinic 1-2 weeks post DCCV.    [facility] [address] [place], [place]  [id number] [phone] 10/12/2022 9:35 AM

## 2022-10-12 NOTE — Patient Instructions (Addendum)
Have labs done 1/29-2/1  Cardioversion scheduled for: Tuesday, February 6th   - Arrive at the [facility] and go to admitting at 630am   - Do not eat or drink anything after midnight the night prior to your procedure.   - Take all your morning medication (except diabetic medications) with a sip of water prior to arrival.  - You will not be able to drive home after your procedure.    - Do NOT miss any doses of your blood thinner - if you should miss a dose please notify our office immediately.   - If you feel as if you go back into normal rhythm prior to scheduled cardioversion, please notify our office immediately.  If your procedure is canceled in the cardioversion suite you will be charged a cancellation fee.   HOLD OZEMPIC ON 2/1  If you are on weekly OZEMPIC, TRULICITY, MOUNJARO, WEGOVY, OR BYDUREON  Hold medication 7 days prior to scheduled procedure/anesthesia.  Restart medication on the normal dosing day after scheduled procedure/anesthesia  If you are on daily BYETTA, VICTOZA, ADLYXIN, OR RYBELSUS:   Hold medication 24 hours prior to scheduled procedure/anesthesia.   Restart medication on the following day after scheduled procedure/anesthesia   For those patients who have a scheduled procedure/anesthesia on the same day of the week as their dose, hold the medication on the day of surgery.  They can take their scheduled dose the week before.  **Patients on the above medications scheduled for elective procedures that have not held the medication for the appropriate amount of time are at risk of cancellation or change in the anesthetic plan.

## 2022-10-12 NOTE — Progress Notes (Signed)
  Primary Care Physician: Pickard, Warren T, MD Primary Cardiologist: Dr Mallipeddi Primary Electrophysiologist: Dr Klein Referring Physician: Dr Klein   Steven Henry is a 72 y.o. male with a history of HTN, COPD, OSA, DM, tobacco abuse, HFpEF, aortic dilatation, atrial fibrillation who presents for consultation in the  Atrial Fibrillation Clinic.  Pt was in [place] in 2023, and presented to local hospital with CHF and SOB, found to be in A-fib. Was diuresed and left AMA. In June 2023 presented to [facility] with increased abdominal swelling and orthopnea. Treated with IV Lasix, echo revealed EF 65 to 70%. Was sent home on p.o. Lasix and Eliquis. Patient is on Eliquis for a CHADS2VASC score of 5. He was noted to have a slow ventricular rate and a Zio monitor was placed which showed several pauses, up to 9.8 seconds. He underwent PPM insertion with Dr Klein on 09/14/22.   On follow up today, patient remains in persistent afib with chronic SOB on exertion. No bleeding issues on anticoagulation.   Today, he denies symptoms of palpitations, chest pain, orthopnea, PND, lower extremity edema, dizziness, presyncope, syncope, snoring, daytime somnolence, bleeding, or neurologic sequela. The patient is tolerating medications without difficulties and is otherwise without complaint today.    Atrial Fibrillation Risk Factors:  he does have symptoms or diagnosis of sleep apnea. he is compliant with CPAP therapy. he does not have a history of rheumatic fever.   he has a BMI of Body mass index is 38.79 kg/m.. Filed Weights   10/12/22 0902  Weight: 133.4 kg    Family History  Problem Relation Age of Onset   Colon cancer Mother    Diabetes Mother    Heart disease Mother    Diabetes Sister    Diabetes Sister    Diabetes Sister      Atrial Fibrillation Management history:  Previous antiarrhythmic drugs: none Previous cardioversions: none Previous ablations:  none CHADS2VASC score: 5 Anticoagulation history: Eliquis   Past Medical History:  Diagnosis Date   A-fib (HCC)    CHF (congestive heart failure) (HCC)    CKD (chronic kidney disease), stage III (HCC)    Colon polyps    Diabetes mellitus type II, uncontrolled    Diabetes mellitus without complication (HCC)    Hypertension    Morbid obesity (HCC)    PVD (peripheral vascular disease) (HCC)    right femoral artery   Sleep apnea    not on CPAP    Sleep apnea    Past Surgical History:  Procedure Laterality Date   COLONOSCOPY N/A 09/16/2015   Procedure: COLONOSCOPY;  Surgeon: Robert M Rourk, MD;  Location: [facility];  Service: Endoscopy;  Laterality: N/A;  8:15 AM - pt has c pap   ENDOVENOUS ABLATION SAPHENOUS VEIN W/ LASER Right 08/26/2017   endovenous laser ablation right greater saphenous vein by James Lawson MD    PACEMAKER IMPLANT N/A 09/14/2022   Procedure: PACEMAKER IMPLANT;  Surgeon: Klein, Steven C, MD;  Location: [facility];  Service: Cardiovascular;  Laterality: N/A;   stab phlebectomy  Right 01/07/2018   stab phlebectomy 10-20 incisions right leg by James Lawson MD     Current Outpatient Medications  Medication Sig Dispense Refill   Accu-Chek FastClix Lancets MISC Check BS QAM - DX: E11.9 100 each 3   atorvastatin (LIPITOR) 40 MG tablet TAKE 1 TABLET BY MOUTH DAILY 90 tablet 3   bisoprolol (ZEBETA) 5 MG tablet Take 1 tablet (  5 mg total) by mouth daily. 90 tablet 3   Blood Glucose Monitoring Suppl (ONE TOUCH ULTRA 2) w/Device KIT USETO CHECK BLOOD SUGAR EVERY MORNING 1 kit 0   cephALEXin (KEFLEX) 500 MG capsule Take 1 capsule (500 mg total) by mouth 3 (three) times daily. 21 capsule 0   Cholecalciferol (VITAMIN D3) 10 MCG (400 UNIT) tablet Take 800 Units by mouth daily.     Continuous Blood Gluc Receiver (FREESTYLE LIBRE 2 READER) DEVI For continuous blood glucose monitoring 1 each 1   Continuous Blood Gluc Sensor (FREESTYLE LIBRE 2 SENSOR) MISC APPLY 1  SENSOR ON THE SKIN EVERY 14 DAYS, USE AS DIRECTED TO CHECK BLOOD SUGAR 2 each 3   ELIQUIS 5 MG TABS tablet TAKE 1 TABLET BY MOUTH TWICE DAILY 60 tablet 3   empagliflozin (JARDIANCE) 25 MG TABS tablet Take 1 tablet (25 mg total) by mouth daily before breakfast. 30 tablet 3   furosemide (LASIX) 40 MG tablet TAKE 1 TABLET BY MOUTH TWICE DAILY 180 tablet 1   glipiZIDE (GLUCOTROL) 10 MG tablet TAKE 1 TABLET BY MOUTH EVERY DAY BEFORE breakfast 90 tablet 3   insulin glargine (LANTUS) 100 UNIT/ML injection Inject 0.25 mLs (25 Units total) into the skin daily. PLEASE INCREASE INSULIN BY 1 UNIT DAILY UNTIL FBG IS <130, THEN HOLD AT THAT DOSE 30 mL 5   ipratropium-albuterol (DUONEB) 0.5-2.5 (3) MG/3ML SOLN Take 3 mLs by nebulization 3 (three) times daily. 360 mL 1   Lancets Misc. (ACCU-CHEK FASTCLIX LANCET) KIT Check BS QAM - DX: E11.9 1 kit 0   metFORMIN (GLUCOPHAGE) 1000 MG tablet TAKE 1 TABLET BY MOUTH TWICE DAILY WITH a meal 180 tablet 3   Multiple Vitamin (MULTIVITAMIN) capsule Take 1 capsule by mouth daily. Mens one a day     Multiple Vitamins-Minerals (ZINC PO) Take 1 tablet by mouth daily.     ONETOUCH ULTRA test strip USE TO CHECK BLOOD SUGAR EVERY MORNING 100 strip 12   potassium chloride SA (KLOR-CON M20) 20 MEQ tablet Take 1 tablet (20 mEq total) by mouth daily. 90 tablet 3   Semaglutide,0.25 or 0.5MG/DOS, (OZEMPIC, 0.25 OR 0.5 MG/DOSE,) 2 MG/3ML SOPN Inject 0.5 mg into the skin once a week. 3 mL 1   No current facility-administered medications for this encounter.    No Known Allergies  Social History   Socioeconomic History   Marital status: Divorced    Spouse name: Not on file   Number of children: 2   Years of education: Not on file   Highest education level: Not on file  Occupational History   Occupation: Self Employed  Tobacco Use   Smoking status: Every Day    Packs/day: 1.50    Years: 30.00    Total pack years: 45.00    Types: Cigarettes    Start date: 04/04/1958    Smokeless tobacco: Never   Tobacco comments:    Currently smoke 1 ppd 10/12/22  Vaping Use   Vaping Use: Never used  Substance and Sexual Activity   Alcohol use: Yes    Alcohol/week: 1.0 standard drink of alcohol    Types: 1 Cans of beer per week    Comment: 1 beer once a month 10/12/22   Drug use: No   Sexual activity: Not on file    Comment: divorced, 2 daughters.  works selling concessions for fairs, etc.  Other Topics Concern   Not on file  Social History Narrative   Not on file   Social Determinants   of Health   Financial Resource Strain: Low Risk  (09/06/2022)   Overall Financial Resource Strain (CARDIA)    Difficulty of Paying Living Expenses: Not hard at all  Food Insecurity: No Food Insecurity (09/06/2022)   Hunger Vital Sign    Worried About Running Out of Food in the Last Year: Never true    Ran Out of Food in the Last Year: Never true  Transportation Needs: No Transportation Needs (09/06/2022)   PRAPARE - Transportation    Lack of Transportation (Medical): No    Lack of Transportation (Non-Medical): No  Physical Activity: Sufficiently Active (09/06/2022)   Exercise Vital Sign    Days of Exercise per Week: 5 days    Minutes of Exercise per Session: 30 min  Stress: No Stress Concern Present (09/06/2022)   [employer] - Occupational Stress Questionnaire    Feeling of Stress : Not at all  Social Connections: Socially Isolated (09/06/2022)   Social Connection and Isolation Panel [NHANES]    Frequency of Communication with Friends and Family: More than three times a week    Frequency of Social Gatherings with Friends and Family: Three times a week    Attends Religious Services: Never    Active Member of Clubs or Organizations: No    Attends Club or Organization Meetings: Never    Marital Status: Divorced  Intimate Partner Violence: Not At Risk (09/06/2022)   Humiliation, Afraid, Rape, and Kick questionnaire    Fear of Current or  Ex-Partner: No    Emotionally Abused: No    Physically Abused: No    Sexually Abused: No     ROS- All systems are reviewed and negative except as per the HPI above.  Physical Exam: Vitals:   10/12/22 0902  BP: (!) 132/90  Pulse: 74  Weight: 133.4 kg  Height: 6' 1" (1.854 m)    GEN- The patient is a well appearing obese male, alert and oriented x 3 today.   Head- normocephalic, atraumatic Eyes-  Sclera clear, conjunctiva pink Ears- hearing intact Oropharynx- clear Neck- supple  Lungs- Clear to ausculation bilaterally, normal work of breathing Heart- irregular rate and rhythm, no rubs or gallops, 2/6 systolic murmur GI- soft, NT, ND, + BS Extremities- no clubbing, cyanosis, or edema MS- no significant deformity or atrophy Skin- no rash or lesion Psych- euthymic mood, full affect Neuro- strength and sensation are intact  Wt Readings from Last 3 Encounters:  10/12/22 133.4 kg  09/28/22 133.4 kg  09/14/22 134.7 kg    EKG today demonstrates  Afib, RBBB Vent. rate 74 BPM PR interval * ms QRS duration 144 ms QT/QTcB 406/450 ms  Echo 02/26/22 demonstrated   1. Left ventricular ejection fraction, by estimation, is 65 to 70%. The  left ventricle has normal function. The left ventricle has no regional  wall motion abnormalities. There is mild left ventricular hypertrophy.  Left ventricular diastolic parameters are indeterminate.   2. Right ventricular systolic function is normal. The right ventricular  size is moderately enlarged. There is moderately elevated pulmonary artery  systolic pressure. The estimated right ventricular systolic pressure is  56.7 mmHg.   3. Left atrial size was mildly dilated.   4. Right atrial size was moderately dilated.   5. The mitral valve is normal in structure. No evidence of mitral valve  regurgitation. No evidence of mitral stenosis.   6. The aortic valve is calcified. There is moderate calcification of the  aortic valve. There is mild  thickening   of the aortic valve. Aortic valve  regurgitation is not visualized. Aortic valve sclerosis is present, with  no evidence of aortic valve  stenosis.   7. Aortic dilatation noted. There is mild dilatation of the aortic root,  measuring 41 mm. There is mild dilatation of the ascending aorta,  measuring 41 mm.   8. The inferior vena cava is dilated in size with <50% respiratory  variability, suggesting right atrial pressure of 15 mmHg.   Epic records are reviewed at length today  CHA2DS2-VASc Score = 5  The patient's score is based upon: CHF History: 1 HTN History: 1 Diabetes History: 1 Stroke History: 0 Vascular Disease History: 1 Age Score: 1 Gender Score: 0       ASSESSMENT AND PLAN: 1. Persistent Atrial Fibrillation (ICD10:  I48.19) The patient's CHA2DS2-VASc score is 5, indicating a 7.2% annual risk of stroke.   Patient remains in persistent afib. We discussed rhythm control options today. Will plan for DCCV.  Continue Eliquis 5 mg BID, patient denies any missed doses.  Continue bisoprolol 5 mg daily  2. Secondary Hypercoagulable State (ICD10:  D68.69) The patient is at significant risk for stroke/thromboembolism based upon his CHA2DS2-VASc Score of 5.  Continue Apixaban (Eliquis).   3. Obesity Body mass index is 38.79 kg/m. Lifestyle modification was discussed at length including regular exercise and weight reduction.  4. Obstructive sleep apnea The importance of adequate treatment of sleep apnea was discussed today in order to improve our ability to maintain sinus rhythm long term. Encouraged compliance with CPAP therapy.   5. Sinus node dysfunction S/p PPM, followed by Dr Klein and the device clinic.  6. HTN Stable, no changes today.  7. Chronic HFpEF Fluid status appears stable.    Follow up in the AF clinic 1-2 weeks post DCCV.    Ricky Chanc Kervin PA-C [facility] [address] [place], [place]  [id number] [phone] 10/12/2022 9:35 AM  

## 2022-10-19 ENCOUNTER — Other Ambulatory Visit: Payer: Medicare Other

## 2022-10-22 ENCOUNTER — Other Ambulatory Visit: Payer: Medicare Other

## 2022-10-22 DIAGNOSIS — I480 Paroxysmal atrial fibrillation: Secondary | ICD-10-CM

## 2022-10-22 DIAGNOSIS — Z8679 Personal history of other diseases of the circulatory system: Secondary | ICD-10-CM | POA: Diagnosis not present

## 2022-10-22 DIAGNOSIS — I482 Chronic atrial fibrillation, unspecified: Secondary | ICD-10-CM | POA: Diagnosis not present

## 2022-10-22 DIAGNOSIS — R0602 Shortness of breath: Secondary | ICD-10-CM | POA: Diagnosis not present

## 2022-10-22 DIAGNOSIS — Z950 Presence of cardiac pacemaker: Secondary | ICD-10-CM | POA: Diagnosis not present

## 2022-10-22 DIAGNOSIS — I10 Essential (primary) hypertension: Secondary | ICD-10-CM | POA: Diagnosis not present

## 2022-10-22 DIAGNOSIS — R0902 Hypoxemia: Secondary | ICD-10-CM | POA: Diagnosis not present

## 2022-10-22 DIAGNOSIS — E1151 Type 2 diabetes mellitus with diabetic peripheral angiopathy without gangrene: Secondary | ICD-10-CM | POA: Diagnosis not present

## 2022-10-22 DIAGNOSIS — I5033 Acute on chronic diastolic (congestive) heart failure: Secondary | ICD-10-CM

## 2022-10-22 DIAGNOSIS — J441 Chronic obstructive pulmonary disease with (acute) exacerbation: Secondary | ICD-10-CM

## 2022-10-22 DIAGNOSIS — I110 Hypertensive heart disease with heart failure: Secondary | ICD-10-CM | POA: Diagnosis not present

## 2022-10-22 DIAGNOSIS — J449 Chronic obstructive pulmonary disease, unspecified: Secondary | ICD-10-CM | POA: Diagnosis not present

## 2022-10-22 DIAGNOSIS — H109 Unspecified conjunctivitis: Secondary | ICD-10-CM | POA: Diagnosis not present

## 2022-10-22 DIAGNOSIS — I5089 Other heart failure: Secondary | ICD-10-CM | POA: Diagnosis not present

## 2022-10-22 DIAGNOSIS — J4489 Other specified chronic obstructive pulmonary disease: Secondary | ICD-10-CM | POA: Diagnosis not present

## 2022-10-22 DIAGNOSIS — F17210 Nicotine dependence, cigarettes, uncomplicated: Secondary | ICD-10-CM | POA: Diagnosis not present

## 2022-10-22 DIAGNOSIS — Z794 Long term (current) use of insulin: Secondary | ICD-10-CM | POA: Diagnosis not present

## 2022-10-22 DIAGNOSIS — E1140 Type 2 diabetes mellitus with diabetic neuropathy, unspecified: Secondary | ICD-10-CM | POA: Diagnosis not present

## 2022-10-22 DIAGNOSIS — Z7984 Long term (current) use of oral hypoglycemic drugs: Secondary | ICD-10-CM | POA: Diagnosis not present

## 2022-10-22 DIAGNOSIS — Z9989 Dependence on other enabling machines and devices: Secondary | ICD-10-CM | POA: Diagnosis not present

## 2022-10-22 DIAGNOSIS — Z7901 Long term (current) use of anticoagulants: Secondary | ICD-10-CM | POA: Diagnosis not present

## 2022-10-22 DIAGNOSIS — E1165 Type 2 diabetes mellitus with hyperglycemia: Secondary | ICD-10-CM

## 2022-10-22 DIAGNOSIS — Z720 Tobacco use: Secondary | ICD-10-CM | POA: Diagnosis not present

## 2022-10-22 DIAGNOSIS — G4733 Obstructive sleep apnea (adult) (pediatric): Secondary | ICD-10-CM | POA: Diagnosis not present

## 2022-10-22 DIAGNOSIS — E785 Hyperlipidemia, unspecified: Secondary | ICD-10-CM | POA: Diagnosis not present

## 2022-10-22 DIAGNOSIS — I2510 Atherosclerotic heart disease of native coronary artery without angina pectoris: Secondary | ICD-10-CM | POA: Diagnosis not present

## 2022-10-22 DIAGNOSIS — I509 Heart failure, unspecified: Secondary | ICD-10-CM | POA: Diagnosis not present

## 2022-10-22 DIAGNOSIS — Z79899 Other long term (current) drug therapy: Secondary | ICD-10-CM | POA: Diagnosis not present

## 2022-10-22 DIAGNOSIS — J811 Chronic pulmonary edema: Secondary | ICD-10-CM | POA: Diagnosis not present

## 2022-10-22 DIAGNOSIS — E119 Type 2 diabetes mellitus without complications: Secondary | ICD-10-CM | POA: Diagnosis not present

## 2022-10-22 DIAGNOSIS — I5030 Unspecified diastolic (congestive) heart failure: Secondary | ICD-10-CM | POA: Diagnosis not present

## 2022-10-22 DIAGNOSIS — Z20822 Contact with and (suspected) exposure to covid-19: Secondary | ICD-10-CM | POA: Diagnosis not present

## 2022-10-22 DIAGNOSIS — I4891 Unspecified atrial fibrillation: Secondary | ICD-10-CM | POA: Diagnosis not present

## 2022-10-22 DIAGNOSIS — Z1152 Encounter for screening for COVID-19: Secondary | ICD-10-CM | POA: Diagnosis not present

## 2022-10-22 NOTE — Addendum Note (Signed)
Addended by: Randal Buba K on: 10/22/2022 01:05 PM   Modules accepted: Orders

## 2022-10-22 NOTE — Patient Instructions (Incomplete)
Continue micardis 80 /12.5 one half daily but call if your insurance prefers a different medication in the same category (ARB)    If you are satisfied with your treatment plan let your doctor know and he/she can either refill your medications or you can return here when your prescription runs out.     If in any way you are not 100% satisfied,  please tell us.  If 100% better, tell your friends!

## 2022-10-22 NOTE — Progress Notes (Deleted)
Subjective:    Patient ID: Steven Henry, male    DOB: Feb 14, 1951, MRN: [id number]  HPI   67  yowm active smoker referred by Dr Nadyne Coombes for eval of sob and cough  03/03/2013 1st pulmonary eval cc doe x several years x indolent onset to point where problem walking across a parking lot better p rx with "a pill" by Dr Jenkins Rouge and now more limited by calf pain than sob but now bothered by cough on ACEi since 12/2012 indolent x 2 weeks, dry, day > night rec Stop lisinopril (zestril, prinivil) due to cough  Start micardis 80/12.5  One half daily and let your doctor know this when you get your blood work called in The key is to stop smoking completely before smoking completely stops you - it's not too late!   04/20/2013 Re-establish ov/Steven Henry re GOLD 0 COPD Chief Complaint  Patient presents with   Follow-up with PFT    Pt states that he feels breathing has improved some since his last visit. Cough has resolved.    doe much better,  walking mall is good, hills are a problem or if he gets in a real hurry. Rec Continue micardis 80 /12.5 one half daily but call if your insurance prefers a different medication in the same category (ARB)      10/23/2022  Re-establish  ov/Steven Henry office/Steven Henry re: *** maint on ***  No chief complaint on file.   Dyspnea:  *** Cough: *** Sleeping: *** SABA use: *** 02: *** Covid status: *** Lung cancer screening: ***   No obvious day to day or daytime variability or assoc excess/ purulent sputum or mucus plugs or hemoptysis or cp or chest tightness, subjective wheeze or overt sinus or hb symptoms.   *** without nocturnal  or early am exacerbation  of respiratory  c/o's or need for noct saba. Also denies any obvious fluctuation of symptoms with weather or environmental changes or other aggravating or alleviating factors except as outlined above   No unusual exposure hx or h/o childhood pna/ asthma or knowledge of premature birth.  Current Allergies, Complete  Past Medical History, Past Surgical History, Family History, and Social History were reviewed in [employer] record.  ROS  The following are not active complaints unless bolded Hoarseness, sore throat, dysphagia, dental problems, itching, sneezing,  nasal congestion or discharge of excess mucus or purulent secretions, ear ache,   fever, chills, sweats, unintended wt loss or wt gain, classically pleuritic or exertional cp,  orthopnea pnd or arm/hand swelling  or leg swelling, presyncope, palpitations, abdominal pain, anorexia, nausea, vomiting, diarrhea  or change in bowel habits or change in bladder habits, change in stools or change in urine, dysuria, hematuria,  rash, arthralgias, visual complaints, headache, numbness, weakness or ataxia or problems with walking or coordination,  change in mood or  memory.        No outpatient medications have been marked as taking for the 10/23/22 encounter (Appointment) with Tanda Rockers, MD.                 Objective:   Physical Exam Wts   10/23/2022        ***  04/20/2013        342     03/03/13 344 lb (156.037 kg)  02/18/13 327 lb (148.326 kg)  01/08/13 352 lb (159.666 kg)      Assessment & Plan:   Outpatient Encounter Prescriptions as of 04/20/2013  Medication Sig Dispense  Refill   atorvastatin (LIPITOR) 20 MG tablet Take 20 mg by mouth daily.       metFORMIN (GLUCOPHAGE) 1000 MG tablet Take 1 tablet (1,000 mg total) by mouth 2 (two) times daily with a meal.  180 tablet  3   telmisartan-hydrochlorothiazide (MICARDIS HCT) 80-12.5 MG per tablet Take 0.5 tablets by mouth daily.  15 tablet  11   [DISCONTINUED] telmisartan-hydrochlorothiazide (MICARDIS HCT) 80-12.5 MG per tablet Take 0.5 tablets by mouth daily.       spironolactone (ALDACTONE) 50 MG tablet Take 50 mg by mouth daily.       [DISCONTINUED] lisinopril (PRINIVIL,ZESTRIL) 5 MG tablet Take 5 mg by mouth daily.       No facility-administered encounter  medications on file as of 04/20/2013.

## 2022-10-23 ENCOUNTER — Institutional Professional Consult (permissible substitution): Payer: Medicare Other | Admitting: Internal Medicine

## 2022-10-23 DIAGNOSIS — E119 Type 2 diabetes mellitus without complications: Secondary | ICD-10-CM | POA: Diagnosis not present

## 2022-10-23 DIAGNOSIS — Z79899 Other long term (current) drug therapy: Secondary | ICD-10-CM | POA: Diagnosis not present

## 2022-10-23 DIAGNOSIS — I4891 Unspecified atrial fibrillation: Secondary | ICD-10-CM | POA: Diagnosis not present

## 2022-10-23 DIAGNOSIS — I5030 Unspecified diastolic (congestive) heart failure: Secondary | ICD-10-CM | POA: Diagnosis not present

## 2022-10-23 DIAGNOSIS — H109 Unspecified conjunctivitis: Secondary | ICD-10-CM | POA: Diagnosis not present

## 2022-10-23 DIAGNOSIS — J449 Chronic obstructive pulmonary disease, unspecified: Secondary | ICD-10-CM | POA: Diagnosis not present

## 2022-10-23 DIAGNOSIS — Z9989 Dependence on other enabling machines and devices: Secondary | ICD-10-CM | POA: Diagnosis not present

## 2022-10-23 DIAGNOSIS — Z950 Presence of cardiac pacemaker: Secondary | ICD-10-CM | POA: Diagnosis not present

## 2022-10-23 LAB — PROTIME-INR
INR: 1.1
Prothrombin Time: 11.9 s — ABNORMAL HIGH (ref 9.0–11.5)

## 2022-10-23 LAB — BASIC METABOLIC PANEL
BUN: 16 mg/dL (ref 7–25)
CO2: 37 mmol/L — ABNORMAL HIGH (ref 20–32)
Calcium: 9.6 mg/dL (ref 8.6–10.3)
Chloride: 94 mmol/L — ABNORMAL LOW (ref 98–110)
Creat: 0.89 mg/dL (ref 0.70–1.28)
Glucose, Bld: 213 mg/dL — ABNORMAL HIGH (ref 65–99)
Potassium: 4 mmol/L (ref 3.5–5.3)
Sodium: 141 mmol/L (ref 135–146)

## 2022-10-23 LAB — CBC WITH DIFFERENTIAL/PLATELET
Absolute Monocytes: 1103 cells/uL — ABNORMAL HIGH (ref 200–950)
Basophils Absolute: 42 cells/uL (ref 0–200)
Basophils Relative: 0.4 %
Eosinophils Absolute: 84 cells/uL (ref 15–500)
Eosinophils Relative: 0.8 %
HCT: 48.1 % (ref 38.5–50.0)
Hemoglobin: 16.3 g/dL (ref 13.2–17.1)
Lymphs Abs: 1113 cells/uL (ref 850–3900)
MCH: 32.3 pg (ref 27.0–33.0)
MCHC: 33.9 g/dL (ref 32.0–36.0)
MCV: 95.4 fL (ref 80.0–100.0)
MPV: 12.1 fL (ref 7.5–12.5)
Monocytes Relative: 10.5 %
Neutro Abs: 8159 cells/uL — ABNORMAL HIGH (ref 1500–7800)
Neutrophils Relative %: 77.7 %
Platelets: 195 10*3/uL (ref 140–400)
RBC: 5.04 10*6/uL (ref 4.20–5.80)
RDW: 13.1 % (ref 11.0–15.0)
Total Lymphocyte: 10.6 %
WBC: 10.5 10*3/uL (ref 3.8–10.8)

## 2022-10-24 DIAGNOSIS — F17210 Nicotine dependence, cigarettes, uncomplicated: Secondary | ICD-10-CM | POA: Diagnosis not present

## 2022-10-24 DIAGNOSIS — Z9989 Dependence on other enabling machines and devices: Secondary | ICD-10-CM | POA: Diagnosis not present

## 2022-10-24 DIAGNOSIS — E1151 Type 2 diabetes mellitus with diabetic peripheral angiopathy without gangrene: Secondary | ICD-10-CM | POA: Diagnosis not present

## 2022-10-24 DIAGNOSIS — G4733 Obstructive sleep apnea (adult) (pediatric): Secondary | ICD-10-CM | POA: Diagnosis not present

## 2022-10-24 DIAGNOSIS — E1165 Type 2 diabetes mellitus with hyperglycemia: Secondary | ICD-10-CM | POA: Diagnosis not present

## 2022-10-24 DIAGNOSIS — H109 Unspecified conjunctivitis: Secondary | ICD-10-CM | POA: Insufficient documentation

## 2022-10-24 DIAGNOSIS — I110 Hypertensive heart disease with heart failure: Secondary | ICD-10-CM | POA: Diagnosis not present

## 2022-10-24 DIAGNOSIS — I4891 Unspecified atrial fibrillation: Secondary | ICD-10-CM | POA: Diagnosis not present

## 2022-10-24 DIAGNOSIS — I5033 Acute on chronic diastolic (congestive) heart failure: Secondary | ICD-10-CM | POA: Diagnosis not present

## 2022-10-24 DIAGNOSIS — E1140 Type 2 diabetes mellitus with diabetic neuropathy, unspecified: Secondary | ICD-10-CM | POA: Diagnosis not present

## 2022-10-24 DIAGNOSIS — J4489 Other specified chronic obstructive pulmonary disease: Secondary | ICD-10-CM | POA: Diagnosis not present

## 2022-10-25 DIAGNOSIS — E1140 Type 2 diabetes mellitus with diabetic neuropathy, unspecified: Secondary | ICD-10-CM | POA: Diagnosis not present

## 2022-10-25 DIAGNOSIS — J4489 Other specified chronic obstructive pulmonary disease: Secondary | ICD-10-CM | POA: Diagnosis not present

## 2022-10-25 DIAGNOSIS — H109 Unspecified conjunctivitis: Secondary | ICD-10-CM | POA: Diagnosis not present

## 2022-10-25 DIAGNOSIS — I4891 Unspecified atrial fibrillation: Secondary | ICD-10-CM | POA: Diagnosis not present

## 2022-10-25 DIAGNOSIS — F17210 Nicotine dependence, cigarettes, uncomplicated: Secondary | ICD-10-CM | POA: Diagnosis not present

## 2022-10-25 DIAGNOSIS — I10 Essential (primary) hypertension: Secondary | ICD-10-CM | POA: Diagnosis not present

## 2022-10-25 DIAGNOSIS — I509 Heart failure, unspecified: Secondary | ICD-10-CM | POA: Insufficient documentation

## 2022-10-25 DIAGNOSIS — E1151 Type 2 diabetes mellitus with diabetic peripheral angiopathy without gangrene: Secondary | ICD-10-CM | POA: Diagnosis not present

## 2022-10-25 DIAGNOSIS — G4733 Obstructive sleep apnea (adult) (pediatric): Secondary | ICD-10-CM | POA: Diagnosis not present

## 2022-10-25 DIAGNOSIS — E1165 Type 2 diabetes mellitus with hyperglycemia: Secondary | ICD-10-CM | POA: Diagnosis not present

## 2022-10-26 ENCOUNTER — Emergency Department (HOSPITAL_COMMUNITY): Payer: Medicare Other

## 2022-10-26 ENCOUNTER — Inpatient Hospital Stay (HOSPITAL_COMMUNITY)
Admission: EM | Admit: 2022-10-26 | Discharge: 2022-10-29 | DRG: 291 | Disposition: A | Discharge status: Home / Self Care | Payer: Medicare Other | Attending: Internal Medicine | Admitting: Internal Medicine

## 2022-10-26 ENCOUNTER — Other Ambulatory Visit: Payer: Self-pay

## 2022-10-26 ENCOUNTER — Encounter (HOSPITAL_COMMUNITY): Payer: Self-pay | Admitting: Emergency Medicine

## 2022-10-26 ENCOUNTER — Inpatient Hospital Stay (HOSPITAL_COMMUNITY): Payer: Medicare Other

## 2022-10-26 DIAGNOSIS — I509 Heart failure, unspecified: Principal | ICD-10-CM

## 2022-10-26 DIAGNOSIS — E669 Obesity, unspecified: Secondary | ICD-10-CM

## 2022-10-26 DIAGNOSIS — Z6837 Body mass index (BMI) 37.0-37.9, adult: Secondary | ICD-10-CM

## 2022-10-26 DIAGNOSIS — E1122 Type 2 diabetes mellitus with diabetic chronic kidney disease: Secondary | ICD-10-CM | POA: Diagnosis not present

## 2022-10-26 DIAGNOSIS — Z79899 Other long term (current) drug therapy: Secondary | ICD-10-CM

## 2022-10-26 DIAGNOSIS — I110 Hypertensive heart disease with heart failure: Secondary | ICD-10-CM | POA: Diagnosis not present

## 2022-10-26 DIAGNOSIS — J479 Bronchiectasis, uncomplicated: Secondary | ICD-10-CM | POA: Diagnosis present

## 2022-10-26 DIAGNOSIS — J441 Chronic obstructive pulmonary disease with (acute) exacerbation: Secondary | ICD-10-CM | POA: Diagnosis present

## 2022-10-26 DIAGNOSIS — J9622 Acute and chronic respiratory failure with hypercapnia: Secondary | ICD-10-CM | POA: Diagnosis present

## 2022-10-26 DIAGNOSIS — E1165 Type 2 diabetes mellitus with hyperglycemia: Secondary | ICD-10-CM | POA: Diagnosis not present

## 2022-10-26 DIAGNOSIS — I5033 Acute on chronic diastolic (congestive) heart failure: Secondary | ICD-10-CM

## 2022-10-26 DIAGNOSIS — K219 Gastro-esophageal reflux disease without esophagitis: Secondary | ICD-10-CM | POA: Diagnosis not present

## 2022-10-26 DIAGNOSIS — E1151 Type 2 diabetes mellitus with diabetic peripheral angiopathy without gangrene: Secondary | ICD-10-CM | POA: Diagnosis not present

## 2022-10-26 DIAGNOSIS — Z1152 Encounter for screening for COVID-19: Secondary | ICD-10-CM | POA: Diagnosis not present

## 2022-10-26 DIAGNOSIS — Z7985 Long-term (current) use of injectable non-insulin antidiabetic drugs: Secondary | ICD-10-CM

## 2022-10-26 DIAGNOSIS — D72828 Other elevated white blood cell count: Secondary | ICD-10-CM | POA: Diagnosis not present

## 2022-10-26 DIAGNOSIS — I4891 Unspecified atrial fibrillation: Secondary | ICD-10-CM | POA: Diagnosis present

## 2022-10-26 DIAGNOSIS — J9621 Acute and chronic respiratory failure with hypoxia: Secondary | ICD-10-CM | POA: Diagnosis not present

## 2022-10-26 DIAGNOSIS — N183 Chronic kidney disease, stage 3 unspecified: Secondary | ICD-10-CM | POA: Diagnosis present

## 2022-10-26 DIAGNOSIS — Z800 Family history of malignant neoplasm of digestive organs: Secondary | ICD-10-CM

## 2022-10-26 DIAGNOSIS — D72829 Elevated white blood cell count, unspecified: Secondary | ICD-10-CM | POA: Diagnosis not present

## 2022-10-26 DIAGNOSIS — I2489 Other forms of acute ischemic heart disease: Secondary | ICD-10-CM | POA: Diagnosis present

## 2022-10-26 DIAGNOSIS — I480 Paroxysmal atrial fibrillation: Secondary | ICD-10-CM | POA: Diagnosis not present

## 2022-10-26 DIAGNOSIS — G9341 Metabolic encephalopathy: Secondary | ICD-10-CM | POA: Diagnosis present

## 2022-10-26 DIAGNOSIS — Z7901 Long term (current) use of anticoagulants: Secondary | ICD-10-CM

## 2022-10-26 DIAGNOSIS — Z8719 Personal history of other diseases of the digestive system: Secondary | ICD-10-CM

## 2022-10-26 DIAGNOSIS — Z794 Long term (current) use of insulin: Secondary | ICD-10-CM

## 2022-10-26 DIAGNOSIS — K5900 Constipation, unspecified: Secondary | ICD-10-CM | POA: Diagnosis not present

## 2022-10-26 DIAGNOSIS — R7989 Other specified abnormal findings of blood chemistry: Secondary | ICD-10-CM | POA: Diagnosis not present

## 2022-10-26 DIAGNOSIS — G4730 Sleep apnea, unspecified: Secondary | ICD-10-CM | POA: Diagnosis present

## 2022-10-26 DIAGNOSIS — I10 Essential (primary) hypertension: Secondary | ICD-10-CM | POA: Diagnosis not present

## 2022-10-26 DIAGNOSIS — R5381 Other malaise: Secondary | ICD-10-CM | POA: Diagnosis not present

## 2022-10-26 DIAGNOSIS — I130 Hypertensive heart and chronic kidney disease with heart failure and stage 1 through stage 4 chronic kidney disease, or unspecified chronic kidney disease: Principal | ICD-10-CM | POA: Diagnosis present

## 2022-10-26 DIAGNOSIS — E782 Mixed hyperlipidemia: Secondary | ICD-10-CM | POA: Diagnosis present

## 2022-10-26 DIAGNOSIS — F17210 Nicotine dependence, cigarettes, uncomplicated: Secondary | ICD-10-CM | POA: Diagnosis not present

## 2022-10-26 DIAGNOSIS — E66812 Obesity, class 2: Secondary | ICD-10-CM

## 2022-10-26 DIAGNOSIS — Z7984 Long term (current) use of oral hypoglycemic drugs: Secondary | ICD-10-CM

## 2022-10-26 DIAGNOSIS — Z833 Family history of diabetes mellitus: Secondary | ICD-10-CM

## 2022-10-26 DIAGNOSIS — E876 Hypokalemia: Secondary | ICD-10-CM | POA: Diagnosis not present

## 2022-10-26 DIAGNOSIS — E119 Type 2 diabetes mellitus without complications: Secondary | ICD-10-CM

## 2022-10-26 DIAGNOSIS — R0602 Shortness of breath: Secondary | ICD-10-CM | POA: Diagnosis not present

## 2022-10-26 DIAGNOSIS — Z8249 Family history of ischemic heart disease and other diseases of the circulatory system: Secondary | ICD-10-CM

## 2022-10-26 DIAGNOSIS — I5032 Chronic diastolic (congestive) heart failure: Secondary | ICD-10-CM | POA: Diagnosis present

## 2022-10-26 LAB — COMPREHENSIVE METABOLIC PANEL
ALT: 16 U/L (ref 0–44)
ALT: 15 U/L (ref 0–44)
AST: 23 U/L (ref 15–41)
AST: 16 U/L (ref 15–41)
Albumin: 3.4 g/dL — ABNORMAL LOW (ref 3.5–5.0)
Albumin: 3.4 g/dL — ABNORMAL LOW (ref 3.5–5.0)
Alkaline Phosphatase: 72 U/L (ref 38–126)
Alkaline Phosphatase: 68 U/L (ref 38–126)
Anion gap: 13 (ref 5–15)
Anion gap: 6 (ref 5–15)
BUN: 22 mg/dL (ref 8–23)
BUN: 22 mg/dL (ref 8–23)
CO2: 35 mmol/L — ABNORMAL HIGH (ref 22–32)
CO2: 36 mmol/L — ABNORMAL HIGH (ref 22–32)
Calcium: 9 mg/dL (ref 8.9–10.3)
Calcium: 8.7 mg/dL — ABNORMAL LOW (ref 8.9–10.3)
Chloride: 88 mmol/L — ABNORMAL LOW (ref 98–111)
Chloride: 95 mmol/L — ABNORMAL LOW (ref 98–111)
Creatinine, Ser: 0.98 mg/dL (ref 0.61–1.24)
Creatinine, Ser: 0.86 mg/dL (ref 0.61–1.24)
GFR, Estimated: >60 mL/min (ref >60)
GFR, Estimated: >60 mL/min (ref >60)
Glucose, Bld: 197 mg/dL — ABNORMAL HIGH (ref 70–99)
Glucose, Bld: 182 mg/dL — ABNORMAL HIGH (ref 70–99)
Potassium: 3.4 mmol/L — ABNORMAL LOW (ref 3.5–5.1)
Potassium: 3.5 mmol/L (ref 3.5–5.1)
Sodium: 136 mmol/L (ref 135–145)
Sodium: 137 mmol/L (ref 135–145)
Total Bilirubin: 0.6 mg/dL (ref 0.3–1.2)
Total Bilirubin: 0.9 mg/dL (ref 0.3–1.2)
Total Protein: 8.1 g/dL (ref 6.5–8.1)
Total Protein: 7.9 g/dL (ref 6.5–8.1)

## 2022-10-26 LAB — CBC WITH DIFFERENTIAL/PLATELET
Abs Immature Granulocytes: 0.09 10*3/uL — ABNORMAL HIGH (ref 0.00–0.07)
Abs Immature Granulocytes: 0.19 10*3/uL — ABNORMAL HIGH (ref 0.00–0.07)
Basophils Absolute: 0.1 10*3/uL (ref 0.0–0.1)
Basophils Absolute: 0.1 10*3/uL (ref 0.0–0.1)
Basophils Relative: 1 %
Basophils Relative: 1 %
Eosinophils Absolute: 0 10*3/uL (ref 0.0–0.5)
Eosinophils Absolute: 0 10*3/uL (ref 0.0–0.5)
Eosinophils Relative: 0 %
Eosinophils Relative: 0 %
HCT: 48.6 % (ref 39.0–52.0)
HCT: 48.2 % (ref 39.0–52.0)
Hemoglobin: 15.2 g/dL (ref 13.0–17.0)
Hemoglobin: 15.1 g/dL (ref 13.0–17.0)
Immature Granulocytes: 1 %
Immature Granulocytes: 1 %
Lymphocytes Relative: 7 %
Lymphocytes Relative: 6 %
Lymphs Abs: 0.9 10*3/uL (ref 0.7–4.0)
Lymphs Abs: 0.8 10*3/uL (ref 0.7–4.0)
MCH: 31.9 pg (ref 26.0–34.0)
MCH: 31.9 pg (ref 26.0–34.0)
MCHC: 31.3 g/dL (ref 30.0–36.0)
MCHC: 31.3 g/dL (ref 30.0–36.0)
MCV: 101.9 fL — ABNORMAL HIGH (ref 80.0–100.0)
MCV: 101.7 fL — ABNORMAL HIGH (ref 80.0–100.0)
Monocytes Absolute: 1.1 10*3/uL — ABNORMAL HIGH (ref 0.1–1.0)
Monocytes Absolute: 1 10*3/uL (ref 0.1–1.0)
Monocytes Relative: 8 %
Monocytes Relative: 7 %
Neutro Abs: 11.1 10*3/uL — ABNORMAL HIGH (ref 1.7–7.7)
Neutro Abs: 11.7 10*3/uL — ABNORMAL HIGH (ref 1.7–7.7)
Neutrophils Relative %: 83 %
Neutrophils Relative %: 85 %
Platelets: 215 10*3/uL (ref 150–400)
Platelets: 206 10*3/uL (ref 150–400)
RBC: 4.77 MIL/uL (ref 4.22–5.81)
RBC: 4.74 MIL/uL (ref 4.22–5.81)
RDW: 13.9 % (ref 11.5–15.5)
RDW: 14 % (ref 11.5–15.5)
WBC: 13.3 10*3/uL — ABNORMAL HIGH (ref 4.0–10.5)
WBC: 13.7 10*3/uL — ABNORMAL HIGH (ref 4.0–10.5)
nRBC: 0 % (ref 0.0–0.2)
nRBC: 0 % (ref 0.0–0.2)

## 2022-10-26 LAB — TROPONIN I (HIGH SENSITIVITY)
Troponin I (High Sensitivity): 19 ng/L — ABNORMAL HIGH (ref <18)
Troponin I (High Sensitivity): 22 ng/L — ABNORMAL HIGH (ref <18)

## 2022-10-26 LAB — ECHOCARDIOGRAM COMPLETE
AR max vel: 1.41 cm2
AV Area VTI: 1.27 cm2
AV Area mean vel: 1.35 cm2
AV Mean grad: 34.5 mmHg
AV Peak grad: 68.9 mmHg
Ao pk vel: 4.15 m/s
Area-P 1/2: 2.9 cm2
Height: 73 in
MV M vel: 4.95 m/s
MV Peak grad: 98 mmHg
S' Lateral: 4.1 cm
Weight: 4585.57 oz

## 2022-10-26 LAB — BLOOD GAS, VENOUS
Acid-Base Excess: 15 mmol/L — ABNORMAL HIGH (ref 0.0–2.0)
Bicarbonate: 47.1 mmol/L — ABNORMAL HIGH (ref 20.0–28.0)
Drawn by: 7478
O2 Saturation: 90.3 %
Patient temperature: 36.5
pCO2, Ven: 103 mmHg (ref 44–60)
pH, Ven: 7.27 (ref 7.25–7.43)
pO2, Ven: 56 mmHg — ABNORMAL HIGH (ref 32–45)

## 2022-10-26 LAB — GLUCOSE, CAPILLARY
Glucose-Capillary: 191 mg/dL — ABNORMAL HIGH (ref 70–99)
Glucose-Capillary: 164 mg/dL — ABNORMAL HIGH (ref 70–99)
Glucose-Capillary: 163 mg/dL — ABNORMAL HIGH (ref 70–99)
Glucose-Capillary: 183 mg/dL — ABNORMAL HIGH (ref 70–99)
Glucose-Capillary: 237 mg/dL — ABNORMAL HIGH (ref 70–99)

## 2022-10-26 LAB — PROCALCITONIN: Procalcitonin: 0.19 ng/mL

## 2022-10-26 LAB — RESP PANEL BY RT-PCR (RSV, FLU A&B, COVID)  RVPGX2
Influenza A by PCR: NEGATIVE
Influenza B by PCR: NEGATIVE
Resp Syncytial Virus by PCR: NEGATIVE
SARS Coronavirus 2 by RT PCR: NEGATIVE

## 2022-10-26 LAB — TSH: TSH: 0.902 u[IU]/mL (ref 0.350–4.500)

## 2022-10-26 LAB — MAGNESIUM: Magnesium: 2.5 mg/dL — ABNORMAL HIGH (ref 1.7–2.4)

## 2022-10-26 LAB — MRSA NEXT GEN BY PCR, NASAL: MRSA by PCR Next Gen: NOT DETECTED

## 2022-10-26 LAB — CBG MONITORING, ED: Glucose-Capillary: 190 mg/dL — ABNORMAL HIGH (ref 70–99)

## 2022-10-26 LAB — BRAIN NATRIURETIC PEPTIDE: B Natriuretic Peptide: 209 pg/mL — ABNORMAL HIGH (ref 0.0–100.0)

## 2022-10-26 MED ORDER — FUROSEMIDE 10 MG/ML IJ SOLN
60.0000 mg | Freq: Two times a day (BID) | INTRAMUSCULAR | Status: DC
  Administered 2022-10-26 – 2022-10-29 (×7): 60 mg via INTRAVENOUS
  Filled 2022-10-26 (×7): qty 6

## 2022-10-26 MED ORDER — FUROSEMIDE 10 MG/ML IJ SOLN
80.0000 mg | Freq: Once | INTRAMUSCULAR | Status: AC
  Administered 2022-10-26: 80 mg via INTRAVENOUS
  Filled 2022-10-26: qty 8

## 2022-10-26 MED ORDER — IPRATROPIUM-ALBUTEROL 0.5-2.5 (3) MG/3ML IN SOLN
3.0000 mL | Freq: Four times a day (QID) | RESPIRATORY_TRACT | Status: DC
  Administered 2022-10-26 – 2022-10-29 (×14): 3 mL via RESPIRATORY_TRACT
  Filled 2022-10-26 (×14): qty 3

## 2022-10-26 MED ORDER — METHYLPREDNISOLONE SODIUM SUCC 40 MG IJ SOLR
40.0000 mg | Freq: Two times a day (BID) | INTRAMUSCULAR | Status: DC
  Administered 2022-10-26 – 2022-10-29 (×7): 40 mg via INTRAVENOUS
  Filled 2022-10-26 (×7): qty 1

## 2022-10-26 MED ORDER — POTASSIUM CHLORIDE 20 MEQ PO PACK
20.0000 meq | PACK | Freq: Once | ORAL | Status: AC
  Administered 2022-10-26: 20 meq via ORAL
  Filled 2022-10-26: qty 1

## 2022-10-26 MED ORDER — ATORVASTATIN CALCIUM 40 MG PO TABS
40.0000 mg | ORAL_TABLET | Freq: Every evening | ORAL | Status: DC
  Administered 2022-10-26 – 2022-10-28 (×3): 40 mg via ORAL
  Filled 2022-10-26 (×3): qty 1

## 2022-10-26 MED ORDER — ALBUTEROL SULFATE (2.5 MG/3ML) 0.083% IN NEBU: 2.5000 mg | INHALATION_SOLUTION | RESPIRATORY_TRACT | Status: DC | PRN

## 2022-10-26 MED ORDER — OXYCODONE HCL 5 MG PO TABS: 5.0000 mg | ORAL_TABLET | ORAL | Status: DC | PRN

## 2022-10-26 MED ORDER — BUDESONIDE 0.5 MG/2ML IN SUSP
0.5000 mg | Freq: Two times a day (BID) | RESPIRATORY_TRACT | Status: DC
  Administered 2022-10-26 – 2022-10-29 (×7): 0.5 mg via RESPIRATORY_TRACT
  Filled 2022-10-26 (×7): qty 2

## 2022-10-26 MED ORDER — MORPHINE SULFATE (PF) 2 MG/ML IV SOLN: 2.0000 mg | INTRAVENOUS | Status: DC | PRN

## 2022-10-26 MED ORDER — BISOPROLOL FUMARATE 5 MG PO TABS
5.0000 mg | ORAL_TABLET | Freq: Every evening | ORAL | Status: DC
  Administered 2022-10-26 – 2022-10-28 (×3): 5 mg via ORAL
  Filled 2022-10-26 (×3): qty 1

## 2022-10-26 MED ORDER — INSULIN ASPART 100 UNIT/ML IJ SOLN
0.0000 [IU] | Freq: Three times a day (TID) | INTRAMUSCULAR | Status: DC
  Administered 2022-10-26 (×3): 3 [IU] via SUBCUTANEOUS
  Administered 2022-10-27: 8 [IU] via SUBCUTANEOUS
  Administered 2022-10-27: 5 [IU] via SUBCUTANEOUS
  Administered 2022-10-27 – 2022-10-28 (×2): 3 [IU] via SUBCUTANEOUS
  Administered 2022-10-28: 5 [IU] via SUBCUTANEOUS
  Administered 2022-10-29: 8 [IU] via SUBCUTANEOUS
  Administered 2022-10-29: 3 [IU] via SUBCUTANEOUS

## 2022-10-26 MED ORDER — PERFLUTREN LIPID MICROSPHERE
1.0000 mL | INTRAVENOUS | Status: AC | PRN
  Administered 2022-10-26: 4 mL via INTRAVENOUS

## 2022-10-26 MED ORDER — ARFORMOTEROL TARTRATE 15 MCG/2ML IN NEBU
15.0000 ug | INHALATION_SOLUTION | Freq: Two times a day (BID) | RESPIRATORY_TRACT | Status: DC
  Administered 2022-10-26 – 2022-10-29 (×7): 15 ug via RESPIRATORY_TRACT
  Filled 2022-10-26 (×7): qty 2

## 2022-10-26 MED ORDER — ONDANSETRON HCL 4 MG/2ML IJ SOLN: 4.0000 mg | Freq: Four times a day (QID) | INTRAMUSCULAR | Status: DC | PRN

## 2022-10-26 MED ORDER — INSULIN ASPART 100 UNIT/ML IJ SOLN
0.0000 [IU] | Freq: Every day | INTRAMUSCULAR | Status: DC
  Administered 2022-10-27 – 2022-10-28 (×2): 2 [IU] via SUBCUTANEOUS

## 2022-10-26 MED ORDER — IPRATROPIUM BROMIDE 0.02 % IN SOLN
RESPIRATORY_TRACT | Status: AC
  Administered 2022-10-26: 0.5 mg
  Filled 2022-10-26: qty 2.5

## 2022-10-26 MED ORDER — INSULIN DETEMIR 100 UNIT/ML ~~LOC~~ SOLN
7.0000 [IU] | Freq: Every day | SUBCUTANEOUS | Status: DC
  Administered 2022-10-26 – 2022-10-27 (×2): 7 [IU] via SUBCUTANEOUS
  Filled 2022-10-26 (×3): qty 0.07

## 2022-10-26 MED ORDER — ALBUTEROL (5 MG/ML) CONTINUOUS INHALATION SOLN
10.0000 mg/h | INHALATION_SOLUTION | RESPIRATORY_TRACT | Status: DC
  Administered 2022-10-26: 10 mg/h via RESPIRATORY_TRACT

## 2022-10-26 MED ORDER — ONDANSETRON HCL 4 MG PO TABS: 4.0000 mg | ORAL_TABLET | Freq: Four times a day (QID) | ORAL | Status: DC | PRN

## 2022-10-26 MED ORDER — ACETAMINOPHEN 325 MG PO TABS: 650.0000 mg | ORAL_TABLET | Freq: Four times a day (QID) | ORAL | Status: DC | PRN

## 2022-10-26 MED ORDER — APIXABAN 5 MG PO TABS
5.0000 mg | ORAL_TABLET | Freq: Two times a day (BID) | ORAL | Status: DC
  Administered 2022-10-26 – 2022-10-29 (×7): 5 mg via ORAL
  Filled 2022-10-26 (×7): qty 1

## 2022-10-26 MED ORDER — CHLORHEXIDINE GLUCONATE CLOTH 2 % EX PADS
6.0000 | MEDICATED_PAD | Freq: Every day | CUTANEOUS | Status: DC
  Administered 2022-10-26 – 2022-10-27 (×2): 6 via TOPICAL

## 2022-10-26 MED ORDER — ACETAMINOPHEN 650 MG RE SUPP: 650.0000 mg | Freq: Four times a day (QID) | RECTAL | Status: DC | PRN

## 2022-10-26 NOTE — Assessment & Plan Note (Signed)
-  Flat elevation in the setting of demand ischemia secondary to COPD and CHF exacerbation. -Patient denies chest pain. -No acute ischemic changes appreciated on EKG or telemetry throughout hospitalization -Continue patient follow-up with cardiology service.

## 2022-10-26 NOTE — ED Provider Notes (Signed)
[place] Provider Note   CSN: [id number] Arrival date & time: 10/26/22  0118     History  Chief Complaint  Patient presents with   Shortness of Breath    Steven Henry is a 72 y.o. male.  Patient presents to the emergency department for evaluation of shortness of breath.  Patient was discharged from [facility] several hours ago.  Records indicate that he was treated for congestive heart failure there.  Patient reports shortness of breath at discharge with worsened since getting home.  Patient does endorse cough productive of phlegm.  No fevers.  No chest pain.       Home Medications Prior to Admission medications   Medication Sig Start Date End Date Taking? Authorizing Provider  Accu-Chek FastClix Lancets MISC Check BS QAM - DX: E11.9 02/12/19   [name], MD  atorvastatin (LIPITOR) 40 MG tablet TAKE 1 TABLET BY MOUTH DAILY Patient taking differently: Take 40 mg by mouth every evening. 08/13/22   [name], MD  bisoprolol (ZEBETA) 5 MG tablet Take 1 tablet (5 mg total) by mouth daily. Patient taking differently: Take 5 mg by mouth every evening. 09/14/22   Ledora Bottcher, PA  Blood Glucose Monitoring Suppl (ONE TOUCH ULTRA 2) w/Device KIT USETO CHECK BLOOD SUGAR EVERY MORNING 05/26/21   [name], MD  cephALEXin (KEFLEX) 500 MG capsule Take 1 capsule (500 mg total) by mouth 3 (three) times daily. Patient not taking: Reported on 10/25/2022 09/28/22   [name], MD  Cholecalciferol (VITAMIN D3) 10 MCG (400 UNIT) tablet Take 800 Units by mouth daily. 09/02/20   [provider]  Continuous Blood Gluc Receiver (FREESTYLE LIBRE 2 READER) DEVI For continuous blood glucose monitoring 06/14/22   [name], MD  Continuous Blood Gluc Sensor (FREESTYLE LIBRE 2 SENSOR) MISC APPLY 1 SENSOR ON THE SKIN EVERY 14 DAYS, USE AS DIRECTED TO CHECK BLOOD SUGAR 10/01/22   [name], MD  ELIQUIS  5 MG TABS tablet TAKE 1 TABLET BY MOUTH TWICE DAILY 10/01/22   [name], MD  empagliflozin (JARDIANCE) 25 MG TABS tablet Take 1 tablet (25 mg total) by mouth daily before breakfast. 08/24/22   [name], MD  furosemide (LASIX) 40 MG tablet TAKE 1 TABLET BY MOUTH TWICE DAILY Patient taking differently: Take 40-80 mg by mouth See admin instructions. Take 1 tablet (40 mg) by mouth in the morning & take 2 tablets (80 mg) by mouth in the evening. 07/27/22   [name], MD  glipiZIDE (GLUCOTROL) 10 MG tablet TAKE 1 TABLET BY MOUTH EVERY DAY BEFORE breakfast 08/13/22   [name], MD  insulin glargine (LANTUS) 100 UNIT/ML injection Inject 0.25 mLs (25 Units total) into the skin daily. PLEASE INCREASE INSULIN BY 1 UNIT DAILY UNTIL FBG IS <130, THEN HOLD AT THAT DOSE Patient taking differently: Inject 15 Units into the skin daily. 08/23/22   [name], MD  ipratropium-albuterol (DUONEB) 0.5-2.5 (3) MG/3ML SOLN Take 3 mLs by nebulization 3 (three) times daily. 03/01/22   Murlean Iba, MD  Lancets Misc. (ACCU-CHEK FASTCLIX LANCET) KIT Check BS QAM - DX: E11.9 02/12/19   [name], MD  metFORMIN (GLUCOPHAGE) 1000 MG tablet TAKE 1 TABLET BY MOUTH TWICE DAILY WITH a meal 08/13/22   [name], MD  Multiple Vitamin (MULTIVITAMIN) capsule Take 1 capsule by mouth in the morning. Mens one a day  [provider]  Multiple Vitamins-Minerals (ZINC PO) Take 30 mg by mouth at bedtime.    [provider]  [facility] ULTRA test strip USE TO CHECK BLOOD SUGAR EVERY MORNING 07/27/22   [name], MD  potassium chloride SA (KLOR-CON M20) 20 MEQ tablet Take 1 tablet (20 mEq total) by mouth daily. 07/30/22 07/25/23  Imogene Burn, PA-C  Semaglutide,0.25 or 0.'5MG'$ /DOS, (OZEMPIC, 0.25 OR 0.5 MG/DOSE,) 2 MG/3ML SOPN Inject 0.5 mg into the skin once a week. Patient taking differently: Inject 0.5 mg into the skin every Thursday. 08/24/22   [name],  MD      Allergies    Patient has no known allergies.    Review of Systems   Review of Systems  Physical Exam Updated Vital Signs BP (!) 152/79   Pulse 85   Resp (!) 34   Ht '6\' 1"'$  (1.854 m)   Wt 133.4 kg   SpO2 97%   BMI 38.79 kg/m  Physical Exam Vitals and nursing note reviewed.  Constitutional:      General: He is not in acute distress.    Appearance: He is well-developed.  HENT:     Head: Normocephalic and atraumatic.     Mouth/Throat:     Mouth: Mucous membranes are moist.  Eyes:     General: Vision grossly intact. Gaze aligned appropriately.     Extraocular Movements: Extraocular movements intact.     Conjunctiva/sclera: Conjunctivae normal.  Cardiovascular:     Rate and Rhythm: Normal rate and regular rhythm.     Pulses: Normal pulses.     Heart sounds: Normal heart sounds, S1 normal and S2 normal. No murmur heard.    No friction rub. No gallop.  Pulmonary:     Effort: Accessory muscle usage present.     Breath sounds: Decreased air movement present. Decreased breath sounds, wheezing and rales present.  Abdominal:     Palpations: Abdomen is soft.     Tenderness: There is no abdominal tenderness. There is no guarding or rebound.     Hernia: No hernia is present.  Musculoskeletal:        General: No swelling.     Cervical back: Full passive range of motion without pain, normal range of motion and neck supple. No pain with movement, spinous process tenderness or muscular tenderness. Normal range of motion.     Right lower leg: 2+ Pitting Edema present.     Left lower leg: 2+ Pitting Edema present.  Skin:    General: Skin is warm and dry.     Capillary Refill: Capillary refill takes less than 2 seconds.     Findings: No ecchymosis, erythema, lesion or wound.  Neurological:     Mental Status: He is alert and oriented to person, place, and time.     GCS: GCS eye subscore is 4. GCS verbal subscore is 5. GCS motor subscore is 6.     Cranial Nerves: Cranial nerves  2-12 are intact.     Sensory: Sensation is intact.     Motor: Motor function is intact. No weakness or abnormal muscle tone.     Coordination: Coordination is intact.  Psychiatric:        Mood and Affect: Mood normal.        Speech: Speech normal.        Behavior: Behavior normal.     ED Results / Procedures / Treatments   Labs (all labs ordered are listed, but only abnormal results  are displayed) Labs Reviewed  CBC WITH DIFFERENTIAL/PLATELET - Abnormal; Notable for the following components:      Result Value   WBC 13.3 (*)    MCV 101.9 (*)    Neutro Abs 11.1 (*)    Monocytes Absolute 1.1 (*)    Abs Immature Granulocytes 0.09 (*)    All other components within normal limits  COMPREHENSIVE METABOLIC PANEL - Abnormal; Notable for the following components:   Potassium 3.4 (*)    Chloride 88 (*)    CO2 35 (*)    Glucose, Bld 197 (*)    Albumin 3.4 (*)    All other components within normal limits  BRAIN NATRIURETIC PEPTIDE - Abnormal; Notable for the following components:   B Natriuretic Peptide 209.0 (*)    All other components within normal limits  TROPONIN I (HIGH SENSITIVITY) - Abnormal; Notable for the following components:   Troponin I (High Sensitivity) 19 (*)    All other components within normal limits  RESP PANEL BY RT-PCR (RSV, FLU A&B, COVID)  RVPGX2  TROPONIN I (HIGH SENSITIVITY)    EKG EKG Interpretation  Date/Time:  Friday October 26 2022 01:37:24 EST Ventricular Rate:  74 PR Interval:  62 QRS Duration: 120 QT Interval:  400 QTC Calculation: 444 R Axis:   82 Text Interpretation: Atrial fibrillation Short PR interval IVCD, consider atypical RBBB Probable anterior infarct, age indeterminate Lateral leads are also involved No significant change since last tracing Confirmed by Orpah Greek [phone]) on 10/26/2022 2:08:14 AM  Radiology DG Chest Port 1 View  Result Date: 10/26/2022 CLINICAL DATA:  Continued shortness of breath EXAM: PORTABLE CHEST 1  VIEW COMPARISON:  10/22/2022 FINDINGS: Stable cardiomegaly. Aortic atherosclerotic calcification. Left chest wall pacemaker similar diffuse interstitial opacities suggestive of edema. No pleural effusion or pneumothorax. IMPRESSION: Cardiomegaly and interstitial edema similar to prior. Electronically Signed   By: Placido Sou M.D.   On: 10/26/2022 02:15    Procedures Procedures    Medications Ordered in ED Medications  albuterol (PROVENTIL,VENTOLIN) solution continuous neb (10 mg/hr Nebulization New Bag/Given 10/26/22 0141)  furosemide (LASIX) injection 80 mg (has no administration in time range)  ipratropium (ATROVENT) 0.02 % nebulizer solution (0.5 mg  Given 10/26/22 0141)    ED Course/ Medical Decision Making/ A&P                             Medical Decision Making Amount and/or Complexity of Data Reviewed Independent Historian: EMS External Data Reviewed: labs, radiology, ECG and notes. Labs: ordered. Decision-making details documented in ED Course. Radiology: ordered and independent interpretation performed. Decision-making details documented in ED Course. ECG/medicine tests: ordered and independent interpretation performed. Decision-making details documented in ED Course.  Risk Prescription drug management.   Presents to the emergency department for evaluation of shortness of breath.  Patient with history of congestive heart failure, chronic A-fib, long smoking history (question COPD) resents for shortness of breath.  Records indicate that he was discharged earlier today from [facility] where he was treated for heart failure.  Patient reports persistent shortness of breath through the stay and worsening after getting home.  Does appear volume overloaded.  He is in mild distress, requiring increased supplemental oxygen to maintain oxygen saturations.  He is normally on 4 L at home, is currently requiring 7 by nasal cannula.  Chest x-ray consistent with volume overload.  No  clear evidence of pneumonia.  COVID, influenza negative.  BNP  mildly elevated, renal function normal.  Patient will initiate diuresis in the ED and will be admitted for further management.        Final Clinical Impression(s) / ED Diagnoses Final diagnoses:  Acute on chronic congestive heart failure, unspecified heart failure type [facility])    Rx / DC Orders ED Discharge Orders     None         Orpah Greek, MD 10/26/22 [phone]

## 2022-10-26 NOTE — Progress Notes (Signed)
Patient seen and examined; admitted after midnight secondary to acute on chronic respiratory failure with hypoxia and hypercapnia; workup demonstrating most likely a combination of COPD and CHF exacerbation is triggering point.  At time of examination hypersomnolent with results of venous blood gas with significant component of hypercapnia.  Patient with underlying history of obesity and sleep apnea as well.  Please refer to H&P written by Dr.Zierle-Ghosh on 10/26/22 for further info/details on admission.  Plan: -Continue daily weight, strict I's and O's and IV diuresis. -Steroids, bronchodilator management using Pulmicort/Brovana along with scheduled DuoNeb will be provided as part of COPD exacerbation treatment. -Given component of hypercapnia BiPAP will be initiated. -Continue close monitoring to patient's respiratory status and response to medical management.  Clifton James MaderaMD [phone]

## 2022-10-26 NOTE — Assessment & Plan Note (Addendum)
-   Reports breathing back to his baseline; no orthopnea. -Patient also demonstrating Peripheral edema and chest x-ray at time of admission with vascular congestion and interstitial edema. -Lower extremity swelling resolved and no crackles appreciated on physical exam. -Patient is speaking in full sentences and reporting good saturation on chronic supplementation of 4 L. -Continue low-sodium diet, daily weights and adjusted diuretic dosage at time of discharge.

## 2022-10-26 NOTE — ED Notes (Signed)
RT at bedside.

## 2022-10-26 NOTE — TOC Initial Note (Addendum)
Transition of Care [facility]) - Initial/Assessment Note    Patient Details  Name: Steven Henry MRN: [id number] Date of Birth: 02/04/1951  [facility] of [facility]) CM/SW Contact:    Iona Beard, [place] Phone Number: 10/26/2022, 1:51 PM  Clinical Narrative:                 [facility] consulted for CHF screen. CSW spoke with pt and daughter in room to complete assessment. Pt lives with his daughter. Pt is independent in completing his ADLs. Pt is able to drive to doctors appointments. Pt had [place] set up with [facility] at D/C from [facility]. Pts daughter states they would like outpatient therapy in [place] at D/C rather than HH at this time. Pt does not use any DME. Pt does have home O2 supplied through [employer]. Home neb machine was ordered 06/23 through [facility].  Pt weighs himself weekly, CSW explained the importance of daily weights. Pts daughter ensures pt takes all medications as prescribed. Pt follows a low sodium diet. TOC to follow.  Expected Discharge Plan: OP Rehab Barriers to Discharge: Continued Medical Work up   Patient Goals and CMS Choice Patient states their goals for this hospitalization and ongoing recovery are:: return home CMS Medicare.gov Compare Post Acute Care list provided to:: Patient Choice offered to / list presented to : Adult Children, Patient      Expected Discharge Plan and Services In-house Referral: Clinical Social Work Discharge Planning Services: CM Consult   Living arrangements for the past 2 months: Single Family Home                                      Prior Living Arrangements/Services Living arrangements for the past 2 months: Single Family Home Lives with:: Adult Children Patient language and need for interpreter reviewed:: Yes Do you feel safe going back to the place where you live?: Yes      Need for Family Participation in Patient Care: Yes (Comment) Care giver support system in place?: Yes (comment)   Criminal  Activity/Legal Involvement Pertinent to Current Situation/Hospitalization: No - Comment as needed  Activities of Daily Living Home Assistive Devices/Equipment: None ADL Screening (condition at time of admission) Patient's cognitive ability adequate to safely complete daily activities?: Yes Is the patient deaf or have difficulty hearing?: No Does the patient have difficulty seeing, even when wearing glasses/contacts?: No Does the patient have difficulty concentrating, remembering, or making decisions?: No Patient able to express need for assistance with ADLs?: Yes Does the patient have difficulty dressing or bathing?: No Independently performs ADLs?: Yes (appropriate for developmental age) Communication: Independent Dressing (OT): Independent Grooming: Independent Feeding: Independent Bathing: Independent Toileting: Independent In/Out Bed: Independent Walks in Home: Independent Does the patient have difficulty walking or climbing stairs?: No Weakness of Legs: None Weakness of Arms/Hands: None  Permission Sought/Granted                  Emotional Assessment Appearance:: Appears stated age       Alcohol / Substance Use: Not Applicable Psych Involvement: No (comment)  Admission diagnosis:  Acute on chronic respiratory failure with hypoxia ([place]) [J96.21] Acute on chronic congestive heart failure, unspecified heart failure type [facility]) [I50.9] Patient Active Problem List   Diagnosis Date Noted   Acute on chronic respiratory failure with hypoxia ([place]) 10/26/2022   Hypokalemia 10/26/2022   Elevated troponin 07/68/0881   Acute metabolic  encephalopathy 10/26/2022   Leukocytosis 10/26/2022   Hypercoagulable state due to persistent atrial fibrillation ([place]) 10/12/2022   Type 2 diabetes mellitus with hyperglycemia ([place]) 02/26/2022   Mixed hyperlipidemia 02/26/2022   Thrombocytopenia ([place]) 02/26/2022   Acute on chronic respiratory failure with hypoxia and hypercapnia ([facility])  02/26/2022   Atrial fibrillation ([place]) 02/26/2022   Acute on chronic diastolic CHF (congestive heart failure) ([place]) 02/25/2022   Obstructive sleep apnea 08/28/2020   Neuropathy 08/28/2020   Tobacco use 08/28/2020   Varicose veins of right lower extremity with complications 58/59/2924   History of colonic polyps    Diverticulosis of colon without hemorrhage    CKD (chronic kidney disease), stage III ([place])    COPD with acute exacerbation ([place]) 03/05/2013   Essential hypertension 03/05/2013   Smoker 03/05/2013   DMII (diabetes mellitus, type 2) ([place])    Morbid obesity ([place]) 01/08/2013   PCP:  [name], MD Pharmacy:   [facility], [place] [address] [place] [address] Phone: [phone] Fax: [phone]     Social Determinants of Health (SDOH) Social History: SDOH Screenings   Food Insecurity: No Food Insecurity (10/26/2022)  Housing: Low Risk  (10/26/2022)  Transportation Needs: No Transportation Needs (10/26/2022)  Utilities: Not At Risk (10/26/2022)  Alcohol Screen: Low Risk  (09/06/2022)  Depression (PHQ2-9): Low Risk  (09/28/2022)  Financial Resource Strain: Low Risk  (09/06/2022)  Physical Activity: Sufficiently Active (09/06/2022)  Social Connections: Socially Isolated (09/06/2022)  Stress: No Stress Concern Present (09/06/2022)  Tobacco Use: High Risk (10/26/2022)   SDOH Interventions: Housing Interventions: Intervention Not Indicated   Readmission Risk Interventions     No data to display

## 2022-10-26 NOTE — Assessment & Plan Note (Signed)
-  Continue bisoprolol -Blood pressure overall stable and well-controlled -Continue to follow vital sign -Heart healthy diet discussed with patient.

## 2022-10-26 NOTE — ED Notes (Signed)
Pt assisted using urinal at bedside.

## 2022-10-26 NOTE — Assessment & Plan Note (Signed)
-   Could be related to steroid use - Will check procalcitonin - No focal pneumonia on chest x-ray - No other overt signs of infection, but patient is not able to provide history at this time regarding infectious symptoms

## 2022-10-26 NOTE — Progress Notes (Signed)
AT 2449PN RT placed patient on BIPAP 20/9  and 40% FIO2. Patient BIPAP volumes range between 350-408. Patient appears to be tolerating well, RT will continue to assess and monitor.

## 2022-10-26 NOTE — Plan of Care (Signed)

## 2022-10-26 NOTE — Assessment & Plan Note (Addendum)
-   Resume home hypoglycemic regimen -Patient advised to follow modified carbohydrate diet and maintain adequate hydration.

## 2022-10-26 NOTE — Assessment & Plan Note (Signed)
-   4 L nasal cannula at baseline - Requiring 7 L nasal cannula here - Chest x-ray is indicative of CHF - Possibly component of COPD contributing as well - See respective assessments and plans -Wean down O2 as tolerated

## 2022-10-26 NOTE — Assessment & Plan Note (Signed)
-   Repleted and is stable at discharge -Continue to follow trend.

## 2022-10-26 NOTE — Progress Notes (Signed)
  Echocardiogram 2D Echocardiogram has been performed.  Steven Henry 10/26/2022, 10:13 AM

## 2022-10-26 NOTE — H&P (Signed)
History and Physical    Patient: Steven Henry XBM:[id number] DOB: 09/05/1951 DOA: 10/26/2022 DOS: the patient was seen and examined on 10/26/2022 PCP: [name], MD  Patient coming from: Home  Chief Complaint:  Chief Complaint  Patient presents with   Shortness of Breath   HPI: Steven Henry is a 72 y.o. male with medical history significant of atrial fibrillation, CHF, COPD, diabetes mellitus type 2, hypertension, sleep apnea, and more presents the ED with a chief complaint of dyspnea.  Unfortunately, at the time of my exam patient is not able to provide any history.  He just cannot stay awake long enough to answer the questions.  He does say his full name, and several times after that when woken up to ask other questions he just repeats his full name.  He does eventually state that he had difficulty breathing and that it started yesterday.  He was just discharged from [facility] yesterday.  They apparently treated him for a CHF exacerbation.  Patient is somnolent during my exam, so adding on a VBG to check for CO2 retention.  Patient was not given any sedating medications prior to my exam.   Review of Systems: unable to review all systems due to the inability of the patient to answer questions. Past Medical History:  Diagnosis Date   A-fib ([place])    CHF (congestive heart failure) (HCC)    CKD (chronic kidney disease), stage III (HCC)    Colon polyps    Diabetes mellitus type II, uncontrolled    Diabetes mellitus without complication ([place])    Hypertension    Morbid obesity ([place])    PVD (peripheral vascular disease) ([place])    right femoral artery   Sleep apnea    not on CPAP    Sleep apnea    Past Surgical History:  Procedure Laterality Date   COLONOSCOPY N/A 09/16/2015   Procedure: COLONOSCOPY;  Surgeon: Daneil Dolin, MD;  Location: [facility];  Service: Endoscopy;  Laterality: N/A;  8:15 AM - pt has c pap   ENDOVENOUS ABLATION SAPHENOUS VEIN W/ LASER  Right 08/26/2017   endovenous laser ablation right greater saphenous vein by Tinnie Gens MD    PACEMAKER IMPLANT N/A 09/14/2022   Procedure: PACEMAKER IMPLANT;  Surgeon: Deboraha Sprang, MD;  Location: [place] [facility];  Service: Cardiovascular;  Laterality: N/A;   stab phlebectomy  Right 01/07/2018   stab phlebectomy 10-20 incisions right leg by Tinnie Gens MD    Social History:  reports that he has been smoking cigarettes. He started smoking about 64 years ago. He has a 45.00 pack-year smoking history. He has never used smokeless tobacco. He reports current alcohol use of about 1.0 standard drink of alcohol per week. He reports that he does not use drugs.  No Known Allergies  Family History  Problem Relation Age of Onset   Colon cancer Mother    Diabetes Mother    Heart disease Mother    Diabetes Sister    Diabetes Sister    Diabetes Sister     Prior to Admission medications   Medication Sig Start Date End Date Taking? Authorizing Provider  Accu-Chek FastClix Lancets MISC Check BS QAM - DX: E11.9 02/12/19   [name], MD  atorvastatin (LIPITOR) 40 MG tablet TAKE 1 TABLET BY MOUTH DAILY Patient taking differently: Take 40 mg by mouth every evening. 08/13/22   [name], MD  bisoprolol (ZEBETA) 5 MG tablet Take 1  tablet (5 mg total) by mouth daily. Patient taking differently: Take 5 mg by mouth every evening. 09/14/22   Ledora Bottcher, PA  Blood Glucose Monitoring Suppl (ONE TOUCH ULTRA 2) w/Device KIT USETO CHECK BLOOD SUGAR EVERY MORNING 05/26/21   [name], MD  cephALEXin (KEFLEX) 500 MG capsule Take 1 capsule (500 mg total) by mouth 3 (three) times daily. Patient not taking: Reported on 10/25/2022 09/28/22   [name], MD  Cholecalciferol (VITAMIN D3) 10 MCG (400 UNIT) tablet Take 800 Units by mouth daily. 09/02/20   [provider]  Continuous Blood Gluc Receiver (FREESTYLE LIBRE 2 READER) DEVI For continuous blood glucose monitoring  06/14/22   [name], MD  Continuous Blood Gluc Sensor (FREESTYLE LIBRE 2 SENSOR) MISC APPLY 1 SENSOR ON THE SKIN EVERY 14 DAYS, USE AS DIRECTED TO CHECK BLOOD SUGAR 10/01/22   [name], MD  ELIQUIS 5 MG TABS tablet TAKE 1 TABLET BY MOUTH TWICE DAILY 10/01/22   [name], MD  empagliflozin (JARDIANCE) 25 MG TABS tablet Take 1 tablet (25 mg total) by mouth daily before breakfast. 08/24/22   [name], MD  furosemide (LASIX) 40 MG tablet TAKE 1 TABLET BY MOUTH TWICE DAILY Patient taking differently: Take 40-80 mg by mouth See admin instructions. Take 1 tablet (40 mg) by mouth in the morning & take 2 tablets (80 mg) by mouth in the evening. 07/27/22   [name], MD  glipiZIDE (GLUCOTROL) 10 MG tablet TAKE 1 TABLET BY MOUTH EVERY DAY BEFORE breakfast 08/13/22   [name], MD  insulin glargine (LANTUS) 100 UNIT/ML injection Inject 0.25 mLs (25 Units total) into the skin daily. PLEASE INCREASE INSULIN BY 1 UNIT DAILY UNTIL FBG IS <130, THEN HOLD AT THAT DOSE Patient taking differently: Inject 15 Units into the skin daily. 08/23/22   [name], MD  ipratropium-albuterol (DUONEB) 0.5-2.5 (3) MG/3ML SOLN Take 3 mLs by nebulization 3 (three) times daily. 03/01/22   Murlean Iba, MD  Lancets Misc. (ACCU-CHEK FASTCLIX LANCET) KIT Check BS QAM - DX: E11.9 02/12/19   [name], MD  metFORMIN (GLUCOPHAGE) 1000 MG tablet TAKE 1 TABLET BY MOUTH TWICE DAILY WITH a meal 08/13/22   [name], MD  Multiple Vitamin (MULTIVITAMIN) capsule Take 1 capsule by mouth in the morning. Mens one a day    [provider]  Multiple Vitamins-Minerals (ZINC PO) Take 30 mg by mouth at bedtime.    [provider]  [facility] ULTRA test strip USE TO CHECK BLOOD SUGAR EVERY MORNING 07/27/22   [name], MD  potassium chloride SA (KLOR-CON M20) 20 MEQ tablet Take 1 tablet (20 mEq total) by mouth daily. 07/30/22 07/25/23  Imogene Burn, PA-C   Semaglutide,0.25 or 0.'5MG'$ /DOS, (OZEMPIC, 0.25 OR 0.5 MG/DOSE,) 2 MG/3ML SOPN Inject 0.5 mg into the skin once a week. Patient taking differently: Inject 0.5 mg into the skin every Thursday. 08/24/22   [name], MD    Physical Exam: Vitals:   10/26/22 0330 10/26/22 0400 10/26/22 0500 10/26/22 0600  BP:  (!) 158/89 (!) 149/83 (!) 142/70  Pulse:  84 89 77  Resp:  (!) 28 (!) 35 (!) 25  Temp: 99.8 F (37.7 C)  99.4 F (37.4 C)   TempSrc: Axillary  Oral   SpO2:  95% 93% 94%  Weight:   130 kg   Height:   '6\' 1"'$  (1.854 m)    1.  General: Patient lying supine in bed,  no acute distress   2. Psychiatric: Somnolent, oriented to self, mood and behavior normal for situation, pleasant and cooperative with exam   3. Neurologic: Speech and language are normal, face is symmetric, moves all 4 extremities voluntarily, at baseline without acute deficits on limited exam   4. HEENMT:  Head is atraumatic, normocephalic, pupils reactive to light, neck is supple, trachea is midline, mucous membranes are moist   5. Respiratory : With wheezing and rales on auscultation, no cyanosis, maintaining oxygen sats on high flow nasal cannula   6. Cardiovascular : Heart rate normal, rhythm is regular, no murmurs, rubs or gallops, peripheral edema present, peripheral pulses palpated   7. Gastrointestinal:  Abdomen is soft, nondistended, nontender to palpation bowel sounds active, no masses or organomegaly palpated   8. Skin:  Skin is warm, dry and intact without rashes, acute lesions, or ulcers on limited exam   9.Musculoskeletal:  No acute deformities or trauma, no asymmetry in tone, peripheral edema present, peripheral pulses palpated, no tenderness to palpation in the extremities  Data Reviewed: In the ED  Afebrile, heart rate 69-88, respiratory rate 19-34, blood pressure 152/79-158/91, satting as low as 72% Leukocytosis at 13.3, hemoglobin 15.2 Hypokalemic at 3.4 Hyperglycemic at 197 BNP is  only 209 Troponin is flat at 19 and 22 Chest x-ray shows cardiomegaly with interstitial edema Negative COVID, flu, RSV A-fib on EKG with a heart rate of 74 80 mg Lasix given in the ED Admission requested for acute respiratory failure with hypoxia Assessment and Plan: * Acute on chronic respiratory failure with hypoxia (HCC) - 4 L nasal cannula at baseline - Requiring 7 L nasal cannula here - Chest x-ray is indicative of CHF - Possibly component of COPD contributing as well - See respective assessments and plans -Wean down O2 as tolerated  Leukocytosis - Could be related to steroid use - Will check procalcitonin - No focal pneumonia on chest x-ray - No other overt signs of infection, but patient is not able to provide history at this time regarding infectious symptoms  Acute metabolic encephalopathy - Patient is quite somnolent on exam - Not oriented about his situation or why he is here - With history of COPD, will be checking VBG for CO2 retention this a.m.  Elevated troponin - Initial troponin 19 - Repeat pending - Likely secondary to CHF exacerbation - Monitor on telemetry  Hypokalemia - Potassium 3.4 - Likely to shift his glucose is corrected - Replace with 20 mEq potassium - Trend in the a.m.  Atrial fibrillation (HCC) - Continue beta-blocker and Eliquis - A-fib on EKG with old rate  Mixed hyperlipidemia - Continue statin  Acute on chronic diastolic CHF (congestive heart failure) (HCC) - Dyspnea on exertion - Peripheral edema - Chest x-ray with cardiomegaly and vascular congestion - Pulmonary 20 mg of Lasix at home divided between 2 doses - 80 mg IV Lasix given in the ED - Continue 60 mg IV Lasix twice daily last echo showed an ejection fraction of 65-70% in June 2023 - Update echo of the diastolic parameters were indeterminate - Associated troponin leak at 19 - Fluid restrictions - Monitor intake and output - Daily weights - The stepdown in case patient  should need BiPAP - Continue to monitor  Essential hypertension - Continue Zebeta  DMII (diabetes mellitus, type 2) (HCC) - 15 units of basal insulin at baseline - 7 units basal insulin here - Sliding scale coverage - Continue to  monitor      Advance Care Planning:   Code Status: Full Code-by default as patient is not able to provide history at this time  Consults: None at this time  Family Communication: Family member at bedside who also will not wake up, likely related to being up all night  Severity of Illness: The appropriate patient status for this patient is INPATIENT. Inpatient status is judged to be reasonable and necessary in order to provide the required intensity of service to ensure the patient's safety. The patient's presenting symptoms, physical exam findings, and initial radiographic and laboratory data in the context of their chronic comorbidities is felt to place them at high risk for further clinical deterioration. Furthermore, it is not anticipated that the patient will be medically stable for discharge from the hospital within 2 midnights of admission.   * I certify that at the point of admission it is my clinical judgment that the patient will require inpatient hospital care spanning beyond 2 midnights from the point of admission due to high intensity of service, high risk for further deterioration and high frequency of surveillance required.*  Author: Rolla Plate, DO 10/26/2022 6:17 AM  For on call review [email or web address].

## 2022-10-26 NOTE — Assessment & Plan Note (Addendum)
Continue statin 

## 2022-10-26 NOTE — ED Notes (Signed)
CAT neb tx finished and taken off of pt.

## 2022-10-26 NOTE — ED Triage Notes (Signed)
Pt brought in by daughter for continued shortness of breath after being discharged from [facility] at about 8pm last night. Pt arrives on RA but states he wears 4L [employer] chronically at home.   Pt states his breathing has continued to get worse since being discharged.

## 2022-10-26 NOTE — Assessment & Plan Note (Signed)
-   Present at time of admission-most likely secondary to hypercapnia. -After usage of BiPAP patient's mentation has improved and back to baseline -Continue close monitoring -Minimize the use of sedative agents.

## 2022-10-26 NOTE — Progress Notes (Signed)
Upon arrival to room for resp distress patient's O2 sat 83% on 6 lpm. Diminished breath sounds with expiratory wheezes throughout. Started patient on '10mg'$ /hr Alb & 0.'5mg'$  Atrovent CAT and placed him on 7 lpm [place] HFNC. Patient tolerating well at this time and O2 sat is in upper 90s.

## 2022-10-26 NOTE — Assessment & Plan Note (Addendum)
-  Continue beta-blocker and Eliquis - Rate controlled. -Continue telemetry monitoring; follow electrolytes and maintain adequate levels (goal is for potassium of 4 and magnesium around 2).

## 2022-10-27 ENCOUNTER — Other Ambulatory Visit: Payer: Self-pay | Admitting: Family Medicine

## 2022-10-27 DIAGNOSIS — N183 Chronic kidney disease, stage 3 unspecified: Secondary | ICD-10-CM

## 2022-10-27 DIAGNOSIS — I4891 Unspecified atrial fibrillation: Secondary | ICD-10-CM

## 2022-10-27 DIAGNOSIS — E1100 Type 2 diabetes mellitus with hyperosmolarity without nonketotic hyperglycemic-hyperosmolar coma (NKHHC): Secondary | ICD-10-CM

## 2022-10-27 LAB — GLUCOSE, CAPILLARY
Glucose-Capillary: 169 mg/dL — ABNORMAL HIGH (ref 70–99)
Glucose-Capillary: 208 mg/dL — ABNORMAL HIGH (ref 70–99)
Glucose-Capillary: 215 mg/dL — ABNORMAL HIGH (ref 70–99)
Glucose-Capillary: 219 mg/dL — ABNORMAL HIGH (ref 70–99)
Glucose-Capillary: 277 mg/dL — ABNORMAL HIGH (ref 70–99)

## 2022-10-27 MED ORDER — PANTOPRAZOLE SODIUM 40 MG PO TBEC
40.0000 mg | DELAYED_RELEASE_TABLET | Freq: Every day | ORAL | Status: DC
  Administered 2022-10-27 – 2022-10-29 (×3): 40 mg via ORAL
  Filled 2022-10-27 (×3): qty 1

## 2022-10-27 MED ORDER — CEFDINIR 300 MG PO CAPS
300.0000 mg | ORAL_CAPSULE | Freq: Two times a day (BID) | ORAL | Status: DC
  Administered 2022-10-27 – 2022-10-29 (×4): 300 mg via ORAL
  Filled 2022-10-27 (×4): qty 1

## 2022-10-27 MED ORDER — DOCUSATE SODIUM 100 MG PO CAPS
100.0000 mg | ORAL_CAPSULE | Freq: Two times a day (BID) | ORAL | Status: DC
  Administered 2022-10-27 – 2022-10-29 (×5): 100 mg via ORAL
  Filled 2022-10-27 (×5): qty 1

## 2022-10-27 MED ORDER — POLYETHYLENE GLYCOL 3350 17 G PO PACK
17.0000 g | PACK | Freq: Every day | ORAL | Status: DC
  Administered 2022-10-27 – 2022-10-29 (×3): 17 g via ORAL
  Filled 2022-10-27 (×3): qty 1

## 2022-10-27 NOTE — Plan of Care (Signed)
  Problem: Education: Goal: Knowledge of General Education information will improve Description: Including pain rating scale, medication(s)/side effects and non-pharmacologic comfort measures Outcome: Progressing   Problem: Clinical Measurements: Goal: Respiratory complications will improve Outcome: Progressing   Problem: Clinical Measurements: Goal: Cardiovascular complication will be avoided Outcome: Progressing   Problem: Nutrition: Goal: Adequate nutrition will be maintained Outcome: Progressing   Problem: Coping: Goal: Level of anxiety will decrease Outcome: Progressing   Problem: Pain Managment: Goal: General experience of comfort will improve Outcome: Progressing   Problem: Skin Integrity: Goal: Risk for impaired skin integrity will decrease Outcome: Progressing   Problem: Education: Goal: Ability to demonstrate management of disease process will improve Outcome: Progressing

## 2022-10-27 NOTE — Progress Notes (Signed)
Patient removed from [place] when nebulizers were finished running. Placed on [place] 5Lpm

## 2022-10-27 NOTE — Progress Notes (Signed)
Patient received in bed awake and alert, ate 75% of breakfast meal, 0/10 pain, resting in bed quietly watching TV.

## 2022-10-27 NOTE — Progress Notes (Signed)
Progress Note   Patient: Steven Henry NFA:[id number] DOB: Jan 24, 1951 DOA: 10/26/2022     1 DOS: the patient was seen and examined on 10/27/2022   Brief hospital course: As per H&P written by Dr.Zierle-Ghosh on 10/26/22 Steven Henry is a 72 y.o. male with medical history significant of atrial fibrillation, CHF, COPD, diabetes mellitus type 2, hypertension, sleep apnea, and more presents the ED with a chief complaint of dyspnea.  Unfortunately, at the time of my exam patient is not able to provide any history.  He just cannot stay awake long enough to answer the questions.  He does say his full name, and several times after that when woken up to ask other questions he just repeats his full name.  He does eventually state that he had difficulty breathing and that it started yesterday.  He was just discharged from [facility] yesterday.  They apparently treated him for a CHF exacerbation.  Patient is somnolent during my exam, so adding on a VBG to check for CO2 retention.  Patient was not given any sedating medications prior to my exam.    Assessment and Plan: * Acute on chronic respiratory failure with hypoxia (HCC) - 4 L nasal cannula at baseline - Requiring 7 L nasal cannula here - Chest x-ray is indicative of CHF - Possibly component of COPD contributing as well - See respective assessments and plans -Wean down O2 as tolerated  Leukocytosis - Could be related to steroid use - Will check procalcitonin - No focal pneumonia on chest x-ray - No other overt signs of infection, but patient is not able to provide history at this time regarding infectious symptoms  Acute metabolic encephalopathy - Patient is quite somnolent on exam - Not oriented about his situation or why he is here - With history of COPD, will be checking VBG for CO2 retention this a.m.  Elevated troponin - Initial troponin 19 - Repeat pending - Likely secondary to CHF exacerbation - Monitor on  telemetry  Hypokalemia - Potassium 3.4 - Likely to shift his glucose is corrected - Replace with 20 mEq potassium - Trend in the a.m.  Atrial fibrillation (HCC) - Continue beta-blocker and Eliquis - A-fib on EKG with old rate  Mixed hyperlipidemia - Continue statin  Acute on chronic diastolic CHF (congestive heart failure) (HCC) - Dyspnea on exertion - Peripheral edema - Chest x-ray with cardiomegaly and vascular congestion - Pulmonary 20 mg of Lasix at home divided between 2 doses - 80 mg IV Lasix given in the ED - Continue 60 mg IV Lasix twice daily last echo showed an ejection fraction of 65-70% in June 2023 - Update echo of the diastolic parameters were indeterminate - Associated troponin leak at 19 - Fluid restrictions - Monitor intake and output - Daily weights - The stepdown in case patient should need BiPAP - Continue to monitor  Essential hypertension - Continue Zebeta  DMII (diabetes mellitus, type 2) (HCC) - 15 units of basal insulin at baseline - 7 units basal insulin here - Sliding scale coverage - Continue to monitor  Obesity class II -Body mass index is 37 kg/m. -Low-calorie diet, portion control and increase physical activity discussed with patient.  GERD/Constipation -will use PPI and add PRN miralax and colace. -Patient advised to maintain adequate oral hydration.  Subjective:  No chest pain, no nausea, no vomiting.  Still short winded with minimal activity and demonstrating difficulty speaking in full sentences.  5 L nasal cannula supplementation in place.  Positive fine crackles at the bases bilaterally.  Physical Exam: Vitals:   10/27/22 0600 10/27/22 0800 10/27/22 1108 10/27/22 1705  BP: 114/72     Pulse: (!) 58  (!) 59   Resp: (!) 22  17   Temp:  98 F (36.7 C) 97.6 F (36.4 C) 98.1 F (36.7 C)  TempSrc:  Axillary Axillary Oral  SpO2: 95% 97% 95%   Weight:      Height:       General exam: Alert, awake, oriented x 3; following  commands appropriately and demonstrating short winded sensation with minimal activity.  Mild difficulty speaking in full sentences and requiring 5 L nasal cannula supplementation. Respiratory system: Positive rhonchi and mild expiratory wheezing appreciated on exam; fine crackles at the bases heard during auscultation.  No using accessory muscle. Cardiovascular system:RRR. No rubs or gallops; positive systolic murmur appreciated on exam.  Unable to assess JVD with body habitus. Gastrointestinal system: Abdomen is obese, nondistended, soft and nontender. No organomegaly or masses felt. Normal bowel sounds heard. Central nervous system: Alert and oriented. No focal neurological deficits. Extremities: No cyanosis or clubbing; 1+ edema appreciated bilaterally. Skin: No petechiae. Psychiatry: Judgement and insight appear normal. Mood & affect appropriate.   Data Reviewed: Procalcitonin 0.19 Comprehensive metabolic panel: Sodium 038, potassium 3.5, BUN 22, creatinine 0.86, chloride 95, bicarb 36. CBC: WBCs 13.7, hemoglobin 15.1 and platelet count 206K  Family Communication: Daughter updated at bedside on 10/26/2022  Disposition: Status is: Inpatient Remains inpatient appropriate because: Continue treatment with IV diuresis, IV steroids and bronchodilator management.  Patient oxygen supplementation is still not back to baseline still short winded with activity.   Planned Discharge Destination: Home  Time spent: 50 minutes  Author: Barton Dubois, MD 10/27/2022 5:28 PM  For on call review [email or web address].

## 2022-10-28 LAB — BASIC METABOLIC PANEL
Anion gap: 9 (ref 5–15)
BUN: 31 mg/dL — ABNORMAL HIGH (ref 8–23)
CO2: 41 mmol/L — ABNORMAL HIGH (ref 22–32)
Calcium: 8.7 mg/dL — ABNORMAL LOW (ref 8.9–10.3)
Chloride: 91 mmol/L — ABNORMAL LOW (ref 98–111)
Creatinine, Ser: 0.93 mg/dL (ref 0.61–1.24)
GFR, Estimated: >60 mL/min (ref >60)
Glucose, Bld: 205 mg/dL — ABNORMAL HIGH (ref 70–99)
Potassium: 3.6 mmol/L (ref 3.5–5.1)
Sodium: 141 mmol/L (ref 135–145)

## 2022-10-28 LAB — GLUCOSE, CAPILLARY
Glucose-Capillary: 184 mg/dL — ABNORMAL HIGH (ref 70–99)
Glucose-Capillary: 416 mg/dL — ABNORMAL HIGH (ref 70–99)
Glucose-Capillary: 240 mg/dL — ABNORMAL HIGH (ref 70–99)
Glucose-Capillary: 237 mg/dL — ABNORMAL HIGH (ref 70–99)

## 2022-10-28 LAB — GLUCOSE, RANDOM: Glucose, Bld: 320 mg/dL — ABNORMAL HIGH (ref 70–99)

## 2022-10-28 MED ORDER — INSULIN DETEMIR 100 UNIT/ML ~~LOC~~ SOLN
10.0000 [IU] | Freq: Two times a day (BID) | SUBCUTANEOUS | Status: DC
  Administered 2022-10-28 – 2022-10-29 (×2): 10 [IU] via SUBCUTANEOUS
  Filled 2022-10-28 (×4): qty 0.1

## 2022-10-28 MED ORDER — INSULIN ASPART 100 UNIT/ML IJ SOLN
20.0000 [IU] | Freq: Once | INTRAMUSCULAR | Status: AC
  Administered 2022-10-28: 20 [IU] via SUBCUTANEOUS

## 2022-10-28 NOTE — Progress Notes (Signed)
Daughter called and inquired as to discharge. Patient notes states patient uses O2 4 LNC baseline. Pt daughter states pt does NOT use O2 at home prior to recent hospitalization for CHF. If pt requires O2 to go home then home O2 will need to ordered and set up for pt to go home safely. Daughter states patient has a cardioversion scheduled for Tuesday, Oct 29, 2022 at [facility]. Daughter manages pt's health needs and will need updates on plan for discharge and if he will be able to go to cardioversion Tuesday or should it be rescheduled. Daughter works 16 hr shifts and is available to speak with MD between hours of 7a-8a and 2p-3pm on Monday 2/5. She will be off Tuesday.  Please contact Willaim Bane #[phone].

## 2022-10-28 NOTE — Progress Notes (Signed)
Progress Note   Patient: Steven Henry WCH:[id number] DOB: Dec 04, 1950 DOA: 10/26/2022     2 DOS: the patient was seen and examined on 10/28/2022   Brief hospital course: As per H&P written by Dr.Zierle-Ghosh on 10/26/22 Steven Henry is a 72 y.o. male with medical history significant of atrial fibrillation, CHF, COPD, diabetes mellitus type 2, hypertension, sleep apnea, and more presents the ED with a chief complaint of dyspnea.  Unfortunately, at the time of my exam patient is not able to provide any history.  He just cannot stay awake long enough to answer the questions.  He does say his full name, and several times after that when woken up to ask other questions he just repeats his full name.  He does eventually state that he had difficulty breathing and that it started yesterday.  He was just discharged from [facility] yesterday.  They apparently treated him for a CHF exacerbation.  Patient is somnolent during my exam, so adding on a VBG to check for CO2 retention.  Patient was not given any sedating medications prior to my exam.    Assessment and Plan: * Acute on chronic respiratory failure with hypoxia (HCC) - 4 L nasal cannula at baseline - Requiring 7 L high flow nasal cannula supplementation at time of admission; with subsequent component of hypercapnia requiring BiPAP. -Patient respiratory distress secondary to COPD and CHF exacerbation. - Continue IV diuresis, daily weights, strict intake and output and low-sodium diet. -Will also continue IV steroids, bronchodilator management, oral cefdinir, flutter valve and continue supportive care. -Continue the use of BiPAP nightly. -Patient supplementation back down to 4 L nasal cannula at baseline.  Leukocytosis - Appears to be related to a stress demargination -Procalcitonin 0.19 -Given potential underlying bronchiectasis with COPD exacerbation will cover with 5 days of cefdinir. -Continue to follow clinical response.  Acute  metabolic encephalopathy - Present at time of admission-most likely secondary to hypercapnia. -After usage of BiPAP patient's mentation has improved and back to baseline -Continue close monitoring -Minimize the use of sedative agents.  Elevated troponin - Flat elevation in the setting of demand ischemia secondary to COPD and CHF exacerbation. -Patient denies chest pain. -Telemetry and EKG without acute ischemic changes. -Continue current therapy and close monitoring.  Hypokalemia - Continue repletion and follow electrolytes trend.  Atrial fibrillation (HCC) -Continue beta-blocker and Eliquis - Rate controlled. -Continue telemetry monitoring; follow electrolytes and maintain adequate levels (goal is for potassium of 4 and magnesium around 2).  Mixed hyperlipidemia - Continue statin -Heart healthy diet discussed with patient.  Acute on chronic diastolic CHF (congestive heart failure) (HCC) - Dyspnea on exertion with exercise and short winded sensation with minimal activity and positive fine crackles on auscultation. -Patient also demonstrating Peripheral edema and chest x-ray at time of admission with vascular congestion and interstitial edema. -Continue IV diuresis as mentioned above. -Continue low-sodium diet, daily weights and strict intake and output.  Essential hypertension - Continue bisoprolol -Blood pressure overall stable and well-controlled -Continue to follow vital sign -Heart healthy diet discussed with patient.  DMII (diabetes mellitus, type 2) (HCC) - Continue sliding scale insulin and basal insulin at bedtime. -Follow CBGs fluctuation and adjust regimen as needed -Anticipating increase in his CBGs levels secondary to steroids usage    Obesity class II -Body mass index is 37.32 kg/m. -Low-calorie diet, portion control and increase physical activity discussed with patient.  GERD/Constipation -will use PPI and add PRN miralax and colace. -Patient advised to  maintain  adequate oral hydration.  Subjective:  No chest pain, no nausea, no vomiting.  Still reporting short winded sensation with activity; supplementation down to baseline 4 L nasal cannula; reporting good urine output.  Mild difficulty while laying down flat reported.  Almost able to speak in full sentences.  Physical Exam: Vitals:   10/27/22 2023 10/27/22 2327 10/28/22 0631 10/28/22 0804  BP: (!) 154/67  (!) 129/90   Pulse: 64 70 64   Resp: (!) '22 16 20   '$ Temp: 98.6 F (37 C)  97.9 F (36.6 C)   TempSrc: Oral  Oral   SpO2: 95% 90% 94% 98%  Weight: 129.4 kg  128.3 kg   Height: '6\' 1"'$  (1.854 m)      General exam: Alert, awake, oriented x 3; no chest pain, no nausea, no vomiting.  Overall feeling better and breathing easier; down to 4 L supplementation with good saturation.  Still short winded with activity and expressed mild difficulties breathing when laying down flat. Respiratory system: Decreased breath sounds at the bases without frank crackles on today's auscultation; positive expiratory wheezing and rhonchi bilaterally appreciated.  No using accessory muscles. Cardiovascular system:RRR. No rubs or gallops; unable to assess JVD with body habitus.  Positive murmur. Gastrointestinal system: Abdomen is obese, nondistended, soft and nontender. No organomegaly or masses felt. Normal bowel sounds heard. Central nervous system: Alert and oriented. No focal neurological deficits. Extremities: No cyanosis or clubbing; trace edema appreciated bilaterally. Skin: No petechiae. Psychiatry: Judgement and insight appear normal. Mood & affect appropriate.   Data Reviewed: Procalcitonin 0.19 Comprehensive metabolic panel: Sodium sodium 141, potassium 3.6, chloride 91, bicarb 41, BUN 31 and creatinine 0.93; GFR more than 60. Latest CBC: WBCs 13.7, hemoglobin 15.1 and platelet count 206K  Family Communication: Daughter updated at bedside on 10/26/2022  Disposition: Status is: Inpatient Remains  inpatient appropriate because: Continue treatment with IV diuresis, IV steroids and bronchodilator management.  Patient oxygen supplementation is still not back to baseline still short winded with activity.   Planned Discharge Destination: Home  Time spent: 50 minutes  Author: Barton Dubois, MD 10/28/2022 10:21 AM  For on call review [email or web address].

## 2022-10-29 ENCOUNTER — Telehealth: Payer: Self-pay | Admitting: Cardiovascular Disease

## 2022-10-29 DIAGNOSIS — R5381 Other malaise: Principal | ICD-10-CM

## 2022-10-29 DIAGNOSIS — E669 Obesity, unspecified: Secondary | ICD-10-CM

## 2022-10-29 DIAGNOSIS — E66812 Obesity, class 2: Secondary | ICD-10-CM

## 2022-10-29 LAB — BASIC METABOLIC PANEL
Anion gap: 12 (ref 5–15)
BUN: 26 mg/dL — ABNORMAL HIGH (ref 8–23)
CO2: 42 mmol/L — ABNORMAL HIGH (ref 22–32)
Calcium: 8.5 mg/dL — ABNORMAL LOW (ref 8.9–10.3)
Chloride: 89 mmol/L — ABNORMAL LOW (ref 98–111)
Creatinine, Ser: 0.89 mg/dL (ref 0.61–1.24)
GFR, Estimated: >60 mL/min (ref >60)
Glucose, Bld: 211 mg/dL — ABNORMAL HIGH (ref 70–99)
Potassium: 3.4 mmol/L — ABNORMAL LOW (ref 3.5–5.1)
Sodium: 143 mmol/L (ref 135–145)

## 2022-10-29 LAB — GLUCOSE, CAPILLARY
Glucose-Capillary: 188 mg/dL — ABNORMAL HIGH (ref 70–99)
Glucose-Capillary: 279 mg/dL — ABNORMAL HIGH (ref 70–99)

## 2022-10-29 MED ORDER — CEFDINIR 300 MG PO CAPS: 300.0000 mg | ORAL_CAPSULE | Freq: Two times a day (BID) | ORAL | 0 refills | Status: AC

## 2022-10-29 MED ORDER — BUDESONIDE-FORMOTEROL FUMARATE 160-4.5 MCG/ACT IN AERO: 2.0000 | INHALATION_SPRAY | Freq: Two times a day (BID) | RESPIRATORY_TRACT | 12 refills | Status: AC

## 2022-10-29 MED ORDER — PANTOPRAZOLE SODIUM 40 MG PO TBEC: 40.0000 mg | DELAYED_RELEASE_TABLET | Freq: Every day | ORAL | 1 refills | Status: DC

## 2022-10-29 MED ORDER — PREDNISONE 20 MG PO TABS: ORAL_TABLET | ORAL | 0 refills | Status: DC

## 2022-10-29 MED ORDER — FUROSEMIDE 40 MG PO TABS: 80.0000 mg | ORAL_TABLET | Freq: Two times a day (BID) | ORAL | 1 refills | Status: DC

## 2022-10-29 NOTE — Plan of Care (Signed)
  Problem: Education: Goal: Knowledge of General Education information will improve Description: Including pain rating scale, medication(s)/side effects and non-pharmacologic comfort measures Outcome: Progressing   Problem: Health Behavior/Discharge Planning: Goal: Ability to manage health-related needs will improve Outcome: Progressing   Problem: Clinical Measurements: Goal: Respiratory complications will improve Outcome: Progressing   Problem: Clinical Measurements: Goal: Cardiovascular complication will be avoided Outcome: Progressing   Problem: Activity: Goal: Risk for activity intolerance will decrease Outcome: Progressing   Problem: Nutrition: Goal: Adequate nutrition will be maintained Outcome: Progressing   Problem: Coping: Goal: Level of anxiety will decrease Outcome: Progressing   Problem: Pain Managment: Goal: General experience of comfort will improve Outcome: Progressing   Problem: Safety: Goal: Ability to remain free from injury will improve Outcome: Progressing

## 2022-10-29 NOTE — Care Management Important Message (Signed)
Important Message  Patient Details  Name: Steven Henry MRN: [id number] Date of Birth: 03-05-1951   Medicare Important Message Given:  Yes     Tommy Medal 10/29/2022, 10:47 AM

## 2022-10-29 NOTE — Assessment & Plan Note (Signed)
-  Body mass index is 36.59 kg/m. -Low-calorie diet, portion control and increase physical activity discussed with patient.

## 2022-10-29 NOTE — Plan of Care (Signed)
  Problem: Education: Goal: Knowledge of General Education information will improve Description Including pain rating scale, medication(s)/side effects and non-pharmacologic comfort measures Outcome: Adequate for Discharge   Problem: Health Behavior/Discharge Planning: Goal: Ability to manage health-related needs will improve Outcome: Adequate for Discharge   Problem: Clinical Measurements: Goal: Ability to maintain clinical measurements within normal limits will improve Outcome: Adequate for Discharge Goal: Will remain free from infection Outcome: Adequate for Discharge Goal: Diagnostic test results will improve Outcome: Adequate for Discharge Goal: Respiratory complications will improve Outcome: Adequate for Discharge Goal: Cardiovascular complication will be avoided Outcome: Adequate for Discharge   Problem: Activity: Goal: Risk for activity intolerance will decrease Outcome: Adequate for Discharge   Problem: Nutrition: Goal: Adequate nutrition will be maintained Outcome: Adequate for Discharge   Problem: Coping: Goal: Level of anxiety will decrease Outcome: Adequate for Discharge   Problem: Elimination: Goal: Will not experience complications related to bowel motility Outcome: Adequate for Discharge Goal: Will not experience complications related to urinary retention Outcome: Adequate for Discharge   Problem: Pain Managment: Goal: General experience of comfort will improve Outcome: Adequate for Discharge   Problem: Safety: Goal: Ability to remain free from injury will improve Outcome: Adequate for Discharge   Problem: Skin Integrity: Goal: Risk for impaired skin integrity will decrease Outcome: Adequate for Discharge   Problem: Education: Goal: Ability to demonstrate management of disease process will improve Outcome: Adequate for Discharge Goal: Ability to verbalize understanding of medication therapies will improve Outcome: Adequate for Discharge Goal:  Individualized Educational Video(s) Outcome: Adequate for Discharge   Problem: Activity: Goal: Capacity to carry out activities will improve Outcome: Adequate for Discharge   Problem: Cardiac: Goal: Ability to achieve and maintain adequate cardiopulmonary perfusion will improve Outcome: Adequate for Discharge   

## 2022-10-29 NOTE — Inpatient Diabetes Management (Signed)
Inpatient Diabetes Program Recommendations  AACE/ADA: New Consensus Statement on Inpatient Glycemic Control (2015)  Target Ranges:  Prepandial:   less than 140 mg/dL      Peak postprandial:   less than 180 mg/dL (1-2 hours)      Critically ill patients:  140 - 180 mg/dL   Lab Results  Component Value Date   GLUCAP 188 (H) 10/29/2022   HGBA1C 8.1 (H) 08/22/2022    Review of Glycemic Control  Latest Reference Range & Units 10/28/22 11:42 10/28/22 16:50 10/28/22 22:51 10/29/22 08:03  Glucose-Capillary 70 - 99 mg/dL 416 (H) 240 (H) 237 (H) 188 (H)  (H): Data is abnormally high Diabetes history: Type 2 DM Outpatient Diabetes medications: Jardiance 25 mg QD, Glipizide 10 mg QD, Metformin 1000 mg BID, Lantus 15 units QD, Ozempic 0.5 mg qwk Current orders for Inpatient glycemic control: Levemir 10 units BID, Novolog 0-15 units TID, Novolog 0-5 units QHS Solumedrol 40 mg BID  Inpatient Diabetes Program Recommendations:    Consider adding Novolog 4 units TID (assuming patient is consuming >50% of meals).   Thanks, Bronson Curb, MSN, RNC-OB Diabetes Coordinator [phone] (8a-5p)

## 2022-10-29 NOTE — Assessment & Plan Note (Signed)
-  Outpatient physical therapy has been recommended -TOC has helped with referral.

## 2022-10-29 NOTE — TOC Transition Note (Signed)
Transition of Care [facility]) - CM/SW Discharge Note   Patient Details  Name: Steven Henry MRN: [id number] Date of Birth: 1951/01/22  [facility] of [facility]) CM/SW Contact:  Salome Arnt, LCSW Phone Number: 10/29/2022, 10:56 AM   Clinical Narrative:  LCSW confirmed with pt request to go to outpatient physical therapy at [facility] in [place]. Referral faxed. No other needs reported.      Final next level of care: OP Rehab Barriers to Discharge: Barriers Resolved   Patient Goals and CMS Choice CMS Medicare.gov Compare Post Acute Care list provided to:: Patient Choice offered to / list presented to : Adult Children, Patient  Discharge Placement                         Discharge Plan and Services Additional resources added to the After Visit Summary for   In-house Referral: Clinical Social Work Discharge Planning Services: CM Consult                                 Social Determinants of Health (SDOH) Interventions SDOH Screenings   Food Insecurity: No Food Insecurity (10/26/2022)  Housing: Low Risk  (10/26/2022)  Transportation Needs: No Transportation Needs (10/26/2022)  Utilities: Not At Risk (10/26/2022)  Alcohol Screen: Low Risk  (09/06/2022)  Depression (PHQ2-9): Low Risk  (09/28/2022)  Financial Resource Strain: Low Risk  (09/06/2022)  Physical Activity: Sufficiently Active (09/06/2022)  Social Connections: Socially Isolated (09/06/2022)  Stress: No Stress Concern Present (09/06/2022)  Tobacco Use: High Risk (10/26/2022)     Readmission Risk Interventions     No data to display

## 2022-10-29 NOTE — Telephone Encounter (Signed)
Requested Prescriptions  Pending Prescriptions Disp Refills   Semaglutide,0.25 or 0.'5MG'$ /DOS, (OZEMPIC, 0.25 OR 0.5 MG/DOSE,) 2 MG/3ML SOPN [Pharmacy Med Name: Ozempic 0.25 mg or 0.5 mg (2 mg/3 mL) subcutaneous pen injector] 3 mL 0    Sig: Inject 0.5 mg into the skin every Thursday.     Endocrinology:  Diabetes - GLP-1 Receptor Agonists - semaglutide Failed - 10/27/2022  8:04 AM      Failed - HBA1C in normal range and within 180 days    Hgb A1c MFr Bld  Date Value Ref Range Status  08/22/2022 8.1 (H) <5.7 % of total Hgb Final    Comment:    For someone without known diabetes, a hemoglobin A1c value of 6.5% or greater indicates that they may have  diabetes and this should be confirmed with a follow-up  test. . For someone with known diabetes, a value <7% indicates  that their diabetes is well controlled and a value  greater than or equal to 7% indicates suboptimal  control. A1c targets should be individualized based on  duration of diabetes, age, comorbid conditions, and  other considerations. . Currently, no consensus exists regarding use of hemoglobin A1c for diagnosis of diabetes for children. .          Failed - Valid encounter within last 6 months    Recent Outpatient Visits           9 months ago Acute pain of left knee   [place] [facility] Schaumann, Cammie Mcgee, MD   12 months ago Rib pain on left side   [place] Pickard, Cammie Mcgee, MD   1 year ago Rib pain on left side   No Name Eulogio Bear, NP   1 year ago Uncontrolled type 2 diabetes mellitus with hyperosmolarity without coma, without long-term current use of insulin ([place])   [place] Pickard, Cammie Mcgee, MD   1 year ago Acute pulmonary edema ([place])   [place] Pickard, Cammie Mcgee, MD       Future Appointments             In 3 days Pickard, Cammie Mcgee, MD [place]   In 2 months Mall[place]ed[place],  Quenten Raven, MD [facility] [place] in normal range and within 360 days    Creat  Date Value Ref Range Status  10/22/2022 0.89 0.70 - 1.28 mg/dL Final   Creatinine, Ser  Date Value Ref Range Status  10/29/2022 0.89 0.61 - 1.24 mg/dL Final   Creatinine, Urine  Date Value Ref Range Status  08/22/2022 37 20 - 320 mg/dL Final

## 2022-10-29 NOTE — Telephone Encounter (Signed)
Patient's daughter Gregary Signs is calling wanting to know wanting to know why her father's procedure for tomorrow was canceled.  She said she got a call this morning about it.  Her father has been discharged from the hospital.

## 2022-10-29 NOTE — Discharge Summary (Signed)
Physician Discharge Summary   Patient: Steven Henry MRN: [id number] DOB: 1951/04/14  Admit date:     10/26/2022  Discharge date: 10/29/22  Discharge Physician: Barton Dubois   PCP: [name], MD   Recommendations at discharge:  Repeat basic metabolic panel to follow electrolytes and renal function Reassess blood pressure and adjust antihypertensive regimen as needed Please reassess patient volume status and further adjust diuretic regimen as needed.  Discharge Diagnoses: Principal Problem:   Acute on chronic respiratory failure with hypoxia (HCC) Active Problems:   DMII (diabetes mellitus, type 2) (HCC)   Essential hypertension   Acute on chronic diastolic CHF (congestive heart failure) (HCC)   Mixed hyperlipidemia   Atrial fibrillation (HCC)   Hypokalemia   Elevated troponin   Acute metabolic encephalopathy   Leukocytosis   Physical deconditioning   Class 2 obesity  Hospital Course: As per H&P written by Dr.Zierle-Ghosh on 10/26/22 Steven Henry is a 72 y.o. male with medical history significant of atrial fibrillation, CHF, COPD, diabetes mellitus type 2, hypertension, sleep apnea, and more presents the ED with a chief complaint of dyspnea.  Unfortunately, at the time of my exam patient is not able to provide any history.  He just cannot stay awake long enough to answer the questions.  He does say his full name, and several times after that when woken up to ask other questions he just repeats his full name.  He does eventually state that he had difficulty breathing and that it started yesterday.  He was just discharged from [facility] yesterday.  They apparently treated him for a CHF exacerbation.  Patient is somnolent during my exam, so adding on a VBG to check for CO2 retention.  Patient was not given any sedating medications prior to my exam.  Assessment and Plan: * Acute on chronic respiratory failure with hypoxia (HCC) - 4 L nasal cannula at baseline -  Requiring 7 L high flow nasal cannula supplementation at time of admission; with subsequent component of hypercapnia requiring BiPAP. -Patient respiratory distress secondary to COPD and CHF exacerbation. - Excellent response to IV diuresis; at discharge no orthopnea, no crackles, no lower extremity swelling and with oxygen supplementation back to baseline. -Patient will continue adjusted dose of Lasix (80 mg twice a day); instruction to follow low-sodium diet and check weight on daily basis. -As part of treatment for his COPD we will discharge on his steroids tapering, as needed use of home rescue inhaler and initiation of Symbicort. -Outpatient follow-up with pulmonologist for PFTs recommended. -Patient will also complete 3 more days of cefdinir for any bronchitis/bronchiectasis component.    Class 2 obesity -Body mass index is 36.59 kg/m. -Low-calorie diet, portion control and increase physical activity discussed with patient.  Physical deconditioning -Outpatient physical therapy has been recommended -TOC has helped with referral.  Leukocytosis - Appears to be related to a stress demargination -Procalcitonin 0.19 -Given potential underlying bronchiectasis with COPD exacerbation; patient will complete treatment with oral cefdinir.   Acute metabolic encephalopathy - Present at time of admission-most likely secondary to hypercapnia. -After usage of BiPAP patient's mentation has improved and back to baseline -Continue CPAP nightly.  Elevated troponin -Flat elevation in the setting of demand ischemia secondary to COPD and CHF exacerbation. -Patient denies chest pain. -No acute ischemic changes appreciated on EKG or telemetry throughout hospitalization -Continue patient follow-up with cardiology service.  Hypokalemia - Repleted and is stable at discharge -Continue to follow trend.  Atrial fibrillation (HCC) -Continue beta-blocker  and Eliquis - Rate controlled. -Continue telemetry  monitoring; follow electrolytes and maintain adequate levels (goal is for potassium of 4 and magnesium around 2).  Mixed hyperlipidemia - Continue statin -Heart healthy diet discussed with patient.  Acute on chronic diastolic CHF (congestive heart failure) (HCC) - Reports breathing back to his baseline; no orthopnea. -Patient also demonstrating Peripheral edema and chest x-ray at time of admission with vascular congestion and interstitial edema. -Lower extremity swelling resolved and no crackles appreciated on physical exam. -Patient is speaking in full sentences and reporting good saturation on chronic supplementation of 4 L. -Continue low-sodium diet, daily weights and adjusted diuretic dosage at time of discharge.  Essential hypertension -Continue bisoprolol -Blood pressure overall stable and well-controlled -Continue to follow vital sign -Heart healthy diet discussed with patient.  DMII (diabetes mellitus, type 2) ([place]) - Resume home hypoglycemic regimen -Patient advised to follow modified carbohydrate diet and maintain adequate hydration.    Consultants: None Procedures performed: See below for x-ray reports Disposition: Home Diet recommendation: Heart healthy and modified carbohydrate diet.  DISCHARGE MEDICATION: Allergies as of 10/29/2022   No Known Allergies      Medication List     STOP taking these medications    glipiZIDE 10 MG tablet Commonly known as: GLUCOTROL       TAKE these medications    Accu-Chek FastClix Lancet Kit Check BS QAM - DX: E11.9   Accu-Chek FastClix Lancets Misc Check BS QAM - DX: E11.9   atorvastatin 40 MG tablet Commonly known as: LIPITOR TAKE 1 TABLET BY MOUTH DAILY What changed: when to take this   bisoprolol 5 MG tablet Commonly known as: ZEBETA Take 1 tablet (5 mg total) by mouth daily. What changed: when to take this   budesonide-formoterol 160-4.5 MCG/ACT inhaler Commonly known as: Symbicort Inhale 2 puffs into  the lungs in the morning and at bedtime.   cefdinir 300 MG capsule Commonly known as: OMNICEF Take 1 capsule (300 mg total) by mouth every 12 (twelve) hours for 3 days.   Eliquis 5 MG Tabs tablet Generic drug: apixaban TAKE 1 TABLET BY MOUTH TWICE DAILY   empagliflozin 25 MG Tabs tablet Commonly known as: JARDIANCE Take 1 tablet (25 mg total) by mouth daily before breakfast.   FreeStyle Libre 2 Reader Kerrin Mo For continuous blood glucose monitoring   FreeStyle Libre 2 Sensor Misc APPLY 1 SENSOR ON THE SKIN EVERY 14 DAYS, USE AS DIRECTED TO CHECK BLOOD SUGAR   furosemide 40 MG tablet Commonly known as: LASIX Take 2 tablets (80 mg total) by mouth 2 (two) times daily. What changed:  how much to take when to take this   insulin glargine 100 UNIT/ML injection Commonly known as: Lantus Inject 0.25 mLs (25 Units total) into the skin daily. PLEASE INCREASE INSULIN BY 1 UNIT DAILY UNTIL FBG IS <130, THEN HOLD AT THAT DOSE What changed:  how much to take additional instructions   ipratropium-albuterol 0.5-2.5 (3) MG/3ML Soln Commonly known as: DUONEB Take 3 mLs by nebulization 3 (three) times daily.   metFORMIN 1000 MG tablet Commonly known as: GLUCOPHAGE TAKE 1 TABLET BY MOUTH TWICE DAILY WITH a meal   multivitamin capsule Take 1 capsule by mouth in the morning. Mens one a day   ONE TOUCH ULTRA 2 w/Device Kit USETO CHECK BLOOD SUGAR EVERY MORNING   OneTouch Ultra test strip Generic drug: glucose blood USE TO CHECK BLOOD SUGAR EVERY MORNING   Ozempic (0.25 or 0.5 MG/DOSE) 2 MG/3ML Sopn Generic drug:  Semaglutide(0.25 or 0.'5MG'$ /DOS) Inject 0.5 mg into the skin every Thursday. Start taking on: November 01, 2022   pantoprazole 40 MG tablet Commonly known as: PROTONIX Take 1 tablet (40 mg total) by mouth daily.   potassium chloride SA 20 MEQ tablet Commonly known as: Klor-Con M20 Take 1 tablet (20 mEq total) by mouth daily.   predniSONE 20 MG tablet Commonly known as:  DELTASONE Take 3 tablets by mouth daily X 1 day; then 2 tablets by mouth daily X 2 days, then 1 tablet by mouth daily X 3 days; then 1/2 tablet daily X 3 days and stop prednisone.   Vitamin D3 10 MCG (400 UNIT) tablet Take 800 Units by mouth daily.   ZINC PO Take 30 mg by mouth at bedtime.        Follow-up Information     [name], MD. Schedule an appointment as soon as possible for a visit in 10 day(s).   Specialty: Family Medicine Contact information: [address] [place] [address] [phone]                Discharge Exam: Danley Danker Weights   10/27/22 2023 10/28/22 0631 10/29/22 0503  Weight: 129.4 kg 128.3 kg 125.8 kg   General exam: Alert, awake, oriented x 3; reporting breathing back to baseline and is speaking in full sentences.  Good saturation on 4 L nasal cannula supplementation.  Patient reports no orthopnea. Respiratory system: Very little wheezing and positive rhonchi; no crackles, no using accessory muscle. Cardiovascular system:RRR. No rubs or gallops; positive murmur. Gastrointestinal system: Abdomen is obese, nondistended, soft and nontender. No organomegaly or masses felt. Normal bowel sounds heard. Central nervous system: Alert and oriented. No focal neurological deficits. Extremities: No cyanosis, clubbing or edema. Skin: No petechiae. Psychiatry: Judgement and insight appear normal. Mood & affect appropriate.    Condition at discharge: Stable and improved.  The results of significant diagnostics from this hospitalization (including imaging, microbiology, ancillary and laboratory) are listed below for reference.   Imaging Studies: ECHOCARDIOGRAM COMPLETE  Result Date: 10/26/2022    ECHOCARDIOGRAM REPORT   Patient Name:   Steven Henry Date of Exam: 10/26/2022 Medical Rec #:  [id number]           Height:       73.0 in Accession #:    [id number]          Weight:       286.6 lb Date of Birth:  03-Aug-1951           BSA:           2.507 m Patient Age:    14 years            BP:           104/65 mmHg Patient Gender: M                   HR:           77 bpm. Exam Location:  [facility] Procedure: 2D Echo, Cardiac Doppler, Color Doppler and Intracardiac            Opacification Agent Indications:    Congestive Heart Failure I50.9  History:        Patient has prior history of Echocardiogram examinations, most                 recent 02/26/2022. Pacemaker, Arrythmias:Atrial Fibrillation,  Signs/Symptoms:Dyspnea; Risk Factors:Sleep Apnea, Current                 Smoker, Diabetes, Hypertension and Dyslipidemia.  Sonographer:    Greer Pickerel Referring Phys: [id number] ASIA B [place]  Sonographer Comments: Technically difficult study due to poor echo windows. Image acquisition challenging due to patient body habitus, Image acquisition challenging due to respiratory motion and Image acquisition challenging due to COPD. IMPRESSIONS  1. Left ventricular ejection fraction, by estimation, is 55 to 60%. The left ventricle has normal function. The left ventricle has no regional wall motion abnormalities. Left ventricular diastolic function could not be evaluated.  2. Right ventricular systolic function is mildly reduced. The right ventricular size is mildly enlarged. There is mildly elevated pulmonary artery systolic pressure. The estimated right ventricular systolic pressure is 14.4 mmHg.  3. Left atrial size was severely dilated.  4. Right atrial size was moderately dilated.  5. The mitral valve is grossly normal. No evidence of mitral valve regurgitation. No evidence of mitral stenosis.  6. The aortic valve is calcified. There is moderate calcification of the aortic valve. Aortic valve regurgitation is not visualized. Moderate to severe aortic valve stenosis. Aortic valve area, by VTI measures 1.27 cm. Aortic valve mean gradient measures 34.5 mmHg. Aortic valve Vmax measures 4.15 m/s.  7. Aortic dilatation noted. There is borderline  dilatation of the aortic root, measuring 39 mm. There is mild dilatation of the ascending aorta, measuring 40 mm.  8. The inferior vena cava is dilated in size with <50% respiratory variability, suggesting right atrial pressure of 15 mmHg. Comparison(s): No significant change from prior study. FINDINGS  Left Ventricle: Left ventricular ejection fraction, by estimation, is 55 to 60%. The left ventricle has normal function. The left ventricle has no regional wall motion abnormalities. Definity contrast agent was given IV to delineate the left ventricular  endocardial borders. The left ventricular internal cavity size was normal in size. There is no left ventricular hypertrophy. Left ventricular diastolic function could not be evaluated due to atrial fibrillation. Left ventricular diastolic function could  not be evaluated. Right Ventricle: The right ventricular size is mildly enlarged. No increase in right ventricular wall thickness. Right ventricular systolic function is mildly reduced. There is mildly elevated pulmonary artery systolic pressure. The tricuspid regurgitant  velocity is 2.67 m/s, and with an assumed right atrial pressure of 15 mmHg, the estimated right ventricular systolic pressure is 31.5 mmHg. Left Atrium: Left atrial size was severely dilated. Right Atrium: Right atrial size was moderately dilated. Pericardium: There is no evidence of pericardial effusion. Mitral Valve: The mitral valve is grossly normal. No evidence of mitral valve regurgitation. No evidence of mitral valve stenosis. Tricuspid Valve: The tricuspid valve is not well visualized. Tricuspid valve regurgitation is not demonstrated. No evidence of tricuspid stenosis. Aortic Valve: The aortic valve is calcified. There is moderate calcification of the aortic valve. Aortic valve regurgitation is not visualized. Moderate to severe aortic stenosis is present. Aortic valve mean gradient measures 34.5 mmHg. Aortic valve peak gradient measures  68.9 mmHg. Aortic valve area, by VTI measures 1.27 cm. Pulmonic Valve: The pulmonic valve was not well visualized. Pulmonic valve regurgitation is not visualized. No evidence of pulmonic stenosis. Aorta: Aortic dilatation noted. There is borderline dilatation of the aortic root, measuring 39 mm. There is mild dilatation of the ascending aorta, measuring 40 mm. Venous: The inferior vena cava is dilated in size with less than 50% respiratory variability, suggesting right atrial pressure of 15  mmHg. IAS/Shunts: No atrial level shunt detected by color flow Doppler.  LEFT VENTRICLE PLAX 2D LVIDd:         5.30 cm   Diastology LVIDs:         4.10 cm   LV e' medial:    7.30 cm/s LV PW:         1.20 cm   LV E/e' medial:  14.2 LV IVS:        1.00 cm   LV e' lateral:   11.00 cm/s LVOT diam:     2.50 cm   LV E/e' lateral: 9.5 LV SV:         96 LV SV Index:   38 LVOT Area:     4.91 cm  RIGHT VENTRICLE RV S prime:     9.48 cm/s TAPSE (M-mode): 1.5 cm LEFT ATRIUM              Index        RIGHT ATRIUM           Index LA diam:        5.90 cm  2.35 cm/m   RA Area:     29.50 cm LA Vol (A2C):   147.0 ml 58.63 ml/m  RA Volume:   106.00 ml 42.28 ml/m LA Vol (A4C):   138.0 ml 55.04 ml/m LA Biplane Vol: 154.0 ml 61.43 ml/m  AORTIC VALVE AV Area (Vmax):    1.41 cm AV Area (Vmean):   1.35 cm AV Area (VTI):     1.27 cm AV Vmax:           415.00 cm/s AV Vmean:          276.000 cm/s AV VTI:            0.758 m AV Peak Grad:      68.9 mmHg AV Mean Grad:      34.5 mmHg LVOT Vmax:         119.00 cm/s LVOT Vmean:        75.800 cm/s LVOT VTI:          0.196 m LVOT/AV VTI ratio: 0.26  AORTA Ao Root diam: 3.90 cm Ao Asc diam:  4.00 cm MITRAL VALVE                TRICUSPID VALVE MV Area (PHT): 2.90 cm     TR Peak grad:   28.5 mmHg MV Decel Time: 262 msec     TR Vmax:        267.00 cm/s MR Peak grad: 98.0 mmHg MR Vmax:      495.00 cm/s   SHUNTS MV E velocity: 104.00 cm/s  Systemic VTI:  0.20 m                             Systemic Diam: 2.50  cm Vishnu Priya Mallipeddi Electronically signed by Lorelee Cover Mallipeddi Signature Date/Time: 10/26/2022/1:40:59 PM    Final    DG Chest Port 1 View  Result Date: 10/26/2022 CLINICAL DATA:  Continued shortness of breath EXAM: PORTABLE CHEST 1 VIEW COMPARISON:  10/22/2022 FINDINGS: Stable cardiomegaly. Aortic atherosclerotic calcification. Left chest wall pacemaker similar diffuse interstitial opacities suggestive of edema. No pleural effusion or pneumothorax. IMPRESSION: Cardiomegaly and interstitial edema similar to prior. Electronically Signed   By: Placido Sou M.D.   On: 10/26/2022 02:15    Microbiology: Results for orders placed or performed during the hospital encounter of  10/26/22  Resp panel by RT-PCR (RSV, Flu A&B, Covid) Anterior Nasal Swab     Status: None   Collection Time: 10/26/22  1:39 AM   Specimen: Anterior Nasal Swab  Result Value Ref Range Status   SARS Coronavirus 2 by RT PCR NEGATIVE NEGATIVE Final    Comment: (NOTE) SARS-CoV-2 target nucleic acids are NOT DETECTED.  The SARS-CoV-2 RNA is generally detectable in upper respiratory specimens during the acute phase of infection. The lowest concentration of SARS-CoV-2 viral copies this assay can detect is 138 copies/mL. A negative result does not preclude SARS-Cov-2 infection and should not be used as the sole basis for treatment or other patient management decisions. A negative result may occur with  improper specimen collection/handling, submission of specimen other than nasopharyngeal swab, presence of viral mutation(s) within the areas targeted by this assay, and inadequate number of viral copies(<138 copies/mL). A negative result must be combined with clinical observations, patient history, and epidemiological information. The expected result is Negative.  Fact Sheet for Patients:  EntrepreneurPulse.com.au  Fact Sheet for Healthcare Providers:   IncredibleEmployment.be  This test is no t yet approved or cleared by the [place] FDA and  has been authorized for detection and/or diagnosis of SARS-CoV-2 by FDA under an Emergency Use Authorization (EUA). This EUA will remain  in effect (meaning this test can be used) for the duration of the COVID-19 declaration under Section 564(b)(1) of the Act, 21 U.S.C.section 360bbb-3(b)(1), unless the authorization is terminated  or revoked sooner.       Influenza A by PCR NEGATIVE NEGATIVE Final   Influenza B by PCR NEGATIVE NEGATIVE Final    Comment: (NOTE) The Xpert Xpress SARS-CoV-2/FLU/RSV plus assay is intended as an aid in the diagnosis of influenza from Nasopharyngeal swab specimens and should not be used as a sole basis for treatment. Nasal washings and aspirates are unacceptable for Xpert Xpress SARS-CoV-2/FLU/RSV testing.  Fact Sheet for Patients: EntrepreneurPulse.com.au  Fact Sheet for Healthcare Providers: IncredibleEmployment.be  This test is not yet approved or cleared by the [place] FDA and has been authorized for detection and/or diagnosis of SARS-CoV-2 by FDA under an Emergency Use Authorization (EUA). This EUA will remain in effect (meaning this test can be used) for the duration of the COVID-19 declaration under Section 564(b)(1) of the Act, 21 U.S.C. section 360bbb-3(b)(1), unless the authorization is terminated or revoked.     Resp Syncytial Virus by PCR NEGATIVE NEGATIVE Final    Comment: (NOTE) Fact Sheet for Patients: EntrepreneurPulse.com.au  Fact Sheet for Healthcare Providers: IncredibleEmployment.be  This test is not yet approved or cleared by the [place] FDA and has been authorized for detection and/or diagnosis of SARS-CoV-2 by FDA under an Emergency Use Authorization (EUA). This EUA will remain in effect (meaning this test can be used) for  the duration of the COVID-19 declaration under Section 564(b)(1) of the Act, 21 U.S.C. section 360bbb-3(b)(1), unless the authorization is terminated or revoked.  Performed at [facility], [address] [place],  [address]   MRSA Next Gen by PCR, Nasal     Status: None   Collection Time: 10/26/22  4:30 AM   Specimen: Nasal Mucosa; Nasal Swab  Result Value Ref Range Status   MRSA by PCR Next Gen NOT DETECTED NOT DETECTED Final    Comment: (NOTE) The GeneXpert MRSA Assay (FDA approved for NASAL specimens only), is one component of a comprehensive MRSA colonization surveillance program. It is not intended to diagnose MRSA infection nor  to guide or monitor treatment for MRSA infections. Test performance is not FDA approved in patients less than 48 years old. Performed at [facility], [address], [place], [place] [address]     Labs: CBC: Recent Labs  Lab 10/26/22 0139 10/26/22 0534  WBC 13.3* 13.7*  NEUTROABS 11.1* 11.7*  HGB 15.2 15.1  HCT 48.6 48.2  MCV 101.9* 101.7*  PLT 215 655   Basic Metabolic Panel: Recent Labs  Lab 10/26/22 0139 10/26/22 0346 10/26/22 0534 10/28/22 0609 10/28/22 1253 10/29/22 0446  NA 136  --  137 141  --  143  K 3.4*  --  3.5 3.6  --  3.4*  CL 88*  --  95* 91*  --  89*  CO2 35*  --  36* 41*  --  42*  GLUCOSE 197*  --  182* 205* 320* 211*  BUN 22  --  22 31*  --  26*  CREATININE 0.98  --  0.86 0.93  --  0.89  CALCIUM 9.0  --  8.7* 8.7*  --  8.5*  MG  --  2.5*  --   --   --   --    Liver Function Tests: Recent Labs  Lab 10/26/22 0139 10/26/22 0534  AST 23 16  ALT 16 15  ALKPHOS 72 68  BILITOT 0.6 0.9  PROT 8.1 7.9  ALBUMIN 3.4* 3.4*   CBG: Recent Labs  Lab 10/28/22 1142 10/28/22 1650 10/28/22 2251 10/29/22 0803 10/29/22 1138  GLUCAP 416* 240* 237* 188* 279*    Discharge time spent: greater than 30 minutes.  Signed: Barton Dubois, MD [facility] 10/29/2022

## 2022-10-29 NOTE — Telephone Encounter (Signed)
Confirmed with scheduling cardioversion was canceled by cardmaster this afternoon due to pt being on oxygen and still admitted at 1pm. Discussed with Gregary Signs  pts daughter. Will reschedule dccv first available.

## 2022-10-30 ENCOUNTER — Encounter (HOSPITAL_COMMUNITY): Admission: RE | Payer: Self-pay | Source: Ambulatory Visit

## 2022-10-30 ENCOUNTER — Ambulatory Visit (HOSPITAL_COMMUNITY): Admission: RE | Admit: 2022-10-30 | Payer: Medicare Other | Source: Ambulatory Visit | Admitting: Cardiovascular Disease

## 2022-10-30 ENCOUNTER — Other Ambulatory Visit (HOSPITAL_COMMUNITY): Payer: Self-pay | Admitting: *Deleted

## 2022-10-30 DIAGNOSIS — I4819 Other persistent atrial fibrillation: Secondary | ICD-10-CM

## 2022-10-30 SURGERY — CARDIOVERSION
Anesthesia: General

## 2022-10-30 NOTE — Telephone Encounter (Signed)
Sending the requested confirmation patient is still in AFib from today's transmission:

## 2022-10-30 NOTE — Telephone Encounter (Signed)
Cardioversion rescheduled for 2/12 arrival at 10am. Hold ozempic on this Thursday. Daughter verbalized understanding.

## 2022-11-01 ENCOUNTER — Ambulatory Visit (INDEPENDENT_AMBULATORY_CARE_PROVIDER_SITE_OTHER): Payer: Medicare Other | Admitting: Family Medicine

## 2022-11-01 ENCOUNTER — Encounter: Payer: Self-pay | Admitting: Family Medicine

## 2022-11-01 ENCOUNTER — Other Ambulatory Visit: Payer: Self-pay

## 2022-11-01 VITALS — BP 126/74 | HR 82 | Temp 98.5°F | Ht 73.0 in | Wt 287.0 lb

## 2022-11-01 DIAGNOSIS — I5031 Acute diastolic (congestive) heart failure: Secondary | ICD-10-CM

## 2022-11-01 DIAGNOSIS — Z79899 Other long term (current) drug therapy: Secondary | ICD-10-CM

## 2022-11-01 MED ORDER — SPIRONOLACTONE 25 MG PO TABS: 25.0000 mg | ORAL_TABLET | Freq: Every day | ORAL | 1 refills | Status: DC

## 2022-11-01 NOTE — Progress Notes (Signed)
Subjective:    Patient was recently admitted to the hospital with acute respiratory failure and hypercapnia.  He was hypersomnolent and confused on admission with a venous blood gas pCO2 greater than 100.  He underwent IV diuresis with Lasix and was discharged with Lasix 80 mg twice a day.  He is here today for follow-up.  He was also suspected to have COPD contributing to his hypercapnia and was started on Symbicort.  He is feeling some better today however on his exam he has bibasilar crackles and pitting edema in his lower extremity suggesting that he continue diuresis.  Patient clearly has congestive heart failure with preserved ejection fraction.  Echocardiogram showed an ejection fraction of 60%.  He did have right ventricular dilatation and increased pulmonary artery pressure.  Chest x-ray showed vascular congestion throughout and cardiomegaly. Past Medical History:  Diagnosis Date   A-fib ([place])    CHF (congestive heart failure) (HCC)    CKD (chronic kidney disease), stage III (HCC)    Colon polyps    Diabetes mellitus type II, uncontrolled    Diabetes mellitus without complication ([place])    Hypertension    Morbid obesity ([place])    PVD (peripheral vascular disease) ([place])    right femoral artery   Sleep apnea    not on CPAP    Sleep apnea    Past Surgical History:  Procedure Laterality Date   COLONOSCOPY N/A 09/16/2015   Procedure: COLONOSCOPY;  Surgeon: Daneil Dolin, MD;  Location: [facility];  Service: Endoscopy;  Laterality: N/A;  8:15 AM - pt has c pap   ENDOVENOUS ABLATION SAPHENOUS VEIN W/ LASER Right 08/26/2017   endovenous laser ablation right greater saphenous vein by Tinnie Gens MD    PACEMAKER IMPLANT N/A 09/14/2022   Procedure: PACEMAKER IMPLANT;  Surgeon: Deboraha Sprang, MD;  Location: [facility];  Service: Cardiovascular;  Laterality: N/A;   stab phlebectomy  Right 01/07/2018   stab phlebectomy 10-20 incisions right leg by Tinnie Gens MD    Current  Outpatient Medications on File Prior to Visit  Medication Sig Dispense Refill   Accu-Chek FastClix Lancets MISC Check BS QAM - DX: E11.9 100 each 3   atorvastatin (LIPITOR) 40 MG tablet TAKE 1 TABLET BY MOUTH DAILY (Patient taking differently: Take 40 mg by mouth every evening.) 90 tablet 3   bisoprolol (ZEBETA) 5 MG tablet Take 1 tablet (5 mg total) by mouth daily. (Patient taking differently: Take 5 mg by mouth every evening.) 90 tablet 3   Blood Glucose Monitoring Suppl (ONE TOUCH ULTRA 2) w/Device KIT USETO CHECK BLOOD SUGAR EVERY MORNING 1 kit 0   budesonide-formoterol (SYMBICORT) 160-4.5 MCG/ACT inhaler Inhale 2 puffs into the lungs in the morning and at bedtime. 1 each 12   cefdinir (OMNICEF) 300 MG capsule Take 1 capsule (300 mg total) by mouth every 12 (twelve) hours for 3 days. 6 capsule 0   Cholecalciferol (VITAMIN D3) 10 MCG (400 UNIT) tablet Take 800 Units by mouth daily.     Continuous Blood Gluc Receiver (FREESTYLE LIBRE 2 READER) DEVI For continuous blood glucose monitoring 1 each 1   Continuous Blood Gluc Sensor (FREESTYLE LIBRE 2 SENSOR) MISC APPLY 1 SENSOR ON THE SKIN EVERY 14 DAYS, USE AS DIRECTED TO CHECK BLOOD SUGAR 2 each 3   ELIQUIS 5 MG TABS tablet TAKE 1 TABLET BY MOUTH TWICE DAILY 60 tablet 3   empagliflozin (JARDIANCE) 25 MG TABS tablet Take 1 tablet (25 mg total)  by mouth daily before breakfast. 30 tablet 3   furosemide (LASIX) 40 MG tablet Take 2 tablets (80 mg total) by mouth 2 (two) times daily. 180 tablet 1   insulin glargine (LANTUS) 100 UNIT/ML injection Inject 0.25 mLs (25 Units total) into the skin daily. PLEASE INCREASE INSULIN BY 1 UNIT DAILY UNTIL FBG IS <130, THEN HOLD AT THAT DOSE (Patient taking differently: Inject 15 Units into the skin daily.) 30 mL 5   ipratropium-albuterol (DUONEB) 0.5-2.5 (3) MG/3ML SOLN Take 3 mLs by nebulization 3 (three) times daily. 360 mL 1   Lancets Misc. (ACCU-CHEK FASTCLIX LANCET) KIT Check BS QAM - DX: E11.9 1 kit 0    metFORMIN (GLUCOPHAGE) 1000 MG tablet TAKE 1 TABLET BY MOUTH TWICE DAILY WITH a meal 180 tablet 3   Multiple Vitamin (MULTIVITAMIN) capsule Take 1 capsule by mouth in the morning. Mens one a day     Multiple Vitamins-Minerals (ZINC PO) Take 30 mg by mouth at bedtime.     ONETOUCH ULTRA test strip USE TO CHECK BLOOD SUGAR EVERY MORNING 100 strip 12   pantoprazole (PROTONIX) 40 MG tablet Take 1 tablet (40 mg total) by mouth daily. 30 tablet 1   Semaglutide,0.25 or 0.'5MG'$ /DOS, (OZEMPIC, 0.25 OR 0.5 MG/DOSE,) 2 MG/3ML SOPN Inject 0.5 mg into the skin every Thursday. 3 mL 0   No current facility-administered medications on file prior to visit.   No Known Allergies Social History   Socioeconomic History   Marital status: Divorced    Spouse name: Not on file   Number of children: 2   Years of education: Not on file   Highest education level: Not on file  Occupational History   Occupation: Self Employed  Tobacco Use   Smoking status: Every Day    Packs/day: 1.50    Years: 30.00    Total pack years: 45.00    Types: Cigarettes    Start date: 04/04/1958   Smokeless tobacco: Never   Tobacco comments:    Currently smoke 1 ppd 10/12/22  Vaping Use   Vaping Use: Never used  Substance and Sexual Activity   Alcohol use: Yes    Alcohol/week: 1.0 standard drink of alcohol    Types: 1 Cans of beer per week    Comment: 1 beer once a month 10/12/22   Drug use: No   Sexual activity: Not on file    Comment: divorced, 2 daughters.  works Psychologist, occupational for fairs, Social research officer, government.  Other Topics Concern   Not on file  Social History Narrative   Not on file   Social Determinants of Health   Financial Resource Strain: Low Risk  (09/06/2022)   Overall Financial Resource Strain (CARDIA)    Difficulty of Paying Living Expenses: Not hard at all  Food Insecurity: No Food Insecurity (10/26/2022)   Hunger Vital Sign    Worried About Running Out of Food in the Last Year: Never true    [place] in the  Last Year: Never true  Transportation Needs: No Transportation Needs (10/26/2022)   PRAPARE - Hydrologist (Medical): No    Lack of Transportation (Non-Medical): No  Physical Activity: Sufficiently Active (09/06/2022)   Exercise Vital Sign    Days of Exercise per Week: 5 days    Minutes of Exercise per Session: 30 min  Stress: No Stress Concern Present (09/06/2022)   [place]    Feeling of Stress : Not  at all  Social Connections: Socially Isolated (09/06/2022)   Social Connection and Isolation Panel [NHANES]    Frequency of Communication with Friends and Family: More than three times a week    Frequency of Social Gatherings with Friends and Family: Three times a week    Attends Religious Services: Never    Active Member of Clubs or Organizations: No    Attends Archivist Meetings: Never    Marital Status: Divorced  Human resources officer Violence: Not At Risk (10/26/2022)   Humiliation, Afraid, Rape, and Kick questionnaire    Fear of Current or Ex-Partner: No    Emotionally Abused: No    Physically Abused: No    Sexually Abused: No     Review of Systems  All other systems reviewed and are negative.      Objective:   Physical Exam Vitals reviewed.  Constitutional:      General: He is not in acute distress.    Appearance: Normal appearance. He is obese. He is not ill-appearing or toxic-appearing.  HENT:     Head: Normocephalic and atraumatic.     Right Ear: Tympanic membrane and ear canal normal.     Left Ear: Tympanic membrane and ear canal normal.     Nose: Nose normal. No congestion or rhinorrhea.     Mouth/Throat:     Mouth: Mucous membranes are moist.     Pharynx: Oropharynx is clear. No oropharyngeal exudate or posterior oropharyngeal erythema.  Eyes:     General: No scleral icterus.       Right eye: No discharge.        Left eye: No discharge.     Extraocular  Movements: Extraocular movements intact.     Conjunctiva/sclera: Conjunctivae normal.     Pupils: Pupils are equal, round, and reactive to light.  Neck:     Vascular: No carotid bruit.  Cardiovascular:     Rate and Rhythm: Normal rate. Rhythm irregular.     Pulses: Normal pulses.     Heart sounds: Normal heart sounds. No murmur heard.    No friction rub. No gallop.  Pulmonary:     Effort: Pulmonary effort is normal. No respiratory distress.     Breath sounds: No stridor. Rales present. No wheezing or rhonchi.  Chest:     Chest wall: No tenderness.  Abdominal:     General: Abdomen is flat. Bowel sounds are normal. There is no distension.     Palpations: Abdomen is soft. There is no mass.     Tenderness: There is no abdominal tenderness. There is no guarding or rebound.     Hernia: No hernia is present.  Musculoskeletal:     Cervical back: Normal range of motion and neck supple.     Right lower leg: Edema present.     Left lower leg: Edema present.  Lymphadenopathy:     Cervical: No cervical adenopathy.  Skin:    Coloration: Skin is not jaundiced.     Findings: No bruising, erythema, lesion or rash.  Neurological:     General: No focal deficit present.     Mental Status: He is alert and oriented to person, place, and time.     Cranial Nerves: No cranial nerve deficit.     Sensory: No sensory deficit.     Motor: No weakness.     Coordination: Coordination normal.     Gait: Gait normal.     Deep Tendon Reflexes: Reflexes normal.  Psychiatric:  Mood and Affect: Mood normal.        Behavior: Behavior normal.        Thought Content: Thought content normal.        Judgment: Judgment normal.       Assessment & Pl  Acute heart failure with preserved ejection fraction (HCC) - Plan: COMPLETE METABOLIC PANEL WITH GFR, Brain natriuretic peptide Patient acute continues to appear fluid overloaded on exam.  Continue Lasix 80 mg twice daily.  Discontinue potassium.  Patient is  already on Jardiance.  Recommended starting spironolactone 25 mg daily and recheck potassium in 2 weeks.  Will optimize therapy for congestive heart failure with preserved ejection fraction.  May consider adding Entresto in the future.  Recommended compliance with CPAP for his obstructive sleep apnea as well as smoking cessation.  Also recommended treatment for possible underlying COPD.  Continue Symbicort at the present time.  Has an appointment to see pulmonology upcoming pulmonary function test.  Recheck patient in 2 weeks or sooner if worsening

## 2022-11-02 ENCOUNTER — Other Ambulatory Visit: Payer: Self-pay | Admitting: Family Medicine

## 2022-11-02 ENCOUNTER — Telehealth: Payer: Self-pay

## 2022-11-02 ENCOUNTER — Other Ambulatory Visit: Payer: Self-pay

## 2022-11-02 DIAGNOSIS — J9601 Acute respiratory failure with hypoxia: Secondary | ICD-10-CM | POA: Diagnosis not present

## 2022-11-02 DIAGNOSIS — R1319 Other dysphagia: Secondary | ICD-10-CM

## 2022-11-02 DIAGNOSIS — J449 Chronic obstructive pulmonary disease, unspecified: Secondary | ICD-10-CM | POA: Diagnosis not present

## 2022-11-02 DIAGNOSIS — R1310 Dysphagia, unspecified: Secondary | ICD-10-CM

## 2022-11-02 LAB — COMPLETE METABOLIC PANEL WITH GFR
AG Ratio: 1.1 (calc) (ref 1.0–2.5)
ALT: 16 U/L (ref 9–46)
AST: 16 U/L (ref 10–35)
Albumin: 3.7 g/dL (ref 3.6–5.1)
Alkaline phosphatase (APISO): 64 U/L (ref 35–144)
BUN: 22 mg/dL (ref 7–25)
CO2: 37 mmol/L — ABNORMAL HIGH (ref 20–32)
Calcium: 9.8 mg/dL (ref 8.6–10.3)
Chloride: 91 mmol/L — ABNORMAL LOW (ref 98–110)
Creat: 0.9 mg/dL (ref 0.70–1.28)
Globulin: 3.4 g/dL (calc) (ref 1.9–3.7)
Glucose, Bld: 261 mg/dL — ABNORMAL HIGH (ref 65–99)
Potassium: 3.9 mmol/L (ref 3.5–5.3)
Sodium: 140 mmol/L (ref 135–146)
Total Bilirubin: 0.7 mg/dL (ref 0.2–1.2)
Total Protein: 7.1 g/dL (ref 6.1–8.1)
eGFR: 91 mL/min/{1.73_m2} (ref >=60)

## 2022-11-02 LAB — BRAIN NATRIURETIC PEPTIDE: Brain Natriuretic Peptide: 42 pg/mL (ref <100)

## 2022-11-02 NOTE — Telephone Encounter (Signed)
Pt called and states he has been having issues with difficulty swallowing for the last 3 weeks. Pt asks if that could be related to his CHF? Thank you.

## 2022-11-05 ENCOUNTER — Ambulatory Visit (HOSPITAL_COMMUNITY): Payer: Medicare Other | Admitting: Anesthesiology

## 2022-11-05 ENCOUNTER — Encounter (HOSPITAL_COMMUNITY)
Admission: RE | Disposition: A | Discharge status: Home / Self Care | Payer: Self-pay | Source: Ambulatory Visit | Attending: Internal Medicine

## 2022-11-05 ENCOUNTER — Encounter (HOSPITAL_COMMUNITY): Payer: Self-pay | Admitting: Internal Medicine

## 2022-11-05 ENCOUNTER — Other Ambulatory Visit: Payer: Self-pay

## 2022-11-05 ENCOUNTER — Ambulatory Visit (HOSPITAL_BASED_OUTPATIENT_CLINIC_OR_DEPARTMENT_OTHER): Payer: Medicare Other | Admitting: Anesthesiology

## 2022-11-05 ENCOUNTER — Ambulatory Visit (HOSPITAL_COMMUNITY)
Admission: RE | Admit: 2022-11-05 | Discharge: 2022-11-05 | Disposition: A | Discharge status: Home / Self Care | Payer: Medicare Other | Source: Ambulatory Visit | Attending: Internal Medicine | Admitting: Internal Medicine

## 2022-11-05 DIAGNOSIS — I495 Sick sinus syndrome: Secondary | ICD-10-CM | POA: Diagnosis not present

## 2022-11-05 DIAGNOSIS — I110 Hypertensive heart disease with heart failure: Secondary | ICD-10-CM | POA: Insufficient documentation

## 2022-11-05 DIAGNOSIS — E669 Obesity, unspecified: Secondary | ICD-10-CM | POA: Insufficient documentation

## 2022-11-05 DIAGNOSIS — Z6837 Body mass index (BMI) 37.0-37.9, adult: Secondary | ICD-10-CM | POA: Insufficient documentation

## 2022-11-05 DIAGNOSIS — Z79899 Other long term (current) drug therapy: Secondary | ICD-10-CM | POA: Insufficient documentation

## 2022-11-05 DIAGNOSIS — G4733 Obstructive sleep apnea (adult) (pediatric): Secondary | ICD-10-CM | POA: Insufficient documentation

## 2022-11-05 DIAGNOSIS — Z7984 Long term (current) use of oral hypoglycemic drugs: Secondary | ICD-10-CM | POA: Insufficient documentation

## 2022-11-05 DIAGNOSIS — I5032 Chronic diastolic (congestive) heart failure: Secondary | ICD-10-CM | POA: Insufficient documentation

## 2022-11-05 DIAGNOSIS — Z794 Long term (current) use of insulin: Secondary | ICD-10-CM | POA: Insufficient documentation

## 2022-11-05 DIAGNOSIS — I4891 Unspecified atrial fibrillation: Secondary | ICD-10-CM

## 2022-11-05 DIAGNOSIS — Z7901 Long term (current) use of anticoagulants: Secondary | ICD-10-CM | POA: Diagnosis not present

## 2022-11-05 DIAGNOSIS — J449 Chronic obstructive pulmonary disease, unspecified: Secondary | ICD-10-CM | POA: Diagnosis not present

## 2022-11-05 DIAGNOSIS — I4819 Other persistent atrial fibrillation: Secondary | ICD-10-CM | POA: Insufficient documentation

## 2022-11-05 DIAGNOSIS — F17210 Nicotine dependence, cigarettes, uncomplicated: Secondary | ICD-10-CM | POA: Insufficient documentation

## 2022-11-05 DIAGNOSIS — E1151 Type 2 diabetes mellitus with diabetic peripheral angiopathy without gangrene: Secondary | ICD-10-CM | POA: Diagnosis not present

## 2022-11-05 DIAGNOSIS — D6869 Other thrombophilia: Secondary | ICD-10-CM | POA: Insufficient documentation

## 2022-11-05 DIAGNOSIS — Z950 Presence of cardiac pacemaker: Secondary | ICD-10-CM | POA: Diagnosis not present

## 2022-11-05 HISTORY — PX: CARDIOVERSION: SHX1299

## 2022-11-05 LAB — GLUCOSE, CAPILLARY: Glucose-Capillary: 152 mg/dL — ABNORMAL HIGH (ref 70–99)

## 2022-11-05 SURGERY — CARDIOVERSION
Anesthesia: General

## 2022-11-05 MED ORDER — SODIUM CHLORIDE 0.9 % IV SOLN: INTRAVENOUS | Status: DC

## 2022-11-05 MED ORDER — LIDOCAINE HCL (CARDIAC) PF 100 MG/5ML IV SOSY
PREFILLED_SYRINGE | INTRAVENOUS | Status: DC | PRN
  Administered 2022-11-05: 50 mg via INTRAVENOUS

## 2022-11-05 MED ORDER — PROPOFOL 10 MG/ML IV BOLUS
INTRAVENOUS | Status: DC | PRN
  Administered 2022-11-05: 50 mg via INTRAVENOUS

## 2022-11-05 NOTE — Transfer of Care (Signed)
Immediate Anesthesia Transfer of Care Note  Patient: Steven Henry  Procedure(s) Performed: CARDIOVERSION  Patient Location: PACU and Endoscopy Unit  Anesthesia Type:General  Level of Consciousness: drowsy and patient cooperative  Airway & Oxygen Therapy: Patient Spontanous Breathing and Patient connected to nasal cannula oxygen  Post-op Assessment: Report given to RN and Post -op Vital signs reviewed and stable  Post vital signs: Reviewed and stable  Last Vitals:  Vitals Value Taken Time  BP    Temp    Pulse    Resp    SpO2      Last Pain:  Vitals:   11/05/22 0942  TempSrc: Temporal  PainSc: 0-No pain         Complications: No notable events documented.

## 2022-11-05 NOTE — Interval H&P Note (Signed)
History and Physical Interval Note:  11/05/2022 9:49 AM  Steven Henry  has presented today for surgery, with the diagnosis of AFIB.  The various methods of treatment have been discussed with the patient and family. After consideration of risks, benefits and other options for treatment, the patient has consented to  Procedure(s): CARDIOVERSION (N/A) as a surgical intervention.  The patient's history has been reviewed, patient examined, no change in status, stable for surgery.  I have reviewed the patient's chart and labs.  Questions were answered to the patient's satisfaction.     Janina Mayo

## 2022-11-05 NOTE — Anesthesia Postprocedure Evaluation (Signed)
Anesthesia Post Note  Patient: Steven Henry  Procedure(s) Performed: CARDIOVERSION     Patient location during evaluation: PACU Anesthesia Type: General Level of consciousness: awake and alert and oriented Pain management: pain level controlled Vital Signs Assessment: post-procedure vital signs reviewed and stable Respiratory status: spontaneous breathing, nonlabored ventilation and respiratory function stable Cardiovascular status: blood pressure returned to baseline and stable Postop Assessment: no apparent nausea or vomiting Anesthetic complications: no   No notable events documented.  Last Vitals:  Vitals:   11/05/22 1027 11/05/22 1030  BP: 96/82 100/81  Pulse: 60 61  Resp: 12 15  Temp: 36.6 C   SpO2: 95% 96%    Last Pain:  Vitals:   11/05/22 1030  TempSrc:   PainSc: 0-No pain                 [name]

## 2022-11-05 NOTE — Discharge Instructions (Signed)

## 2022-11-05 NOTE — Anesthesia Preprocedure Evaluation (Signed)
Anesthesia Evaluation  Patient identified by MRN, date of birth, ID band Patient awake    Reviewed: Allergy & Precautions, NPO status , Patient's Chart, lab work & pertinent test results, reviewed documented beta blocker date and time   Airway Mallampati: III  TM Distance: >3 FB Neck ROM: Limited    Dental  (+) Poor Dentition, Missing, Dental Advisory Given   Pulmonary sleep apnea and Continuous Positive Airway Pressure Ventilation , COPD,  COPD inhaler, Current Smoker and Patient abstained from smoking.   breath sounds clear to auscultation + decreased breath sounds      Cardiovascular hypertension, Pt. on medications and Pt. on home beta blockers + Peripheral Vascular Disease and +CHF  + dysrhythmias Atrial Fibrillation  Rhythm:Irregular Rate:Normal     Neuro/Psych negative neurological ROS  negative psych ROS   GI/Hepatic negative GI ROS, Neg liver ROS,,,  Endo/Other  diabetes, Well Controlled, Type 2, Oral Hypoglycemic Agents    Renal/GU Renal InsufficiencyRenal disease  negative genitourinary   Musculoskeletal negative musculoskeletal ROS (+)    Abdominal  (+) + obese  Peds  Hematology Eliquis therapy- last dose this am   Anesthesia Other Findings   Reproductive/Obstetrics                             Anesthesia Physical Anesthesia Plan  ASA: 2  Anesthesia Plan: General   Post-op Pain Management: Minimal or no pain anticipated   Induction: Intravenous  PONV Risk Score and Plan: 1 and Treatment may vary due to age or medical condition and Ondansetron  Airway Management Planned: Mask and Natural Airway  Additional Equipment: None  Intra-op Plan:   Post-operative Plan:   Informed Consent: I have reviewed the patients History and Physical, chart, labs and discussed the procedure including the risks, benefits and alternatives for the proposed anesthesia with the patient or  authorized representative who has indicated his/her understanding and acceptance.     Dental advisory given  Plan Discussed with: CRNA and Anesthesiologist  Anesthesia Plan Comments:        Anesthesia Quick Evaluation

## 2022-11-05 NOTE — CV Procedure (Signed)
Procedure: Electrical Cardioversion Indications:  Atrial Fibrillation  Procedure Details:  Consent: Risks of procedure as well as the alternatives and risks of each were explained to the (patient/caregiver).  Consent for procedure obtained.  Time Out: Verified patient identification, verified procedure, site/side was marked, verified correct patient position, special equipment/implants available, medications/allergies/relevent history reviewed, required imaging and test results available. PERFORMED.  Patient placed on cardiac monitor, pulse oximetry, supplemental oxygen as necessary.  Sedation given:  propofol Pacer pads placed anterior and posterior chest.  Cardioverted 1 time(s).  Cardioversion with synchronized biphasic 200J shock.  Evaluation: Findings: Post procedure EKG shows:  Atrial paced; mode switch feature for AP at 80 bpm for 5 minutes Complications: None Patient did tolerate procedure well.  Time Spent Directly with the Patient:  15 minutes   Janina Mayo 11/05/2022, 10:24 AM

## 2022-11-07 ENCOUNTER — Ambulatory Visit (HOSPITAL_COMMUNITY)
Admission: RE | Admit: 2022-11-07 | Discharge: 2022-11-07 | Disposition: A | Discharge status: Home / Self Care | Payer: Medicare Other | Source: Ambulatory Visit | Attending: Physician Assistant | Admitting: Physician Assistant

## 2022-11-07 ENCOUNTER — Encounter (HOSPITAL_COMMUNITY): Payer: Self-pay | Admitting: Physician Assistant

## 2022-11-07 VITALS — BP 120/80 | HR 76 | Ht 73.0 in | Wt 274.8 lb

## 2022-11-07 DIAGNOSIS — E118 Type 2 diabetes mellitus with unspecified complications: Secondary | ICD-10-CM | POA: Insufficient documentation

## 2022-11-07 DIAGNOSIS — I5032 Chronic diastolic (congestive) heart failure: Secondary | ICD-10-CM | POA: Diagnosis not present

## 2022-11-07 DIAGNOSIS — E1122 Type 2 diabetes mellitus with diabetic chronic kidney disease: Secondary | ICD-10-CM | POA: Diagnosis not present

## 2022-11-07 DIAGNOSIS — Z794 Long term (current) use of insulin: Secondary | ICD-10-CM | POA: Insufficient documentation

## 2022-11-07 DIAGNOSIS — Z87891 Personal history of nicotine dependence: Secondary | ICD-10-CM | POA: Insufficient documentation

## 2022-11-07 DIAGNOSIS — Z7984 Long term (current) use of oral hypoglycemic drugs: Secondary | ICD-10-CM | POA: Diagnosis not present

## 2022-11-07 DIAGNOSIS — J449 Chronic obstructive pulmonary disease, unspecified: Secondary | ICD-10-CM | POA: Insufficient documentation

## 2022-11-07 DIAGNOSIS — D6869 Other thrombophilia: Secondary | ICD-10-CM | POA: Diagnosis not present

## 2022-11-07 DIAGNOSIS — Z6836 Body mass index (BMI) 36.0-36.9, adult: Secondary | ICD-10-CM | POA: Diagnosis not present

## 2022-11-07 DIAGNOSIS — N183 Chronic kidney disease, stage 3 unspecified: Secondary | ICD-10-CM | POA: Insufficient documentation

## 2022-11-07 DIAGNOSIS — G4733 Obstructive sleep apnea (adult) (pediatric): Secondary | ICD-10-CM | POA: Diagnosis not present

## 2022-11-07 DIAGNOSIS — Z79899 Other long term (current) drug therapy: Secondary | ICD-10-CM | POA: Insufficient documentation

## 2022-11-07 DIAGNOSIS — Z7901 Long term (current) use of anticoagulants: Secondary | ICD-10-CM | POA: Diagnosis not present

## 2022-11-07 DIAGNOSIS — I130 Hypertensive heart and chronic kidney disease with heart failure and stage 1 through stage 4 chronic kidney disease, or unspecified chronic kidney disease: Secondary | ICD-10-CM | POA: Diagnosis not present

## 2022-11-07 DIAGNOSIS — I4819 Other persistent atrial fibrillation: Secondary | ICD-10-CM | POA: Insufficient documentation

## 2022-11-07 DIAGNOSIS — E669 Obesity, unspecified: Secondary | ICD-10-CM | POA: Diagnosis not present

## 2022-11-07 NOTE — Progress Notes (Signed)
Primary Care Physician: [name], MD Primary Cardiologist: Dr Dellia Cloud Primary Electrophysiologist: Dr Caryl Comes Referring Physician: Dr Oda Cogan is a 72 y.o. male with a history of HTN, COPD, OSA, DM, tobacco abuse, HFpEF, aortic dilatation, atrial fibrillation who presents for follow up in the [facility].  Pt was in [place] in 2023, and presented to local hospital with CHF and SOB, found to be in A-fib. Was diuresed and left AMA. In June 2023 presented to [facility] with increased abdominal swelling and orthopnea. Treated with IV Lasix, echo revealed EF 65 to 70%. Was sent home on p.o. Lasix and Eliquis. Patient is on Eliquis for a CHADS2VASC score of 5. He was noted to have a slow ventricular rate and a Zio monitor was placed which showed several pauses, up to 9.8 seconds. He underwent PPM insertion with Dr Caryl Comes on 09/14/22.   He was hospitalized again for decompensated CHF and possible COPD exacerbation, first at [facility] and then was readmitted the next day at [facility]. He did require BiPap for acute respiratory failure. He was discharged but his DCCV was postponed. He underwent DCCV on 11/05/22.  On follow up today, patient reports that he feels much improved today. He denies any fluid retention and his SOB is much better. He remains in AV dual paced rhythm. His weight is down several pounds since DCCV. No bleeding issues on anticoagulation.   Today, he denies symptoms of palpitations, chest pain, orthopnea, PND, lower extremity edema, dizziness, presyncope, syncope, snoring, daytime somnolence, bleeding, or neurologic sequela. The patient is tolerating medications without difficulties and is otherwise without complaint today.    Atrial Fibrillation Risk Factors:  he does have symptoms or diagnosis of sleep apnea. he is compliant with CPAP therapy. he does not have a history of rheumatic fever.   he has a BMI of Body mass  index is 36.26 kg/m.Marland Kitchen Filed Weights   11/07/22 0921  Weight: 124.6 kg    Family History  Problem Relation Age of Onset   Colon cancer Mother    Diabetes Mother    Heart disease Mother    Diabetes Sister    Diabetes Sister    Diabetes Sister      Atrial Fibrillation Management history:  Previous antiarrhythmic drugs: none Previous cardioversions: 11/05/22 Previous ablations: none CHADS2VASC score: 5 Anticoagulation history: Eliquis   Past Medical History:  Diagnosis Date   A-fib ([place])    CHF (congestive heart failure) (HCC)    CKD (chronic kidney disease), stage III (HCC)    Colon polyps    Diabetes mellitus type II, uncontrolled    Diabetes mellitus without complication ([place])    Hypertension    Morbid obesity ([place])    PVD (peripheral vascular disease) ([place])    right femoral artery   Sleep apnea    not on CPAP    Sleep apnea    Past Surgical History:  Procedure Laterality Date   COLONOSCOPY N/A 09/16/2015   Procedure: COLONOSCOPY;  Surgeon: Daneil Dolin, MD;  [facility];  Service: Endoscopy;  Laterality: N/A;  8:15 AM - pt has c pap   ENDOVENOUS ABLATION SAPHENOUS VEIN W/ LASER Right 08/26/2017   endovenous laser ablation right greater saphenous vein by Tinnie Gens MD    PACEMAKER IMPLANT N/A 09/14/2022   Procedure: PACEMAKER IMPLANT;  Surgeon: Deboraha Sprang, MD;  Location: [place] [facility];  Service: Cardiovascular;  Laterality: N/A;  stab phlebectomy  Right 01/07/2018   stab phlebectomy 10-20 incisions right leg by Tinnie Gens MD     Current Outpatient Medications  Medication Sig Dispense Refill   Accu-Chek FastClix Lancets MISC Check BS QAM - DX: E11.9 100 each 3   atorvastatin (LIPITOR) 40 MG tablet TAKE 1 TABLET BY MOUTH DAILY 90 tablet 3   bisoprolol (ZEBETA) 5 MG tablet Take 1 tablet (5 mg total) by mouth daily. 90 tablet 3   Blood Glucose Monitoring Suppl (ONE TOUCH ULTRA 2) w/Device KIT USETO CHECK BLOOD SUGAR EVERY MORNING 1  kit 0   budesonide-formoterol (SYMBICORT) 160-4.5 MCG/ACT inhaler Inhale 2 puffs into the lungs in the morning and at bedtime. 1 each 12   Cholecalciferol (VITAMIN D3) 10 MCG (400 UNIT) tablet Take 800 Units by mouth daily.     Continuous Blood Gluc Receiver (FREESTYLE LIBRE 2 READER) DEVI For continuous blood glucose monitoring 1 each 1   Continuous Blood Gluc Sensor (FREESTYLE LIBRE 2 SENSOR) MISC APPLY 1 SENSOR ON THE SKIN EVERY 14 DAYS, USE AS DIRECTED TO CHECK BLOOD SUGAR 2 each 3   ELIQUIS 5 MG TABS tablet TAKE 1 TABLET BY MOUTH TWICE DAILY 60 tablet 3   empagliflozin (JARDIANCE) 25 MG TABS tablet Take 1 tablet (25 mg total) by mouth daily before breakfast. 30 tablet 3   furosemide (LASIX) 40 MG tablet Take 2 tablets (80 mg total) by mouth 2 (two) times daily. 180 tablet 1   insulin glargine (LANTUS) 100 UNIT/ML injection Inject 0.25 mLs (25 Units total) into the skin daily. PLEASE INCREASE INSULIN BY 1 UNIT DAILY UNTIL FBG IS <130, THEN HOLD AT THAT DOSE (Patient taking differently: Inject 15 Units into the skin daily.) 30 mL 5   ipratropium-albuterol (DUONEB) 0.5-2.5 (3) MG/3ML SOLN Take 3 mLs by nebulization 3 (three) times daily. 360 mL 1   Lancets Misc. (ACCU-CHEK FASTCLIX LANCET) KIT Check BS QAM - DX: E11.9 1 kit 0   metFORMIN (GLUCOPHAGE) 1000 MG tablet TAKE 1 TABLET BY MOUTH TWICE DAILY WITH a meal 180 tablet 3   Multiple Vitamin (MULTIVITAMIN) capsule Take 1 capsule by mouth in the morning. Mens one a day     Multiple Vitamins-Minerals (ZINC PO) Take 30 mg by mouth at bedtime.     ONETOUCH ULTRA test strip USE TO CHECK BLOOD SUGAR EVERY MORNING 100 strip 12   Semaglutide,0.25 or 0.5MG/DOS, (OZEMPIC, 0.25 OR 0.5 MG/DOSE,) 2 MG/3ML SOPN Inject 0.5 mg into the skin every Thursday. 3 mL 0   spironolactone (ALDACTONE) 25 MG tablet Take 1 tablet (25 mg total) by mouth daily. 30 tablet 1   No current facility-administered medications for this encounter.    No Known Allergies  Social  History   Socioeconomic History   Marital status: Divorced    Spouse name: Not on file   Number of children: 2   Years of education: Not on file   Highest education level: Not on file  Occupational History   Occupation: Self Employed  Tobacco Use   Smoking status: Former    Packs/day: 1.50    Years: 30.00    Total pack years: 45.00    Types: Cigarettes    Start date: 04/04/1958   Smokeless tobacco: Never   Tobacco comments:    Quit smoking day of DCCV 11/07/22  Vaping Use   Vaping Use: Never used  Substance and Sexual Activity   Alcohol use: Yes    Alcohol/week: 1.0 standard drink of alcohol  Types: 1 Cans of beer per week    Comment: 1 beer once a month 10/12/22   Drug use: No   Sexual activity: Not on file    Comment: divorced, 2 daughters.  works Psychologist, occupational for fairs, Social research officer, government.  Other Topics Concern   Not on file  Social History Narrative   Not on file   Social Determinants of Health   Financial Resource Strain: Low Risk  (09/06/2022)   Overall Financial Resource Strain (CARDIA)    Difficulty of Paying Living Expenses: Not hard at all  Food Insecurity: No Food Insecurity (10/26/2022)   Hunger Vital Sign    Worried About Running Out of Food in the Last Year: Never true    [place] in the Last Year: Never true  Transportation Needs: No Transportation Needs (10/26/2022)   PRAPARE - Hydrologist (Medical): No    Lack of Transportation (Non-Medical): No  Physical Activity: Sufficiently Active (09/06/2022)   Exercise Vital Sign    Days of Exercise per Week: 5 days    Minutes of Exercise per Session: 30 min  Stress: No Stress Concern Present (09/06/2022)   Cope    Feeling of Stress : Not at all  Social Connections: Socially Isolated (09/06/2022)   Social Connection and Isolation Panel [NHANES]    Frequency of Communication with Friends and Family: More than  three times a week    Frequency of Social Gatherings with Friends and Family: Three times a week    Attends Religious Services: Never    Active Member of Clubs or Organizations: No    Attends Archivist Meetings: Never    Marital Status: Divorced  Human resources officer Violence: Not At Risk (10/26/2022)   Humiliation, Afraid, Rape, and Kick questionnaire    Fear of Current or Ex-Partner: No    Emotionally Abused: No    Physically Abused: No    Sexually Abused: No     ROS- All systems are reviewed and negative except as per the HPI above.  Physical Exam: Vitals:   11/07/22 0921  BP: 120/80  Pulse: 76  Weight: 124.6 kg  Height: 6' 1"$  (1.854 m)     GEN- The patient is a well appearing obese male, alert and oriented x 3 today.   HEENT-head normocephalic, atraumatic, sclera clear, conjunctiva pink, hearing intact, trachea midline. Lungs- Clear to ausculation bilaterally, normal work of breathing Heart- Regular rate and rhythm, no rubs or gallops, 2/6 systolic murmur  GI- soft, NT, ND, + BS Extremities- no clubbing, cyanosis, or edema MS- no significant deformity or atrophy Skin- no rash or lesion Psych- euthymic mood, full affect Neuro- strength and sensation are intact   Wt Readings from Last 3 Encounters:  11/07/22 124.6 kg  11/05/22 129.3 kg  11/01/22 130.2 kg    EKG today demonstrates  AV dual paced rhythm Vent. rate 76 BPM PR interval 116 ms QRS duration 122 ms QT/QTcB 342/384 ms  Echo 10/26/22 demonstrated   1. Left ventricular ejection fraction, by estimation, is 55 to 60%. The  left ventricle has normal function. The left ventricle has no regional  wall motion abnormalities. Left ventricular diastolic function could not  be evaluated.   2. Right ventricular systolic function is mildly reduced. The right  ventricular size is mildly enlarged. There is mildly elevated pulmonary  artery systolic pressure. The estimated right ventricular systolic  pressure  is [id number] mmHg.  3. Left atrial size was severely dilated.   4. Right atrial size was moderately dilated.   5. The mitral valve is grossly normal. No evidence of mitral valve  regurgitation. No evidence of mitral stenosis.   6. The aortic valve is calcified. There is moderate calcification of the  aortic valve. Aortic valve regurgitation is not visualized. Moderate to  severe aortic valve stenosis. Aortic valve area, by VTI measures 1.27 cm.  Aortic valve mean gradient  measures 34.5 mmHg. Aortic valve Vmax measures 4.15 m/s.   7. Aortic dilatation noted. There is borderline dilatation of the aortic  root, measuring 39 mm. There is mild dilatation of the ascending aorta,  measuring 40 mm.   8. The inferior vena cava is dilated in size with <50% respiratory  variability, suggesting right atrial pressure of 15 mmHg.   Comparison(s): No significant change from prior study.   Epic records are reviewed at length today  CHA2DS2-VASc Score = 5  The patient's score is based upon: CHF History: 1 HTN History: 1 Diabetes History: 1 Stroke History: 0 Vascular Disease History: 1 Age Score: 1 Gender Score: 0       ASSESSMENT AND PLAN: 1. Persistent Atrial Fibrillation (ICD10:  I48.19) The patient's CHA2DS2-VASc score is 5, indicating a 7.2% annual risk of stroke.   S/p DCCV 11/05/22 Patient back in [place]. With his severely dilated LA and valvular disease he may have recurrence and will likely need AAD or ablation.  Continue Eliquis 5 mg BID Continue bisoprolol 5 mg daily  2. Secondary Hypercoagulable State (ICD10:  D68.69) The patient is at significant risk for stroke/thromboembolism based upon his CHA2DS2-VASc Score of 5.  Continue Apixaban (Eliquis).   3. Obesity Body mass index is 36.26 kg/m. Lifestyle modification was discussed and encouraged including regular physical activity and weight reduction.  4. Obstructive sleep apnea Encouraged compliance with CPAP therapy.  5. Sinus  node dysfunction S/p PPM, followed by Dr Caryl Comes and the device clinic.  6. HTN Stable, no changes today.  7. Chronic HFpEF Patient has been hospitalized several times with acute CHF.  GDMT includes bisoprolol, spironolactone, Jardiance Fluid status appears stable today.   8. Aortic stenosis Echo 02/26/22 showed moderate sclerosis without stenosis Echo 10/26/22 showed moderate to severe aortic stenosis. ? If this could be contributing to his CHF and afib. Will request sooner visit with his primary cardiologist for evaluation.    Follow up with Dr Caryl Comes as scheduled and with Dr Dellia Cloud.    [facility] [address] [place], [place] [address] [phone] 11/07/2022 9:32 AM

## 2022-11-12 DIAGNOSIS — Z9981 Dependence on supplemental oxygen: Secondary | ICD-10-CM | POA: Diagnosis not present

## 2022-11-12 DIAGNOSIS — Z515 Encounter for palliative care: Secondary | ICD-10-CM | POA: Diagnosis not present

## 2022-11-12 DIAGNOSIS — Z720 Tobacco use: Secondary | ICD-10-CM | POA: Diagnosis not present

## 2022-11-12 DIAGNOSIS — R5383 Other fatigue: Secondary | ICD-10-CM | POA: Diagnosis not present

## 2022-11-12 DIAGNOSIS — I5032 Chronic diastolic (congestive) heart failure: Secondary | ICD-10-CM | POA: Diagnosis not present

## 2022-11-12 DIAGNOSIS — J449 Chronic obstructive pulmonary disease, unspecified: Secondary | ICD-10-CM | POA: Diagnosis not present

## 2022-11-12 DIAGNOSIS — J9610 Chronic respiratory failure, unspecified whether with hypoxia or hypercapnia: Secondary | ICD-10-CM | POA: Diagnosis not present

## 2022-11-12 DIAGNOSIS — F17210 Nicotine dependence, cigarettes, uncomplicated: Secondary | ICD-10-CM | POA: Diagnosis not present

## 2022-11-14 ENCOUNTER — Telehealth: Payer: Self-pay | Admitting: Pharmacist

## 2022-11-14 NOTE — Progress Notes (Signed)
Care Management & Coordination Services Pharmacy Team  Reason for Encounter: Appointment Reminder  Contacted patient to confirm in office appointment with Leata Mouse, PharmD on 11/16/2022 at 52 am. Spoke with patient on 11/14/2022   Do you have any problems getting your medications? No If yes what types of problems are you experiencing?  None  What is your top health concern you would like to discuss at your upcoming visit? His "breathing."  Have you seen any other providers since your last visit with PCP? No   Chart review:  Recent office visits:  11/01/2022 OV (PCP) [name], MD; Discontinue potassium. Patient is already on Jardiance. Recommended starting spironolactone 25 mg daily and recheck potassium in 2 weeks. May consider adding Entresto in the future   09/28/2022 OV (PCP) [name], MD; I will treat the patient for possible early cellulitis with Keflex 500 mg p.o. 3 times daily for 7 days.   08/22/2022 OV (PCP) [name], MD; if his weight goes up more than 2 to 3 pounds in a day we need to increase his Lasix back to 60 mg twice daily but I suspect that he does not need as much diuretic now that he is in normal sinus rhythm   Recent consult visits:  09/10/2022 OV (Cardiology) Finis Bud, NP; no medication changes indicated.  08/21/2022 OV (Cardiology) Finis Bud, NP; Will reduce Toprol XL to 12.5 mg daily.   07/30/2022 [facility] (Cardiology) Imogene Burn, PA-C; Increase lasix 80 mg am and 40 mg pm for 4 days then back to 40 mg bid. Increase K 10 meq 2 day.   06/15/2022 OV (Cardiology) Fay Records, MD; no medication changes indicated.  Hospital visits:  11/05/2022 Admission for Surgery CARDIOVERSION  10/26/2022 ED to Hospital Admission due to Physical deconditioning  -Patient will continue adjusted dose of Lasix (80 mg twice a day)  -patient will complete treatment with oral cefdinir.   10/22/2022 Admission due to Hypoxia START polymyxin B  sulf-trimethoprim 10,000 unit- 1 mg/mL Drop   Star Rating Drugs:  Atorvastatin 40 mg last filled 08/13/2022 90 DS Jardiance 25 mg last filled 10/03/2022 30 DS Giyxambi 25-5 mg last filled 08/01/2022 90 DS Glipizide 10 mg last filled 08/13/2022 90 DS Losartan 50 mg last filled 08/13/2022 90 DS Metformin 1000 mg last filled 08/13/2022 90 DS Ozempic last filled 10/09/2022 28 DS   Care Gaps: Annual wellness visit in last year? Yes  If Diabetic: Last eye exam / retinopathy screening: 11/29/2020 Last diabetic foot exam: 08/22/2022   Future Appointments  Date Time Provider [place]  11/16/2022 11:00 AM Edythe Clarity, [place] None  11/19/2022 10:00 AM Sherron Monday, NP [facility]  12/03/2022  2:30 PM Tanda Rockers, MD [facility] None  12/17/2022  2:20 PM Mallipeddi, Quenten Raven, MD [facility]  12/18/2022  7:00 AM [facility] DEVICE REMOTES [facility]  12/24/2022  2:45 PM Deboraha Sprang, MD [facility]  03/19/2023  7:00 AM [facility] DEVICE REMOTES [facility]  06/18/2023  7:00 AM [facility] DEVICE REMOTES [facility]  09/17/2023  7:00 AM [facility] DEVICE REMOTES [facility]  09/19/2023  2:00 PM [facility]-NURSE HEALTH ADVISOR [facility]  12/17/2023  7:00 AM [facility] DEVICE REMOTES [facility]  03/17/2024  7:00 AM [facility] DEVICE REMOTES [facility]  06/16/2024  7:00 AM [facility] DEVICE REMOTES [facility]  09/15/2024  7:00 AM [facility] DEVICE REMOTES [facility]   April D Calhoun, [place] Pharmacist Assistant [phone]

## 2022-11-16 ENCOUNTER — Ambulatory Visit: Payer: Medicare Other | Admitting: Pharmacist

## 2022-11-16 NOTE — Progress Notes (Signed)
Care Management & Coordination Services Pharmacy Note  11/16/2022 Name:  Steven Henry MRN:  [id number] DOB:  March 13, 1951  Summary: Initial visit with PharmD.  Counseled on proper maintenance inhaler use.  Dietary mods for DM.  Recommended visit for updated A1c in March.  He denies any concern with medication copays or access.  Recommendations/Changes made from today's visit: Recheck A1c  Follow up plan: Fu 6 months CMA to check in regularly on fluids and DM   Subjective: Steven Henry is an 72 y.o. year old male who is a primary patient of Pickard, Cammie Mcgee, MD.  The care coordination team was consulted for assistance with disease management and care coordination needs.    Engaged with patient face to face for initial visit.  Recent office visits:  11/01/2022 OV (PCP) [name], MD; Discontinue potassium. Patient is already on Jardiance. Recommended starting spironolactone 25 mg daily and recheck potassium in 2 weeks. May consider adding Entresto in the future    09/28/2022 OV (PCP) [name], MD; I will treat the patient for possible early cellulitis with Keflex 500 mg p.o. 3 times daily for 7 days.    08/22/2022 OV (PCP) [name], MD; if his weight goes up more than 2 to 3 pounds in a day we need to increase his Lasix back to 60 mg twice daily but I suspect that he does not need as much diuretic now that he is in normal sinus rhythm    Recent consult visits:  09/10/2022 OV (Cardiology) Finis Bud, NP; no medication changes indicated.   08/21/2022 OV (Cardiology) Finis Bud, NP; Will reduce Toprol XL to 12.5 mg daily.    07/30/2022 OV (Cardiology) Imogene Burn, PA-C; Increase lasix 80 mg am and 40 mg pm for 4 days then back to 40 mg bid. Increase K 10 meq 2 day.    06/15/2022 OV (Cardiology) Fay Records, MD; no medication changes indicated.   Hospital visits:  11/05/2022 Admission for Surgery CARDIOVERSION   10/26/2022 ED to Hospital  Admission due to Physical deconditioning  -Patient will continue adjusted dose of Lasix (80 mg twice a day)  -patient will complete treatment with oral cefdinir.    10/22/2022 Admission due to Hypoxia START polymyxin B sulf-trimethoprim 10,000 unit- 1 mg/mL Drop    Objective:  Lab Results  Component Value Date   CREATININE 0.90 11/01/2022   BUN 22 11/01/2022   EGFR 91 11/01/2022   GFRNONAA >60 10/29/2022   GFRAA 101 08/01/2020   NA 140 11/01/2022   K 3.9 11/01/2022   CALCIUM 9.8 11/01/2022   CO2 37 (H) 11/01/2022   GLUCOSE 261 (H) 11/01/2022    Lab Results  Component Value Date/Time   HGBA1C 8.1 (H) 08/22/2022 10:50 AM   HGBA1C 9.0 (H) 04/26/2022 04:07 PM   MICROALBUR 18.9 09/15/2021 08:19 AM   MICROALBUR 18.2 08/01/2020 08:44 AM    Last diabetic Eye exam: No results found for: "HMDIABEYEEXA"  Last diabetic Foot exam: No results found for: "HMDIABFOOTEX"   Lab Results  Component Value Date   CHOL 137 08/22/2022   HDL 52 08/22/2022   LDLCALC 66 08/22/2022   TRIG 105 08/22/2022   CHOLHDL 2.6 08/22/2022       Latest Ref Rng & Units 11/01/2022    2:32 PM 10/26/2022    5:34 AM 10/26/2022    1:39 AM  Hepatic Function  Total Protein 6.1 - 8.1 g/dL 7.1  7.9  8.1   Albumin 3.5 -  5.0 g/dL  3.4  3.4   AST 10 - 35 U/L '16  16  23   '$ ALT 9 - 46 U/L '16  15  16   '$ Alk Phosphatase 38 - 126 U/L  68  72   Total Bilirubin 0.2 - 1.2 mg/dL 0.7  0.9  0.6     Lab Results  Component Value Date/Time   TSH 0.902 10/26/2022 01:39 AM   TSH 1.990 02/26/2022 09:22 AM   TSH 2.712 01/08/2013 11:15 AM   TSH 2.657 01/08/2013 11:15 AM   FREET4 1.00 02/26/2022 09:22 AM   FREET4 1.07 01/08/2013 11:15 AM       Latest Ref Rng & Units 10/26/2022    5:34 AM 10/26/2022    1:39 AM 10/22/2022    8:47 AM  CBC  WBC 4.0 - 10.5 K/uL 13.7  13.3  10.5   Hemoglobin 13.0 - 17.0 g/dL 15.1  15.2  16.3   Hematocrit 39.0 - 52.0 % 48.2  48.6  48.1   Platelets 150 - 400 K/uL 206  215  195     Lab Results   Component Value Date/Time   VITAMINB12 689 02/26/2022 09:22 AM    Clinical ASCVD: Yes  The 10-year ASCVD risk score (Arnett DK, et al., 2019) is: 34.6%   Values used to calculate the score:     Age: 72 years     Sex: Male     Is Non-Hispanic African American: No     Diabetic: Yes     Tobacco smoker: Yes     Systolic Blood Pressure: 123456 mmHg     Is BP treated: Yes     HDL Cholesterol: 52 mg/dL     Total Cholesterol: 137 mg/dL        11/01/2022    4:37 PM 11/01/2022    1:56 PM 09/28/2022    3:07 PM  Depression screen PHQ 2/9  Decreased Interest 0 0 0  Down, Depressed, Hopeless 0 0 0  PHQ - 2 Score 0 0 0     Social History   Tobacco Use  Smoking Status Former   Packs/day: 1.50   Years: 30.00   Total pack years: 45.00   Types: Cigarettes   Start date: 04/04/1958  Smokeless Tobacco Never  Tobacco Comments   Quit smoking day of [facility] 11/07/22   BP Readings from Last 3 Encounters:  11/07/22 120/80  11/05/22 105/82  11/01/22 126/74   Pulse Readings from Last 3 Encounters:  11/07/22 76  11/05/22 62  11/01/22 82   Wt Readings from Last 3 Encounters:  11/07/22 274 lb 12.8 oz (124.6 kg)  11/05/22 285 lb (129.3 kg)  11/01/22 287 lb (130.2 kg)   BMI Readings from Last 3 Encounters:  11/07/22 36.26 kg/m  11/05/22 37.60 kg/m  11/01/22 37.87 kg/m    No Known Allergies  Medications Reviewed Today     Reviewed by Edythe Clarity, RPH (Pharmacist) on 11/16/22 at 93  Med List Status: <None>   Medication Order Taking? Sig Documenting Provider Last Dose Status Informant  Accu-Chek FastClix Lancets MISC [id number] Yes Check BS QAM - DX: E11.9 [name], MD Taking Active Family Member  atorvastatin (LIPITOR) 40 MG tablet [id number] Yes TAKE 1 TABLET BY MOUTH DAILY [name], MD Taking Active Family Member  bisoprolol (ZEBETA) 5 MG tablet [id number] Yes Take 1 tablet (5 mg total) by mouth daily. Duke, Tami Lin, PA Taking Active Family Member  Blood  Glucose Monitoring Suppl (ONE  TOUCH ULTRA 2) w/Device KIT [id number] Yes USETO CHECK BLOOD SUGAR EVERY MORNING [name], MD Taking Active Family Member  budesonide-formoterol [facility]) 160-4.5 MCG/ACT inhaler [id number] Yes Inhale 2 puffs into the lungs in the morning and at bedtime. Barton Dubois, MD Taking Active   Cholecalciferol (VITAMIN D3) 10 MCG (400 UNIT) tablet [id number] Yes Take 800 Units by mouth daily. [provider] Taking Active Family Member  Continuous Blood Gluc Receiver (FREESTYLE LIBRE 2 READER) DEVI [id number] Yes For continuous blood glucose monitoring Pickard, Cammie Mcgee, MD Taking Active Family Member  Continuous Blood Gluc Sensor (FREESTYLE LIBRE 2 SENSOR) MISC [id number] Yes APPLY 1 SENSOR ON THE SKIN EVERY 14 DAYS, USE AS DIRECTED TO CHECK BLOOD SUGAR [name], MD Taking Active Family Member  ELIQUIS 5 MG TABS tablet [id number] Yes TAKE 1 TABLET BY MOUTH TWICE DAILY [name], MD Taking Active Family Member  empagliflozin (JARDIANCE) 25 MG TABS tablet [id number] Yes Take 1 tablet (25 mg total) by mouth daily before breakfast. [name], MD Taking Active Family Member  furosemide (LASIX) 40 MG tablet [id number] Yes Take 2 tablets (80 mg total) by mouth 2 (two) times daily. Barton Dubois, MD Taking Active   insulin glargine (LANTUS) 100 UNIT/ML injection [id number] Yes Inject 0.25 mLs (25 Units total) into the skin daily. PLEASE INCREASE INSULIN BY 1 UNIT DAILY UNTIL FBG IS <130, THEN HOLD AT THAT DOSE  Patient taking differently: Inject 15 Units into the skin daily.   [name], MD Taking Active Family Member  ipratropium-albuterol (DUONEB) 0.5-2.5 (3) MG/3ML SOLN [id number] Yes Take 3 mLs by nebulization 3 (three) times daily. Murlean Iba, MD Taking Active Family Member           Med Note [address]   Fri Oct 26, 2022  1:06 PM)    Lancets Misc. (ACCU-CHEK FASTCLIX LANCET) KIT [id number] Yes Check BS QAM - DX: E11.9  [name], MD Taking Active Family Member  metFORMIN (GLUCOPHAGE) 1000 MG tablet [id number] Yes TAKE 1 TABLET BY MOUTH TWICE DAILY WITH a meal [name], MD Taking Active Family Member  Multiple Vitamin (MULTIVITAMIN) capsule [id number] Yes Take 1 capsule by mouth in the morning. Mens one a day [provider] Taking Active Family Member  Multiple Vitamins-Minerals (ZINC PO) [id number] Yes Take 30 mg by mouth at bedtime. [provider] Taking Active Family Member  [facility] ULTRA test strip [id number] Yes USE TO CHECK BLOOD SUGAR EVERY MORNING [name], MD Taking Active Family Member  Semaglutide,0.25 or 0.'5MG'$ /DOS, (OZEMPIC, 0.25 OR 0.5 MG/DOSE,) 2 MG/3ML SOPN [id number] Yes Inject 0.5 mg into the skin every Thursday. [name], MD Taking Active   spironolactone (ALDACTONE) 25 MG tablet [id number] Yes Take 1 tablet (25 mg total) by mouth daily. [name], MD Taking Active             SDOH:  (Social Determinants of Health) assessments and interventions performed: Yes Financial Resource Strain: Low Risk  (11/16/2022)   Overall Financial Resource Strain (CARDIA)    Difficulty of Paying Living Expenses: Not very hard   Food Insecurity: No Food Insecurity (11/16/2022)   Hunger Vital Sign    Worried About Running Out of Food in the Last Year: Never true    Ran Out of Food in the Last Year: Never true    SDOH Interventions    Flowsheet Row ED to Hosp-Admission (Discharged) from 10/26/2022 in [place]  from 09/06/2022 in [place]  SDOH Interventions    Food Insecurity Interventions -- Intervention Not Indicated  Housing Interventions Intervention Not Indicated Intervention Not Indicated  Transportation Interventions -- Intervention Not Indicated  Utilities Interventions -- Intervention Not Indicated  Alcohol Usage Interventions -- Intervention Not Indicated (Score <7)   Financial Strain Interventions -- Intervention Not Indicated  Physical Activity Interventions -- Intervention Not Indicated  Stress Interventions -- Intervention Not Indicated  Social Connections Interventions -- Intervention Not Indicated       Medication Assistance: None required.  Patient affirms current coverage meets needs.  Medication Access: Within the past 30 days, how often has patient missed a dose of medication? 0 Is a pillbox or other method used to improve adherence? Yes  Factors that may affect medication adherence? no barriers identified Are meds synced by current pharmacy? No  Are meds delivered by current pharmacy? No  Does patient experience delays in picking up medications due to transportation concerns? No   [facility] Services Reviewed: Is patient disadvantaged to use [facility]?: No  Current Rx insurance plan: [place] Name and location of Current pharmacy:  [place], [place] [id number] [place] [id number] Phone: [phone] Fax: [phone]  [facility] services reviewed with patient today?: Yes  Patient requests to transfer care to [facility] Pharmacy?: No  Reason patient declined to change pharmacies: Loyalty to other pharmacy/Patient preference  Compliance/Adherence/Medication fill history: Annual wellness visit in last year? Yes   If Diabetic: Last eye exam / retinopathy screening: 11/29/2020 Last diabetic foot exam: 08/22/2022   Assessment/Plan   Hypertension (BP goal <130/80) -Controlled -Current treatment: Bisoprolol '5mg'$  Appropriate, Effective, Safe, Accessible Spironolactone '25mg'$  Appropriate, Effective, Safe, Accessible -Medications previously tried: losartan, telmisartan/HCTZ  -Current home readings: not checking at home - most recent if office readings have been normal -Current dietary habits: eats about whatever he wants, see DM for specifics -Current exercise habits: minimal -Denies  hypotensive/hypertensive symptoms -Educated on BP goals and benefits of medications for prevention of heart attack, stroke and kidney damage; Exercise goal of 150 minutes per week; Importance of home blood pressure monitoring; -Counseled to monitor BP at home if symptomatic, document, and provide log at future appointments -Recommended to continue current medication  Hyperlipidemia: (LDL goal < 70) -Controlled, most recent LDL is 66 -Current treatment: Atorvastatin '40mg'$  Appropriate, Effective, Safe, Accessible -Medications previously tried: none noted   -Educated on Cholesterol goals;  Benefits of statin for ASCVD risk reduction; Importance of limiting foods high in cholesterol; -Recommended to continue current medication LDL controlled, adherent with med.  No need for changes, continue routine lipid screenings.  Diabetes (A1c goal <7%) -Uncontrolled -Current medications: Lantus 25 units daily AM Appropriate, Effective, Safe, Accessible Ozempic 0.'5mg'$  SQ once weekly Appropriate, Query effective, ,  Metformin '1000mg'$  BID Appropriate, Effective, Safe, Accessible Glipizide '10mg'$  daily Appropriate, Effective, Safe, Accessible Jardiance '25mg'$  Appropriate, Effective, Safe, Accessible -Medications previously tried: Glyxambi  -Current home glucose readings fasting glucose: 140-150 post prandial glucose: sometimes > 200 -Denies hypoglycemic/hyperglycemic symptoms -Current meal patterns:  breakfast: eggs, bacon, toast, grits  lunch: steak, pasta, sandwich  dinner: same as lunch snacks:  drinks: water, soda, unsweet tea -Current exercise: minimal -Educated on A1c and blood sugar goals; Complications of diabetes including kidney damage, retinal damage, and cardiovascular disease; Proper insulin injection technique; Prevention and management of hypoglycemic episodes; Benefits of routine self-monitoring of blood sugar; -Counseled to check feet daily and get yearly eye  exams -Recommended to  continue current medication -Has not had A1c since starting Ozempic.  Made appt for recheck in March.  Recommend he work on lowering the amount of carbs he is eating.  Could potentially increase Ozempic dose if still elevated.  Would also eventually like to see him d/c glipizide.  Atrial Fibrillation (Goal: prevent stroke and major bleeding) -Controlled -CHADSVASC: 5 -Current treatment: Rate control: Bisoprolol '5mg'$  Appropriate, Effective, Safe, Accessible  Anticoagulation: Elisuis '5mg'$  BID Appropriate, Effective, Safe, Accessible -Medications previously tried: none noted -Home BP and HR readings: normal  -Counseled on increased risk of stroke due to Afib and benefits of anticoagulation for stroke prevention; importance of adherence to anticoagulant exactly as prescribed; -Recommended to continue current medication -Evaluated stroke risk factors.  ASCVd 34.6% - on high intensity statin.  Could benefit from weight loss and is already taking Ozempic.  No changes at this time.  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%) -NYHA Class: I (no actitivty limitation) -AHA HF Stage: C (Heart disease and symptoms present) -Current treatment: Furosemide '80mg'$  BID Appropriate, Effective, Safe, Accessible Jardiance '25mg'$  Appropriate, Effective, Safe, Accessible Spironolactone '25mg'$  Appropriate, Effective, Safe, Accessible Bisoprolol '5mg'$  Appropriate, Effective, Safe, Accessible -Medications previously tried: none noted  -Current home BP/HR readings: normal -Current home daily weights: he is losing weight since Ozempic start -Current dietary habits: see DM -Current exercise habits: minimal -Educated on Benefits of medications for managing symptoms and prolonging life Importance of weighing daily; if you gain more than 3 pounds in one day or 5 pounds in one week, contact providers -Recommended to continue current  medication -Reinforced importance of fluid control - early notification to [place] if sees increase SOB or fatigue or visible swelling  COPD (Goal: control symptoms and prevent exacerbations) -Controlled -Current treatment  Symbicort 160-4.39mg 2 puffs BID Appropriate, Effective, Safe, Accessible Albuterol Nebs prn Appropriate, Effective, Safe, Accessible -Medications previously tried: none noted  - -Pulmonary function testing: Pulmonary Functions Testing Results:  No results found for: "FEV1", "FVC", "FEV1FVC", "TLC", "DLCO"  -Exacerbations requiring treatment in last 6 months: none -Patient denies consistent use of maintenance inhaler -Frequency of rescue inhaler use: inrequent -Counseled on Proper inhaler technique; Benefits of consistent maintenance inhaler use When to use rescue inhaler Differences between maintenance and rescue inhalers -Recommended to continue current medication -Was not using maintenance as directed, discussed proper daily use.  Patient agrees to try and only use albuterol prn.  CBeverly Milch PharmD, CPP Clinical Pharmacist Practitioner BBelle Fontaine([phone]

## 2022-11-18 NOTE — Progress Notes (Unsigned)
GI Office Note    Referring Provider: [name], MD Primary Care Physician:  [name], MD  Primary Gastroenterologist: Cristopher Estimable.Rourk, MD   Chief Complaint   No chief complaint on file.    History of Present Illness   Steven [facility] is a 72 y.o. male presenting today at the request of Pickard, Cammie Mcgee, MD for ***dysphagia.  Colonoscopy 09/16/15: -***  Today: Dysphagia -    Current Outpatient Medications  Medication Sig Dispense Refill   Accu-Chek FastClix Lancets MISC Check BS QAM - DX: E11.9 100 each 3   atorvastatin (LIPITOR) 40 MG tablet TAKE 1 TABLET BY MOUTH [facility] 90 tablet 3   bisoprolol (ZEBETA) 5 MG tablet Take 1 tablet (5 mg total) by mouth [facility]. 90 tablet 3   Blood Glucose Monitoring Suppl (ONE TOUCH ULTRA 2) w/Device KIT USETO CHECK BLOOD SUGAR EVERY MORNING 1 kit 0   budesonide-formoterol (SYMBICORT) 160-4.5 MCG/ACT inhaler Inhale 2 puffs into the lungs in the morning and at bedtime. 1 each 12   Cholecalciferol (VITAMIN D3) 10 MCG (400 UNIT) tablet Take 800 Units by mouth Henry.     Continuous Blood Gluc Receiver (FREESTYLE LIBRE 2 READER) DEVI For continuous blood glucose monitoring 1 each 1   Continuous Blood Gluc Sensor (FREESTYLE LIBRE 2 SENSOR) MISC APPLY 1 SENSOR ON THE SKIN EVERY 14 DAYS, USE AS DIRECTED TO CHECK BLOOD SUGAR 2 each 3   ELIQUIS 5 MG TABS tablet TAKE 1 TABLET BY MOUTH TWICE [facility] 60 tablet 3   empagliflozin (JARDIANCE) 25 MG TABS tablet Take 1 tablet (25 mg total) by mouth Henry before breakfast. 30 tablet 3   furosemide (LASIX) 40 MG tablet Take 2 tablets (80 mg total) by mouth 2 (two) times Henry. 180 tablet 1   insulin glargine (LANTUS) 100 UNIT/ML injection Inject 0.25 mLs (25 Units total) into the skin [facility]. PLEASE INCREASE INSULIN BY 1 UNIT [facility] UNTIL FBG IS <130, THEN HOLD AT THAT DOSE (Patient taking differently: Inject 15 Units into the skin Henry.) 30 mL 5   ipratropium-albuterol (DUONEB) 0.5-2.5 (3) MG/3ML  SOLN Take 3 mLs by nebulization 3 (three) times Henry. 360 mL 1   Lancets Misc. (ACCU-CHEK FASTCLIX LANCET) KIT Check BS QAM - DX: E11.9 1 kit 0   metFORMIN (GLUCOPHAGE) 1000 MG tablet TAKE 1 TABLET BY MOUTH TWICE [facility] WITH a meal 180 tablet 3   Multiple Vitamin (MULTIVITAMIN) capsule Take 1 capsule by mouth in the morning. Mens one a day     Multiple Vitamins-Minerals (ZINC PO) Take 30 mg by mouth at bedtime.     ONETOUCH ULTRA test strip USE TO CHECK BLOOD SUGAR EVERY MORNING 100 strip 12   Semaglutide,0.25 or 0.'5MG'$ /DOS, (OZEMPIC, 0.25 OR 0.5 MG/DOSE,) 2 MG/3ML SOPN Inject 0.5 mg into the skin every Thursday. 3 mL 0   spironolactone (ALDACTONE) 25 MG tablet Take 1 tablet (25 mg total) by mouth [facility]. 30 tablet 1   No current facility-administered medications for this visit.    Past Medical History:  Diagnosis Date   A-fib ([place])    CHF (congestive heart failure) (HCC)    CKD (chronic kidney disease), stage III (HCC)    Colon polyps    Diabetes mellitus type II, uncontrolled    Diabetes mellitus without complication ([place])    Hypertension    Morbid obesity ([place])    PVD (peripheral vascular disease) ([place])    right femoral artery   Sleep apnea    not  on CPAP    Sleep apnea     Past Surgical History:  Procedure Laterality Date   CARDIOVERSION N/A 11/05/2022   Procedure: CARDIOVERSION;  Surgeon: Janina Mayo, MD;  Location: [facility] ENDOSCOPY;  Service: Cardiovascular;  Laterality: N/A;   COLONOSCOPY N/A 09/16/2015   Procedure: COLONOSCOPY;  Surgeon: Daneil Dolin, MD;  Location: [facility] ENDO SUITE;  Service: Endoscopy;  Laterality: N/A;  8:15 AM - pt has c pap   ENDOVENOUS ABLATION SAPHENOUS VEIN W/ LASER Right 08/26/2017   endovenous laser ablation right greater saphenous vein by Tinnie Gens MD    PACEMAKER IMPLANT N/A 09/14/2022   Procedure: PACEMAKER IMPLANT;  Surgeon: Deboraha Sprang, MD;  Location: [facility];  Service: Cardiovascular;  Laterality: N/A;   stab phlebectomy   Right 01/07/2018   stab phlebectomy 10-20 incisions right leg by Tinnie Gens MD     Family History  Problem Relation Age of Onset   Colon cancer Mother    Diabetes Mother    Heart disease Mother    Diabetes Sister    Diabetes Sister    Diabetes Sister     Allergies as of 11/19/2022   (No Known Allergies)    Social History   Socioeconomic History   Marital status: Divorced    Spouse name: Not on file   Number of children: 2   Years of education: Not on file   Highest education level: Not on file  Occupational History   Occupation: Self Employed  Tobacco Use   Smoking status: Former    Packs/day: 1.50    Years: 30.00    Total pack years: 45.00    Types: Cigarettes    Start date: 04/04/1958   Smokeless tobacco: Never   Tobacco comments:    Quit smoking day of DCCV 11/07/22  Vaping Use   Vaping Use: Never used  Substance and Sexual Activity   Alcohol use: Yes    Alcohol/week: 1.0 standard drink of alcohol    Types: 1 Cans of beer per week    Comment: 1 beer once a month 10/12/22   Drug use: No   Sexual activity: Not on file    Comment: divorced, 2 daughters.  works Psychologist, occupational for fairs, Social research officer, government.  Other Topics Concern   Not on file  Social History Narrative   Not on file   Social Determinants of Health   Financial Resource Strain: Low Risk  (11/16/2022)   Overall Financial Resource Strain (CARDIA)    Difficulty of Paying Living Expenses: Not very hard  Food Insecurity: No Food Insecurity (11/16/2022)   Hunger Vital Sign    Worried About Running Out of Food in the Last Year: Never true    Ran Out of Food in the Last Year: Never true  Transportation Needs: No Transportation Needs (10/26/2022)   PRAPARE - Hydrologist (Medical): No    Lack of Transportation (Non-Medical): No  Physical Activity: Sufficiently Active (09/06/2022)   Exercise Vital Sign    Days of Exercise per Week: 5 days    Minutes of Exercise per Session: 30 min   Stress: No Stress Concern Present (09/06/2022)   [place]    Feeling of Stress : Not at all  Social Connections: Socially Isolated (09/06/2022)   Social Connection and Isolation Panel [NHANES]    Frequency of Communication with Friends and Family: More than three times a week    Frequency of  Social Gatherings with Friends and Family: Three times a week    Attends Religious Services: Never    Active Member of Clubs or Organizations: No    Attends Archivist Meetings: Never    Marital Status: Divorced  Human resources officer Violence: Not At Risk (10/26/2022)   Humiliation, Afraid, Rape, and Kick questionnaire    Fear of Current or Ex-Partner: No    Emotionally Abused: No    Physically Abused: No    Sexually Abused: No     Review of Systems   Gen: Denies any fever, chills, fatigue, weight loss, lack of appetite.  CV: Denies chest pain, heart palpitations, peripheral edema, syncope.  Resp: Denies shortness of breath at rest or with exertion. Denies wheezing or cough.  GI: see HPI GU : Denies urinary burning, urinary frequency, urinary hesitancy MS: Denies joint pain, muscle weakness, cramps, or limitation of movement.  Derm: Denies rash, itching, dry skin Psych: Denies depression, anxiety, memory loss, and confusion Heme: Denies bruising, bleeding, and enlarged lymph nodes.   Physical Exam   There were no vitals taken for this visit.  General:   Alert and oriented. Pleasant and cooperative. Well-nourished and well-developed.  Head:  Normocephalic and atraumatic. Eyes:  Without icterus, sclera clear and conjunctiva pink.  Ears:  Normal auditory acuity. Mouth:  No deformity or lesions, oral mucosa pink.  Lungs:  Clear to auscultation bilaterally. No wheezes, rales, or rhonchi. No distress.  Heart:  S1, S2 present without murmurs appreciated.  Abdomen:  +BS, soft, non-tender and non-distended. No HSM noted.  No guarding or rebound. No masses appreciated.  Rectal:  Deferred *** Msk:  Symmetrical without gross deformities. Normal posture. Extremities:  Without edema. Neurologic:  Alert and  oriented x4;  grossly normal neurologically. Skin:  Intact without significant lesions or rashes. Psych:  Alert and cooperative. Normal mood and affect.   Assessment   Steven [facility] is a 72 y.o. male with a history of afib, CHF, CKD stage 3, diabetes type 2, HTN, obesity, PVD, and OSA presenting today with dysphagia.   Dysphagia:    PLAN   ***    Venetia Night, MSN, FNP-BC, AGACNP-BC [facility]

## 2022-11-19 ENCOUNTER — Other Ambulatory Visit: Payer: Self-pay | Admitting: Family Medicine

## 2022-11-19 ENCOUNTER — Encounter: Payer: Self-pay | Admitting: Gastroenterology

## 2022-11-19 ENCOUNTER — Ambulatory Visit: Payer: Medicare Other | Admitting: Gastroenterology

## 2022-11-19 VITALS — BP 100/69 | HR 80 | Temp 96.9°F | Ht 73.0 in | Wt 274.8 lb

## 2022-11-19 DIAGNOSIS — R1310 Dysphagia, unspecified: Secondary | ICD-10-CM | POA: Diagnosis not present

## 2022-11-19 DIAGNOSIS — Z86010 Personal history of colonic polyps: Secondary | ICD-10-CM

## 2022-11-19 DIAGNOSIS — Z1211 Encounter for screening for malignant neoplasm of colon: Secondary | ICD-10-CM

## 2022-11-19 MED ORDER — ALBUTEROL SULFATE HFA 108 (90 BASE) MCG/ACT IN AERS: 2.0000 | INHALATION_SPRAY | Freq: Four times a day (QID) | RESPIRATORY_TRACT | 0 refills | Status: AC | PRN

## 2022-11-19 NOTE — Patient Instructions (Addendum)
Have ordered Cologuard for you. You should receive the sample in the mail and detailed instructions will be provided for you.  As we discussed, if positive colonoscopy is recommended and there is higher chance of being positive given your history.   Please let me know if your trouble swallowing occurs again and we can consider further evaluation. If this occurs remember to try small bites and alternate with sips of liquids.   Follow up in 1 year, sooner if needed.   It was a pleasure to see you today. I want to create trusting relationships with patients. If you receive a survey regarding your visit,  I greatly appreciate you taking time to fill this out on paper or through your MyChart. I value your feedback.  Venetia Night, MSN, FNP-BC, AGACNP-BC [facility]

## 2022-11-27 ENCOUNTER — Other Ambulatory Visit: Payer: Self-pay | Admitting: Family Medicine

## 2022-11-27 DIAGNOSIS — E1100 Type 2 diabetes mellitus with hyperosmolarity without nonketotic hyperglycemic-hyperosmolar coma (NKHHC): Secondary | ICD-10-CM

## 2022-11-27 DIAGNOSIS — N183 Chronic kidney disease, stage 3 unspecified: Secondary | ICD-10-CM

## 2022-11-29 ENCOUNTER — Ambulatory Visit: Payer: Medicare Other | Admitting: Internal Medicine

## 2022-11-29 ENCOUNTER — Encounter: Payer: Self-pay | Admitting: Internal Medicine

## 2022-11-29 VITALS — BP 138/86 | HR 84 | Ht 73.0 in | Wt 274.6 lb

## 2022-11-29 DIAGNOSIS — J441 Chronic obstructive pulmonary disease with (acute) exacerbation: Secondary | ICD-10-CM

## 2022-11-29 DIAGNOSIS — F17210 Nicotine dependence, cigarettes, uncomplicated: Secondary | ICD-10-CM | POA: Diagnosis not present

## 2022-11-29 NOTE — Assessment & Plan Note (Signed)
Counseled re importance of smoking cessation but did not meet time criteria for separate billing           Each maintenance medication was reviewed in detail including emphasizing most importantly the difference between maintenance and prns and under what circumstances the prns are to be triggered using an action plan format where appropriate.  Total time for H and P, chart review, counseling, reviewing hfa  device(s) and generating customized AVS unique to this office visit / same day charting = 30 min with pt new to me / last ov 2014

## 2022-11-29 NOTE — Patient Instructions (Addendum)
Continue symbicort but drop the dose to 2 puff each am  - if doing worse resume 2 twice daily    Please schedule a follow up visit in 3 months but call sooner if needed - bring inhalers  PFTs prior to return

## 2022-11-29 NOTE — Assessment & Plan Note (Addendum)
Active smoker - PFTs 04/20/2013  FEV1 2.45 (63%) ratio 73 with no better p B2, DLCO 75% corrects to 95% and minimal concavity   Cxr and echo support component of cardiac asthma > primary airways dz in this pt with mod AS but well compensated now that losing wt and back in NSR   Rec Change symbicort 160 to 2 puffs each am and pm doses prn  Rx valvular ht dz per cards  - The proper method of use, as well as anticipated side effects, of a metered-dose inhaler were discussed and demonstrated to the patient using teach back method.   F/u here with fresh pfts next available

## 2022-11-29 NOTE — Progress Notes (Signed)
Subjective:    Patient ID: Steven [facility], male    DOB: Feb 19, 1951, MRN: [id number]  HPI   51 yowm active smoker   referred by Dr Nadyne Coombes for eval of sob and cough  03/03/2013 1st pulmonary eval cc doe x several years x indolent onset to point where problem walking across a parking lot better p rx with "a pill" by Dr Jenkins [place] and now more limited by calf pain than sob but now bothered by cough on ACEi since 12/2012 indolent x 2 weeks, dry, day > night rec Stop lisinopril (zestril, prinivil) due to cough  Start micardis 80/12.5  One half daily and let your doctor know this when you get your blood work called in The key is to stop smoking completely before smoking completely stops you - it's not too late!   04/20/2013 f/u ov/Steven [facility] re GOLD 0 COPD Chief Complaint  Patient presents with   Follow-up with PFT    Pt states that he feels breathing has improved some since his last visit. Cough has resolved.    doe much better,  walking mall is good, hills are a problem or if he gets in a real hurry. Rec Continue micardis 80 /12.5 one half daily but call if your insurance prefers a different medication in the same category (ARB)    11/29/2022  re establish  ov/Steven [facility]/Steven [facility] re: GOLD 0 copd/ ? Cardiac asthma maint on sybmicot 160 2 bid  s/p cardioversion 3 weeks prior to OV  seemed to really help doe  Chief Complaint  Patient presents with   Consult    Consult COPD from cardiology  Dyspnea:  Not limited by breathing from desired activities   Cough: minimal white  Sleeping: flat bed 2 pillow  SABA use: ? On symbicort  02: 4lpm hs with cpap  Covid status: never vax / once infected  Lung cancer screening: declined for now    No obvious day to day or daytime variability or assoc excess/ purulent sputum or mucus plugs or hemoptysis or cp or chest tightness, subjective wheeze or overt sinus or hb symptoms.   Sleeping as above without nocturnal  or early am exacerbation  of respiratory  c/o's or  need for noct saba. Also denies any obvious fluctuation of symptoms with weather or environmental changes or other aggravating or alleviating factors except as outlined above   No unusual exposure hx or h/o childhood pna/ asthma or knowledge of premature birth.  Current Allergies, Complete Past Medical History, Past Surgical History, Family History, and Social History were reviewed in [employer] record.  ROS  The following are not active complaints unless bolded Hoarseness, sore throat, dysphagia, dental problems, itching, sneezing,  nasal congestion or discharge of excess mucus or purulent secretions, ear ache,   fever, chills, sweats, unintended wt loss or wt gain, classically pleuritic or exertional cp,  orthopnea pnd or arm/hand swelling  or leg swelling, presyncope, palpitations, abdominal pain, anorexia, nausea, vomiting, diarrhea  or change in bowel habits or change in bladder habits, change in stools or change in urine, dysuria, hematuria,  rash, arthralgias, visual complaints, headache, numbness, weakness or ataxia or problems with walking or coordination,  change in mood or  memory.        Current Meds  Medication Sig   Accu-Chek FastClix Lancets MISC Check BS QAM - DX: E11.9   albuterol (VENTOLIN HFA) 108 (90 Base) MCG/ACT inhaler Inhale 2 puffs into the lungs every 6 (six) hours as needed for  wheezing or shortness of breath.   atorvastatin (LIPITOR) 40 MG tablet TAKE 1 TABLET BY MOUTH DAILY   bisoprolol (ZEBETA) 5 MG tablet Take 1 tablet (5 mg total) by mouth daily.   Blood Glucose Monitoring Suppl (ONE TOUCH ULTRA 2) w/Device KIT USETO CHECK BLOOD SUGAR EVERY MORNING   budesonide-formoterol (SYMBICORT) 160-4.5 MCG/ACT inhaler Inhale 2 puffs into the lungs in the morning and at bedtime.   Cholecalciferol (VITAMIN D3) 10 MCG (400 UNIT) tablet Take 800 Units by mouth daily.   Continuous Blood Gluc Receiver (FREESTYLE LIBRE 2 READER) DEVI For continuous blood  glucose monitoring   Continuous Blood Gluc Sensor (FREESTYLE LIBRE 2 SENSOR) MISC APPLY 1 SENSOR ON THE SKIN EVERY 14 DAYS, USE AS DIRECTED TO CHECK BLOOD SUGAR   ELIQUIS 5 MG TABS tablet TAKE 1 TABLET BY MOUTH TWICE DAILY   empagliflozin (JARDIANCE) 25 MG TABS tablet Take 1 tablet (25 mg total) by mouth daily before breakfast.   furosemide (LASIX) 40 MG tablet Take 2 tablets (80 mg total) by mouth 2 (two) times daily.   insulin glargine (LANTUS) 100 UNIT/ML injection Inject 0.25 mLs (25 Units total) into the skin daily. PLEASE INCREASE INSULIN BY 1 UNIT DAILY UNTIL FBG IS <130, THEN HOLD AT THAT DOSE (Patient taking differently: Inject 15 Units into the skin daily.)   ipratropium-albuterol (DUONEB) 0.5-2.5 (3) MG/3ML SOLN Take 3 mLs by nebulization 3 (three) times daily.   Lancets Misc. (ACCU-CHEK FASTCLIX LANCET) KIT Check BS QAM - DX: E11.9   metFORMIN (GLUCOPHAGE) 1000 MG tablet TAKE 1 TABLET BY MOUTH TWICE DAILY WITH a meal   Multiple Vitamin (MULTIVITAMIN) capsule Take 1 capsule by mouth in the morning. Mens one a day   Multiple Vitamins-Minerals (ZINC PO) Take 30 mg by mouth at bedtime.   ONETOUCH ULTRA test strip USE TO CHECK BLOOD SUGAR EVERY MORNING   OZEMPIC, 0.25 OR 0.5 MG/DOSE, 2 MG/3ML SOPN INJECT 0.'5mg'$  into THE SKIN ONCE WEEKLY   spironolactone (ALDACTONE) 25 MG tablet Take 1 tablet (25 mg total) by mouth daily.              Objective:   Physical Exam  11/29/2022          274  04/20/2013        342     03/03/13 344 lb (156.037 kg)  02/18/13 327 lb (148.326 kg)  01/08/13 352 lb (159.666 kg)    Vital signs reviewed  11/29/2022  - Note at rest 02 sats  94% on RA   General appearance:    amb mod obese wm/ gruff voice/ nad   HEENT : Oropharynx  clear        NECK :  without  apparent JVD/ palpable Nodes/TM    LUNGS: no acc muscle use,  Nl contour chest which is clear to A and P bilaterally without cough on insp or exp maneuvers   CV:  RRR  no s3  2/6 SEM s  increase in  P2, and no edema   ABD:  obese soft and nontender with nl inspiratory excursion in the supine position. No bruits or organomegaly appreciated   MS:  Nl gait/ ext warm without deformities Or obvious joint restrictions  calf tenderness, cyanosis or clubbing    SKIN: warm and dry without lesions    NEURO:  alert, approp, nl sensorium with  no motor or cerebellar deficits apparent.       I personally reviewed images and agree with radiology impression as follows:  CXR:   portable 10/26/22  Cardiomegaly and interstitial edema similar to prior.     Assessment & Plan:

## 2022-12-01 ENCOUNTER — Encounter: Payer: Self-pay | Admitting: Internal Medicine

## 2022-12-03 ENCOUNTER — Institutional Professional Consult (permissible substitution): Payer: Medicare Other | Admitting: Internal Medicine

## 2022-12-07 ENCOUNTER — Ambulatory Visit (INDEPENDENT_AMBULATORY_CARE_PROVIDER_SITE_OTHER): Payer: Medicare Other | Admitting: Family Medicine

## 2022-12-07 ENCOUNTER — Encounter: Payer: Self-pay | Admitting: Family Medicine

## 2022-12-07 VITALS — BP 124/68 | HR 88 | Ht 73.0 in | Wt 280.0 lb

## 2022-12-07 DIAGNOSIS — N183 Chronic kidney disease, stage 3 unspecified: Secondary | ICD-10-CM | POA: Diagnosis not present

## 2022-12-07 DIAGNOSIS — E1100 Type 2 diabetes mellitus with hyperosmolarity without nonketotic hyperglycemic-hyperosmolar coma (NKHHC): Secondary | ICD-10-CM

## 2022-12-07 DIAGNOSIS — I5032 Chronic diastolic (congestive) heart failure: Secondary | ICD-10-CM | POA: Diagnosis not present

## 2022-12-07 DIAGNOSIS — J449 Chronic obstructive pulmonary disease, unspecified: Secondary | ICD-10-CM

## 2022-12-07 NOTE — Progress Notes (Signed)
Subjective:   11/01/22 Patient was recently admitted to the hospital with acute respiratory failure and hypercapnia.  He was hypersomnolent and confused on admission with a venous blood gas pCO2 greater than 100.  He underwent IV diuresis with Lasix and was discharged with Lasix 80 mg twice a day.  He is here today for follow-up.  He was also suspected to have COPD contributing to his hypercapnia and was started on Symbicort.  He is feeling some better today however on his exam he has bibasilar crackles and pitting edema in his lower extremity suggesting that he continue diuresis.  Patient clearly has congestive heart failure with preserved ejection fraction.  Echocardiogram showed an ejection fraction of 60%.  He did have right ventricular dilatation and increased pulmonary artery pressure.  Chest x-ray showed vascular congestion throughout and cardiomegaly.  At that time, my plan was: Patient acute continues to appear fluid overloaded on exam.  Continue Lasix 80 mg twice daily.  Discontinue potassium.  Patient is already on Jardiance.  Recommended starting spironolactone 25 mg daily and recheck potassium in 2 weeks.  Will optimize therapy for congestive heart failure with preserved ejection fraction.  May consider adding Entresto in the future.  Recommended compliance with CPAP for his obstructive sleep apnea as well as smoking cessation.  Also recommended treatment for possible underlying COPD.  Continue Symbicort at the present time.  Has an appointment to see pulmonology upcoming pulmonary function test.  Recheck patient in 2 weeks or sooner if worsening  12/07/22 Wt Readings from Last 3 Encounters:  12/07/22 280 lb (127 kg)  11/29/22 274 lb 9.6 oz (124.6 kg)  11/19/22 274 lb 12.8 oz (124.6 kg)   Despite his elevated weight gain, he is being in so-called [place] and his weight stays consistently between 274 and 275.  He is taking his furosemide and spironolactone as prescribed.  On his exam today  there is no pitting edema in his extremities and his lungs sound relatively clear.  Unfortunately he continues to smoke.  He is using his Symbicort once a day.  I reviewed his pulmonologist note and they mention taking this once a day.  Respectfully I asked him to go ahead and take it twice a day given his history of COPD exacerbations.  He denies any chest pain.  He denies any shortness of breath.  He is checking his sugars randomly.  His fasting blood sugars have been between 101 160.  He occasionally sees it as low as 87.  His random 2-hour postprandial sugars are typically below 200.  He denies any hypoglycemic episodes. Past Medical History:  Diagnosis Date   A-fib (HCC)    CHF (congestive heart failure) (HCC)    CKD (chronic kidney disease), stage III (HCC)    Colon polyps    Diabetes mellitus type II, uncontrolled    Diabetes mellitus without complication ([place])    Hypertension    Morbid obesity ([place])    PVD (peripheral vascular disease) ([place])    right femoral artery   Sleep apnea    not on CPAP    Sleep apnea    Past Surgical History:  Procedure Laterality Date   CARDIOVERSION N/A 11/05/2022   Procedure: CARDIOVERSION;  Surgeon: Janina Mayo, MD;  Location: [facility] ENDOSCOPY;  Service: Cardiovascular;  Laterality: N/A;   COLONOSCOPY N/A 09/16/2015   Procedure: COLONOSCOPY;  Surgeon: Daneil Dolin, MD;  Location: [facility] ENDO SUITE;  Service: Endoscopy;  Laterality: N/A;  8:15 AM - pt has  c pap   ENDOVENOUS ABLATION SAPHENOUS VEIN W/ LASER Right 08/26/2017   endovenous laser ablation right greater saphenous vein by Tinnie Gens MD    PACEMAKER IMPLANT N/A 09/14/2022   Procedure: PACEMAKER IMPLANT;  Surgeon: Deboraha Sprang, MD;  Location: [facility];  Service: Cardiovascular;  Laterality: N/A;   stab phlebectomy  Right 01/07/2018   stab phlebectomy 10-20 incisions right leg by Tinnie Gens MD    Current Outpatient Medications on File Prior to Visit  Medication Sig Dispense Refill    Accu-Chek FastClix Lancets MISC Check BS QAM - DX: E11.9 100 each 3   albuterol (VENTOLIN HFA) 108 (90 Base) MCG/ACT inhaler Inhale 2 puffs into the lungs every 6 (six) hours as needed for wheezing or shortness of breath. 8 g 0   atorvastatin (LIPITOR) 40 MG tablet TAKE 1 TABLET BY MOUTH DAILY 90 tablet 3   bisoprolol (ZEBETA) 5 MG tablet Take 1 tablet (5 mg total) by mouth daily. 90 tablet 3   Blood Glucose Monitoring Suppl (ONE TOUCH ULTRA 2) w/Device KIT USETO CHECK BLOOD SUGAR EVERY MORNING 1 kit 0   budesonide-formoterol (SYMBICORT) 160-4.5 MCG/ACT inhaler Inhale 2 puffs into the lungs in the morning and at bedtime. 1 each 12   Cholecalciferol (VITAMIN D3) 10 MCG (400 UNIT) tablet Take 800 Units by mouth daily.     Continuous Blood Gluc Receiver (FREESTYLE LIBRE 2 READER) DEVI For continuous blood glucose monitoring 1 each 1   Continuous Blood Gluc Sensor (FREESTYLE LIBRE 2 SENSOR) MISC APPLY 1 SENSOR ON THE SKIN EVERY 14 DAYS, USE AS DIRECTED TO CHECK BLOOD SUGAR 2 each 3   ELIQUIS 5 MG TABS tablet TAKE 1 TABLET BY MOUTH TWICE DAILY 60 tablet 3   empagliflozin (JARDIANCE) 25 MG TABS tablet Take 1 tablet (25 mg total) by mouth daily before breakfast. 30 tablet 3   furosemide (LASIX) 40 MG tablet Take 2 tablets (80 mg total) by mouth 2 (two) times daily. 180 tablet 1   insulin glargine (LANTUS) 100 UNIT/ML injection Inject 0.25 mLs (25 Units total) into the skin daily. PLEASE INCREASE INSULIN BY 1 UNIT DAILY UNTIL FBG IS <130, THEN HOLD AT THAT DOSE (Patient taking differently: Inject 15 Units into the skin daily.) 30 mL 5   ipratropium-albuterol (DUONEB) 0.5-2.5 (3) MG/3ML SOLN Take 3 mLs by nebulization 3 (three) times daily. 360 mL 1   Lancets Misc. (ACCU-CHEK FASTCLIX LANCET) KIT Check BS QAM - DX: E11.9 1 kit 0   metFORMIN (GLUCOPHAGE) 1000 MG tablet TAKE 1 TABLET BY MOUTH TWICE DAILY WITH a meal 180 tablet 3   Multiple Vitamin (MULTIVITAMIN) capsule Take 1 capsule by mouth in the  morning. Mens one a day     Multiple Vitamins-Minerals (ZINC PO) Take 30 mg by mouth at bedtime.     ONETOUCH ULTRA test strip USE TO CHECK BLOOD SUGAR EVERY MORNING 100 strip 12   OZEMPIC, 0.25 OR 0.5 MG/DOSE, 2 MG/3ML SOPN INJECT 0.5mg  into THE SKIN ONCE WEEKLY 3 mL 0   spironolactone (ALDACTONE) 25 MG tablet Take 1 tablet (25 mg total) by mouth daily. 30 tablet 1   No current facility-administered medications on file prior to visit.   No Known Allergies Social History   Socioeconomic History   Marital status: Divorced    Spouse name: Not on file   Number of children: 2   Years of education: Not on file   Highest education level: Not on file  Occupational History  Occupation: Self Employed  Tobacco Use   Smoking status: Former    Packs/day: 1.50    Years: 30.00    Additional pack years: 0.00    Total pack years: 45.00    Types: Cigarettes    Start date: 04/04/1958   Smokeless tobacco: Never   Tobacco comments:    Quit smoking day of D[facility] 11/07/22  Vaping Use   Vaping Use: Never used  Substance and Sexual Activity   Alcohol use: Yes    Alcohol/week: 1.0 standard drink of alcohol    Types: 1 Cans of beer per week    Comment: 1 beer once a month 10/12/22   Drug use: No   Sexual activity: Not on file    Comment: divorced, 2 daughters.  works Psychologist, occupational for fairs, Social research officer, government.  Other Topics Concern   Not on file  Social History Narrative   Not on file   Social Determinants of Health   Financial Resource Strain: Low Risk  (11/16/2022)   Overall Financial Resource Strain (CARDIA)    Difficulty of Paying Living Expenses: Not very hard  Food Insecurity: No Food Insecurity (11/16/2022)   Hunger Vital Sign    Worried About Running Out of Food in the Last Year: Never true    Ran Out of Food in the Last Year: Never true  Transportation Needs: No Transportation Needs (10/26/2022)   PRAPARE - Hydrologist (Medical): No    Lack of Transportation  (Non-Medical): No  Physical Activity: Sufficiently Active (09/06/2022)   Exercise Vital Sign    Days of Exercise per Week: 5 days    Minutes of Exercise per Session: 30 min  Stress: No Stress Concern Present (09/06/2022)   [place]    Feeling of Stress : Not at all  Social Connections: Socially Isolated (09/06/2022)   Social Connection and Isolation Panel [NHANES]    Frequency of Communication with Friends and Family: More than three times a week    Frequency of Social Gatherings with Friends and Family: Three times a week    Attends Religious Services: Never    Active Member of Clubs or Organizations: No    Attends Archivist Meetings: Never    Marital Status: Divorced  Human resources officer Violence: Not At Risk (10/26/2022)   Humiliation, Afraid, Rape, and Kick questionnaire    Fear of Current or Ex-Partner: No    Emotionally Abused: No    Physically Abused: No    Sexually Abused: No     Review of Systems  All other systems reviewed and are negative.      Objective:   Physical Exam Vitals reviewed.  Constitutional:      General: He is not in acute distress.    Appearance: Normal appearance. He is obese. He is not ill-appearing or toxic-appearing.  HENT:     Head: Normocephalic and atraumatic.     Right Ear: Tympanic membrane and ear canal normal.     Left Ear: Tympanic membrane and ear canal normal.     Nose: Nose normal. No congestion or rhinorrhea.     Mouth/Throat:     Mouth: Mucous membranes are moist.     Pharynx: Oropharynx is clear. No oropharyngeal exudate or posterior oropharyngeal erythema.  Eyes:     General: No scleral icterus.       Right eye: No discharge.        Left eye: No discharge.  Extraocular Movements: Extraocular movements intact.     Conjunctiva/sclera: Conjunctivae normal.     Pupils: Pupils are equal, round, and reactive to light.  Neck:     Vascular: No carotid  bruit.  Cardiovascular:     Rate and Rhythm: Normal rate. Rhythm irregular.     Pulses: Normal pulses.     Heart sounds: Normal heart sounds. No murmur heard.    No friction rub. No gallop.  Pulmonary:     Effort: Pulmonary effort is normal. No respiratory distress.     Breath sounds: No stridor. No wheezing, rhonchi or rales.  Chest:     Chest wall: No tenderness.  Abdominal:     General: Abdomen is flat. Bowel sounds are normal. There is no distension.     Palpations: Abdomen is soft. There is no mass.     Tenderness: There is no abdominal tenderness. There is no guarding or rebound.     Hernia: No hernia is present.  Musculoskeletal:     Cervical back: Normal range of motion and neck supple.     Right lower leg: No edema.     Left lower leg: No edema.  Lymphadenopathy:     Cervical: No cervical adenopathy.  Skin:    Coloration: Skin is not jaundiced.     Findings: No bruising, erythema, lesion or rash.  Neurological:     General: No focal deficit present.     Mental Status: He is alert and oriented to person, place, and time.     Cranial Nerves: No cranial nerve deficit.     Sensory: No sensory deficit.     Motor: No weakness.     Coordination: Coordination normal.     Gait: Gait normal.     Deep Tendon Reflexes: Reflexes normal.  Psychiatric:        Mood and Affect: Mood normal.        Behavior: Behavior normal.        Thought Content: Thought content normal.        Judgment: Judgment normal.       Assessment & Pl  Uncontrolled type 2 diabetes mellitus with hyperosmolarity without coma, without long-term current use of insulin (HCC) - Plan: Hemoglobin A1c, CBC with Differential/Platelet, COMPLETE METABOLIC PANEL WITH GFR, Lipid panel  Stage 3 chronic kidney disease, unspecified whether stage 3a or 3b CKD ([place]) - Plan: Hemoglobin A1c, CBC with Differential/Platelet, COMPLETE METABOLIC PANEL WITH GFR, Lipid panel  Chronic obstructive pulmonary disease, unspecified  COPD type (HCC)  Chronic diastolic congestive heart failure ([place]) - Plan: Hemoglobin A1c, CBC with Differential/Platelet, COMPLETE METABOLIC PANEL WITH GFR, Lipid panel Patient is in normal sinus rhythm today and he appears euvolemic.  Therefore I will continue furosemide and spironolactone at his present dose.  I will check potassium level to ensure that we have not created any electrolyte disturbances with these 2 medications.  I have asked the patient to weigh himself daily and start furosemide if his weight goes up 3 or more pounds in a day.  I would take his spironolactone twice a day given its half-life and duration of action.  With regards to congestive heart failure with preserved ejection fraction, he is currently on Jardiance.  He is also on spironolactone.  At the present time I will not add Entresto.  Instead I recommended smoking cessation.  Check hemoglobin A1c today.  Plan to uptitrate his insulin until his fasting blood sugars are under 130 consistently.

## 2022-12-08 LAB — COMPLETE METABOLIC PANEL WITH GFR
AG Ratio: 1.3 (calc) (ref 1.0–2.5)
ALT: 17 U/L (ref 9–46)
AST: 20 U/L (ref 10–35)
Albumin: 4.2 g/dL (ref 3.6–5.1)
Alkaline phosphatase (APISO): 59 U/L (ref 35–144)
BUN: 15 mg/dL (ref 7–25)
CO2: 30 mmol/L (ref 20–32)
Calcium: 9.4 mg/dL (ref 8.6–10.3)
Chloride: 100 mmol/L (ref 98–110)
Creat: 0.93 mg/dL (ref 0.70–1.28)
Globulin: 3.2 g/dL (calc) (ref 1.9–3.7)
Glucose, Bld: 177 mg/dL — ABNORMAL HIGH (ref 65–99)
Potassium: 4 mmol/L (ref 3.5–5.3)
Sodium: 141 mmol/L (ref 135–146)
Total Bilirubin: 0.6 mg/dL (ref 0.2–1.2)
Total Protein: 7.4 g/dL (ref 6.1–8.1)
eGFR: 88 mL/min/{1.73_m2} (ref >=60)

## 2022-12-08 LAB — LIPID PANEL
Cholesterol: 146 mg/dL (ref <200)
HDL: 59 mg/dL (ref >=40)
LDL Cholesterol (Calc): 71 mg/dL (calc)
Non-HDL Cholesterol (Calc): 87 mg/dL (calc) (ref <130)
Total CHOL/HDL Ratio: 2.5 (calc) (ref <5.0)
Triglycerides: 77 mg/dL (ref <150)

## 2022-12-08 LAB — CBC WITH DIFFERENTIAL/PLATELET
Absolute Monocytes: 899 cells/uL (ref 200–950)
Basophils Absolute: 61 cells/uL (ref 0–200)
Basophils Relative: 0.6 %
Eosinophils Absolute: 121 cells/uL (ref 15–500)
Eosinophils Relative: 1.2 %
HCT: 49.1 % (ref 38.5–50.0)
Hemoglobin: 16.4 g/dL (ref 13.2–17.1)
Lymphs Abs: 1747 cells/uL (ref 850–3900)
MCH: 31.4 pg (ref 27.0–33.0)
MCHC: 33.4 g/dL (ref 32.0–36.0)
MCV: 94.1 fL (ref 80.0–100.0)
MPV: 11.7 fL (ref 7.5–12.5)
Monocytes Relative: 8.9 %
Neutro Abs: 7272 cells/uL (ref 1500–7800)
Neutrophils Relative %: 72 %
Platelets: 232 10*3/uL (ref 140–400)
RBC: 5.22 10*6/uL (ref 4.20–5.80)
RDW: 13.6 % (ref 11.0–15.0)
Total Lymphocyte: 17.3 %
WBC: 10.1 10*3/uL (ref 3.8–10.8)

## 2022-12-08 LAB — HEMOGLOBIN A1C
Hgb A1c MFr Bld: 8.4 % of total Hgb — ABNORMAL HIGH (ref <5.7)
Mean Plasma Glucose: 194 mg/dL
eAG (mmol/L): 10.8 mmol/L

## 2022-12-11 ENCOUNTER — Other Ambulatory Visit: Payer: Self-pay

## 2022-12-11 ENCOUNTER — Telehealth: Payer: Self-pay

## 2022-12-11 DIAGNOSIS — R195 Other fecal abnormalities: Secondary | ICD-10-CM

## 2022-12-11 DIAGNOSIS — E1165 Type 2 diabetes mellitus with hyperglycemia: Secondary | ICD-10-CM

## 2022-12-11 LAB — COLOGUARD: COLOGUARD: POSITIVE — AB

## 2022-12-11 MED ORDER — SEMAGLUTIDE (1 MG/DOSE) 4 MG/3ML ~~LOC~~ SOPN: 1.0000 mg | PEN_INJECTOR | SUBCUTANEOUS | 1 refills | Status: DC

## 2022-12-11 NOTE — Telephone Encounter (Signed)
Pt called stating that he needs a refill on his Ozempic. He is currently on 0.5 mg. Last RF 11/27/2022. Okay to go ahead and increase to 1 mg? Thank you.  Asking because I saw in the note pt is supposed to up titrate his insulin, just wanted to make sure okay to increase Ozempic also.

## 2022-12-12 ENCOUNTER — Telehealth (INDEPENDENT_AMBULATORY_CARE_PROVIDER_SITE_OTHER): Payer: Self-pay | Admitting: *Deleted

## 2022-12-12 NOTE — Telephone Encounter (Signed)
  What type of surgery is being performed? Colonoscopy  When is surgery scheduled? TBD  Clearance to hold Eliquis x 2 days prior  Name of physician performing surgery?  Dr. [facility] Phone: [phone] Fax: [phone]  Anethesia type (none, local, MAC, general)? MAC

## 2022-12-13 ENCOUNTER — Encounter: Payer: Self-pay | Admitting: *Deleted

## 2022-12-13 ENCOUNTER — Other Ambulatory Visit: Payer: Self-pay | Admitting: *Deleted

## 2022-12-13 MED ORDER — PEG 3350-KCL-NA BICARB-NACL 420 G PO SOLR: 4000.0000 mL | Freq: Once | ORAL | 0 refills | Status: AC

## 2022-12-13 NOTE — Telephone Encounter (Signed)
Called pt, LMOVM to call back   Sherron Monday, NP 12/12/2022 11:09 AM EDT     Okay to schedule for colonoscopy with Dr. Gala Romney.  ASA 3   Will need clearance to hold Eliquis for 2 days Hold Jardiance for 3 days prior Half normal dose of Lantus the night prior to procedure Hold metformin the night prior to and morning of procedure Hold Ozempic for 1 week prior (procedure day 8 or after) gastric

## 2022-12-13 NOTE — Telephone Encounter (Signed)
Pt has been scheduled for 44/24/24. Instructions mailed and prep sent to the pharmacy.

## 2022-12-17 ENCOUNTER — Encounter: Payer: Self-pay | Admitting: Internal Medicine

## 2022-12-17 ENCOUNTER — Other Ambulatory Visit: Payer: Self-pay | Admitting: *Deleted

## 2022-12-17 ENCOUNTER — Ambulatory Visit: Payer: Medicare Other | Attending: Internal Medicine | Admitting: Internal Medicine

## 2022-12-17 VITALS — BP 108/66 | HR 68 | Ht 73.0 in | Wt 282.8 lb

## 2022-12-17 DIAGNOSIS — I350 Nonrheumatic aortic (valve) stenosis: Secondary | ICD-10-CM | POA: Insufficient documentation

## 2022-12-17 DIAGNOSIS — I4819 Other persistent atrial fibrillation: Secondary | ICD-10-CM | POA: Diagnosis not present

## 2022-12-17 DIAGNOSIS — Z950 Presence of cardiac pacemaker: Secondary | ICD-10-CM

## 2022-12-17 NOTE — Patient Instructions (Addendum)
Medication Instructions:  Your physician recommends that you continue on your current medications as directed. Please refer to the Current Medication list given to you today.  Labwork: none  Testing/Procedures: Your physician has requested that you have an echocardiogram in 6 months just before your next visit in August 2024. Echocardiography is a painless test that uses sound waves to create images of your heart. It provides your doctor with information about the size and shape of your heart and how well your heart's chambers and valves are working. This procedure takes approximately one hour. There are no restrictions for this procedure. Please do NOT wear cologne, perfume, aftershave, or lotions (deodorant is allowed). Please arrive 15 minutes prior to your appointment time. Aortic Valve Calcium Score CT scan   Follow-Up: Your physician recommends that you schedule a follow-up appointment in: 6 months  Any Other Special Instructions Will Be Listed Below (If Applicable).  If you need a refill on your cardiac medications before your next appointment, please call your pharmacy.

## 2022-12-17 NOTE — Progress Notes (Signed)
Cardiology Office Note  Date: 12/17/2022   ID: Steven Henry, DOB 11-Aug-1951, MRN [id number]  PCP:  [name], MD  Cardiologist:  None Electrophysiologist:  None   Reason for Office Visit: Follow-up of A-fib and aortic valve stenosis   History of Present Illness: Steven Henry is a 72 y.o. male known to have A-fib s/p PPM in 08/2022 (due to pauses or high-grade AV block), s/p DCCV in 10/2022, HTN, DM 2, HLD, OSA not on CPAP, moderate to severe aortic valve stenosis per 02/24 echocardiogram presented to the cardiology clinic for follow-up visit.  Patient had multiple hospitalizations for ADHF and eventually underwent PPM implantation in 08/2022 due to pauses on the event monitor.  He underwent DCCV in 10/2022 and has been in NSR. His left atrium was severely dilated per echocardiogram.  Echocardiogram from 02/2022 showed moderate aortic valve stenosis with aortic valve mean gradient of 23.5 mmHg, valve area 1.45 cm. Repeat echocardiogram from 10/2022 showed moderate to severe aortic valve stenosis with aortic valve V-max 4.15 m/s, aortic valve mean gradient 34.5 mmHg and aortic valve area by VTI 1.27 cm. He presents today for follow-up visit.  He denies any DOE, angina, dizziness, lightheadedness, syncope, leg swelling and palpitations.  Past Medical History:  Diagnosis Date   A-fib (HCC)    CHF (congestive heart failure) (HCC)    CKD (chronic kidney disease), stage III (HCC)    Colon polyps    Diabetes mellitus type II, uncontrolled    Diabetes mellitus without complication ([place])    Hypertension    Morbid obesity ([place])    PVD (peripheral vascular disease) ([place])    right femoral artery   Sleep apnea    not on CPAP    Sleep apnea     Past Surgical History:  Procedure Laterality Date   CARDIOVERSION N/A 11/05/2022   Procedure: CARDIOVERSION;  Surgeon: Janina Mayo, MD;  Location: [facility] ENDOSCOPY;  Service: Cardiovascular;  Laterality: N/A;   COLONOSCOPY N/A 09/16/2015    Procedure: COLONOSCOPY;  Surgeon: Daneil Dolin, MD;  Location: [facility] ENDO SUITE;  Service: Endoscopy;  Laterality: N/A;  8:15 AM - pt has c pap   ENDOVENOUS ABLATION SAPHENOUS VEIN W/ LASER Right 08/26/2017   endovenous laser ablation right greater saphenous vein by Tinnie Gens MD    PACEMAKER IMPLANT N/A 09/14/2022   Procedure: PACEMAKER IMPLANT;  Surgeon: Deboraha Sprang, MD;  Location: [facility] CV LAB;  Service: Cardiovascular;  Laterality: N/A;   stab phlebectomy  Right 01/07/2018   stab phlebectomy 10-20 incisions right leg by Tinnie Gens MD     Current Outpatient Medications  Medication Sig Dispense Refill   Semaglutide, 1 MG/DOSE, 4 MG/3ML SOPN Inject 1 mg as directed once a week. 3 mL 1   Accu-Chek FastClix Lancets MISC Check BS QAM - DX: E11.9 100 each 3   albuterol (VENTOLIN HFA) 108 (90 Base) MCG/ACT inhaler Inhale 2 puffs into the lungs every 6 (six) hours as needed for wheezing or shortness of breath. 8 g 0   atorvastatin (LIPITOR) 40 MG tablet TAKE 1 TABLET BY MOUTH DAILY 90 tablet 3   bisoprolol (ZEBETA) 5 MG tablet Take 1 tablet (5 mg total) by mouth daily. 90 tablet 3   Blood Glucose Monitoring Suppl (ONE TOUCH ULTRA 2) w/Device KIT USETO CHECK BLOOD SUGAR EVERY MORNING 1 kit 0   budesonide-formoterol (SYMBICORT) 160-4.5 MCG/ACT inhaler Inhale 2 puffs into the lungs in the morning and at bedtime. 1 each  12   Cholecalciferol (VITAMIN D3) 10 MCG (400 UNIT) tablet Take 800 Units by mouth daily.     Continuous Blood Gluc Receiver (FREESTYLE LIBRE 2 READER) DEVI For continuous blood glucose monitoring 1 each 1   Continuous Blood Gluc Sensor (FREESTYLE LIBRE 2 SENSOR) MISC APPLY 1 SENSOR ON THE SKIN EVERY 14 DAYS, USE AS DIRECTED TO CHECK BLOOD SUGAR 2 each 3   ELIQUIS 5 MG TABS tablet TAKE 1 TABLET BY MOUTH TWICE DAILY 60 tablet 3   empagliflozin (JARDIANCE) 25 MG TABS tablet Take 1 tablet (25 mg total) by mouth daily before breakfast. 30 tablet 3   furosemide (LASIX) 40 MG  tablet Take 2 tablets (80 mg total) by mouth 2 (two) times daily. 180 tablet 1   insulin glargine (LANTUS) 100 UNIT/ML injection Inject 0.25 mLs (25 Units total) into the skin daily. PLEASE INCREASE INSULIN BY 1 UNIT DAILY UNTIL FBG IS <130, THEN HOLD AT THAT DOSE (Patient taking differently: Inject 15 Units into the skin daily.) 30 mL 5   ipratropium-albuterol (DUONEB) 0.5-2.5 (3) MG/3ML SOLN Take 3 mLs by nebulization 3 (three) times daily. 360 mL 1   Lancets Misc. (ACCU-CHEK FASTCLIX LANCET) KIT Check BS QAM - DX: E11.9 1 kit 0   metFORMIN (GLUCOPHAGE) 1000 MG tablet TAKE 1 TABLET BY MOUTH TWICE DAILY WITH a meal 180 tablet 3   Multiple Vitamin (MULTIVITAMIN) capsule Take 1 capsule by mouth in the morning. Mens one a day     Multiple Vitamins-Minerals (ZINC PO) Take 30 mg by mouth at bedtime.     ONETOUCH ULTRA test strip USE TO CHECK BLOOD SUGAR EVERY MORNING 100 strip 12   spironolactone (ALDACTONE) 25 MG tablet Take 1 tablet (25 mg total) by mouth daily. 30 tablet 1   No current facility-administered medications for this visit.   Allergies:  Patient has no known allergies.   Social History: The patient  reports that he has quit smoking. His smoking use included cigarettes. He started smoking about 64 years ago. He has a 45.00 pack-year smoking history. He has never used smokeless tobacco. He reports current alcohol use of about 1.0 standard drink of alcohol per week. He reports that he does not use drugs.   Family History: The patient's family history includes Colon cancer in his mother; Diabetes in his mother, sister, sister, and sister; Heart disease in his mother.   ROS:  Please see the history of present illness. Otherwise, complete review of systems is positive for none.  All other systems are reviewed and negative.   Physical Exam: VS:  There were no vitals taken for this visit., BMI There is no height or weight on file to calculate BMI.  Wt Readings from Last 3 Encounters:   12/07/22 280 lb (127 kg)  11/29/22 274 lb 9.6 oz (124.6 kg)  11/19/22 274 lb 12.8 oz (124.6 kg)    General: Patient appears comfortable at rest. HEENT: Conjunctiva and lids normal, oropharynx clear with moist mucosa. Neck: Supple, no elevated JVP or carotid bruits, no thyromegaly. Lungs: Clear to auscultation, nonlabored breathing at rest. Cardiac: Regular rate and rhythm, ejection systolic murmur with no S2 Abdomen: Soft, nontender, no hepatomegaly, bowel sounds present, no guarding or rebound. Extremities: No pitting edema, distal pulses 2+. Skin: Warm and dry. Musculoskeletal: No kyphosis. Neuropsychiatric: Alert and oriented x3, affect grossly appropriate.  ECG: V paced rhythm and underlying atrial fibrillation  Recent Labwork: 10/26/2022: Magnesium 2.5; TSH 0.902 11/01/2022: Brain Natriuretic Peptide 42 12/07/2022: ALT 17;  AST 20; BUN 15; Creat 0.93; Hemoglobin 16.4; Platelets 232; Potassium 4.0; Sodium 141     Component Value Date/Time   CHOL 146 12/07/2022 0814   TRIG 77 12/07/2022 0814   HDL 59 12/07/2022 0814   CHOLHDL 2.5 12/07/2022 0814   VLDL 8 02/27/2022 0424   LDLCALC 71 12/07/2022 0814    Other Studies Reviewed Today: Echocardiogram from 10/2022 LVEF normal Moderate to severe aortic valve stenosis with aortic valve V-max 4.15 m/s, aortic valve mean gradient 34.5 mmHg and aortic valve area by VTI 1.27 cm  Assessment and Plan: Patient is a 72 year old M known to have A-fib s/p PPM in 08/2022 (due to pauses or high-grade AV block), s/p DCCV in 10/2022, HTN, DM 2, HLD, OSA not on CPAP, moderate to severe aortic valve stenosis per 02/24 echocardiogram presented to the cardiology clinic for follow-up visit.  # A-fib s/p PPM in 08/2022 (due to pauses or high-grade AV block) # A-fib s/p DCCV in 10/2022, currently in A-fib rate controlled -EKG today showed V paced rhythm and underlying atrial fibrillation -Continue bisoprolol 5 mg once daily -Continue Eliquis 5 mg twice  daily -Continue CPAP therapy for OSA management  # Moderate to severe aortic valve stenosis -Echocardiogram from 02/2022 showed moderate aortic valve stenosis with aortic valve mean gradient of 23.5 mmHg, valve area 1.45 cm. Repeat echocardiogram from 10/2022 showed moderate to severe aortic valve stenosis with aortic valve V-max 4.15 m/s, aortic valve mean gradient 34.5 mmHg and aortic valve area by VTI 1.27 cm.  DVI is 0.26. Physical examination today is remarkable for ejection systolic murmur with absent S2. No symptoms of chest pain, DOE, dizziness/lightheadedness and syncope since DCCV in 10/2022. Echo from 10/2022 showed aortic valve V-max 4.15 m/s with absent S2 on physical exam consistent with severe aortic valve stenosis but DVI is 0.26 indicating moderate aortic valve stenosis. Will obtain CT calcium scoring of aortic valve and place referral to structural heart cardiology. He will benefit from enrollment in the progress trial (if moderate). Tentatively, will place for Echo order in 6 months.  # Chronic diastolic heart failure -Continue p.o. Lasix 80 mg twice daily -Continue Jardiance 25 mg once daily (dose for diabetes) -Continue CPAP  # OSA -Continue CPAP  I have spent a total of 33 minutes with patient reviewing chart, EKGs, labs and examining patient as well as establishing an assessment and plan that was discussed with the patient.  > 50% of time was spent in direct patient care.    Medication Adjustments/Labs and Tests Ordered: Current medicines are reviewed at length with the patient today.  Concerns regarding medicines are outlined above.   Tests Ordered: Orders Placed This Encounter  Procedures   CT CARDIAC SCORING (SELF PAY ONLY)   EKG 12-Lead   EKG 12-Lead   ECHOCARDIOGRAM COMPLETE    Medication Changes: No orders of the defined types were placed in this encounter.   Disposition:  Follow up  6 months  Signed Shakaria Raphael Fidel Levy, MD, 12/17/2022 2:11 PM    [facility], [place], [place] [address]

## 2022-12-17 NOTE — Progress Notes (Signed)
Per Mallipeddi-Could you place referral to structural heart clinic for evaluation of moderate to severe AS

## 2022-12-18 ENCOUNTER — Ambulatory Visit (INDEPENDENT_AMBULATORY_CARE_PROVIDER_SITE_OTHER): Payer: Medicare Other

## 2022-12-18 ENCOUNTER — Other Ambulatory Visit: Payer: Self-pay | Admitting: Family Medicine

## 2022-12-18 DIAGNOSIS — I4891 Unspecified atrial fibrillation: Secondary | ICD-10-CM | POA: Diagnosis not present

## 2022-12-18 LAB — CUP PACEART REMOTE DEVICE CHECK
Battery Remaining Longevity: 161 mo
Battery Voltage: 3.2 V
Brady Statistic RA Percent Paced: 0.04 %
Brady Statistic RV Percent Paced: 82.35 %
Date Time Interrogation Session: 20240325211211
Implantable Lead Connection Status: 753985
Implantable Lead Connection Status: 753985
Implantable Lead Implant Date: 20231222
Implantable Lead Implant Date: 20231222
Implantable Lead Location: 753859
Implantable Lead Location: 753860
Implantable Lead Model: 3830
Implantable Lead Model: 5076
Implantable Pulse Generator Implant Date: 20231222
Lead Channel Impedance Value: 361 Ohm
Lead Channel Impedance Value: 418 Ohm
Lead Channel Impedance Value: 437 Ohm
Lead Channel Impedance Value: 570 Ohm
Lead Channel Pacing Threshold Amplitude: 0.625 V
Lead Channel Pacing Threshold Amplitude: 0.75 V
Lead Channel Pacing Threshold Pulse Width: 0.4 ms
Lead Channel Pacing Threshold Pulse Width: 0.4 ms
Lead Channel Sensing Intrinsic Amplitude: 16.5 mV
Lead Channel Sensing Intrinsic Amplitude: 16.5 mV
Lead Channel Sensing Intrinsic Amplitude: 3.875 mV
Lead Channel Sensing Intrinsic Amplitude: 3.875 mV
Lead Channel Setting Pacing Amplitude: 2 V
Lead Channel Setting Pacing Amplitude: 3 V
Lead Channel Setting Pacing Pulse Width: 0.4 ms
Lead Channel Setting Sensing Sensitivity: 0.9 mV
Zone Setting Status: 755011

## 2022-12-20 ENCOUNTER — Telehealth: Payer: Self-pay

## 2022-12-20 ENCOUNTER — Other Ambulatory Visit: Payer: Self-pay

## 2022-12-20 DIAGNOSIS — E1100 Type 2 diabetes mellitus with hyperosmolarity without nonketotic hyperglycemic-hyperosmolar coma (NKHHC): Secondary | ICD-10-CM

## 2022-12-20 DIAGNOSIS — I5032 Chronic diastolic (congestive) heart failure: Secondary | ICD-10-CM

## 2022-12-20 MED ORDER — SEMAGLUTIDE (2 MG/DOSE) 8 MG/3ML ~~LOC~~ SOPN: 2.0000 mg | PEN_INJECTOR | SUBCUTANEOUS | 3 refills | Status: DC

## 2022-12-20 NOTE — Telephone Encounter (Signed)
Pt is currently on Ozempic 1 mg per week per patient's medication list. Do you want it increased to 2 mg? Thank you.    [name], MD  Phill Mutter, NP; Chriss Driver, LPN Phone Number: [phone]   Sure, be glad to.   MJ please have patient increase Ozempic to 1 mg sq weekly.       Previous Messages    ----- Message ----- From: Phill Mutter, NP Sent: 12/19/2022  11:23 AM EDT To: [name], MD Subject: lantus and ozempic                            I saw Steven Henry today. He did not indicate that he was increasing his Lantus by 1 unit daily per your request.  He states that once his blood sugars are between 100-115, he feels hypoglycemic. Since that is a barrier for him, would you consider increasing his Ozempic to 1mg  weekly instead of the Lantus? Thank you, [name]

## 2022-12-24 ENCOUNTER — Ambulatory Visit: Payer: Medicare Other | Attending: Internal Medicine | Admitting: Internal Medicine

## 2022-12-24 ENCOUNTER — Encounter: Payer: Self-pay | Admitting: Internal Medicine

## 2022-12-24 ENCOUNTER — Other Ambulatory Visit: Payer: Self-pay | Admitting: Internal Medicine

## 2022-12-24 ENCOUNTER — Other Ambulatory Visit: Payer: Self-pay | Admitting: Family Medicine

## 2022-12-24 VITALS — BP 112/68 | HR 68 | Ht 73.0 in | Wt 282.0 lb

## 2022-12-24 DIAGNOSIS — I480 Paroxysmal atrial fibrillation: Secondary | ICD-10-CM | POA: Diagnosis not present

## 2022-12-24 DIAGNOSIS — I5032 Chronic diastolic (congestive) heart failure: Secondary | ICD-10-CM

## 2022-12-24 DIAGNOSIS — E1100 Type 2 diabetes mellitus with hyperosmolarity without nonketotic hyperglycemic-hyperosmolar coma (NKHHC): Secondary | ICD-10-CM

## 2022-12-24 DIAGNOSIS — N183 Chronic kidney disease, stage 3 unspecified: Secondary | ICD-10-CM

## 2022-12-24 DIAGNOSIS — Z950 Presence of cardiac pacemaker: Secondary | ICD-10-CM

## 2022-12-24 DIAGNOSIS — R001 Bradycardia, unspecified: Secondary | ICD-10-CM | POA: Diagnosis not present

## 2022-12-24 LAB — CUP PACEART INCLINIC DEVICE CHECK
Battery Remaining Longevity: 161 mo
Battery Voltage: 3.2 V
Brady Statistic AP VP Percent: 22.88 %
Brady Statistic AP VS Percent: 9.46 %
Brady Statistic AS VP Percent: 33.52 %
Brady Statistic AS VS Percent: 33.92 %
Brady Statistic RA Percent Paced: 7.93 %
Brady Statistic RV Percent Paced: 76.85 %
Date Time Interrogation Session: 20240401172115
Implantable Lead Connection Status: 753985
Implantable Lead Connection Status: 753985
Implantable Lead Implant Date: 20231222
Implantable Lead Implant Date: 20231222
Implantable Lead Location: 753859
Implantable Lead Location: 753860
Implantable Lead Model: 3830
Implantable Lead Model: 5076
Implantable Pulse Generator Implant Date: 20231222
Lead Channel Impedance Value: 399 Ohm
Lead Channel Impedance Value: 437 Ohm
Lead Channel Impedance Value: 456 Ohm
Lead Channel Impedance Value: 589 Ohm
Lead Channel Pacing Threshold Amplitude: 0 V
Lead Channel Pacing Threshold Amplitude: 0.625 V
Lead Channel Pacing Threshold Amplitude: 0.625 V
Lead Channel Pacing Threshold Amplitude: 1 V
Lead Channel Pacing Threshold Pulse Width: 0 ms
Lead Channel Pacing Threshold Pulse Width: 0.4 ms
Lead Channel Pacing Threshold Pulse Width: 0.4 ms
Lead Channel Pacing Threshold Pulse Width: 0.4 ms
Lead Channel Sensing Intrinsic Amplitude: 16.125 mV
Lead Channel Sensing Intrinsic Amplitude: 17.25 mV
Lead Channel Sensing Intrinsic Amplitude: 2.75 mV
Lead Channel Sensing Intrinsic Amplitude: 4.75 mV
Lead Channel Setting Pacing Amplitude: 2 V
Lead Channel Setting Pacing Amplitude: 3 V
Lead Channel Setting Pacing Pulse Width: 0.4 ms
Lead Channel Setting Sensing Sensitivity: 0.9 mV
Zone Setting Status: 755011

## 2022-12-24 MED ORDER — FLECAINIDE ACETATE 100 MG PO TABS: 100.0000 mg | ORAL_TABLET | Freq: Two times a day (BID) | ORAL | 3 refills | Status: DC

## 2022-12-24 NOTE — Patient Instructions (Addendum)
Medication Instructions:  Your physician has recommended you make the following change in your medication:   ** Flecainide 100mg  - 1 tablet by mouth twice daily  *If you need a refill on your cardiac medications before your next appointment, please call your pharmacy*   Lab Work: CBC and BMET If you have labs (blood work) drawn today and your tests are completely normal, you will receive your results only by: [place] (if you have MyChart) OR A paper copy in the mail If you have any lab test that is abnormal or we need to change your treatment, we will call you to review the results.   Testing/Procedures: Your physician has recommended that you have a Cardioversion (DCCV). Electrical Cardioversion uses a jolt of electricity to your heart either through paddles or wired patches attached to your chest. This is a controlled, usually prescheduled, procedure. Defibrillation is done under light anesthesia in the hospital, and you usually go home the day of the procedure. This is done to get your heart back into a normal rhythm. You are not awake for the procedure. Please see the instruction sheet given to you today.     Follow-Up: At [facility], you and your health needs are our priority.  As part of our continuing mission to provide you with exceptional heart care, we have created designated Provider Care Teams.  These Care Teams include your primary Cardiologist (physician) and Advanced Practice Providers (APPs -  Physician Assistants and Nurse Practitioners) who all work together to provide you with the care you need, when you need it.  We recommend signing up for the patient portal called "MyChart".  Sign up information is provided on this After Visit Summary.  MyChart is used to connect with patients for Virtual Visits (Telemedicine).  Patients are able to view lab/test results, encounter notes, upcoming appointments, etc.  Non-urgent messages can be sent to your provider as  well.   To learn more about what you can do with MyChart, go to [email or web address].    Your next appointment:   To be scheduled   9 months with Dr Caryl Comes      Dear Steven Henry  You are scheduled for a Cardioversion on Wednesday, April 17 with Dr. Marlou Porch.  Please arrive at the [facility] (Main Entrance A) at [facility]: [address] [place], [place] [address] at 9:00 AM.   DIET:  Nothing to eat or drink after midnight except a sip of water with medications (see medication instructions below)  MEDICATION INSTRUCTIONS:  ** HOLD: Semaglutide (Ozempic, Rybelsus, Wegovy) for 1 week prior to your cardioversion  ** Continue taking your anticoagulant (blood thinner): Apixaban (Eliquis).   ** Do not take Metformin or any Lantus insulin the morning of your procedure.  ** Do not take Furosemide, Jardiance or Spironolactone the morning of your procedure.  ** You may take your other medications with enough water to get them down safely.  You will need to continue Eliquis after your procedure until you are told by your provider that it is safe to stop.    LABS:   Your labs will be done at the hospital prior to your procedure - you will need to arrive 1 and 1/2 hours prior to your procedure.  FYI:  For your safety, and to allow [place] to monitor your vital signs accurately during the surgery/procedure we request: If you have artificial nails, gel coating, SNS etc, please have those removed prior to your surgery/procedure. Not  having the nail coverings /polish removed may result in cancellation or delay of your surgery/procedure.  You must have a responsible person to drive you home and stay in the waiting area during your procedure. Failure to do so could result in cancellation.  Bring your insurance cards.  *Special Note: Every effort is made to have your procedure done on time. Occasionally there are emergencies that occur at the hospital that may cause delays. Please be  patient if a delay does occur.

## 2022-12-24 NOTE — H&P (View-Only) (Signed)
       Patient Care Team: Pickard, Warren T, MD as PCP - General (Family Medicine) Mallipeddi, Vishnu P, MD as PCP - Cardiology (Cardiology) [facility] as Consulting Physician   HPI  Kaidan E Boot is a 72 y.o. male seen in follow-up for pacemaker implantation for prolonged pauses in the setting of long-term persistent atrial fibrillation Chronic dyspnea nocturnal dyspnea in the setting of COPD.  Known obstructive sleep apnea.  Feeling much better following device implantation without further lightheadedness as well as improved exercise tolerance, and this despite the fact having gone back into atrial fibrillation   The patient denies chest pain, shortness of breath, nocturnal dyspnea, orthopnea or peripheral edema.  There have been no palpitations, lightheadedness or syncope .   DATE TEST EF   9/14 MY[facility]IEW  No perfusion defects   3/23 Echo  65-70%              Date Cr K Hgb  11/23 1.14 4.1 17.9<<18.9            Past Medical History:  Diagnosis Date   A-fib    CHF (congestive heart failure)    CKD (chronic kidney disease), stage III    Colon polyps    Diabetes mellitus type II, uncontrolled    Diabetes mellitus without complication    Hypertension    Morbid obesity    PVD (peripheral vascular disease)    right femoral artery   Sleep apnea    not on CPAP    Sleep apnea     Past Surgical History:  Procedure Laterality Date   CARDIOVERSION N/A 11/05/2022   Procedure: CARDIOVERSION;  Surgeon: Branch, Mary E, MD;  Location: [facility] ENDOSCOPY;  Service: Cardiovascular;  Laterality: N/A;   COLONOSCOPY N/A 09/16/2015   Procedure: COLONOSCOPY;  Surgeon: Robert M Rourk, MD;  Location: [facility] ENDO SUITE;  Service: Endoscopy;  Laterality: N/A;  8:15 AM - pt has c pap   ENDOVENOUS ABLATION SAPHENOUS VEIN W/ LASER Right 08/26/2017   endovenous laser ablation right greater saphenous vein by James Lawson MD    PACEMAKER IMPLANT N/A 09/14/2022    Procedure: PACEMAKER IMPLANT;  Surgeon: Tyeson Tanimoto C, MD;  Location: [facility] INVASIVE CV LAB;  Service: Cardiovascular;  Laterality: N/A;   stab phlebectomy  Right 01/07/2018   stab phlebectomy 10-20 incisions right leg by James Lawson MD     Current Meds  Medication Sig   Accu-Chek FastClix Lancets MISC Check BS QAM - DX: E11.9   albuterol (VENTOLIN HFA) 108 (90 Base) MCG/ACT inhaler Inhale 2 puffs into the lungs every 6 (six) hours as needed for wheezing or shortness of breath.   atorvastatin (LIPITOR) 40 MG tablet TAKE 1 TABLET BY MOUTH DAILY   bisoprolol (ZEBETA) 5 MG tablet Take 1 tablet (5 mg total) by mouth daily.   Blood Glucose Monitoring Suppl (ONE TOUCH ULTRA 2) w/Device KIT USETO CHECK BLOOD SUGAR EVERY MORNING   budesonide-formoterol (SYMBICORT) 160-4.5 MCG/ACT inhaler Inhale 2 puffs into the lungs in the morning and at bedtime.   Cholecalciferol (VITAMIN D3) 10 MCG (400 UNIT) tablet Take 800 Units by mouth daily.   Continuous Blood Gluc Receiver (FREESTYLE LIBRE 2 READER) DEVI For continuous blood glucose monitoring   Continuous Blood Gluc Sensor (FREESTYLE LIBRE 2 SENSOR) MISC APPLY 1 SENSOR ON THE SKIN EVERY 14 DAYS, USE AS DIRECTED TO CHECK BLOOD SUGAR   ELIQUIS 5 MG TABS tablet TAKE 1 TABLET BY MOUTH TWICE DAILY     empagliflozin (JARDIANCE) 25 MG TABS tablet Take 1 tablet (25 mg total) by mouth daily before breakfast.   furosemide (LASIX) 40 MG tablet Take 2 tablets (80 mg total) by mouth 2 (two) times daily.   insulin glargine (LANTUS) 100 UNIT/ML injection Inject 0.25 mLs (25 Units total) into the skin daily. PLEASE INCREASE INSULIN BY 1 UNIT DAILY UNTIL FBG IS <130, THEN HOLD AT THAT DOSE (Patient taking differently: Inject 15 Units into the skin daily.)   ipratropium-albuterol (DUONEB) 0.5-2.5 (3) MG/3ML SOLN Take 3 mLs by nebulization 3 (three) times daily.   Lancets Misc. (ACCU-CHEK FASTCLIX LANCET) KIT Check BS QAM - DX: E11.9   metFORMIN (GLUCOPHAGE) 1000 MG tablet TAKE 1  TABLET BY MOUTH TWICE DAILY WITH a meal   Multiple Vitamin (MULTIVITAMIN) capsule Take 1 capsule by mouth in the morning. Mens one a day   Multiple Vitamins-Minerals (ZINC PO) Take 30 mg by mouth at bedtime.   ONETOUCH ULTRA test strip USE TO CHECK BLOOD SUGAR EVERY MORNING   pantoprazole (PROTONIX) 40 MG tablet Take 40 mg by mouth daily.   Semaglutide, 2 MG/DOSE, 8 MG/3ML SOPN Inject 2 mg as directed once a week.   spironolactone (ALDACTONE) 25 MG tablet TAKE 1 TABLET BY MOUTH DAILY          Review of Systems negative except from HPI and PMH  Physical Exam BP 112/68   Pulse 68   Ht 6' 1" (1.854 m)   Wt 282 lb (127.9 kg)   SpO2 95%   BMI 37.21 kg/m  Well developed and well nourished in no acute distress HENT normal E scleral and icterus clear Neck Supple JVP flat; carotids brisk and full Clear to ausculation Regular rate and rhythm, no murmurs gallops or rub Soft with active bowel sounds No clubbing cyanosis  Edema Alert and oriented, grossly normal motor and sensory function Skin Warm and Dry  ECG atrial fibrillation with underlying ventricular pacing at 68 with a QRSd of 108 ms  Estimated Creatinine Clearance: 102.1 mL/min (by C-G formula based on SCr of 0.93 mg/dL).   Assessment and  Plan   Pacemaker left bundle branch area  Atrial Fibrillation  persistent  OSA Rx  Ventricular tachycardia  Left Ventricular function normal   Erythrocytosis    Claudication   Pulm HTN with RV enlargement   HFpEF  Patient has recurrent persistent atrial fibrillation.  He felt better following cardioversion in his young age I think rhythm control should be the goal.  In the interim, prior to ablation, we will put him on flecainide 100 mg twice daily and undertake cardioversion.  He has calcium scoring anticipated but nothing to suggest obstructive coronary disease.  Continue the Eliquis.  Suspect his symptoms are significantly attenuated by the fact that his heart rates  are much lower with the bisoprolol.  Will keep it as it is as his heart rate excursion is reasonable and symptoms are minimal.  Blood pressure is well-controlled.  Continue him on spironolactone, Zebeta and his Jardiance for both diabetes as well as his HFpEF. 

## 2022-12-24 NOTE — Progress Notes (Signed)
Patient Care Team: [name], MD as PCP - General (Family Medicine) Mallipeddi, Quenten Raven, MD as PCP - Cardiology (Cardiology) [place] as Consulting Physician   HPI  Steven Henry is a 72 y.o. male seen in follow-up for pacemaker implantation for prolonged pauses in the setting of long-term persistent atrial fibrillation Chronic dyspnea nocturnal dyspnea in the setting of COPD.  Known obstructive sleep apnea.  Feeling much better following device implantation without further lightheadedness as well as improved exercise tolerance, and this despite the fact having gone back into atrial fibrillation   The patient denies chest pain, shortness of breath, nocturnal dyspnea, orthopnea or peripheral edema.  There have been no palpitations, lightheadedness or syncope .   DATE TEST EF   9/14 MYOVIEW  No perfusion defects   3/23 Echo  65-70%              Date Cr K Hgb  11/23 1.14 4.1 17.9<<18.9            Past Medical History:  Diagnosis Date   A-fib    CHF (congestive heart failure)    CKD (chronic kidney disease), stage III    Colon polyps    Diabetes mellitus type II, uncontrolled    Diabetes mellitus without complication    Hypertension    Morbid obesity    PVD (peripheral vascular disease)    right femoral artery   Sleep apnea    not on CPAP    Sleep apnea     Past Surgical History:  Procedure Laterality Date   CARDIOVERSION N/A 11/05/2022   Procedure: CARDIOVERSION;  Surgeon: Janina Mayo, MD;  Location: [facility] ENDOSCOPY;  Service: Cardiovascular;  Laterality: N/A;   COLONOSCOPY N/A 09/16/2015   Procedure: COLONOSCOPY;  Surgeon: Daneil Dolin, MD;  Location: [facility] ENDO SUITE;  Service: Endoscopy;  Laterality: N/A;  8:15 AM - pt has c pap   ENDOVENOUS ABLATION SAPHENOUS VEIN W/ LASER Right 08/26/2017   endovenous laser ablation right greater saphenous vein by Tinnie Gens MD    PACEMAKER IMPLANT N/A 09/14/2022    Procedure: PACEMAKER IMPLANT;  Surgeon: Deboraha Sprang, MD;  Location: [place] [facility];  Service: Cardiovascular;  Laterality: N/A;   stab phlebectomy  Right 01/07/2018   stab phlebectomy 10-20 incisions right leg by Tinnie Gens MD     Current Meds  Medication Sig   Accu-Chek FastClix Lancets MISC Check BS QAM - DX: E11.9   albuterol (VENTOLIN HFA) 108 (90 Base) MCG/ACT inhaler Inhale 2 puffs into the lungs every 6 (six) hours as needed for wheezing or shortness of breath.   atorvastatin (LIPITOR) 40 MG tablet TAKE 1 TABLET BY MOUTH DAILY   bisoprolol (ZEBETA) 5 MG tablet Take 1 tablet (5 mg total) by mouth daily.   Blood Glucose Monitoring Suppl (ONE TOUCH ULTRA 2) w/Device KIT USETO CHECK BLOOD SUGAR EVERY MORNING   budesonide-formoterol (SYMBICORT) 160-4.5 MCG/ACT inhaler Inhale 2 puffs into the lungs in the morning and at bedtime.   Cholecalciferol (VITAMIN D3) 10 MCG (400 UNIT) tablet Take 800 Units by mouth daily.   Continuous Blood Gluc Receiver (FREESTYLE LIBRE 2 READER) DEVI For continuous blood glucose monitoring   Continuous Blood Gluc Sensor (FREESTYLE LIBRE 2 SENSOR) MISC APPLY 1 SENSOR ON THE SKIN EVERY 14 DAYS, USE AS DIRECTED TO CHECK BLOOD SUGAR   ELIQUIS 5 MG TABS tablet TAKE 1 TABLET BY MOUTH TWICE DAILY  empagliflozin (JARDIANCE) 25 MG TABS tablet Take 1 tablet (25 mg total) by mouth daily before breakfast.   furosemide (LASIX) 40 MG tablet Take 2 tablets (80 mg total) by mouth 2 (two) times daily.   insulin glargine (LANTUS) 100 UNIT/ML injection Inject 0.25 mLs (25 Units total) into the skin daily. PLEASE INCREASE INSULIN BY 1 UNIT DAILY UNTIL FBG IS <130, THEN HOLD AT THAT DOSE (Patient taking differently: Inject 15 Units into the skin daily.)   ipratropium-albuterol (DUONEB) 0.5-2.5 (3) MG/3ML SOLN Take 3 mLs by nebulization 3 (three) times daily.   Lancets Misc. (ACCU-CHEK FASTCLIX LANCET) KIT Check BS QAM - DX: E11.9   metFORMIN (GLUCOPHAGE) 1000 MG tablet TAKE 1  TABLET BY MOUTH TWICE DAILY WITH a meal   Multiple Vitamin (MULTIVITAMIN) capsule Take 1 capsule by mouth in the morning. Mens one a day   Multiple Vitamins-Minerals (ZINC PO) Take 30 mg by mouth at bedtime.   ONETOUCH ULTRA test strip USE TO CHECK BLOOD SUGAR EVERY MORNING   pantoprazole (PROTONIX) 40 MG tablet Take 40 mg by mouth daily.   Semaglutide, 2 MG/DOSE, 8 MG/3ML SOPN Inject 2 mg as directed once a week.   spironolactone (ALDACTONE) 25 MG tablet TAKE 1 TABLET BY MOUTH DAILY          Review of Systems negative except from HPI and PMH  Physical Exam BP 112/68   Pulse 68   Ht 6\' 1"  (1.854 m)   Wt 282 lb (127.9 kg)   SpO2 95%   BMI 37.21 kg/m  Well developed and well nourished in no acute distress HENT normal E scleral and icterus clear Neck Supple JVP flat; carotids brisk and full Clear to ausculation Regular rate and rhythm, no murmurs gallops or rub Soft with active bowel sounds No clubbing cyanosis  Edema Alert and oriented, grossly normal motor and sensory function Skin Warm and Dry  ECG atrial fibrillation with underlying ventricular pacing at 68 with a QRSd of 108 ms  Estimated Creatinine Clearance: 102.1 mL/min (by C-G formula based on SCr of 0.93 mg/dL).   Assessment and  Plan   Pacemaker left bundle branch area  Atrial Fibrillation  persistent  OSA Rx  Ventricular tachycardia  Left Ventricular function normal   Erythrocytosis    Claudication   Pulm HTN with RV enlargement   HFpEF  Patient has recurrent persistent atrial fibrillation.  He felt better following cardioversion in his young age I think rhythm control should be the goal.  In the interim, prior to ablation, we will put him on flecainide 100 mg twice daily and undertake cardioversion.  He has calcium scoring anticipated but nothing to suggest obstructive coronary disease.  Continue the Eliquis.  Suspect his symptoms are significantly attenuated by the fact that his heart rates  are much lower with the bisoprolol.  Will keep it as it is as his heart rate excursion is reasonable and symptoms are minimal.  Blood pressure is well-controlled.  Continue him on spironolactone, Zebeta and his Jardiance for both diabetes as well as his HFpEF.

## 2022-12-30 NOTE — Pre-Procedure Instructions (Addendum)
Attempted to call patient regarding holding GLP1 for procedure scheduled for April 17.  Left voicemail that he will need to hold for 7 days prior to procedure on 01/09/23  1910- Patient called back he takes ozempic on Monday's.  Told him he can take it Monday 4/8, but don't take on 4/15.  His procedure is scheduled on 4/17.  He verbalized understanding.

## 2023-01-01 DIAGNOSIS — J9601 Acute respiratory failure with hypoxia: Secondary | ICD-10-CM | POA: Diagnosis not present

## 2023-01-08 NOTE — Pre-Procedure Instructions (Signed)
  Instructed patient on following items: Arrival time 0930 NPO after MN... Bp and anticoagulation meds ok with sips of water AM of procedure Responsible party to drive and stay with after procedure and for 24hrs Have you missed any does of anticoagulant?. Pt takes eliquis, states no missed doses and instructed to take medication as instructed.

## 2023-01-09 ENCOUNTER — Other Ambulatory Visit: Payer: Self-pay | Admitting: Family Medicine

## 2023-01-09 ENCOUNTER — Encounter (HOSPITAL_COMMUNITY): Payer: Self-pay | Admitting: Cardiology

## 2023-01-09 ENCOUNTER — Ambulatory Visit (HOSPITAL_BASED_OUTPATIENT_CLINIC_OR_DEPARTMENT_OTHER): Payer: Medicare Other | Admitting: Certified Registered Nurse Anesthetist

## 2023-01-09 ENCOUNTER — Ambulatory Visit (HOSPITAL_COMMUNITY)
Admission: RE | Admit: 2023-01-09 | Discharge: 2023-01-09 | Disposition: A | Discharge status: Home / Self Care | Payer: Medicare Other | Attending: Cardiology | Admitting: Cardiology

## 2023-01-09 ENCOUNTER — Other Ambulatory Visit: Payer: Self-pay

## 2023-01-09 ENCOUNTER — Encounter (HOSPITAL_COMMUNITY)
Admission: RE | Disposition: A | Discharge status: Home / Self Care | Payer: Self-pay | Source: Home / Self Care | Attending: Cardiology

## 2023-01-09 ENCOUNTER — Ambulatory Visit (HOSPITAL_COMMUNITY): Payer: Medicare Other | Admitting: Certified Registered Nurse Anesthetist

## 2023-01-09 DIAGNOSIS — D751 Secondary polycythemia: Secondary | ICD-10-CM | POA: Insufficient documentation

## 2023-01-09 DIAGNOSIS — I10 Essential (primary) hypertension: Secondary | ICD-10-CM | POA: Diagnosis not present

## 2023-01-09 DIAGNOSIS — I350 Nonrheumatic aortic (valve) stenosis: Secondary | ICD-10-CM | POA: Diagnosis not present

## 2023-01-09 DIAGNOSIS — E1151 Type 2 diabetes mellitus with diabetic peripheral angiopathy without gangrene: Secondary | ICD-10-CM | POA: Insufficient documentation

## 2023-01-09 DIAGNOSIS — F17200 Nicotine dependence, unspecified, uncomplicated: Secondary | ICD-10-CM | POA: Diagnosis not present

## 2023-01-09 DIAGNOSIS — Z7984 Long term (current) use of oral hypoglycemic drugs: Secondary | ICD-10-CM | POA: Diagnosis not present

## 2023-01-09 DIAGNOSIS — I4819 Other persistent atrial fibrillation: Secondary | ICD-10-CM | POA: Insufficient documentation

## 2023-01-09 DIAGNOSIS — J449 Chronic obstructive pulmonary disease, unspecified: Secondary | ICD-10-CM | POA: Insufficient documentation

## 2023-01-09 DIAGNOSIS — I272 Pulmonary hypertension, unspecified: Secondary | ICD-10-CM | POA: Insufficient documentation

## 2023-01-09 DIAGNOSIS — F17210 Nicotine dependence, cigarettes, uncomplicated: Secondary | ICD-10-CM

## 2023-01-09 DIAGNOSIS — G4733 Obstructive sleep apnea (adult) (pediatric): Secondary | ICD-10-CM | POA: Diagnosis not present

## 2023-01-09 DIAGNOSIS — Z950 Presence of cardiac pacemaker: Secondary | ICD-10-CM | POA: Insufficient documentation

## 2023-01-09 DIAGNOSIS — I472 Ventricular tachycardia, unspecified: Secondary | ICD-10-CM | POA: Insufficient documentation

## 2023-01-09 DIAGNOSIS — Z7901 Long term (current) use of anticoagulants: Secondary | ICD-10-CM | POA: Diagnosis not present

## 2023-01-09 DIAGNOSIS — I5032 Chronic diastolic (congestive) heart failure: Secondary | ICD-10-CM | POA: Insufficient documentation

## 2023-01-09 DIAGNOSIS — Z794 Long term (current) use of insulin: Secondary | ICD-10-CM | POA: Diagnosis not present

## 2023-01-09 DIAGNOSIS — N183 Chronic kidney disease, stage 3 unspecified: Secondary | ICD-10-CM | POA: Diagnosis not present

## 2023-01-09 DIAGNOSIS — I130 Hypertensive heart and chronic kidney disease with heart failure and stage 1 through stage 4 chronic kidney disease, or unspecified chronic kidney disease: Secondary | ICD-10-CM | POA: Insufficient documentation

## 2023-01-09 DIAGNOSIS — I4891 Unspecified atrial fibrillation: Secondary | ICD-10-CM

## 2023-01-09 DIAGNOSIS — E1122 Type 2 diabetes mellitus with diabetic chronic kidney disease: Secondary | ICD-10-CM | POA: Insufficient documentation

## 2023-01-09 DIAGNOSIS — I480 Paroxysmal atrial fibrillation: Secondary | ICD-10-CM

## 2023-01-09 HISTORY — PX: CARDIOVERSION: SHX1299

## 2023-01-09 LAB — POCT I-STAT, CHEM 8
BUN: 19 mg/dL (ref 8–23)
Calcium, Ion: 1.17 mmol/L (ref 1.15–1.40)
Chloride: 99 mmol/L (ref 98–111)
Creatinine, Ser: 0.8 mg/dL (ref 0.61–1.24)
Glucose, Bld: 165 mg/dL — ABNORMAL HIGH (ref 70–99)
HCT: 48 % (ref 39.0–52.0)
Hemoglobin: 16.3 g/dL (ref 13.0–17.0)
Potassium: 4.5 mmol/L (ref 3.5–5.1)
Sodium: 141 mmol/L (ref 135–145)
TCO2: 31 mmol/L (ref 22–32)

## 2023-01-09 SURGERY — CARDIOVERSION
Anesthesia: General

## 2023-01-09 MED ORDER — PROPOFOL 10 MG/ML IV BOLUS
INTRAVENOUS | Status: DC | PRN
  Administered 2023-01-09: 30 mg via INTRAVENOUS

## 2023-01-09 MED ORDER — SODIUM CHLORIDE 0.9 % IV SOLN: INTRAVENOUS | Status: DC

## 2023-01-09 MED ORDER — ETOMIDATE 2 MG/ML IV SOLN
INTRAVENOUS | Status: DC | PRN
  Administered 2023-01-09: 20 mg via INTRAVENOUS

## 2023-01-09 MED ORDER — LIDOCAINE 2% (20 MG/ML) 5 ML SYRINGE
INTRAMUSCULAR | Status: DC | PRN
  Administered 2023-01-09: 40 mg via INTRAVENOUS

## 2023-01-09 SURGICAL SUPPLY — 1 items: ELECT DEFIB PAD ADLT CADENCE (PAD) ×1 IMPLANT

## 2023-01-09 NOTE — Discharge Instructions (Signed)

## 2023-01-09 NOTE — Interval H&P Note (Signed)
History and Physical Interval Note:  01/09/2023 10:11 AM  Steven Henry  has presented today for surgery, with the diagnosis of afib.  The various methods of treatment have been discussed with the patient and family. After consideration of risks, benefits and other options for treatment, the patient has consented to  Procedure(s): CARDIOVERSION (N/A) as a surgical intervention.  The patient's history has been reviewed, patient examined, no change in status, stable for surgery.  I have reviewed the patient's chart and labs.  Questions were answered to the patient's satisfaction.     [employer]

## 2023-01-09 NOTE — Interval H&P Note (Signed)
History and Physical Interval Note:  01/09/2023 9:10 AM  Steven Henry  has presented today for surgery, with the diagnosis of afib.  The various methods of treatment have been discussed with the patient and family. After consideration of risks, benefits and other options for treatment, the patient has consented to  Procedure(s): CARDIOVERSION (N/A) as a surgical intervention.  The patient's history has been reviewed, patient examined, no change in status, stable for surgery.  I have reviewed the patient's chart and labs.  Questions were answered to the patient's satisfaction.     [employer]

## 2023-01-09 NOTE — CV Procedure (Signed)
    Electrical Cardioversion Procedure Note Steven Henry [id number] 1951/09/22  Procedure: Electrical Cardioversion Indications:  Atrial Fibrillation  Time Out: Verified patient identification, verified procedure,medications/allergies/relevent history reviewed, required imaging and test results available.  Performed  Procedure Details  The patient was NPO after midnight. Anesthesia was administered at the beside  by Dr.Jackson with propofol.  Cardioversion was performed with synchronized biphasic defibrillation via AP pads with 200 joules.  1 attempt(s) were performed.  The patient converted to normal sinus rhythm. The patient tolerated the procedure well   IMPRESSION:  Successful cardioversion of atrial fibrillation -Pacer interrogated. Functioning well    Donato Schultz 01/09/2023, 10:19 AM

## 2023-01-09 NOTE — Anesthesia Postprocedure Evaluation (Signed)
Anesthesia Post Note  Patient: Steven Henry  Procedure(s) Performed: CARDIOVERSION     Patient location during evaluation: Cath Lab Anesthesia Type: General Level of consciousness: awake and alert, patient cooperative and oriented Pain management: pain level controlled Vital Signs Assessment: post-procedure vital signs reviewed and stable Respiratory status: spontaneous breathing, nonlabored ventilation and respiratory function stable Cardiovascular status: blood pressure returned to baseline and stable Postop Assessment: no apparent nausea or vomiting and able to ambulate Anesthetic complications: no   No notable events documented.  Last Vitals:  Vitals:   01/09/23 1045 01/09/23 1050  BP: 112/86 109/79  Pulse: 71 68  Resp: 17 18  Temp:    SpO2: 93% 94%    Last Pain:  Vitals:   01/09/23 1028  TempSrc: Tympanic                 Daniqua Campoy,E. Skyler Dusing

## 2023-01-09 NOTE — Anesthesia Preprocedure Evaluation (Addendum)
Anesthesia Evaluation  Patient identified by MRN, date of birth, ID band Patient awake    Reviewed: Allergy & Precautions, NPO status , Patient's Chart, lab work & pertinent test results  History of Anesthesia Complications Negative for: history of anesthetic complications  Airway Mallampati: II  TM Distance: >3 FB Neck ROM: Full    Dental  (+) Missing, Dental Advisory Given, Caps   Pulmonary sleep apnea (does not use CPAP), Continuous Positive Airway Pressure Ventilation and Oxygen sleep apnea , COPD,  COPD inhaler, Current SmokerPatient did not abstain from smoking.   breath sounds clear to auscultation       Cardiovascular hypertension, Pt. on medications (-) angina + Peripheral Vascular Disease  + dysrhythmias Atrial Fibrillation + pacemaker + Valvular Problems/Murmurs AS  Rhythm:Irregular Rate:Normal + Systolic murmurs 09/6107 ECHO: EF 55-60%, normal LVF,  RVF is mildly reduced. The RV size is mildly enlarged. There is mildly elevated pulmonary artery systolic pressure. The estimated right ventricular systolic pressure is 43.5 mmHg.   3. Left atrial size was severely dilated.   4. Right atrial size was moderately dilated.   5. The mitral valve is grossly normal. No evidence of MR. No evidence of MS.   6. The aortic valve is calcified. There is moderate calcification of the  aortic valve. Aortic valve regurgitation is not visualized. Moderate to severe AS. Aortic valve area, by VTI measures 1.27 cm. Aortic valve mean gradient measures 34.5 mmHg. Aortic valve Vmax measures 4.15 m/s.   7. Aortic dilatation noted. There is borderline dilatation of the aortic  root, measuring 39 mm. There is mild dilatation of the ascending aorta,  measuring 40 mm.     Neuro/Psych negative neurological ROS     GI/Hepatic Neg liver ROS,GERD  Medicated and Controlled,,  Endo/Other  diabetes (glu 165), Insulin Dependent, Oral Hypoglycemic Agents   Semaglutide: over a week ago BMI 36.8  Renal/GU Renal InsufficiencyRenal disease     Musculoskeletal   Abdominal  (+) + obese  Peds  Hematology eliquis   Anesthesia Other Findings   Reproductive/Obstetrics                             Anesthesia Physical Anesthesia Plan  ASA: 4  Anesthesia Plan: General   Post-op Pain Management: Minimal or no pain anticipated   Induction: Intravenous  PONV Risk Score and Plan: 2 and Treatment may vary due to age or medical condition  Airway Management Planned: Natural Airway and Mask  Additional Equipment: None  Intra-op Plan:   Post-operative Plan:   Informed Consent: I have reviewed the patients History and Physical, chart, labs and discussed the procedure including the risks, benefits and alternatives for the proposed anesthesia with the patient or authorized representative who has indicated his/her understanding and acceptance.     Dental advisory given  Plan Discussed with: CRNA and Surgeon  Anesthesia Plan Comments:         Anesthesia Quick Evaluation

## 2023-01-09 NOTE — Transfer of Care (Signed)
Immediate Anesthesia Transfer of Care Note  Patient: Steven Henry  Procedure(s) Performed: CARDIOVERSION  Patient Location: Cath Lab  Anesthesia Type:General  Level of Consciousness: drowsy and patient cooperative  Airway & Oxygen Therapy: Patient Spontanous Breathing  Post-op Assessment: Report given to RN, Post -op Vital signs reviewed and stable, and Patient moving all extremities X 4  Post vital signs: Reviewed and stable  Last Vitals:  Vitals Value Taken Time  BP 142/129 01/09/23 1020  Temp    Pulse 74 01/09/23 1021  Resp 31 01/09/23 1021  SpO2 94 % 01/09/23 1021    Last Pain:  Vitals:   01/09/23 0905  TempSrc: Temporal         Complications: No notable events documented.

## 2023-01-11 ENCOUNTER — Ambulatory Visit: Payer: Medicare Other | Admitting: Internal Medicine

## 2023-01-11 ENCOUNTER — Encounter: Payer: Self-pay | Admitting: Cardiovascular Disease

## 2023-01-11 ENCOUNTER — Ambulatory Visit: Payer: Medicare Other | Attending: Cardiovascular Disease | Admitting: Cardiovascular Disease

## 2023-01-11 VITALS — BP 122/60 | HR 76 | Ht 73.0 in | Wt 284.6 lb

## 2023-01-11 DIAGNOSIS — I4819 Other persistent atrial fibrillation: Secondary | ICD-10-CM | POA: Diagnosis not present

## 2023-01-11 DIAGNOSIS — I350 Nonrheumatic aortic (valve) stenosis: Secondary | ICD-10-CM | POA: Diagnosis not present

## 2023-01-11 DIAGNOSIS — I5032 Chronic diastolic (congestive) heart failure: Secondary | ICD-10-CM

## 2023-01-11 DIAGNOSIS — Z01810 Encounter for preprocedural cardiovascular examination: Secondary | ICD-10-CM

## 2023-01-11 NOTE — Patient Instructions (Addendum)
Medication Instructions:  Your physician recommends that you continue on your current medications as directed. Please refer to the Current Medication list given to you today.  *If you need a refill on your cardiac medications before your next appointment, please call your pharmacy*   Lab Work: CBC, BMET today If you have labs (blood work) drawn today and your tests are completely normal, you will receive your results only by: MyChart Message (if you have MyChart) OR A paper copy in the mail If you have any lab test that is abnormal or we need to change your treatment, we will call you to review the results.   Testing/Procedures: R & L Heart Catheterization Your physician has requested that you have a cardiac catheterization. Cardiac catheterization is used to diagnose and/or treat various heart conditions. Doctors may recommend this procedure for a number of different reasons. The most common reason is to evaluate chest pain. Chest pain can be a symptom of coronary artery disease (CAD), and cardiac catheterization can show whether plaque is narrowing or blocking your heart's arteries. This procedure is also used to evaluate the valves, as well as measure the blood flow and oxygen levels in different parts of your heart. For further information please visit [email or web address] Please follow instruction sheet, as given.  TAVR CT's (you will be called to schedule)  Follow-Up: At [facility], you and your health needs are our priority.  As part of our continuing mission to provide you with exceptional heart care, we have created designated Provider Care Teams.  These Care Teams include your primary Cardiologist (physician) and Advanced Practice Providers (APPs -  Physician Assistants and Nurse Practitioners) who all work together to provide you with the care you need, when you need it.  Your next appointment:   Structural Team will follow-up  Provider:   [name], MD      Other Instructions       Cardiac/Peripheral Catheterization   You are scheduled for a Cardiac Catheterization on MONDAY, May 20 with Dr. [name].  1. Please arrive at the [facility] (Main Entrance A) at [facility]: [address] [place], [place] [address] at 7:00 AM (This time is two hours before your procedure to ensure your preparation). Free valet parking service is available. You will check in at ADMITTING. The support person will be asked to wait in the waiting room.  It is OK to have someone drop you off and come back when you are ready to be discharged.        Special note: Every effort is made to have your procedure done on time. Please understand that emergencies sometimes delay scheduled procedures.  2. Diet: Do not eat solid foods after midnight.  You may have clear liquids until 5 AM the day of the procedure.  3. Labs: TODAY  4. Medication instructions in preparation for your procedure:   Contrast Allergy: No  DO NOT TAKE Eliquis for 2 days prior to procedure, may resume day after (last dose Friday 5/17, resume 5/21)  TAKE 1/2 usual dose of Lantus insulin the night before procedure (12.5 units)  DO NOT TAKE Metformin the day of procedure, and hold for 2 days afterwards (resume Thursday 5/23)  DO NOT TAKE Furosemide or Spironolactone the day of procedure  On the morning of your procedure, take Aspirin 81 mg and any morning medicines NOT listed above.  You may use sips of water.  5. Plan to go home the same day, you  will only stay overnight if medically necessary. 6. You MUST have a responsible adult to drive you home. 7. An adult MUST be with you the first 24 hours after you arrive home. 8. Bring a current list of your medications, and the last time and date medication taken. 9. Bring ID and current insurance cards. 10.Please wear clothes that are easy to get on and off and wear slip-on shoes.  Thank you for allowing [place] to care for you!   -- [facility]

## 2023-01-11 NOTE — Progress Notes (Signed)
Cardiology Office Note:    Date:  01/11/2023   ID:  Steven Henry, DOB 1950-10-27, MRN [id number]  PCP:  Donita Brooks, MD    HeartCare Providers Cardiologist:  Marjo Bicker, MD     Referring MD: Donita Brooks, MD   Chief Complaint  Patient presents with   Aortic Stenosis    History of Present Illness:    Steven Henry is a 72 y.o. male referred by Dr Jenene Slicker for evaluation of aortic stenosis.  He has a history of atrial fibrillation and permanent pacemaker placement in 2023.  Comorbid conditions include hypertension, chronic tobacco abuse, type 2 diabetes, mixed hyperlipidemia, and obstructive sleep apnea.  He has been followed for moderate to severe aortic stenosis.  The patient has had multiple hospitalizations for acute on chronic heart failure with preserved ejection fraction.  He has had problems with atrial fibrillation and has undergone cardioversion.  The patient is managed with a rhythm control strategy on flecainide.  The patient is here alone today. He was told that he was 'born with a murmur' but never had any issues when he was younger. He used to be a 2 ppd smoker, now cutting back significantly but still smoking a little bit. He doesn't do a lot of physical activity anymore, but he still works at [employer] all around the area. He has worked at the [employer] since the early 1970's.  He reports no major surgeries in the past.  The patient is short of breath with activity.  His symptoms have been intermittently exacerbated by acute heart failure, currently doing fairly well.  He denies chest pain or pressure, lightheadedness, or syncope since pacemaker implantation.  He denies lower extremity edema, orthopnea, or PND at present.  The patient has 2 daughters who live locally.  They are his primary family support.  He has not undergone cardiac catheterization in the past.  Past Medical History:  Diagnosis Date   A-fib    CHF (congestive heart  failure)    CKD (chronic kidney disease), stage III    Colon polyps    Diabetes mellitus type II, uncontrolled    Diabetes mellitus without complication    Hypertension    Morbid obesity    PVD (peripheral vascular disease)    right femoral artery   Sleep apnea    not on CPAP    Sleep apnea     Past Surgical History:  Procedure Laterality Date   CARDIOVERSION N/A 11/05/2022   Procedure: CARDIOVERSION;  Surgeon: Maisie Fus, MD;  Location: [facility] ENDOSCOPY;  Service: Cardiovascular;  Laterality: N/A;   CARDIOVERSION N/A 01/09/2023   Procedure: CARDIOVERSION;  Surgeon: Jake Bathe, MD;  Location: [facility] INVASIVE CV LAB;  Service: Cardiovascular;  Laterality: N/A;   COLONOSCOPY N/A 09/16/2015   Procedure: COLONOSCOPY;  Surgeon: Corbin Ade, MD;  Location: [facility] ENDO SUITE;  Service: Endoscopy;  Laterality: N/A;  8:15 AM - pt has c pap   ENDOVENOUS ABLATION SAPHENOUS VEIN W/ LASER Right 08/26/2017   endovenous laser ablation right greater saphenous vein by Josephina Gip MD    PACEMAKER IMPLANT N/A 09/14/2022   Procedure: PACEMAKER IMPLANT;  Surgeon: Duke Salvia, MD;  Location: [facility] INVASIVE CV LAB;  Service: Cardiovascular;  Laterality: N/A;   stab phlebectomy  Right 01/07/2018   stab phlebectomy 10-20 incisions right leg by Josephina Gip MD     Current Medications: Current Meds  Medication Sig   Accu-Chek FastClix Lancets MISC Check  BS QAM - DX: E11.9   albuterol (VENTOLIN HFA) 108 (90 Base) MCG/ACT inhaler Inhale 2 puffs into the lungs every 6 (six) hours as needed for wheezing or shortness of breath.   atorvastatin (LIPITOR) 40 MG tablet TAKE 1 TABLET BY MOUTH DAILY (Patient taking differently: Take 40 mg by mouth at bedtime.)   bisoprolol (ZEBETA) 5 MG tablet Take 1 tablet (5 mg total) by mouth daily. (Patient taking differently: Take 5 mg by mouth at bedtime.)   Blood Glucose Monitoring Suppl (ONE TOUCH ULTRA 2) w/Device KIT USETO CHECK BLOOD SUGAR EVERY MORNING    budesonide-formoterol (SYMBICORT) 160-4.5 MCG/ACT inhaler Inhale 2 puffs into the lungs in the morning and at bedtime. (Patient taking differently: Inhale 2 puffs into the lungs daily.)   Continuous Blood Gluc Receiver (FREESTYLE LIBRE 2 READER) DEVI For continuous blood glucose monitoring   Continuous Blood Gluc Sensor (FREESTYLE LIBRE 2 SENSOR) MISC APPLY 1 SENSOR ON THE SKIN EVERY 14 DAYS, USE AS DIRECTED TO CHECK BLOOD SUGAR   ELIQUIS 5 MG TABS tablet TAKE 1 TABLET BY MOUTH TWICE DAILY   empagliflozin (JARDIANCE) 25 MG TABS tablet TAKE 1 TABLET BY MOUTH DAILY BEFORE breakfast   flecainide (TAMBOCOR) 100 MG tablet Take 1 tablet (100 mg total) by mouth 2 (two) times daily.   furosemide (LASIX) 40 MG tablet Take 2 tablets (80 mg total) by mouth 2 (two) times daily.   insulin glargine (LANTUS) 100 UNIT/ML injection Inject 0.25 mLs (25 Units total) into the skin daily. PLEASE INCREASE INSULIN BY 1 UNIT DAILY UNTIL FBG IS <130, THEN HOLD AT THAT DOSE   ipratropium-albuterol (DUONEB) 0.5-2.5 (3) MG/3ML SOLN Take 3 mLs by nebulization 3 (three) times daily. (Patient taking differently: Take 3 mLs by nebulization every 4 (four) hours as needed (shortness of breath).)   Lancets Misc. (ACCU-CHEK FASTCLIX LANCET) KIT Check BS QAM - DX: E11.9   metFORMIN (GLUCOPHAGE) 1000 MG tablet TAKE 1 TABLET BY MOUTH TWICE DAILY WITH a meal   Multiple Vitamin (MULTIVITAMIN) capsule Take 1 capsule by mouth in the morning. Mens one a day   ONETOUCH ULTRA test strip USE TO CHECK BLOOD SUGAR EVERY MORNING   Semaglutide, 2 MG/DOSE, 8 MG/3ML SOPN Inject 2 mg as directed once a week.   spironolactone (ALDACTONE) 25 MG tablet TAKE 1 TABLET BY MOUTH DAILY   Zinc 30 MG CAPS Take 30 mg by mouth daily.     Allergies:   Patient has no known allergies.   Social History   Socioeconomic History   Marital status: Divorced    Spouse name: Not on file   Number of children: 2   Years of education: Not on file   Highest education  level: Not on file  Occupational History   Occupation: Self Employed  Tobacco Use   Smoking status: Former    Packs/day: 1.50    Years: 30.00    Additional pack years: 0.00    Total pack years: 45.00    Types: Cigarettes    Start date: 04/04/1958   Smokeless tobacco: Never   Tobacco comments:    Quit smoking day of [facility] 11/07/22    Started back due to stress - 1/2 pack per day - trying to cut back down. - 12/17/2022  Vaping Use   Vaping Use: Never used  Substance and Sexual Activity   Alcohol use: Yes    Alcohol/week: 1.0 standard drink of alcohol    Types: 1 Cans of beer per week    Comment:  1 beer once a month 10/12/22   Drug use: No   Sexual activity: Not on file    Comment: divorced, 2 daughters.  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  Other Topics Concern   Not on file  Social History Narrative   Pt states he usually smokes after eating or when stressed   Social Determinants of Health   Financial Resource Strain: Low Risk  (11/16/2022)   Overall Financial Resource Strain (CARDIA)    Difficulty of Paying Living Expenses: Not very hard  Food Insecurity: No Food Insecurity (11/16/2022)   Hunger Vital Sign    Worried About Running Out of Food in the Last Year: Never true    Ran Out of Food in the Last Year: Never true  Transportation Needs: No Transportation Needs (10/26/2022)   PRAPARE - Administrator, Civil Service (Medical): No    Lack of Transportation (Non-Medical): No  Physical Activity: Sufficiently Active (09/06/2022)   Exercise Vital Sign    Days of Exercise per Week: 5 days    Minutes of Exercise per Session: 30 min  Stress: No Stress Concern Present (09/06/2022)   [employer] of Occupational Health - Occupational Stress Questionnaire    Feeling of Stress : Not at all  Social Connections: Socially Isolated (09/06/2022)   Social Connection and Isolation Panel [NHANES]    Frequency of Communication with Friends and Family: More than three  times a week    Frequency of Social Gatherings with Friends and Family: Three times a week    Attends Religious Services: Never    Active Member of Clubs or Organizations: No    Attends Banker Meetings: Never    Marital Status: Divorced     Family History: The patient's family history includes Colon cancer in his mother; Diabetes in his mother, sister, sister, and sister; Heart disease in his mother.  ROS:   Please see the history of present illness.    All other systems reviewed and are negative.  EKGs/Labs/Other Studies Reviewed:    The following studies were reviewed today: Cardiac Studies & Procedures     STRESS TESTS  NM MYOCAR MULTI W/SPECT W 06/08/2013   ECHOCARDIOGRAM  ECHOCARDIOGRAM COMPLETE 10/26/2022  Narrative ECHOCARDIOGRAM REPORT    Patient Name:   Steven Henry Date of Exam: 10/26/2022 Medical Rec #:  [id number]           Height:       73.0 in Accession #:    [id number]          Weight:       286.6 lb Date of Birth:  09/21/1951           BSA:          2.507 m Patient Age:    71 years            BP:           104/65 mmHg Patient Gender: M                   HR:           77 bpm. [facility]  Procedure: 2D Echo, Cardiac Doppler, Color Doppler and Intracardiac Opacification Agent  Indications:    Congestive Heart Failure I50.9  History:        Patient has prior history of Echocardiogram examinations, most recent 02/26/2022. Pacemaker, Arrythmias:Atrial Fibrillation, Signs/Symptoms:Dyspnea; Risk Factors:Sleep Apnea, Current Smoker, Diabetes, Hypertension and Dyslipidemia.  Sonographer:  Aron Baba Referring Phys: [id number] ASIA B ZIERLE-GHOSH   Sonographer Comments: Technically difficult study due to poor echo windows. Image acquisition challenging due to patient body habitus, Image acquisition challenging due to respiratory motion and Image acquisition challenging due to COPD. IMPRESSIONS   1. Left ventricular ejection  fraction, by estimation, is 55 to 60%. The left ventricle has normal function. The left ventricle has no regional wall motion abnormalities. Left ventricular diastolic function could not be evaluated. 2. Right ventricular systolic function is mildly reduced. The right ventricular size is mildly enlarged. There is mildly elevated pulmonary artery systolic pressure. The estimated right ventricular systolic pressure is 43.5 mmHg. 3. Left atrial size was severely dilated. 4. Right atrial size was moderately dilated. 5. The mitral valve is grossly normal. No evidence of mitral valve regurgitation. No evidence of mitral stenosis. 6. The aortic valve is calcified. There is moderate calcification of the aortic valve. Aortic valve regurgitation is not visualized. Moderate to severe aortic valve stenosis. Aortic valve area, by VTI measures 1.27 cm. Aortic valve mean gradient measures 34.5 mmHg. Aortic valve Vmax measures 4.15 m/s. 7. Aortic dilatation noted. There is borderline dilatation of the aortic root, measuring 39 mm. There is mild dilatation of the ascending aorta, measuring 40 mm. 8. The inferior vena cava is dilated in size with <50% respiratory variability, suggesting right atrial pressure of 15 mmHg.  Comparison(s): No significant change from prior study.  FINDINGS Left Ventricle: Left ventricular ejection fraction, by estimation, is 55 to 60%. The left ventricle has normal function. The left ventricle has no regional wall motion abnormalities. Definity contrast agent was given IV to delineate the left ventricular endocardial borders. The left ventricular internal cavity size was normal in size. There is no left ventricular hypertrophy. Left ventricular diastolic function could not be evaluated due to atrial fibrillation. Left ventricular diastolic function could not be evaluated.  Right Ventricle: The right ventricular size is mildly enlarged. No increase in right ventricular wall thickness.  Right ventricular systolic function is mildly reduced. There is mildly elevated pulmonary artery systolic pressure. The tricuspid regurgitant velocity is 2.67 m/s, and with an assumed right atrial pressure of 15 mmHg, the estimated right ventricular systolic pressure is 43.5 mmHg.  Left Atrium: Left atrial size was severely dilated.  Right Atrium: Right atrial size was moderately dilated.  Pericardium: There is no evidence of pericardial effusion.  Mitral Valve: The mitral valve is grossly normal. No evidence of mitral valve regurgitation. No evidence of mitral valve stenosis.  Tricuspid Valve: The tricuspid valve is not well visualized. Tricuspid valve regurgitation is not demonstrated. No evidence of tricuspid stenosis.  Aortic Valve: The aortic valve is calcified. There is moderate calcification of the aortic valve. Aortic valve regurgitation is not visualized. Moderate to severe aortic stenosis is present. Aortic valve mean gradient measures 34.5 mmHg. Aortic valve peak gradient measures 68.9 mmHg. Aortic valve area, by VTI measures 1.27 cm.  Pulmonic Valve: The pulmonic valve was not well visualized. Pulmonic valve regurgitation is not visualized. No evidence of pulmonic stenosis.  Aorta: Aortic dilatation noted. There is borderline dilatation of the aortic root, measuring 39 mm. There is mild dilatation of the ascending aorta, measuring 40 mm.  Venous: The inferior vena cava is dilated in size with less than 50% respiratory variability, suggesting right atrial pressure of 15 mmHg.  IAS/Shunts: No atrial level shunt detected by color flow Doppler.   LEFT VENTRICLE PLAX 2D LVIDd:         5.30  cm   Diastology LVIDs:         4.10 cm   LV e' medial:    7.30 cm/s LV PW:         1.20 cm   LV E/e' medial:  14.2 LV IVS:        1.00 cm   LV e' lateral:   11.00 cm/s LVOT diam:     2.50 cm   LV E/e' lateral: 9.5 LV SV:         96 LV SV Index:   38 LVOT Area:     4.91 cm   RIGHT  VENTRICLE RV S prime:     9.48 cm/s TAPSE (M-mode): 1.5 cm  LEFT ATRIUM              Index        RIGHT ATRIUM           Index LA diam:        5.90 cm  2.35 cm/m   RA Area:     29.50 cm LA Vol (A2C):   147.0 ml 58.63 ml/m  RA Volume:   106.00 ml 42.28 ml/m LA Vol (A4C):   138.0 ml 55.04 ml/m LA Biplane Vol: 154.0 ml 61.43 ml/m AORTIC VALVE AV Area (Vmax):    1.41 cm AV Area (Vmean):   1.35 cm AV Area (VTI):     1.27 cm AV Vmax:           415.00 cm/s AV Vmean:          276.000 cm/s AV VTI:            0.758 m AV Peak Grad:      68.9 mmHg AV Mean Grad:      34.5 mmHg LVOT Vmax:         119.00 cm/s LVOT Vmean:        75.800 cm/s LVOT VTI:          0.196 m LVOT/AV VTI ratio: 0.26  AORTA Ao Root diam: 3.90 cm Ao Asc diam:  4.00 cm  MITRAL VALVE                TRICUSPID VALVE MV Area (PHT): 2.90 cm     TR Peak grad:   28.5 mmHg MV Decel Time: 262 msec     TR Vmax:        267.00 cm/s MR Peak grad: 98.0 mmHg MR Vmax:      495.00 cm/s   SHUNTS MV E velocity: 104.00 cm/s  Systemic VTI:  0.20 m Systemic Diam: 2.50 cm  Vishnu Priya Mallipeddi Electronically signed by Winfield Rast Mallipeddi Signature Date/Time: 10/26/2022/1:40:59 PM    Final    MONITORS  LONG TERM MONITOR (3-14 DAYS) 09/12/2022  Narrative Patch Wear Time:  13 days and 23 hours (2023-11-28T14:12:11-498 to 2023-12-12T14:11:55-0500)  Impression:  Atrial fibrillation  Rates 34 to 148 bpm   Average HR 71 bpm 21 pauses   Longest 9.8 Occasional PVCs   2 bursts of VT   Longest 14.4 sec at 117 bpm, fastest for 6 beats at 148 bpm    2 Ventricular Tachycardia runs occurred, the run with the fastest interval lasting 6 beats with a max rate of 148 bpm, the longest lasting 14.4 secs with an avg rate of 117 bpm. Bundle Branch Block/IVCD was present. Atrial Fibrillation occurred continuously (100% burden), ranging from 34-108 bpm (avg of 71 bpm). 21 Pauses occurred, the longest lasting 9.8 secs (6 bpm). Isolated  VEs were rare (<1.0%, 7087), VE Couplets were rare (<1.0%, 766), and VE Triplets were rare (<1.0%, 1). MD notification criteria for Slow Atrial Fibrillation and Pauses met - report posted prior to notification per account request (SW).            EKG:  EKG is not ordered today.    Recent Labs: 10/26/2022: Magnesium 2.5; TSH 0.902 11/01/2022: Brain Natriuretic Peptide 42 12/07/2022: ALT 17; Platelets 232 01/09/2023: BUN 19; Creatinine, Ser 0.80; Hemoglobin 16.3; Potassium 4.5; Sodium 141  Recent Lipid Panel    Component Value Date/Time   CHOL 146 12/07/2022 0814   TRIG 77 12/07/2022 0814   HDL 59 12/07/2022 0814   CHOLHDL 2.5 12/07/2022 0814   VLDL 8 02/27/2022 0424   LDLCALC 71 12/07/2022 0814     Risk Assessment/Calculations:    CHA2DS2-VASc Score = 5   This indicates a 7.2% annual risk of stroke. The patient's score is based upon: CHF History: 1 HTN History: 1 Diabetes History: 1 Stroke History: 0 Vascular Disease History: 1 Age Score: 1 Gender Score: 0               Physical Exam:    VS:  BP 122/60   Pulse 76   Ht 6\' 1"  (1.854 m)   Wt 284 lb 9.6 oz (129.1 kg)   SpO2 90%   BMI 37.55 kg/m     Wt Readings from Last 3 Encounters:  01/11/23 284 lb 9.6 oz (129.1 kg)  01/09/23 279 lb (126.6 kg)  12/24/22 282 lb (127.9 kg)     GEN:  Well nourished, well developed obese male in no acute distress HEENT: Normal NECK: No JVD; No carotid bruits LYMPHATICS: No lymphadenopathy CARDIAC: RRR, distant heart sounds with grade 3/6 late peaking crescendo decrescendo murmur best heard at the left lower sternal border with absent A2 RESPIRATORY:  Clear to auscultation without rales, wheezing or rhonchi  ABDOMEN: Soft, non-tender, non-distended MUSCULOSKELETAL:  No edema; No deformity  SKIN: Warm and dry NEUROLOGIC:  Alert and oriented x 3 PSYCHIATRIC:  Normal affect   ASSESSMENT:    1. Nonrheumatic aortic valve stenosis   2. Persistent atrial fibrillation   3.  Chronic diastolic heart failure   4. Pre-procedural cardiovascular examination    PLAN:    In order of problems listed above:  The patient has severe, stage D1 aortic stenosis, likely contributing to his episodes of acute on chronic heart failure with preserved ejection fraction requiring hospitalization over recent years.  I have personally reviewed his most recent echo which has difficult acoustic windows.  The aortic valve is difficult to visualize but is clearly calcified and restricted.  The peak transaortic velocity is 4.2 m/s with peak and mean transvalvular gradients of 69 and 37 mmHg, respectively.  Dimensionless index is 0.29 and calculated aortic valve area is 1.28.  I reviewed the natural history of severe aortic stenosis with the patient today and discussed specific treatment options in light of his comorbid medical conditions.  These include obesity, chronic tobacco use, peripheral arterial disease, chronic lower extremity venous disease, persistent atrial fibrillation, type 2 diabetes, and diastolic heart failure.  While his valve area calculation is greater than 1 cm, his clinical symptoms, exam, and other echo features support a diagnosis of severe aortic stenosis.  Further testing is indicated and I have recommended a CTA of the chest, abdomen, and pelvis as well as a gated cardiac CTA to provide a calcium score of the aortic valve and assess  anatomic feasibility of TAVR.  I also have recommended a right and left heart catheterization to better define his hemodynamics, assess for obstructive coronary artery disease, and also provide further information on degree of aortic stenosis. I have reviewed the risks, indications, and alternatives to cardiac catheterization, possible angioplasty, and stenting with the patient. Risks include but are not limited to bleeding, infection, vascular injury, stroke, myocardial infection, arrhythmia, kidney injury, radiation-related injury in the case of  prolonged fluoroscopy use, emergency cardiac surgery, and death. The patient understands the risks of serious complication is 1-2 in 1000 with diagnostic cardiac cath and 1-2% or less with angioplasty/stenting.  Once the studies are completed, the patient will be referred for formal cardiac surgical consultation as part of a multidisciplinary approach to his care.  In reviewing his records, he was just cardioverted 2 days ago, so his cardiac catheterization will have to be delayed for 4 weeks from the time of his cardioversion before interrupting his anticoagulation.  We will try to arrange his CAT scans to be performed first.  I will reach out to Dr. Graciela Husbands to review the use of flecainide considering his severe aortic stenosis.      Shared Decision Making/Informed Consent The risks [stroke (1 in 1000), death (1 in 1000), kidney failure [usually temporary] (1 in 500), bleeding (1 in 200), allergic reaction [possibly serious] (1 in 200)], benefits (diagnostic support and management of coronary artery disease) and alternatives of a cardiac catheterization were discussed in detail with Steven Henry and he is willing to proceed.    Medication Adjustments/Labs and Tests Ordered: Current medicines are reviewed at length with the patient today.  Concerns regarding medicines are outlined above.  Orders Placed This Encounter  Procedures   Basic metabolic panel   CBC   No orders of the defined types were placed in this encounter.   Patient Instructions  Medication Instructions:  Your physician recommends that you continue on your current medications as directed. Please refer to the Current Medication list given to you today.  *If you need a refill on your cardiac medications before your next appointment, please call your pharmacy*   Lab Work: CBC, BMET today If you have labs (blood work) drawn today and your tests are completely normal, you will receive your results only by: MyChart Message (if you  have MyChart) OR A paper copy in the mail If you have any lab test that is abnormal or we need to change your treatment, we will call you to review the results.   Testing/Procedures: R & L Heart Catheterization Your physician has requested that you have a cardiac catheterization. Cardiac catheterization is used to diagnose and/or treat various heart conditions. Doctors may recommend this procedure for a number of different reasons. The most common reason is to evaluate chest pain. Chest pain can be a symptom of coronary artery disease (CAD), and cardiac catheterization can show whether plaque is narrowing or blocking your heart's arteries. This procedure is also used to evaluate the valves, as well as measure the blood flow and oxygen levels in different parts of your heart. For further information please visit [email or web address] Please follow instruction sheet, as given.  TAVR CT's (you will be called to schedule)  Follow-Up: At [facility], you and your health needs are our priority.  As part of our continuing mission to provide you with exceptional heart care, we have created designated Provider Care Teams.  These Care Teams include your primary Cardiologist (physician) and Advanced Practice Providers (  APPs -  Physician Assistants and Nurse Practitioners) who all work together to provide you with the care you need, when you need it.  Your next appointment:   Structural Team will follow-up  Provider:   [name], MD     Other Instructions       Cardiac/Peripheral Catheterization   You are scheduled for a Cardiac Catheterization on MONDAY, May 20 with Dr. [name].  1. Please arrive at the [facility] (Main Entrance A) at [facility]: [address] [place], [place] [address] at 7:00 AM (This time is two hours before your procedure to ensure your preparation). Free valet parking service is available. You will check in at ADMITTING. The support person will  be asked to wait in the waiting room.  It is OK to have someone drop you off and come back when you are ready to be discharged.        Special note: Every effort is made to have your procedure done on time. Please understand that emergencies sometimes delay scheduled procedures.  2. Diet: Do not eat solid foods after midnight.  You may have clear liquids until 5 AM the day of the procedure.  3. Labs: TODAY  4. Medication instructions in preparation for your procedure:   Contrast Allergy: No  DO NOT TAKE Eliquis for 2 days prior to procedure, may resume day after (last dose Friday 5/17, resume 5/21)  TAKE 1/2 usual dose of Lantus insulin the night before procedure (12.5 units)  DO NOT TAKE Metformin the day of procedure, and hold for 2 days afterwards (resume Thursday 5/23)  DO NOT TAKE Furosemide or Spironolactone the day of procedure  On the morning of your procedure, take Aspirin 81 mg and any morning medicines NOT listed above.  You may use sips of water.  5. Plan to go home the same day, you will only stay overnight if medically necessary. 6. You MUST have a responsible adult to drive you home. 7. An adult MUST be with you the first 24 hours after you arrive home. 8. Bring a current list of your medications, and the last time and date medication taken. 9. Bring ID and current insurance cards. 10.Please wear clothes that are easy to get on and off and wear slip-on shoes.  Thank you for allowing [place] to care for you!   -- [facility] In[facility] Cardiovascular services    Signed, [name], MD  01/11/2023 11:18 AM    [facility]

## 2023-01-11 NOTE — Progress Notes (Addendum)
Pre Surgical Assessment: 5 M Walk Test  61M=16.40ft  5 Meter Walk Test- trial 1: 5.31 seconds 5 Meter Walk Test- trial 2: 4.71 seconds 5 Meter Walk Test- trial 3: 6.42 seconds 5 Meter Walk Test Average: 5.48 seconds   STS surgical risk calculation:   Procedure Type: Isolated AVR PERIOPERATIVE OUTCOME ESTIMATE % Operative Mortality 1.79% Morbidity & Mortality 9.55% Stroke 1% Renal Failure 1.51% Reoperation 3.04% Prolonged Ventilation 5.04% Deep Sternal Wound Infection 0.243% Long Hospital Stay (>14 days) 3.33% Short Hospital Stay (<6 days)* 47.3%

## 2023-01-14 ENCOUNTER — Telehealth: Payer: Self-pay | Admitting: *Deleted

## 2023-01-14 ENCOUNTER — Encounter (HOSPITAL_COMMUNITY): Admission: RE | Admit: 2023-01-14 | Payer: Medicare Other | Source: Ambulatory Visit

## 2023-01-14 NOTE — Telephone Encounter (Signed)
-----   Message from Lillia Mountain, RN sent at 01/11/2023  1:53 PM EDT ----- Regarding: Mr Campanaro needs to have his heart cath 1st and get cardiac clearance before his colonoscopy Duwaine Maxin,  I think cardiology saw him today and scheduled the Heart Cath..  Dr Alva Garnet needs the heart cath 1st and get a cardiac clearance  prior to his colonoscopy.  Thanks,  Cliffton Asters RN

## 2023-01-14 NOTE — Telephone Encounter (Signed)
Called pt. He is aware procedure cancelled for now. He voiced understanding. FYI to [employer]

## 2023-01-15 ENCOUNTER — Other Ambulatory Visit: Payer: Self-pay

## 2023-01-15 DIAGNOSIS — I350 Nonrheumatic aortic (valve) stenosis: Secondary | ICD-10-CM

## 2023-01-16 ENCOUNTER — Encounter (HOSPITAL_COMMUNITY): Admission: RE | Payer: Self-pay | Source: Home / Self Care

## 2023-01-16 ENCOUNTER — Ambulatory Visit (HOSPITAL_COMMUNITY): Admission: RE | Admit: 2023-01-16 | Payer: Medicare Other | Source: Home / Self Care | Admitting: Internal Medicine

## 2023-01-16 SURGERY — COLONOSCOPY WITH PROPOFOL
Anesthesia: Monitor Anesthesia Care

## 2023-01-21 ENCOUNTER — Ambulatory Visit (HOSPITAL_COMMUNITY)
Admission: RE | Admit: 2023-01-21 | Discharge: 2023-01-21 | Disposition: A | Discharge status: Home / Self Care | Payer: Medicare Other | Source: Ambulatory Visit | Attending: Cardiovascular Disease | Admitting: Cardiovascular Disease

## 2023-01-21 DIAGNOSIS — I350 Nonrheumatic aortic (valve) stenosis: Secondary | ICD-10-CM

## 2023-01-21 DIAGNOSIS — I4891 Unspecified atrial fibrillation: Secondary | ICD-10-CM | POA: Diagnosis not present

## 2023-01-21 MED ORDER — IOHEXOL 350 MG/ML SOLN
100.0000 mL | Freq: Once | INTRAVENOUS | Status: AC | PRN
  Administered 2023-01-21: 100 mL via INTRAVENOUS

## 2023-01-25 DIAGNOSIS — I5032 Chronic diastolic (congestive) heart failure: Secondary | ICD-10-CM | POA: Diagnosis not present

## 2023-01-25 DIAGNOSIS — I4891 Unspecified atrial fibrillation: Secondary | ICD-10-CM | POA: Diagnosis not present

## 2023-01-25 DIAGNOSIS — J449 Chronic obstructive pulmonary disease, unspecified: Secondary | ICD-10-CM | POA: Diagnosis not present

## 2023-01-25 DIAGNOSIS — Z515 Encounter for palliative care: Secondary | ICD-10-CM | POA: Diagnosis not present

## 2023-01-25 DIAGNOSIS — R0600 Dyspnea, unspecified: Secondary | ICD-10-CM | POA: Diagnosis not present

## 2023-01-25 DIAGNOSIS — I350 Nonrheumatic aortic (valve) stenosis: Secondary | ICD-10-CM | POA: Diagnosis not present

## 2023-01-25 DIAGNOSIS — R5383 Other fatigue: Secondary | ICD-10-CM | POA: Diagnosis not present

## 2023-01-27 ENCOUNTER — Other Ambulatory Visit: Payer: Self-pay | Admitting: Family Medicine

## 2023-01-27 DIAGNOSIS — E1100 Type 2 diabetes mellitus with hyperosmolarity without nonketotic hyperglycemic-hyperosmolar coma (NKHHC): Secondary | ICD-10-CM

## 2023-01-27 DIAGNOSIS — N183 Chronic kidney disease, stage 3 unspecified: Secondary | ICD-10-CM

## 2023-01-28 ENCOUNTER — Other Ambulatory Visit: Payer: Self-pay | Admitting: Family Medicine

## 2023-01-28 NOTE — Telephone Encounter (Signed)
Requested Prescriptions  Pending Prescriptions Disp Refills   Continuous Glucose Sensor (FREESTYLE LIBRE 2 SENSOR) MISC [Pharmacy Med Name: FreeStyle Libre 2 Sensor kit] 2 each 3    Sig: APPLY 1 SENSOR ON THE SKIN EVERY 14 DAYS, USE AS DIRECTED TO CHECK BLOOD SUGAR     Endocrinology: Diabetes - Testing Supplies Failed - 01/27/2023  8:00 AM      Failed - Valid encounter within last 12 months    Recent Outpatient Visits           1 year ago Acute pain of left knee   [facility] Donita Brooks, MD   1 year ago Rib pain on left side   [facility] Pickard, Priscille Heidelberg, MD   1 year ago Rib pain on left side   [facility] Valentino Nose, NP   1 year ago Uncontrolled type 2 diabetes mellitus with hyperosmolarity without coma, without long-term current use of insulin (HCC)   [facility] Donita Brooks, MD   2 years ago Acute pulmonary edema (HCC)   [facility] Pickard, Priscille Heidelberg, MD       Future Appointments             In 1 month Wert, Charlaine Dalton, MD [facility]   In 4 months Sharlene Dory, NP [facility] [place], [place]

## 2023-01-29 ENCOUNTER — Other Ambulatory Visit: Payer: Self-pay | Admitting: Family Medicine

## 2023-01-29 DIAGNOSIS — I480 Paroxysmal atrial fibrillation: Secondary | ICD-10-CM

## 2023-01-29 NOTE — Progress Notes (Signed)
Remote pacemaker transmission.   

## 2023-01-29 NOTE — Telephone Encounter (Signed)
Requested Prescriptions  Refused Prescriptions Disp Refills   losartan (COZAAR) 50 MG tablet [Pharmacy Med Name: losartan 50 mg tablet] 90 tablet 3    Sig: TAKE 1 TABLET BY MOUTH DAILY     Cardiovascular:  Angiotensin Receptor Blockers Failed - 01/28/2023  8:03 AM      Failed - Valid encounter within last 6 months    Recent Outpatient Visits           1 year ago Acute pain of left knee   [facility] Donita Brooks, MD   1 year ago Rib pain on left side   [facility] Pickard, Priscille Heidelberg, MD   1 year ago Rib pain on left side   [facility] Valentino Nose, NP   1 year ago Uncontrolled type 2 diabetes mellitus with hyperosmolarity without coma, without long-term current use of insulin (HCC)   [facility] Donita Brooks, MD   2 years ago Acute pulmonary edema (HCC)   [facility] Donita Brooks, MD       Future Appointments             In 1 month Sherene Sires, Charlaine Dalton, MD [facility]   In 4 months Sharlene Dory, NP [facility] [place], [place]            Passed - Cr in normal range and within 180 days    Creat  Date Value Ref Range Status  12/07/2022 0.93 0.70 - 1.28 mg/dL Final   Creatinine, Ser  Date Value Ref Range Status  01/09/2023 0.80 0.61 - 1.24 mg/dL Final   Creatinine, Urine  Date Value Ref Range Status  08/22/2022 37 20 - 320 mg/dL Final         Passed - K in normal range and within 180 days    Potassium  Date Value Ref Range Status  01/09/2023 4.5 3.5 - 5.1 mmol/L Final         Passed - Patient is not pregnant      Passed - Last BP in normal range    BP Readings from Last 1 Encounters:  01/21/23 98/66

## 2023-01-31 DIAGNOSIS — J9601 Acute respiratory failure with hypoxia: Secondary | ICD-10-CM | POA: Diagnosis not present

## 2023-02-05 ENCOUNTER — Ambulatory Visit (HOSPITAL_COMMUNITY)
Admission: RE | Admit: 2023-02-05 | Discharge: 2023-02-05 | Disposition: A | Discharge status: Home / Self Care | Payer: Medicare Other | Source: Ambulatory Visit | Attending: Internal Medicine | Admitting: Internal Medicine

## 2023-02-05 DIAGNOSIS — J441 Chronic obstructive pulmonary disease with (acute) exacerbation: Secondary | ICD-10-CM | POA: Diagnosis not present

## 2023-02-05 LAB — PULMONARY FUNCTION TEST
DL/VA % pred: 90 %
DL/VA: 3.57 ml/min/mmHg/L
DLCO unc % pred: 75 %
DLCO unc: 21.21 ml/min/mmHg
FEF 25-75 Post: 1.4 L/sec
FEF 25-75 Pre: 1.88 L/sec
FEF2575-%Change-Post: -25 %
FEF2575-%Pred-Post: 51 %
FEF2575-%Pred-Pre: 69 %
FEV1-%Change-Post: -3 %
FEV1-%Pred-Post: 73 %
FEV1-%Pred-Pre: 76 %
FEV1-Post: 2.66 L
FEV1-Pre: 2.76 L
FEV1FVC-%Change-Post: 4 %
FEV1FVC-%Pred-Pre: 98 %
FEV6-%Change-Post: -6 %
FEV6-%Pred-Post: 74 %
FEV6-%Pred-Pre: 79 %
FEV6-Post: 3.46 L
FEV6-Pre: 3.68 L
FEV6FVC-%Change-Post: 2 %
FEV6FVC-%Pred-Post: 104 %
FEV6FVC-%Pred-Pre: 101 %
FVC-%Change-Post: -8 %
FVC-%Pred-Post: 71 %
FVC-%Pred-Pre: 78 %
FVC-Post: 3.51 L
FVC-Pre: 3.83 L
Post FEV1/FVC ratio: 76 %
Post FEV6/FVC ratio: 99 %
Pre FEV1/FVC ratio: 72 %
Pre FEV6/FVC Ratio: 96 %
RV % pred: 96 %
RV: 2.55 L
TLC % pred: 84 %
TLC: 6.48 L

## 2023-02-05 MED ORDER — ALBUTEROL SULFATE (2.5 MG/3ML) 0.083% IN NEBU
2.5000 mg | INHALATION_SOLUTION | Freq: Once | RESPIRATORY_TRACT | Status: AC
  Administered 2023-02-05: 2.5 mg via RESPIRATORY_TRACT

## 2023-02-06 ENCOUNTER — Other Ambulatory Visit: Payer: Self-pay

## 2023-02-06 ENCOUNTER — Encounter (HOSPITAL_COMMUNITY): Payer: Self-pay | Admitting: Physician Assistant

## 2023-02-06 ENCOUNTER — Ambulatory Visit (HOSPITAL_COMMUNITY)
Admission: RE | Admit: 2023-02-06 | Discharge: 2023-02-06 | Disposition: A | Discharge status: Home / Self Care | Payer: Medicare Other | Source: Ambulatory Visit | Attending: Physician Assistant | Admitting: Physician Assistant

## 2023-02-06 VITALS — BP 120/82 | HR 73 | Wt 282.8 lb

## 2023-02-06 DIAGNOSIS — D6869 Other thrombophilia: Secondary | ICD-10-CM

## 2023-02-06 DIAGNOSIS — I4819 Other persistent atrial fibrillation: Secondary | ICD-10-CM | POA: Diagnosis not present

## 2023-02-06 DIAGNOSIS — I2510 Atherosclerotic heart disease of native coronary artery without angina pectoris: Secondary | ICD-10-CM | POA: Insufficient documentation

## 2023-02-06 DIAGNOSIS — Z01810 Encounter for preprocedural cardiovascular examination: Secondary | ICD-10-CM | POA: Diagnosis not present

## 2023-02-06 DIAGNOSIS — I5032 Chronic diastolic (congestive) heart failure: Secondary | ICD-10-CM | POA: Insufficient documentation

## 2023-02-06 DIAGNOSIS — D6859 Other primary thrombophilia: Secondary | ICD-10-CM | POA: Diagnosis not present

## 2023-02-06 DIAGNOSIS — Z6837 Body mass index (BMI) 37.0-37.9, adult: Secondary | ICD-10-CM | POA: Insufficient documentation

## 2023-02-06 DIAGNOSIS — I110 Hypertensive heart disease with heart failure: Secondary | ICD-10-CM | POA: Insufficient documentation

## 2023-02-06 DIAGNOSIS — Z79899 Other long term (current) drug therapy: Secondary | ICD-10-CM | POA: Diagnosis not present

## 2023-02-06 DIAGNOSIS — G4733 Obstructive sleep apnea (adult) (pediatric): Secondary | ICD-10-CM | POA: Diagnosis not present

## 2023-02-06 DIAGNOSIS — Z5181 Encounter for therapeutic drug level monitoring: Secondary | ICD-10-CM

## 2023-02-06 DIAGNOSIS — E119 Type 2 diabetes mellitus without complications: Secondary | ICD-10-CM | POA: Insufficient documentation

## 2023-02-06 DIAGNOSIS — F17210 Nicotine dependence, cigarettes, uncomplicated: Secondary | ICD-10-CM | POA: Insufficient documentation

## 2023-02-06 DIAGNOSIS — I350 Nonrheumatic aortic (valve) stenosis: Secondary | ICD-10-CM | POA: Diagnosis not present

## 2023-02-06 DIAGNOSIS — E669 Obesity, unspecified: Secondary | ICD-10-CM | POA: Insufficient documentation

## 2023-02-06 DIAGNOSIS — I495 Sick sinus syndrome: Secondary | ICD-10-CM | POA: Insufficient documentation

## 2023-02-06 NOTE — H&P (View-Only) (Signed)
  Primary Care Physician: Pickard, Warren T, MD Primary Cardiologist: Dr Mallipeddi Primary Electrophysiologist: Dr Klein Referring Physician: Dr Klein   Steven Henry is a 72 y.o. male with a history of HTN, COPD, OSA, DM, tobacco abuse, HFpEF, aortic dilatation, atrial fibrillation who presents for follow up in the [facility].  Pt was in [place] in 2023, and presented to local hospital with CHF and SOB, found to be in A-fib. Was diuresed and left AMA. In June 2023 presented to [facility] with increased abdominal swelling and orthopnea. Treated with IV Lasix, echo revealed EF 65 to 70%. Was sent home on p.o. Lasix and Eliquis. Patient is on Eliquis for a CHADS2VASC score of 5. He was noted to have a slow ventricular rate and a Zio monitor was placed which showed several pauses, up to 9.8 seconds. He underwent PPM insertion with Dr Klein on 09/14/22.   He was hospitalized again for decompensated CHF and possible COPD exacerbation, first at [facility] and then was readmitted the next day at [facility]. He did require BiPap for acute respiratory failure. He was discharged but his DCCV was postponed. He underwent DCCV on 11/05/22.  Patient was back in persistent atrial fibrillation at his visit with Dr Klein on 12/24/22, started on flecainide and underwent repeat DCCV on 01/09/23.  On follow up today, patient reports that he feels "great". He remains in SR today. No bleeding issues on anticoagulation. No symptoms of fluid overload.   Today, he denies symptoms of palpitations, chest pain, orthopnea, PND, lower extremity edema, dizziness, presyncope, syncope, snoring, daytime somnolence, bleeding, or neurologic sequela. The patient is tolerating medications without difficulties and is otherwise without complaint today.    Atrial Fibrillation Risk Factors:  he does have symptoms or diagnosis of sleep apnea. he is compliant with CPAP therapy. he does not have a  history of rheumatic fever.   he has a BMI of Body mass index is 37.31 kg/m.. Filed Weights   02/06/23 1439  Weight: 128.3 kg     Family History  Problem Relation Age of Onset   Colon cancer Mother    Diabetes Mother    Heart disease Mother    Diabetes Sister    Diabetes Sister    Diabetes Sister      Atrial Fibrillation Management history:  Previous antiarrhythmic drugs: flecainide  Previous cardioversions: 11/05/22, 01/09/23 Previous ablations: none CHADS2VASC score: 5 Anticoagulation history: Eliquis   Past Medical History:  Diagnosis Date   A-fib (HCC)    CHF (congestive heart failure) (HCC)    CKD (chronic kidney disease), stage III (HCC)    Colon polyps    Diabetes mellitus type II, uncontrolled    Diabetes mellitus without complication (HCC)    Hypertension    Morbid obesity (HCC)    PVD (peripheral vascular disease) (HCC)    right femoral artery   Sleep apnea    not on CPAP    Sleep apnea    Past Surgical History:  Procedure Laterality Date   CARDIOVERSION N/A 11/05/2022   Procedure: CARDIOVERSION;  Surgeon: Branch, Mary E, MD;  Location: [facility] ENDOSCOPY;  Service: Cardiovascular;  Laterality: N/A;   CARDIOVERSION N/A 01/09/2023   Procedure: CARDIOVERSION;  Surgeon: Skains, Mark C, MD;  Location: [facility] INVASIVE CV LAB;  Service: Cardiovascular;  Laterality: N/A;   COLONOSCOPY N/A 09/16/2015   Procedure: COLONOSCOPY;  Surgeon: Robert M Rourk, MD;  Location: [facility] ENDO SUITE;  Service: Endoscopy;  Laterality: N/A;    8:15 AM - pt has c pap   ENDOVENOUS ABLATION SAPHENOUS VEIN W/ LASER Right 08/26/2017   endovenous laser ablation right greater saphenous vein by James Lawson MD    PACEMAKER IMPLANT N/A 09/14/2022   Procedure: PACEMAKER IMPLANT;  Surgeon: Klein, Steven C, MD;  Location: [facility] INVASIVE CV LAB;  Service: Cardiovascular;  Laterality: N/A;   stab phlebectomy  Right 01/07/2018   stab phlebectomy 10-20 incisions right leg by James Lawson MD     Current  Outpatient Medications  Medication Sig Dispense Refill   Accu-Chek FastClix Lancets MISC Check BS QAM - DX: E11.9 100 each 3   albuterol (VENTOLIN HFA) 108 (90 Base) MCG/ACT inhaler Inhale 2 puffs into the lungs every 6 (six) hours as needed for wheezing or shortness of breath. 8 g 0   atorvastatin (LIPITOR) 40 MG tablet TAKE 1 TABLET BY MOUTH DAILY (Patient taking differently: Take 40 mg by mouth at bedtime.) 90 tablet 3   bisoprolol (ZEBETA) 5 MG tablet Take 1 tablet (5 mg total) by mouth daily. (Patient taking differently: Take 5 mg by mouth at bedtime.) 90 tablet 3   Blood Glucose Monitoring Suppl (ONE TOUCH ULTRA 2) w/Device KIT USETO CHECK BLOOD SUGAR EVERY MORNING 1 kit 0   budesonide-formoterol (SYMBICORT) 160-4.5 MCG/ACT inhaler Inhale 2 puffs into the lungs in the morning and at bedtime. (Patient taking differently: Inhale 2 puffs into the lungs daily.) 1 each 12   Continuous Blood Gluc Receiver (FREESTYLE LIBRE 2 READER) DEVI For continuous blood glucose monitoring 1 each 1   Continuous Glucose Sensor (FREESTYLE LIBRE 2 SENSOR) MISC APPLY 1 SENSOR ON THE SKIN EVERY 14 DAYS, USE AS DIRECTED TO CHECK BLOOD SUGAR 2 each 3   ELIQUIS 5 MG TABS tablet TAKE 1 TABLET BY MOUTH TWICE DAILY 60 tablet 3   empagliflozin (JARDIANCE) 25 MG TABS tablet TAKE 1 TABLET BY MOUTH DAILY BEFORE breakfast 90 tablet 0   flecainide (TAMBOCOR) 100 MG tablet Take 1 tablet (100 mg total) by mouth 2 (two) times daily. 180 tablet 3   furosemide (LASIX) 40 MG tablet Take 2 tablets (80 mg total) by mouth 2 (two) times daily. 180 tablet 1   insulin glargine (LANTUS) 100 UNIT/ML injection Inject 0.25 mLs (25 Units total) into the skin daily. PLEASE INCREASE INSULIN BY 1 UNIT DAILY UNTIL FBG IS <130, THEN HOLD AT THAT DOSE 30 mL 5   ipratropium-albuterol (DUONEB) 0.5-2.5 (3) MG/3ML SOLN Take 3 mLs by nebulization 3 (three) times daily. (Patient taking differently: Take 3 mLs by nebulization every 4 (four) hours as needed  (shortness of breath).) 360 mL 1   Lancets Misc. (ACCU-CHEK FASTCLIX LANCET) KIT Check BS QAM - DX: E11.9 1 kit 0   metFORMIN (GLUCOPHAGE) 1000 MG tablet TAKE 1 TABLET BY MOUTH TWICE DAILY WITH a meal 180 tablet 3   Multiple Vitamin (MULTIVITAMIN) capsule Take 1 capsule by mouth in the morning. Mens one a day     ONETOUCH ULTRA test strip USE TO CHECK BLOOD SUGAR EVERY MORNING 100 strip 12   Semaglutide, 2 MG/DOSE, 8 MG/3ML SOPN Inject 2 mg as directed once a week. 3 mL 3   spironolactone (ALDACTONE) 25 MG tablet TAKE 1 TABLET BY MOUTH DAILY 30 tablet 1   Zinc 30 MG CAPS Take 30 mg by mouth daily.     No current facility-administered medications for this encounter.    No Known Allergies  Social History   Socioeconomic History   Marital status: Divorced      Spouse name: Not on file   Number of children: 2   Years of education: Not on file   Highest education level: Not on file  Occupational History   Occupation: Self Employed  Tobacco Use   Smoking status: Some Days    Packs/day: 1.50    Years: 30.00    Additional pack years: 0.00    Total pack years: 45.00    Types: Cigarettes    Start date: 04/04/1958   Smokeless tobacco: Never   Tobacco comments:    Quit smoking day of DCCV 11/07/22    Started back due to stress - 1/2 pack per day - trying to cut back down. - 12/17/2022  Vaping Use   Vaping Use: Never used  Substance and Sexual Activity   Alcohol use: Yes    Alcohol/week: 1.0 standard drink of alcohol    Types: 1 Cans of beer per week    Comment: 1 beer once a month 10/12/22   Drug use: No   Sexual activity: Not on file    Comment: divorced, 2 daughters.  works selling concessions for fairs, etc.  Other Topics Concern   Not on file  Social History Narrative   Pt states he usually smokes after eating or when stressed   Social Determinants of Health   Financial Resource Strain: Low Risk  (11/16/2022)   Overall Financial Resource Strain (CARDIA)    Difficulty of  Paying Living Expenses: Not very hard  Food Insecurity: No Food Insecurity (11/16/2022)   Hunger Vital Sign    Worried About Running Out of Food in the Last Year: Never true    Ran Out of Food in the Last Year: Never true  Transportation Needs: No Transportation Needs (10/26/2022)   PRAPARE - Transportation    Lack of Transportation (Medical): No    Lack of Transportation (Non-Medical): No  Physical Activity: Sufficiently Active (09/06/2022)   Exercise Vital Sign    Days of Exercise per Week: 5 days    Minutes of Exercise per Session: 30 min  Stress: No Stress Concern Present (09/06/2022)   [employer] - Occupational Stress Questionnaire    Feeling of Stress : Not at all  Social Connections: Socially Isolated (09/06/2022)   Social Connection and Isolation Panel [NHANES]    Frequency of Communication with Friends and Family: More than three times a week    Frequency of Social Gatherings with Friends and Family: Three times a week    Attends Religious Services: Never    Active Member of Clubs or Organizations: No    Attends Club or Organization Meetings: Never    Marital Status: Divorced  Intimate Partner Violence: Not At Risk (10/26/2022)   Humiliation, Afraid, Rape, and Kick questionnaire    Fear of Current or Ex-Partner: No    Emotionally Abused: No    Physically Abused: No    Sexually Abused: No     ROS- All systems are reviewed and negative except as per the HPI above.  Physical Exam: Vitals:   02/06/23 1439  BP: 120/82  Pulse: 73  Weight: 128.3 kg    GEN- The patient is a well appearing male, alert and oriented x 3 today.   HEENT-head normocephalic, atraumatic, sclera clear, conjunctiva pink, hearing intact, trachea midline. Lungs- Clear to ausculation bilaterally, normal work of breathing Heart- Regular rate and rhythm, no rubs or gallops, 3/6 systolic murmur GI- soft, NT, ND, + BS Extremities- no clubbing, cyanosis, or edema MS- no  significant   deformity or atrophy Skin- no rash or lesion Psych- euthymic mood, full affect Neuro- strength and sensation are intact   Wt Readings from Last 3 Encounters:  02/06/23 128.3 kg  01/11/23 129.1 kg  01/09/23 126.6 kg    EKG today demonstrates  A sense V paced rhythm Vent. rate 73 BPM PR interval 234 ms QRS duration 118 ms QT/QTcB 384/423 ms  Echo 10/26/22 demonstrated   1. Left ventricular ejection fraction, by estimation, is 55 to 60%. The  left ventricle has normal function. The left ventricle has no regional  wall motion abnormalities. Left ventricular diastolic function could not  be evaluated.   2. Right ventricular systolic function is mildly reduced. The right  ventricular size is mildly enlarged. There is mildly elevated pulmonary  artery systolic pressure. The estimated right ventricular systolic  pressure is 43.5 mmHg.   3. Left atrial size was severely dilated.   4. Right atrial size was moderately dilated.   5. The mitral valve is grossly normal. No evidence of mitral valve  regurgitation. No evidence of mitral stenosis.   6. The aortic valve is calcified. There is moderate calcification of the  aortic valve. Aortic valve regurgitation is not visualized. Moderate to  severe aortic valve stenosis. Aortic valve area, by VTI measures 1.27 cm. Aortic valve mean gradient measures 34.5 mmHg. Aortic valve Vmax measures 4.15 m/s.   7. Aortic dilatation noted. There is borderline dilatation of the aortic  root, measuring 39 mm. There is mild dilatation of the ascending aorta,  measuring 40 mm.   8. The inferior vena cava is dilated in size with <50% respiratory  variability, suggesting right atrial pressure of 15 mmHg.   Comparison(s): No significant change from prior study.   Epic records are reviewed at length today  CHA2DS2-VASc Score = 5  The patient's score is based upon: CHF History: 1 HTN History: 1 Diabetes History: 1 Stroke History: 0 Vascular  Disease History: 1 Age Score: 1 Gender Score: 0       ASSESSMENT AND PLAN: 1. Persistent Atrial Fibrillation (ICD10:  I48.19) The patient's CHA2DS2-VASc score is 5, indicating a 7.2% annual risk of stroke.   S/p DCCV 01/09/23 Patient appears to be maintaining SR. Continue flecainide 100 mg BID for now, will review with EP given his coronary calcification on CT.  Continue Eliquis 5 mg BID Continue bisoprolol 5 mg daily  2. Secondary Hypercoagulable State (ICD10:  D68.69) The patient is at significant risk for stroke/thromboembolism based upon his CHA2DS2-VASc Score of 5.  Continue Apixaban (Eliquis).   3. Obesity Body mass index is 37.31 kg/m. Lifestyle modification was discussed and encouraged including regular physical activity and weight reduction. On semaglutide  4. Obstructive sleep apnea Encouraged compliance with CPAP therapy.  5. Sinus node dysfunction S/p PPM, followed by Dr Klein and the device clinic.  6. HTN Stable, no changes today.   7. Chronic HFpEF Patient has been hospitalized several times with acute CHF.  GDMT includes bisoprolol, spironolactone, Jardiance Appears euvolemic today.  8. Aortic stenosis Severe AS Seen by Dr Cooper, undergoing workup for valve surgery.  9. CAD CAC score 447 on CT On statin Cardiac cath scheduled for 02/11/23. Check cbc/bmet today.  Cath instructions printed and given to patient again.    Follow up for cardiac catheterization as scheduled.    Ricky Shamika Pedregon PA-C [facility] [address] [place], [place] [address] [phone] 02/06/2023 3:26 PM 

## 2023-02-06 NOTE — Progress Notes (Addendum)
Primary Care Physician: Donita Brooks, MD Primary Cardiologist: Dr Jenene Slicker Primary Electrophysiologist: Dr Graciela Husbands Referring Physician: Dr Caprice Kluver is a 72 y.o. male with a history of HTN, COPD, OSA, DM, tobacco abuse, HFpEF, aortic dilatation, atrial fibrillation who presents for follow up in the [facility].  Pt was in [place] in 2023, and presented to local hospital with CHF and SOB, found to be in A-fib. Was diuresed and left AMA. In June 2023 presented to [facility] with increased abdominal swelling and orthopnea. Treated with IV Lasix, echo revealed EF 65 to 70%. Was sent home on p.o. Lasix and Eliquis. Patient is on Eliquis for a CHADS2VASC score of 5. He was noted to have a slow ventricular rate and a Zio monitor was placed which showed several pauses, up to 9.8 seconds. He underwent PPM insertion with Dr Graciela Husbands on 09/14/22.   He was hospitalized again for decompensated CHF and possible COPD exacerbation, first at [facility] and then was readmitted the next day at [facility]. He did require BiPap for acute respiratory failure. He was discharged but his DCCV was postponed. He underwent DCCV on 11/05/22.  Patient was back in persistent atrial fibrillation at his visit with Dr Graciela Husbands on 12/24/22, started on flecainide and underwent repeat DCCV on 01/09/23.  On follow up today, patient reports that he feels "great". He remains in SR today. No bleeding issues on anticoagulation. No symptoms of fluid overload.   Today, he denies symptoms of palpitations, chest pain, orthopnea, PND, lower extremity edema, dizziness, presyncope, syncope, snoring, daytime somnolence, bleeding, or neurologic sequela. The patient is tolerating medications without difficulties and is otherwise without complaint today.    Atrial Fibrillation Risk Factors:  he does have symptoms or diagnosis of sleep apnea. he is compliant with CPAP therapy. he does not have a  history of rheumatic fever.   he has a BMI of Body mass index is 37.31 kg/m.Marland Kitchen Filed Weights   02/06/23 1439  Weight: 128.3 kg     Family History  Problem Relation Age of Onset   Colon cancer Mother    Diabetes Mother    Heart disease Mother    Diabetes Sister    Diabetes Sister    Diabetes Sister      Atrial Fibrillation Management history:  Previous antiarrhythmic drugs: flecainide  Previous cardioversions: 11/05/22, 01/09/23 Previous ablations: none CHADS2VASC score: 5 Anticoagulation history: Eliquis   Past Medical History:  Diagnosis Date   A-fib (HCC)    CHF (congestive heart failure) (HCC)    CKD (chronic kidney disease), stage III (HCC)    Colon polyps    Diabetes mellitus type II, uncontrolled    Diabetes mellitus without complication (HCC)    Hypertension    Morbid obesity (HCC)    PVD (peripheral vascular disease) (HCC)    right femoral artery   Sleep apnea    not on CPAP    Sleep apnea    Past Surgical History:  Procedure Laterality Date   CARDIOVERSION N/A 11/05/2022   Procedure: CARDIOVERSION;  Surgeon: Maisie Fus, MD;  Location: [facility] ENDOSCOPY;  Service: Cardiovascular;  Laterality: N/A;   CARDIOVERSION N/A 01/09/2023   Procedure: CARDIOVERSION;  Surgeon: Jake Bathe, MD;  Location: [facility] INVASIVE CV LAB;  Service: Cardiovascular;  Laterality: N/A;   COLONOSCOPY N/A 09/16/2015   Procedure: COLONOSCOPY;  Surgeon: Corbin Ade, MD;  Location: [facility] ENDO SUITE;  Service: Endoscopy;  Laterality: N/A;  8:15 AM - pt has c pap   ENDOVENOUS ABLATION SAPHENOUS VEIN W/ LASER Right 08/26/2017   endovenous laser ablation right greater saphenous vein by Josephina Gip MD    PACEMAKER IMPLANT N/A 09/14/2022   Procedure: PACEMAKER IMPLANT;  Surgeon: Duke Salvia, MD;  Location: [facility] INVASIVE CV LAB;  Service: Cardiovascular;  Laterality: N/A;   stab phlebectomy  Right 01/07/2018   stab phlebectomy 10-20 incisions right leg by Josephina Gip MD     Current  Outpatient Medications  Medication Sig Dispense Refill   Accu-Chek FastClix Lancets MISC Check BS QAM - DX: E11.9 100 each 3   albuterol (VENTOLIN HFA) 108 (90 Base) MCG/ACT inhaler Inhale 2 puffs into the lungs every 6 (six) hours as needed for wheezing or shortness of breath. 8 g 0   atorvastatin (LIPITOR) 40 MG tablet TAKE 1 TABLET BY MOUTH DAILY (Patient taking differently: Take 40 mg by mouth at bedtime.) 90 tablet 3   bisoprolol (ZEBETA) 5 MG tablet Take 1 tablet (5 mg total) by mouth daily. (Patient taking differently: Take 5 mg by mouth at bedtime.) 90 tablet 3   Blood Glucose Monitoring Suppl (ONE TOUCH ULTRA 2) w/Device KIT USETO CHECK BLOOD SUGAR EVERY MORNING 1 kit 0   budesonide-formoterol (SYMBICORT) 160-4.5 MCG/ACT inhaler Inhale 2 puffs into the lungs in the morning and at bedtime. (Patient taking differently: Inhale 2 puffs into the lungs daily.) 1 each 12   Continuous Blood Gluc Receiver (FREESTYLE LIBRE 2 READER) DEVI For continuous blood glucose monitoring 1 each 1   Continuous Glucose Sensor (FREESTYLE LIBRE 2 SENSOR) MISC APPLY 1 SENSOR ON THE SKIN EVERY 14 DAYS, USE AS DIRECTED TO CHECK BLOOD SUGAR 2 each 3   ELIQUIS 5 MG TABS tablet TAKE 1 TABLET BY MOUTH TWICE DAILY 60 tablet 3   empagliflozin (JARDIANCE) 25 MG TABS tablet TAKE 1 TABLET BY MOUTH DAILY BEFORE breakfast 90 tablet 0   flecainide (TAMBOCOR) 100 MG tablet Take 1 tablet (100 mg total) by mouth 2 (two) times daily. 180 tablet 3   furosemide (LASIX) 40 MG tablet Take 2 tablets (80 mg total) by mouth 2 (two) times daily. 180 tablet 1   insulin glargine (LANTUS) 100 UNIT/ML injection Inject 0.25 mLs (25 Units total) into the skin daily. PLEASE INCREASE INSULIN BY 1 UNIT DAILY UNTIL FBG IS <130, THEN HOLD AT THAT DOSE 30 mL 5   ipratropium-albuterol (DUONEB) 0.5-2.5 (3) MG/3ML SOLN Take 3 mLs by nebulization 3 (three) times daily. (Patient taking differently: Take 3 mLs by nebulization every 4 (four) hours as needed  (shortness of breath).) 360 mL 1   Lancets Misc. (ACCU-CHEK FASTCLIX LANCET) KIT Check BS QAM - DX: E11.9 1 kit 0   metFORMIN (GLUCOPHAGE) 1000 MG tablet TAKE 1 TABLET BY MOUTH TWICE DAILY WITH a meal 180 tablet 3   Multiple Vitamin (MULTIVITAMIN) capsule Take 1 capsule by mouth in the morning. Mens one a day     ONETOUCH ULTRA test strip USE TO CHECK BLOOD SUGAR EVERY MORNING 100 strip 12   Semaglutide, 2 MG/DOSE, 8 MG/3ML SOPN Inject 2 mg as directed once a week. 3 mL 3   spironolactone (ALDACTONE) 25 MG tablet TAKE 1 TABLET BY MOUTH DAILY 30 tablet 1   Zinc 30 MG CAPS Take 30 mg by mouth daily.     No current facility-administered medications for this encounter.    No Known Allergies  Social History   Socioeconomic History   Marital status: Divorced  Spouse name: Not on file   Number of children: 2   Years of education: Not on file   Highest education level: Not on file  Occupational History   Occupation: Self Employed  Tobacco Use   Smoking status: Some Days    Packs/day: 1.50    Years: 30.00    Additional pack years: 0.00    Total pack years: 45.00    Types: Cigarettes    Start date: 04/04/1958   Smokeless tobacco: Never   Tobacco comments:    Quit smoking day of DCCV 11/07/22    Started back due to stress - 1/2 pack per day - trying to cut back down. - 12/17/2022  Vaping Use   Vaping Use: Never used  Substance and Sexual Activity   Alcohol use: Yes    Alcohol/week: 1.0 standard drink of alcohol    Types: 1 Cans of beer per week    Comment: 1 beer once a month 10/12/22   Drug use: No   Sexual activity: Not on file    Comment: divorced, 2 daughters.  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  Other Topics Concern   Not on file  Social History Narrative   Pt states he usually smokes after eating or when stressed   Social Determinants of Health   Financial Resource Strain: Low Risk  (11/16/2022)   Overall Financial Resource Strain (CARDIA)    Difficulty of  Paying Living Expenses: Not very hard  Food Insecurity: No Food Insecurity (11/16/2022)   Hunger Vital Sign    Worried About Running Out of Food in the Last Year: Never true    Ran Out of Food in the Last Year: Never true  Transportation Needs: No Transportation Needs (10/26/2022)   PRAPARE - Administrator, Civil Service (Medical): No    Lack of Transportation (Non-Medical): No  Physical Activity: Sufficiently Active (09/06/2022)   Exercise Vital Sign    Days of Exercise per Week: 5 days    Minutes of Exercise per Session: 30 min  Stress: No Stress Concern Present (09/06/2022)   [employer] of Occupational Health - Occupational Stress Questionnaire    Feeling of Stress : Not at all  Social Connections: Socially Isolated (09/06/2022)   Social Connection and Isolation Panel [NHANES]    Frequency of Communication with Friends and Family: More than three times a week    Frequency of Social Gatherings with Friends and Family: Three times a week    Attends Religious Services: Never    Active Member of Clubs or Organizations: No    Attends Banker Meetings: Never    Marital Status: Divorced  Catering manager Violence: Not At Risk (10/26/2022)   Humiliation, Afraid, Rape, and Kick questionnaire    Fear of Current or Ex-Partner: No    Emotionally Abused: No    Physically Abused: No    Sexually Abused: No     ROS- All systems are reviewed and negative except as per the HPI above.  Physical Exam: Vitals:   02/06/23 1439  BP: 120/82  Pulse: 73  Weight: 128.3 kg    GEN- The patient is a well appearing male, alert and oriented x 3 today.   HEENT-head normocephalic, atraumatic, sclera clear, conjunctiva pink, hearing intact, trachea midline. Lungs- Clear to ausculation bilaterally, normal work of breathing Heart- Regular rate and rhythm, no rubs or gallops, 3/6 systolic murmur GI- soft, NT, ND, + BS Extremities- no clubbing, cyanosis, or edema MS- no  significant  deformity or atrophy Skin- no rash or lesion Psych- euthymic mood, full affect Neuro- strength and sensation are intact   Wt Readings from Last 3 Encounters:  02/06/23 128.3 kg  01/11/23 129.1 kg  01/09/23 126.6 kg    EKG today demonstrates  A sense V paced rhythm Vent. rate 73 BPM PR interval 234 ms QRS duration 118 ms QT/QTcB 384/423 ms  Echo 10/26/22 demonstrated   1. Left ventricular ejection fraction, by estimation, is 55 to 60%. The  left ventricle has normal function. The left ventricle has no regional  wall motion abnormalities. Left ventricular diastolic function could not  be evaluated.   2. Right ventricular systolic function is mildly reduced. The right  ventricular size is mildly enlarged. There is mildly elevated pulmonary  artery systolic pressure. The estimated right ventricular systolic  pressure is 43.5 mmHg.   3. Left atrial size was severely dilated.   4. Right atrial size was moderately dilated.   5. The mitral valve is grossly normal. No evidence of mitral valve  regurgitation. No evidence of mitral stenosis.   6. The aortic valve is calcified. There is moderate calcification of the  aortic valve. Aortic valve regurgitation is not visualized. Moderate to  severe aortic valve stenosis. Aortic valve area, by VTI measures 1.27 cm. Aortic valve mean gradient measures 34.5 mmHg. Aortic valve Vmax measures 4.15 m/s.   7. Aortic dilatation noted. There is borderline dilatation of the aortic  root, measuring 39 mm. There is mild dilatation of the ascending aorta,  measuring 40 mm.   8. The inferior vena cava is dilated in size with <50% respiratory  variability, suggesting right atrial pressure of 15 mmHg.   Comparison(s): No significant change from prior study.   Epic records are reviewed at length today  CHA2DS2-VASc Score = 5  The patient's score is based upon: CHF History: 1 HTN History: 1 Diabetes History: 1 Stroke History: 0 Vascular  Disease History: 1 Age Score: 1 Gender Score: 0       ASSESSMENT AND PLAN: 1. Persistent Atrial Fibrillation (ICD10:  I48.19) The patient's CHA2DS2-VASc score is 5, indicating a 7.2% annual risk of stroke.   S/p DCCV 01/09/23 Patient appears to be maintaining SR. Continue flecainide 100 mg BID for now, will review with EP given his coronary calcification on CT.  Continue Eliquis 5 mg BID Continue bisoprolol 5 mg daily  2. Secondary Hypercoagulable State (ICD10:  D68.69) The patient is at significant risk for stroke/thromboembolism based upon his CHA2DS2-VASc Score of 5.  Continue Apixaban (Eliquis).   3. Obesity Body mass index is 37.31 kg/m. Lifestyle modification was discussed and encouraged including regular physical activity and weight reduction. On semaglutide  4. Obstructive sleep apnea Encouraged compliance with CPAP therapy.  5. Sinus node dysfunction S/p PPM, followed by Dr Graciela Husbands and the device clinic.  6. HTN Stable, no changes today.   7. Chronic HFpEF Patient has been hospitalized several times with acute CHF.  GDMT includes bisoprolol, spironolactone, Jardiance Appears euvolemic today.  8. Aortic stenosis Severe AS Seen by Dr Excell Seltzer, undergoing workup for valve surgery.  9. CAD CAC score 447 on CT On statin Cardiac cath scheduled for 02/11/23. Check cbc/bmet today.  Cath instructions printed and given to patient again.    Follow up for cardiac catheterization as scheduled.    Jorja Loa PA-C [facility] [address] [place], [place] [address] [phone] 02/06/2023 3:26 PM

## 2023-02-06 NOTE — Addendum Note (Signed)
Addended by: Rob Bunting R on: 02/06/2023 03:01 PM   Modules accepted: Orders

## 2023-02-06 NOTE — Addendum Note (Signed)
Encounter addended by: Danice Goltz, PA on: 02/06/2023 3:35 PM  Actions taken: Clinical Note Signed

## 2023-02-07 ENCOUNTER — Other Ambulatory Visit: Payer: Self-pay | Admitting: Family Medicine

## 2023-02-07 ENCOUNTER — Telehealth: Payer: Self-pay | Admitting: *Deleted

## 2023-02-07 LAB — CBC
HCT: 47.7 % (ref 39.0–52.0)
Hemoglobin: 15.8 g/dL (ref 13.0–17.0)
MCH: 32.5 pg (ref 26.0–34.0)
MCHC: 33.1 g/dL (ref 30.0–36.0)
MCV: 98.1 fL (ref 80.0–100.0)
Platelets: 214 10*3/uL (ref 150–400)
RBC: 4.86 MIL/uL (ref 4.22–5.81)
RDW: 13.3 % (ref 11.5–15.5)
WBC: 10.7 10*3/uL — ABNORMAL HIGH (ref 4.0–10.5)
nRBC: 0 % (ref 0.0–0.2)

## 2023-02-07 LAB — BASIC METABOLIC PANEL
Anion gap: 17 — ABNORMAL HIGH (ref 5–15)
BUN: 14 mg/dL (ref 8–23)
CO2: 22 mmol/L (ref 22–32)
Calcium: 9.1 mg/dL (ref 8.9–10.3)
Chloride: 97 mmol/L — ABNORMAL LOW (ref 98–111)
Creatinine, Ser: 1.06 mg/dL (ref 0.61–1.24)
GFR, Estimated: >60 mL/min (ref >60)
Glucose, Bld: 159 mg/dL — ABNORMAL HIGH (ref 70–99)
Potassium: 3.2 mmol/L — ABNORMAL LOW (ref 3.5–5.1)
Sodium: 136 mmol/L (ref 135–145)

## 2023-02-07 NOTE — Telephone Encounter (Signed)
Cardiac Catheterization scheduled at [facility] for: Monday Feb 11, 2023 9 AM Arrival time [facility] Main Entrance A at: 7 AM  Nothing to eat after midnight prior to procedure, clear liquids until 5 AM day of procedure.  Medication instructions: -Hold:  Eliquis-none 02/09/23 until post procedure  Insulin-AM of procedure-pt reports he does not use Insulin HS  Jardiance-AM of procedure  Metformin-day of procedure and 48 hours post procedure  Semiglutide-pt reports weekly on Fridays-pt will hold until post procedure  Lasix/Spironolactone-AM of procedure -Other usual morning medications can be taken with sips of water including aspirin 81 mg.  Confirmed patient has responsible adult to drive home post procedure and be with patient first 24 hours after arriving home.  Plan to go home the same day, you will only stay overnight if medically necessary.  Reviewed procedure instructions with patient.

## 2023-02-07 NOTE — Addendum Note (Signed)
Encounter addended by: Shona Simpson, RN on: 02/07/2023 9:55 AM  Actions taken: Order list changed, Diagnosis association updated

## 2023-02-07 NOTE — Telephone Encounter (Signed)
Requested Prescriptions  Pending Prescriptions Disp Refills   losartan (COZAAR) 50 MG tablet [Pharmacy Med Name: losartan 50 mg tablet] 90 tablet 3    Sig: TAKE 1 TABLET BY MOUTH DAILY     Cardiovascular:  Angiotensin Receptor Blockers Failed - 02/07/2023 11:33 AM      Failed - Valid encounter within last 6 months    Recent Outpatient Visits           1 year ago Acute pain of left knee   [facility] Donita Brooks, MD   1 year ago Rib pain on left side   [facility] Pickard, Priscille Heidelberg, MD   1 year ago Rib pain on left side   [facility] Valentino Nose, NP   1 year ago Uncontrolled type 2 diabetes mellitus with hyperosmolarity without coma, without long-term current use of insulin (HCC)   [facility] Donita Brooks, MD   2 years ago Acute pulmonary edema (HCC)   [facility] Pickard, Priscille Heidelberg, MD       Future Appointments             In 3 weeks Sherene Sires, Charlaine Dalton, MD [facility]   In 4 months Sharlene Dory, NP [facility] at [place], [place]            Passed - Cr in normal range and within 180 days    Creat  Date Value Ref Range Status  12/07/2022 0.93 0.70 - 1.28 mg/dL Final   Creatinine, Ser  Date Value Ref Range Status  02/06/2023 1.06 0.61 - 1.24 mg/dL Final   Creatinine, Urine  Date Value Ref Range Status  08/22/2022 37 20 - 320 mg/dL Final         Passed - K in normal range and within 180 days    Potassium  Date Value Ref Range Status  02/06/2023 3.2 (L) 3.5 - 5.1 mmol/L Final         Passed - Patient is not pregnant      Passed - Last BP in normal range    BP Readings from Last 1 Encounters:  02/06/23 120/82

## 2023-02-08 ENCOUNTER — Other Ambulatory Visit (HOSPITAL_COMMUNITY): Payer: Medicare Other

## 2023-02-08 MED ORDER — POTASSIUM CHLORIDE CRYS ER 20 MEQ PO TBCR: EXTENDED_RELEASE_TABLET | ORAL | 1 refills | Status: DC

## 2023-02-08 NOTE — Addendum Note (Signed)
Addended by: Jacqlyn Krauss on: 02/08/2023 04:46 PM   Modules accepted: Orders

## 2023-02-08 NOTE — Telephone Encounter (Addendum)
02/06/23 K 3.2 Per Georgie Chard, NP-KCl 40 mEq for 3 days, then decrease to 20 mEq daily, recheck in 1 week.  Patient is aware to start KCl 20 mEq bid for 3 days, then KCl 20 mEq daily, will recheck BMP on arrival to Short Stay 02/11/23.

## 2023-02-11 ENCOUNTER — Other Ambulatory Visit: Payer: Self-pay

## 2023-02-11 ENCOUNTER — Encounter (HOSPITAL_COMMUNITY)
Admission: RE | Disposition: A | Discharge status: Home / Self Care | Payer: Self-pay | Source: Home / Self Care | Attending: Cardiovascular Disease

## 2023-02-11 ENCOUNTER — Ambulatory Visit (HOSPITAL_COMMUNITY)
Admission: RE | Admit: 2023-02-11 | Discharge: 2023-02-11 | Disposition: A | Discharge status: Home / Self Care | Payer: Medicare Other | Attending: Cardiovascular Disease | Admitting: Cardiovascular Disease

## 2023-02-11 DIAGNOSIS — I4819 Other persistent atrial fibrillation: Secondary | ICD-10-CM | POA: Insufficient documentation

## 2023-02-11 DIAGNOSIS — Z794 Long term (current) use of insulin: Secondary | ICD-10-CM | POA: Diagnosis not present

## 2023-02-11 DIAGNOSIS — Z87891 Personal history of nicotine dependence: Secondary | ICD-10-CM | POA: Diagnosis not present

## 2023-02-11 DIAGNOSIS — E1122 Type 2 diabetes mellitus with diabetic chronic kidney disease: Secondary | ICD-10-CM | POA: Diagnosis not present

## 2023-02-11 DIAGNOSIS — I130 Hypertensive heart and chronic kidney disease with heart failure and stage 1 through stage 4 chronic kidney disease, or unspecified chronic kidney disease: Secondary | ICD-10-CM | POA: Diagnosis not present

## 2023-02-11 DIAGNOSIS — N183 Chronic kidney disease, stage 3 unspecified: Secondary | ICD-10-CM | POA: Diagnosis not present

## 2023-02-11 DIAGNOSIS — E876 Hypokalemia: Secondary | ICD-10-CM

## 2023-02-11 DIAGNOSIS — G4733 Obstructive sleep apnea (adult) (pediatric): Secondary | ICD-10-CM | POA: Diagnosis not present

## 2023-02-11 DIAGNOSIS — D6869 Other thrombophilia: Secondary | ICD-10-CM | POA: Insufficient documentation

## 2023-02-11 DIAGNOSIS — J449 Chronic obstructive pulmonary disease, unspecified: Secondary | ICD-10-CM | POA: Diagnosis not present

## 2023-02-11 DIAGNOSIS — Z79899 Other long term (current) drug therapy: Secondary | ICD-10-CM | POA: Diagnosis not present

## 2023-02-11 DIAGNOSIS — Z6837 Body mass index (BMI) 37.0-37.9, adult: Secondary | ICD-10-CM | POA: Diagnosis not present

## 2023-02-11 DIAGNOSIS — Z950 Presence of cardiac pacemaker: Secondary | ICD-10-CM | POA: Diagnosis not present

## 2023-02-11 DIAGNOSIS — E669 Obesity, unspecified: Secondary | ICD-10-CM | POA: Diagnosis not present

## 2023-02-11 DIAGNOSIS — Z7901 Long term (current) use of anticoagulants: Secondary | ICD-10-CM | POA: Insufficient documentation

## 2023-02-11 DIAGNOSIS — Z7984 Long term (current) use of oral hypoglycemic drugs: Secondary | ICD-10-CM | POA: Diagnosis not present

## 2023-02-11 DIAGNOSIS — Z7985 Long-term (current) use of injectable non-insulin antidiabetic drugs: Secondary | ICD-10-CM | POA: Diagnosis not present

## 2023-02-11 DIAGNOSIS — I5032 Chronic diastolic (congestive) heart failure: Secondary | ICD-10-CM | POA: Insufficient documentation

## 2023-02-11 DIAGNOSIS — I2510 Atherosclerotic heart disease of native coronary artery without angina pectoris: Secondary | ICD-10-CM

## 2023-02-11 DIAGNOSIS — I5033 Acute on chronic diastolic (congestive) heart failure: Secondary | ICD-10-CM

## 2023-02-11 DIAGNOSIS — I495 Sick sinus syndrome: Secondary | ICD-10-CM | POA: Diagnosis not present

## 2023-02-11 DIAGNOSIS — Z01812 Encounter for preprocedural laboratory examination: Secondary | ICD-10-CM

## 2023-02-11 DIAGNOSIS — I350 Nonrheumatic aortic (valve) stenosis: Secondary | ICD-10-CM | POA: Insufficient documentation

## 2023-02-11 HISTORY — PX: RIGHT/LEFT HEART CATH AND CORONARY ANGIOGRAPHY: CATH118266

## 2023-02-11 LAB — POCT I-STAT 7, (LYTES, BLD GAS, ICA,H+H)
Acid-Base Excess: 2 mmol/L (ref 0.0–2.0)
Bicarbonate: 28.3 mmol/L — ABNORMAL HIGH (ref 20.0–28.0)
Calcium, Ion: 1.18 mmol/L (ref 1.15–1.40)
HCT: 47 % (ref 39.0–52.0)
Hemoglobin: 16 g/dL (ref 13.0–17.0)
O2 Saturation: 91 %
Potassium: 3.8 mmol/L (ref 3.5–5.1)
Sodium: 141 mmol/L (ref 135–145)
TCO2: 30 mmol/L (ref 22–32)
pCO2 arterial: 48.4 mmHg — ABNORMAL HIGH (ref 32–48)
pH, Arterial: 7.374 (ref 7.35–7.45)
pO2, Arterial: 63 mmHg — ABNORMAL LOW (ref 83–108)

## 2023-02-11 LAB — POCT I-STAT EG7
Acid-Base Excess: 3 mmol/L — ABNORMAL HIGH (ref 0.0–2.0)
Bicarbonate: 29.7 mmol/L — ABNORMAL HIGH (ref 20.0–28.0)
Calcium, Ion: 1.18 mmol/L (ref 1.15–1.40)
HCT: 47 % (ref 39.0–52.0)
Hemoglobin: 16 g/dL (ref 13.0–17.0)
O2 Saturation: 61 %
Potassium: 3.9 mmol/L (ref 3.5–5.1)
Sodium: 141 mmol/L (ref 135–145)
TCO2: 31 mmol/L (ref 22–32)
pCO2, Ven: 53.2 mmHg (ref 44–60)
pH, Ven: 7.354 (ref 7.25–7.43)
pO2, Ven: 34 mmHg (ref 32–45)

## 2023-02-11 LAB — BASIC METABOLIC PANEL
Anion gap: 12 (ref 5–15)
BUN: 16 mg/dL (ref 8–23)
CO2: 27 mmol/L (ref 22–32)
Calcium: 9.5 mg/dL (ref 8.9–10.3)
Chloride: 98 mmol/L (ref 98–111)
Creatinine, Ser: 1.06 mg/dL (ref 0.61–1.24)
GFR, Estimated: >60 mL/min (ref >60)
Glucose, Bld: 149 mg/dL — ABNORMAL HIGH (ref 70–99)
Potassium: 3.8 mmol/L (ref 3.5–5.1)
Sodium: 137 mmol/L (ref 135–145)

## 2023-02-11 LAB — GLUCOSE, CAPILLARY: Glucose-Capillary: 147 mg/dL — ABNORMAL HIGH (ref 70–99)

## 2023-02-11 SURGERY — RIGHT/LEFT HEART CATH AND CORONARY ANGIOGRAPHY
Anesthesia: LOCAL

## 2023-02-11 MED ORDER — SODIUM CHLORIDE 0.9 % IV SOLN: INTRAVENOUS | Status: DC

## 2023-02-11 MED ORDER — FENTANYL CITRATE (PF) 100 MCG/2ML IJ SOLN
INTRAMUSCULAR | Status: AC
  Filled 2023-02-11: qty 2

## 2023-02-11 MED ORDER — SODIUM CHLORIDE 0.9% FLUSH: 3.0000 mL | Freq: Two times a day (BID) | INTRAVENOUS | Status: DC

## 2023-02-11 MED ORDER — SODIUM CHLORIDE 0.9 % WEIGHT BASED INFUSION
3.0000 mL/kg/h | INTRAVENOUS | Status: AC
  Administered 2023-02-11: 3 mL/kg/h via INTRAVENOUS

## 2023-02-11 MED ORDER — HEPARIN SODIUM (PORCINE) 1000 UNIT/ML IJ SOLN
INTRAMUSCULAR | Status: DC | PRN
  Administered 2023-02-11: 5000 [IU] via INTRAVENOUS

## 2023-02-11 MED ORDER — IOHEXOL 350 MG/ML SOLN
INTRAVENOUS | Status: DC | PRN
  Administered 2023-02-11: 45 mL

## 2023-02-11 MED ORDER — SODIUM CHLORIDE 0.9 % WEIGHT BASED INFUSION: 1.0000 mL/kg/h | INTRAVENOUS | Status: DC

## 2023-02-11 MED ORDER — SODIUM CHLORIDE 0.9% FLUSH: 3.0000 mL | INTRAVENOUS | Status: DC | PRN

## 2023-02-11 MED ORDER — HYDRALAZINE HCL 20 MG/ML IJ SOLN: 10.0000 mg | INTRAMUSCULAR | Status: DC | PRN

## 2023-02-11 MED ORDER — ONDANSETRON HCL 4 MG/2ML IJ SOLN: 4.0000 mg | Freq: Four times a day (QID) | INTRAMUSCULAR | Status: DC | PRN

## 2023-02-11 MED ORDER — LABETALOL HCL 5 MG/ML IV SOLN: 10.0000 mg | INTRAVENOUS | Status: DC | PRN

## 2023-02-11 MED ORDER — HEPARIN (PORCINE) IN NACL 1000-0.9 UT/500ML-% IV SOLN
INTRAVENOUS | Status: DC | PRN
  Administered 2023-02-11 (×2): 500 mL

## 2023-02-11 MED ORDER — VERAPAMIL HCL 2.5 MG/ML IV SOLN
INTRAVENOUS | Status: DC | PRN
  Administered 2023-02-11: 10 mL via INTRA_ARTERIAL

## 2023-02-11 MED ORDER — VERAPAMIL HCL 2.5 MG/ML IV SOLN
INTRAVENOUS | Status: AC
  Filled 2023-02-11: qty 2

## 2023-02-11 MED ORDER — MIDAZOLAM HCL 2 MG/2ML IJ SOLN
INTRAMUSCULAR | Status: DC | PRN
  Administered 2023-02-11: 2 mg via INTRAVENOUS

## 2023-02-11 MED ORDER — LIDOCAINE HCL (PF) 1 % IJ SOLN
INTRAMUSCULAR | Status: DC | PRN
  Administered 2023-02-11: 10 mL via INTRADERMAL

## 2023-02-11 MED ORDER — ACETAMINOPHEN 325 MG PO TABS: 650.0000 mg | ORAL_TABLET | ORAL | Status: DC | PRN

## 2023-02-11 MED ORDER — HEPARIN SODIUM (PORCINE) 1000 UNIT/ML IJ SOLN
INTRAMUSCULAR | Status: AC
  Filled 2023-02-11: qty 10

## 2023-02-11 MED ORDER — FENTANYL CITRATE (PF) 100 MCG/2ML IJ SOLN
INTRAMUSCULAR | Status: DC | PRN
  Administered 2023-02-11: 25 ug via INTRAVENOUS

## 2023-02-11 MED ORDER — SODIUM CHLORIDE 0.9 % IV SOLN: 250.0000 mL | INTRAVENOUS | Status: DC | PRN

## 2023-02-11 MED ORDER — MIDAZOLAM HCL 2 MG/2ML IJ SOLN
INTRAMUSCULAR | Status: AC
  Filled 2023-02-11: qty 2

## 2023-02-11 MED ORDER — LIDOCAINE HCL (PF) 1 % IJ SOLN
INTRAMUSCULAR | Status: AC
  Filled 2023-02-11: qty 30

## 2023-02-11 MED ORDER — ASPIRIN 81 MG PO CHEW: 81.0000 mg | CHEWABLE_TABLET | ORAL | Status: DC

## 2023-02-11 SURGICAL SUPPLY — 14 items
BAND CMPR LRG ZPHR (HEMOSTASIS) ×1
BAND ZEPHYR COMPRESS 30 LONG (HEMOSTASIS) IMPLANT
CATH 5FR JL3.5 JR4 ANG PIG MP (CATHETERS) IMPLANT
CATH BALLN WEDGE 5F 110CM (CATHETERS) IMPLANT
GLIDESHEATH SLEND SS 6F .021 (SHEATH) IMPLANT
GUIDEWIRE INQWIRE 1.5J.035X260 (WIRE) IMPLANT
INQWIRE 1.5J .035X260CM (WIRE) ×1
KIT HEART LEFT (KITS) ×1 IMPLANT
PACK CARDIAC CATHETERIZATION (CUSTOM PROCEDURE TRAY) ×1 IMPLANT
SHEATH GLIDE SLENDER 4/5FR (SHEATH) IMPLANT
SHEATH PROBE COVER 6X72 (BAG) IMPLANT
TRANSDUCER W/STOPCOCK (MISCELLANEOUS) ×1 IMPLANT
TUBING CIL FLEX 10 FLL-RA (TUBING) ×1 IMPLANT
WIRE EMERALD 3MM-J .025X260CM (WIRE) IMPLANT

## 2023-02-11 NOTE — Interval H&P Note (Signed)
History and Physical Interval Note:  02/11/2023 8:56 AM  Steven Henry  has presented today for surgery, with the diagnosis of aorti stenosis.  The various methods of treatment have been discussed with the patient and family. After consideration of risks, benefits and other options for treatment, the patient has consented to  Procedure(s): RIGHT/LEFT HEART CATH AND CORONARY ANGIOGRAPHY (N/A) as a surgical intervention.  The patient's history has been reviewed, patient examined, no change in status, stable for surgery.  I have reviewed the patient's chart and labs.  Questions were answered to the patient's satisfaction.    CTA studies have been reviewed. Pt presents for R/L cath to evaluate for aortic valve replacement (TAVR versus surgical).   [name]

## 2023-02-11 NOTE — Discharge Instructions (Signed)

## 2023-02-12 ENCOUNTER — Encounter (HOSPITAL_COMMUNITY): Payer: Self-pay | Admitting: Cardiovascular Disease

## 2023-02-20 ENCOUNTER — Other Ambulatory Visit: Payer: Self-pay | Admitting: Family Medicine

## 2023-02-20 NOTE — Telephone Encounter (Signed)
Requested Prescriptions  Pending Prescriptions Disp Refills   spironolactone (ALDACTONE) 25 MG tablet [Pharmacy Med Name: spironolactone 25 mg tablet] 90 tablet 0    Sig: TAKE 1 TABLET BY MOUTH DAILY     Cardiovascular: Diuretics - Aldosterone Antagonist Failed - 02/20/2023  8:03 AM      Failed - Valid encounter within last 6 months    Recent Outpatient Visits           1 year ago Acute pain of left knee   [facility] Donita Brooks, MD   1 year ago Rib pain on left side   [facility] Pickard, Priscille Heidelberg, MD   1 year ago Rib pain on left side   [facility] Valentino Nose, NP   1 year ago Uncontrolled type 2 diabetes mellitus with hyperosmolarity without coma, without long-term current use of insulin (HCC)   [facility] Pickard, Priscille Heidelberg, MD   2 years ago Acute pulmonary edema (HCC)   [facility] Pickard, Priscille Heidelberg, MD       Future Appointments             In 1 week Sherene Sires Charlaine Dalton, MD  Pulmonary Care   In 4 months Sharlene Dory, NP [facility] at [place], [place]            Passed - Cr in normal range and within 180 days    Creat  Date Value Ref Range Status  12/07/2022 0.93 0.70 - 1.28 mg/dL Final   Creatinine, Ser  Date Value Ref Range Status  02/11/2023 1.06 0.61 - 1.24 mg/dL Final   Creatinine, Urine  Date Value Ref Range Status  08/22/2022 37 20 - 320 mg/dL Final         Passed - K in normal range and within 180 days    Potassium  Date Value Ref Range Status  02/11/2023 3.8 3.5 - 5.1 mmol/L Final         Passed - Na in normal range and within 180 days    Sodium  Date Value Ref Range Status  02/11/2023 141 135 - 145 mmol/L Final         Passed - eGFR is 30 or above and within 180 days    GFR, Est African American  Date Value Ref Range Status  08/01/2020 101 > OR = 60 mL/min/1.63m2 Final   GFR, Est Non African American  Date Value Ref Range  Status  08/01/2020 87 > OR = 60 mL/min/1.73m2 Final   GFR, Estimated  Date Value Ref Range Status  02/11/2023 >60 >60 mL/min Final    Comment:    (NOTE) Calculated using the CKD-EPI Creatinine Equation (2021)    eGFR  Date Value Ref Range Status  12/07/2022 88 > OR = 60 mL/min/1.49m2 Final         Passed - Last BP in normal range    BP Readings from Last 1 Encounters:  02/11/23 127/76

## 2023-02-27 NOTE — Progress Notes (Unsigned)
Subjective:    Patient ID: Steven Henry, male    DOB: Nov 27, 1950, MRN: [id number]  HPI   89 yowm active smoker   referred by Dr Nadara Eaton for eval of sob and cough  03/03/2013 1st pulmonary eval cc doe x several years x indolent onset to point where problem walking across a parking lot better p rx with "a pill" by Dr Charleston Poot and now more limited by calf pain than sob but now bothered by cough on ACEi since 12/2012 indolent x 2 weeks, dry, day > night rec Stop lisinopril (zestril, prinivil) due to cough  Start micardis 80/12.5  One half daily and let your doctor know this when you get your blood work called in The key is to stop smoking completely before smoking completely stops you - it's not too late!   04/20/2013 f/u ov/Steven Henry re GOLD 0 COPD Chief Complaint  Patient presents with   Follow-up with PFT    Pt states that he feels breathing has improved some since his last visit. Cough has resolved.    doe much better,  walking mall is good, hills are a problem or if he gets in a real hurry. Rec Continue micardis 80 /12.5 one half daily but call if your insurance prefers a different medication in the same category (ARB)    11/29/2022  re establish  ov/[place] office/Steven Henry re: GOLD 0 copd/ ? Cardiac asthma maint on sybmicot 160 2 bid  s/p cardioversion 3 weeks prior to OV  seemed to really help doe  Chief Complaint  Patient presents with   Consult    Consult COPD from cardiology  Dyspnea:  Not limited by breathing from desired activities   Cough: minimal white  Sleeping: flat bed 2 pillow  SABA use: ? On symbicort  02: 4lpm hs with cpap  Covid status: never vax / once infected  Lung cancer screening: declined for now  Rec Continue symbicort but drop the dose to 2 puff each am  - if doing worse resume 2 twice daily  Please schedule a follow up visit in 3 months but call sooner if needed - bring inhalers  PFTs prior to return   02/28/2023  f/u ov/[place] office/Steven Henry re: *** maint on  ***  No chief complaint on file.   Dyspnea:  *** Cough: *** Sleeping: *** SABA use: *** 02: *** Covid status: *** Lung cancer screening: ***   No obvious day to day or daytime variability or assoc excess/ purulent sputum or mucus plugs or hemoptysis or cp or chest tightness, subjective wheeze or overt sinus or hb symptoms.   *** without nocturnal  or early am exacerbation  of respiratory  c/o's or need for noct saba. Also denies any obvious fluctuation of symptoms with weather or environmental changes or other aggravating or alleviating factors except as outlined above   No unusual exposure hx or h/o childhood pna/ asthma or knowledge of premature birth.  Current Allergies, Complete Past Medical History, Past Surgical History, Family History, and Social History were reviewed in [employer] record.  ROS  The following are not active complaints unless bolded Hoarseness, sore throat, dysphagia, dental problems, itching, sneezing,  nasal congestion or discharge of excess mucus or purulent secretions, ear ache,   fever, chills, sweats, unintended wt loss or wt gain, classically pleuritic or exertional cp,  orthopnea pnd or arm/hand swelling  or leg swelling, presyncope, palpitations, abdominal pain, anorexia, nausea, vomiting, diarrhea  or change in bowel habits or change in  bladder habits, change in stools or change in urine, dysuria, hematuria,  rash, arthralgias, visual complaints, headache, numbness, weakness or ataxia or problems with walking or coordination,  change in mood or  memory.        No outpatient medications have been marked as taking for the 02/28/23 encounter (Appointment) with Steven Cowden, MD.                Objective:   Physical Exam  02/28/2023          *** 11/29/2022          274  04/20/2013        342     03/03/13 344 lb (156.037 kg)  02/18/13 327 lb (148.326 kg)  01/08/13 352 lb (159.666 kg)     Vital signs reviewed  02/28/2023  - Note at  rest 02 sats  ***% on ***   General appearance:    ***   2/6 SEM ***    I personally reviewed images and agree with radiology impression as follows:  CXR:   portable 10/26/22 Cardiomegaly and interstitial edema similar to prior.     Assessment & Plan:

## 2023-02-28 ENCOUNTER — Ambulatory Visit: Payer: Medicare Other | Admitting: Internal Medicine

## 2023-02-28 ENCOUNTER — Encounter: Payer: Self-pay | Admitting: Internal Medicine

## 2023-02-28 VITALS — BP 108/62 | HR 76 | Ht 73.0 in | Wt 274.0 lb

## 2023-02-28 DIAGNOSIS — R9389 Abnormal findings on diagnostic imaging of other specified body structures: Secondary | ICD-10-CM | POA: Insufficient documentation

## 2023-02-28 DIAGNOSIS — J441 Chronic obstructive pulmonary disease with (acute) exacerbation: Secondary | ICD-10-CM | POA: Diagnosis not present

## 2023-02-28 DIAGNOSIS — F17210 Nicotine dependence, cigarettes, uncomplicated: Secondary | ICD-10-CM | POA: Diagnosis not present

## 2023-02-28 NOTE — Assessment & Plan Note (Signed)
Active smoker - PFTs 04/20/2013  FEV1 2.45 (63%) ratio 73 with no better p B2, DLCO 75% corrects to 95% and minimal concavity  - PFT's  02/05/23  FEV1 2.76 (76 % ) ratio 0.72  p 0 % improvement from saba p 0 prior to study with DLCO  21 (75%)   and FV curve min concave    - 02/28/2023 change symbicort to 160 2bid prn   Really has more AB than copd based on above findings and can probably use symb 160 on a prn basis as long as no worse doe or need for saba/ reviewed

## 2023-02-28 NOTE — Patient Instructions (Addendum)
Symbicort 160 can be used up to 2 pffs every 12 hours if needed   If start needing albuterol, you need to be back on the  symbicort 160  Only use the nebulizer if albuterol puffer 1st and it doesn't work   The key is to stop smoking completely before smoking completely stops you!  Make sure you check your oxygen saturation at your highest level of activity(NOT after you stop)  to be sure it stays over 90% and keep track of it at least once a week, more often if breathing getting worse, and let me know if losing ground. (Collect the dots to connect the dots approach)        Please schedule a follow up office visit in 6 months , call sooner if needed with all  respiratory medications /inhalers solutions in hand so we can verify exactly what you are taking. This includes all medications from all doctors and over the counters

## 2023-02-28 NOTE — Assessment & Plan Note (Signed)
4-5 min discussion re active cigarette smoking in addition to office E&M ? ?Ask about tobacco use:   ongoing ?Advise quitting  I took an extended  opportunity with this patient to outline the consequences of continued cigarette use  in airway disorders based on all the data we have from the multiple national lung health studies (perfomed over decades at millions of dollars in cost)  indicating that smoking cessation, not choice of inhalers or pulmonaryphysicians, is the most important aspect of his care.   ?Assess willingness:  Not committed at this point ?Assist in quit attempt:  Per PCP when ready ?Arrange follow up:   Follow up per Primary Care planned  ?  ?  ?  ?

## 2023-02-28 NOTE — Assessment & Plan Note (Addendum)
See study  01/21/23  - 02/28/2023   Walked on RA  x  3   lap(s) =  approx 450  ft  @ mod pace, stopped due to end of study  with lowest 02 sats 90% and no sob   PFTs do not suggest significant ILD and his activity tolerance is pretty good given his wt and mod AS so rec just follow for now with serial walking sats and work on wt loss/ stopping smoking in the meantime  F/u in 6 m with all meds in hand using a trust but verify approach to confirm accurate Medication  Reconciliation The principal here is that until we are certain that the  patients are doing what we've asked, it makes no sense to ask them to do more.    Each maintenance medication was reviewed in detail including emphasizing most importantly the difference between maintenance and prns and under what circumstances the prns are to be triggered using an action plan format where appropriate.  Total time for H and P, chart review, counseling, reviewing hfa/neb device(s) , directly observing portions of ambulatory 02 saturation study/ and generating customized AVS unique to this office visit / same day charting  > 30 min for multiple  refractory respiratory  issues

## 2023-03-01 ENCOUNTER — Other Ambulatory Visit: Payer: Self-pay

## 2023-03-03 DIAGNOSIS — J9601 Acute respiratory failure with hypoxia: Secondary | ICD-10-CM | POA: Diagnosis not present

## 2023-03-05 ENCOUNTER — Other Ambulatory Visit: Payer: Self-pay | Admitting: Family Medicine

## 2023-03-05 MED ORDER — LOSARTAN POTASSIUM 50 MG PO TABS: 50.0000 mg | ORAL_TABLET | Freq: Every day | ORAL | 3 refills | Status: DC

## 2023-03-06 ENCOUNTER — Encounter: Payer: Self-pay | Admitting: Surgery

## 2023-03-06 ENCOUNTER — Institutional Professional Consult (permissible substitution): Payer: Medicare Other | Admitting: Surgery

## 2023-03-06 VITALS — BP 108/69 | HR 81 | Resp 18 | Ht 73.0 in | Wt 247.0 lb

## 2023-03-06 DIAGNOSIS — I350 Nonrheumatic aortic (valve) stenosis: Secondary | ICD-10-CM

## 2023-03-07 NOTE — Progress Notes (Signed)
Patient ID: Steven Henry, male   DOB: 1951-04-16, 72 y.o.   MRN: [id number]  [facility]       [address]       [name] [address]             [phone]          CARDIOTHORACIC SURGERY CONSULTATION REPORT  PCP is Donita Brooks, MD Referring Provider is [name], MD Primary Cardiologist is Marjo Bicker, MD  Reason for consultation: Severe aortic stenosis  HPI:  The patient is a 72 year old gentleman with a history of type 2 diabetes, hypertension, active smoking and COPD on home oxygen, OSA not on CPAP, stage III chronic kidney disease, atrial fibrillation on Eliquis with DCCV in February and April 2024, status post permanent pacemaker placement in 08/2022, HFpEF and aortic stenosis who was referred for consideration of TAVR.  He has had 3 admissions over the past year for congestive heart failure and shortness of breath that resolved with diuresis.  Since his cardioversion in April 2024 he said that he is felt fairly well.  His most recent echocardiogram on 10/26/2022 showed a moderately calcified and thickened aortic valve with restricted leaflet mobility.  The mean gradient was 34.5 mmHg with a peak gradient of 68.9 mmHg.  Valve area by VTI was measured at 1.27 cm.  Left ventricular ejection fraction was 55 to 60% with mild right ventricular enlargement and mild decreased function.  He is here today by himself.  He is divorced and lives in [place].  He said that he continues to smoke but has cut it down to about 6 cigarettes/day.  He used to smoke 3 packs/day but started cutting it down a few years ago.  He denies any chest pain or pressure.  He continues to have some exertional fatigue but denies any significant shortness of breath.  He has had some lower extremity edema.  He denies any dizziness or syncope.  Past Medical History:  Diagnosis Date   A-fib (HCC)    CHF (congestive  heart failure) (HCC)    CKD (chronic kidney disease), stage III (HCC)    Colon polyps    Diabetes mellitus type II, uncontrolled    Diabetes mellitus without complication (HCC)    Hypertension    Morbid obesity (HCC)    PVD (peripheral vascular disease) (HCC)    right femoral artery   Sleep apnea    not on CPAP    Sleep apnea     Past Surgical History:  Procedure Laterality Date   CARDIOVERSION N/A 11/05/2022   Procedure: CARDIOVERSION;  Surgeon: Maisie Fus, MD;  Location: [facility] ENDOSCOPY;  Service: Cardiovascular;  Laterality: N/A;   CARDIOVERSION N/A 01/09/2023   Procedure: CARDIOVERSION;  Surgeon: Jake Bathe, MD;  Location: [facility] INVASIVE CV LAB;  Service: Cardiovascular;  Laterality: N/A;   COLONOSCOPY N/A 09/16/2015   Procedure: COLONOSCOPY;  Surgeon: Corbin Ade, MD;  Location: [facility] ENDO SUITE;  Service: Endoscopy;  Laterality: N/A;  8:15 AM - pt has c pap   ENDOVENOUS ABLATION SAPHENOUS VEIN W/ LASER Right 08/26/2017   endovenous laser ablation right greater saphenous vein by Josephina Gip MD    PACEMAKER IMPLANT N/A 09/14/2022   Procedure: PACEMAKER IMPLANT;  Surgeon: Duke Salvia, MD;  Location: [facility] INVASIVE CV LAB;  Service: Cardiovascular;  Laterality: N/A;   RIGHT/LEFT HEART CATH AND CORONARY ANGIOGRAPHY N/A 02/11/2023   Procedure:  RIGHT/LEFT HEART CATH AND CORONARY ANGIOGRAPHY;  Surgeon: [name], MD;  Location: [facility] INVASIVE CV LAB;  Service: Cardiovascular;  Laterality: N/A;   stab phlebectomy  Right 01/07/2018   stab phlebectomy 10-20 incisions right leg by Josephina Gip MD     Family History  Problem Relation Age of Onset   Colon cancer Mother    Diabetes Mother    Heart disease Mother    Diabetes Sister    Diabetes Sister    Diabetes Sister     Social History   Socioeconomic History   Marital status: Divorced    Spouse name: Not on file   Number of children: 2   Years of education: Not on file   Highest education level: Not on file   Occupational History   Occupation: Self Employed  Tobacco Use   Smoking status: Some Days    Packs/day: 1.50    Years: 30.00    Additional pack years: 0.00    Total pack years: 45.00    Types: Cigarettes    Start date: 04/04/1958   Smokeless tobacco: Never   Tobacco comments:    Quit smoking day of DCCV 11/07/22    Started back due to stress - 1/2 pack per day - trying to cut back down. - 12/17/2022  Vaping Use   Vaping Use: Never used  Substance and Sexual Activity   Alcohol use: Yes    Alcohol/week: 1.0 standard drink of alcohol    Types: 1 Cans of beer per week    Comment: 1 beer once a month 10/12/22   Drug use: No   Sexual activity: Not on file    Comment: divorced, 2 daughters.  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  Other Topics Concern   Not on file  Social History Narrative   Pt states he usually smokes after eating or when stressed   Social Determinants of Health   Financial Resource Strain: Low Risk  (11/16/2022)   Overall Financial Resource Strain (CARDIA)    Difficulty of Paying Living Expenses: Not very hard  Food Insecurity: No Food Insecurity (11/16/2022)   Hunger Vital Sign    Worried About Running Out of Food in the Last Year: Never true    Ran Out of Food in the Last Year: Never true  Transportation Needs: No Transportation Needs (10/26/2022)   PRAPARE - Administrator, Civil Service (Medical): No    Lack of Transportation (Non-Medical): No  Physical Activity: Sufficiently Active (09/06/2022)   Exercise Vital Sign    Days of Exercise per Week: 5 days    Minutes of Exercise per Session: 30 min  Stress: No Stress Concern Present (09/06/2022)   [employer] of Occupational Health - Occupational Stress Questionnaire    Feeling of Stress : Not at all  Social Connections: Socially Isolated (09/06/2022)   Social Connection and Isolation Panel [NHANES]    Frequency of Communication with Friends and Family: More than three times a week     Frequency of Social Gatherings with Friends and Family: Three times a week    Attends Religious Services: Never    Active Member of Clubs or Organizations: No    Attends Banker Meetings: Never    Marital Status: Divorced  Catering manager Violence: Not At Risk (10/26/2022)   Humiliation, Afraid, Rape, and Kick questionnaire    Fear of Current or Ex-Partner: No    Emotionally Abused: No    Physically Abused: No    Sexually  Abused: No    Prior to Admission medications   Medication Sig Start Date End Date Taking? Authorizing Provider  Accu-Chek FastClix Lancets MISC Check BS QAM - DX: E11.9 02/12/19  Yes Donita Brooks, MD  albuterol (VENTOLIN HFA) 108 (90 Base) MCG/ACT inhaler Inhale 2 puffs into the lungs every 6 (six) hours as needed for wheezing or shortness of breath. 11/19/22  Yes Pickard, Priscille Heidelberg, MD  atorvastatin (LIPITOR) 40 MG tablet TAKE 1 TABLET BY MOUTH DAILY Patient taking differently: Take 40 mg by mouth at bedtime. 08/13/22  Yes Donita Brooks, MD  bisoprolol (ZEBETA) 5 MG tablet Take 1 tablet (5 mg total) by mouth daily. Patient taking differently: Take 5 mg by mouth at bedtime. 09/14/22  Yes Duke, Roe Rutherford, PA  Blood Glucose Monitoring Suppl (ONE TOUCH ULTRA 2) w/Device KIT USETO CHECK BLOOD SUGAR EVERY MORNING 05/26/21  Yes Donita Brooks, MD  budesonide-formoterol [facility]) 160-4.5 MCG/ACT inhaler Inhale 2 puffs into the lungs in the morning and at bedtime. Patient taking differently: Inhale 2 puffs into the lungs daily. 10/29/22  Yes Vassie Loll, MD  Continuous Blood Gluc Receiver (FREESTYLE LIBRE 2 READER) DEVI For continuous blood glucose monitoring 06/14/22  Yes Donita Brooks, MD  Continuous Glucose Sensor (FREESTYLE LIBRE 2 SENSOR) MISC APPLY 1 SENSOR ON THE SKIN EVERY 14 DAYS, USE AS DIRECTED TO CHECK BLOOD SUGAR 01/28/23  Yes Donita Brooks, MD  ELIQUIS 5 MG TABS tablet TAKE 1 TABLET BY MOUTH TWICE DAILY 01/29/23  Yes Donita Brooks, MD   empagliflozin (JARDIANCE) 25 MG TABS tablet TAKE 1 TABLET BY MOUTH DAILY BEFORE breakfast 12/24/22  Yes Donita Brooks, MD  flecainide (TAMBOCOR) 100 MG tablet Take 1 tablet (100 mg total) by mouth 2 (two) times daily. 12/24/22  Yes Duke Salvia, MD  furosemide (LASIX) 40 MG tablet Take 2 tablets (80 mg total) by mouth 2 (two) times daily. 10/29/22  Yes Vassie Loll, MD  glipiZIDE (GLUCOTROL) 10 MG tablet Take 10 mg by mouth every morning. 02/07/23  Yes [provider]  insulin glargine (LANTUS) 100 UNIT/ML injection Inject 0.25 mLs (25 Units total) into the skin daily. PLEASE INCREASE INSULIN BY 1 UNIT DAILY UNTIL FBG IS <130, THEN HOLD AT THAT DOSE 08/23/22  Yes Donita Brooks, MD  ipratropium-albuterol (DUONEB) 0.5-2.5 (3) MG/3ML SOLN Take 3 mLs by nebulization 3 (three) times daily. Patient taking differently: Take 3 mLs by nebulization every 4 (four) hours as needed (shortness of breath). 03/01/22  Yes Johnson, Clanford L, MD  Lancets Misc. (ACCU-CHEK FASTCLIX LANCET) KIT Check BS QAM - DX: E11.9 02/12/19  Yes Donita Brooks, MD  losartan (COZAAR) 50 MG tablet Take 1 tablet (50 mg total) by mouth daily. 03/05/23  Yes Donita Brooks, MD  metFORMIN (GLUCOPHAGE) 1000 MG tablet TAKE 1 TABLET BY MOUTH TWICE DAILY WITH a meal 08/13/22  Yes Donita Brooks, MD  Multiple Vitamin (MULTIVITAMIN) capsule Take 1 capsule by mouth in the morning. Mens one a day   Yes [provider]  ONETOUCH ULTRA test strip USE TO CHECK BLOOD SUGAR EVERY MORNING 07/27/22  Yes Donita Brooks, MD  potassium chloride SA (KLOR-CON M20) 20 MEQ tablet Take 1 tablet by mouth twice a day for 3 days, then take 1 tablet by mouth daily. 02/08/23  Yes Georgie Chard D, NP  Semaglutide, 2 MG/DOSE, 8 MG/3ML SOPN Inject 2 mg as directed once a week. 12/20/22  Yes Donita Brooks, MD  spironolactone (ALDACTONE) 25 MG tablet TAKE 1 TABLET BY MOUTH DAILY 02/20/23  Yes Donita Brooks, MD  Zinc 30 MG CAPS Take 30  mg by mouth daily.   Yes [provider]    Current Outpatient Medications  Medication Sig Dispense Refill   Accu-Chek FastClix Lancets MISC Check BS QAM - DX: E11.9 100 each 3   albuterol (VENTOLIN HFA) 108 (90 Base) MCG/ACT inhaler Inhale 2 puffs into the lungs every 6 (six) hours as needed for wheezing or shortness of breath. 8 g 0   atorvastatin (LIPITOR) 40 MG tablet TAKE 1 TABLET BY MOUTH DAILY (Patient taking differently: Take 40 mg by mouth at bedtime.) 90 tablet 3   bisoprolol (ZEBETA) 5 MG tablet Take 1 tablet (5 mg total) by mouth daily. (Patient taking differently: Take 5 mg by mouth at bedtime.) 90 tablet 3   Blood Glucose Monitoring Suppl (ONE TOUCH ULTRA 2) w/Device KIT USETO CHECK BLOOD SUGAR EVERY MORNING 1 kit 0   budesonide-formoterol (SYMBICORT) 160-4.5 MCG/ACT inhaler Inhale 2 puffs into the lungs in the morning and at bedtime. (Patient taking differently: Inhale 2 puffs into the lungs daily.) 1 each 12   Continuous Blood Gluc Receiver (FREESTYLE LIBRE 2 READER) DEVI For continuous blood glucose monitoring 1 each 1   Continuous Glucose Sensor (FREESTYLE LIBRE 2 SENSOR) MISC APPLY 1 SENSOR ON THE SKIN EVERY 14 DAYS, USE AS DIRECTED TO CHECK BLOOD SUGAR 2 each 3   ELIQUIS 5 MG TABS tablet TAKE 1 TABLET BY MOUTH TWICE DAILY 60 tablet 3   empagliflozin (JARDIANCE) 25 MG TABS tablet TAKE 1 TABLET BY MOUTH DAILY BEFORE breakfast 90 tablet 0   flecainide (TAMBOCOR) 100 MG tablet Take 1 tablet (100 mg total) by mouth 2 (two) times daily. 180 tablet 3   furosemide (LASIX) 40 MG tablet Take 2 tablets (80 mg total) by mouth 2 (two) times daily. 180 tablet 1   glipiZIDE (GLUCOTROL) 10 MG tablet Take 10 mg by mouth every morning.     insulin glargine (LANTUS) 100 UNIT/ML injection Inject 0.25 mLs (25 Units total) into the skin daily. PLEASE INCREASE INSULIN BY 1 UNIT DAILY UNTIL FBG IS <130, THEN HOLD AT THAT DOSE 30 mL 5   ipratropium-albuterol (DUONEB) 0.5-2.5 (3) MG/3ML SOLN  Take 3 mLs by nebulization 3 (three) times daily. (Patient taking differently: Take 3 mLs by nebulization every 4 (four) hours as needed (shortness of breath).) 360 mL 1   Lancets Misc. (ACCU-CHEK FASTCLIX LANCET) KIT Check BS QAM - DX: E11.9 1 kit 0   losartan (COZAAR) 50 MG tablet Take 1 tablet (50 mg total) by mouth daily. 90 tablet 3   metFORMIN (GLUCOPHAGE) 1000 MG tablet TAKE 1 TABLET BY MOUTH TWICE DAILY WITH a meal 180 tablet 3   Multiple Vitamin (MULTIVITAMIN) capsule Take 1 capsule by mouth in the morning. Mens one a day     ONETOUCH ULTRA test strip USE TO CHECK BLOOD SUGAR EVERY MORNING 100 strip 12   potassium chloride SA (KLOR-CON M20) 20 MEQ tablet Take 1 tablet by mouth twice a day for 3 days, then take 1 tablet by mouth daily. 30 tablet 1   Semaglutide, 2 MG/DOSE, 8 MG/3ML SOPN Inject 2 mg as directed once a week. 3 mL 3   spironolactone (ALDACTONE) 25 MG tablet TAKE 1 TABLET BY MOUTH DAILY 90 tablet 0   Zinc 30 MG CAPS Take 30 mg by mouth daily.     No current facility-administered medications for this  visit.    No Known Allergies    Review of Systems:   General:  normal appetite, + decreased energy, no weight gain, no weight loss, no fever  Cardiac:  no chest pain with exertion, no chest pain at rest, no SOB with  exertion, no resting SOB, no PND, no orthopnea, no palpitations, + arrhythmia, + atrial fibrillation, + LE edema, no dizzy spells, no syncope  Respiratory:  no shortness of breath, + home oxygen, no productive cough, no dry cough, no bronchitis, no wheezing, no hemoptysis, no asthma, no pain with inspiration or cough, + sleep apnea, no CPAP at night  GI:   no difficulty swallowing, no reflux, no frequent heartburn, no hiatal hernia, no abdominal pain, no constipation, no diarrhea, no hematochezia, no hematemesis, no melena  GU:   no dysuria,  no frequency, no urinary tract infection, no hematuria, no enlarged prostate, no kidney stones, + chronic kidney  disease  Vascular:  no pain suggestive of claudication, + pain in feet, no leg cramps, no varicose veins, no DVT, no non-healing foot ulcer  Neuro:   no stroke, no TIA's, no seizures, no headaches, no temporary blindness one eye,  no slurred speech, no peripheral neuropathy, no chronic pain, no instability of gait, no memory/cognitive dysfunction  Musculoskeletal: no arthritis, no joint swelling, no myalgias, no difficulty walking, normal mobility   Skin:   no rash, no itching, no skin infections, no pressure sores or ulcerations  Psych:   no anxiety, no depression, no nervousness, no unusual recent stress  Eyes:   no blurry vision, no floaters, no recent vision changes, no  glasses or contacts  ENT:   no hearing loss, + loose or painful teeth, no dentures, last saw dentist years ago  Hematologic:  no easy bruising, no abnormal bleeding, no clotting disorder, no frequent epistaxis  Endocrine:  + diabetes, does check CBG's at home     Physical Exam:   BP 108/69   Pulse 81   Resp 18   Ht 6\' 1"  (1.854 m)   Wt 247 lb (112 kg)   SpO2 95% Comment: RA  BMI 32.59 kg/m   General:  Obese,  well-appearing  HEENT:  Unremarkable, NCAT, PERLA, EOMI  Neck:   no JVD, no bruits, no adenopathy   Chest:   clear to auscultation, symmetrical breath sounds, no wheezes, no rhonchi   CV:   RRR, 3/6 systolic murmur RSB, no diastolic murmur  Abdomen:  soft, non-tender, no masses   Extremities:  warm, well-perfused, pedal pulses not palpable, mild lower extremity edema  Rectal/GU  Deferred  Neuro:   Grossly non-focal and symmetrical throughout  Skin:   Clean and dry, no rashes, no breakdown  Diagnostic Tests:  ECHOCARDIOGRAM REPORT       Patient Name:   Steven Henry Date of Exam: 10/26/2022  Medical Rec #:  [id number]           Height:       73.0 in  Accession #:    [id number]          Weight:       286.6 lb  Date of Birth:  14-Feb-1951           BSA:          2.507 m  Patient Age:    71 years             BP:           104/65 mmHg  Patient Gender: M                   HR:           77 bpm.  [facility]   Procedure: 2D Echo, Cardiac Doppler, Color Doppler and Intracardiac             Opacification Agent   Indications:    Congestive Heart Failure I50.9    History:        Patient has prior history of Echocardiogram examinations,  most                 recent 02/26/2022. Pacemaker, Arrythmias:Atrial  Fibrillation,                 Signs/Symptoms:Dyspnea; Risk Factors:Sleep Apnea, Current                  Smoker, Diabetes, Hypertension and Dyslipidemia.    Sonographer:    Aron Baba  Referring Phys: [id number] ASIA B ZIERLE-GHOSH     Sonographer Comments: Technically difficult study due to poor echo  windows. Image acquisition challenging due to patient body habitus, Image  acquisition challenging due to respiratory motion and Image acquisition  challenging due to COPD.  IMPRESSIONS     1. Left ventricular ejection fraction, by estimation, is 55 to 60%. The  left ventricle has normal function. The left ventricle has no regional  wall motion abnormalities. Left ventricular diastolic function could not  be evaluated.   2. Right ventricular systolic function is mildly reduced. The right  ventricular size is mildly enlarged. There is mildly elevated pulmonary  artery systolic pressure. The estimated right ventricular systolic  pressure is 43.5 mmHg.   3. Left atrial size was severely dilated.   4. Right atrial size was moderately dilated.   5. The mitral valve is grossly normal. No evidence of mitral valve  regurgitation. No evidence of mitral stenosis.   6. The aortic valve is calcified. There is moderate calcification of the  aortic valve. Aortic valve regurgitation is not visualized. Moderate to  severe aortic valve stenosis. Aortic valve area, by VTI measures 1.27 cm.  Aortic valve mean gradient  measures 34.5 mmHg. Aortic valve Vmax measures 4.15 m/s.   7.  Aortic dilatation noted. There is borderline dilatation of the aortic  root, measuring 39 mm. There is mild dilatation of the ascending aorta,  measuring 40 mm.   8. The inferior vena cava is dilated in size with <50% respiratory  variability, suggesting right atrial pressure of 15 mmHg.   Comparison(s): No significant change from prior study.   FINDINGS   Left Ventricle: Left ventricular ejection fraction, by estimation, is 55  to 60%. The left ventricle has normal function. The left ventricle has no  regional wall motion abnormalities. Definity contrast agent was given IV  to delineate the left ventricular   endocardial borders. The left ventricular internal cavity size was normal  in size. There is no left ventricular hypertrophy. Left ventricular  diastolic function could not be evaluated due to atrial fibrillation. Left  ventricular diastolic function could   not be evaluated.   Right Ventricle: The right ventricular size is mildly enlarged. No  increase in right ventricular wall thickness. Right ventricular systolic  function is mildly reduced. There is mildly elevated pulmonary artery  systolic pressure. The tricuspid regurgitant   velocity is 2.67 m/s, and with an assumed right atrial pressure of 15  mmHg, the estimated right  ventricular systolic pressure is 43.5 mmHg.   Left Atrium: Left atrial size was severely dilated.   Right Atrium: Right atrial size was moderately dilated.   Pericardium: There is no evidence of pericardial effusion.   Mitral Valve: The mitral valve is grossly normal. No evidence of mitral  valve regurgitation. No evidence of mitral valve stenosis.   Tricuspid Valve: The tricuspid valve is not well visualized. Tricuspid  valve regurgitation is not demonstrated. No evidence of tricuspid  stenosis.   Aortic Valve: The aortic valve is calcified. There is moderate  calcification of the aortic valve. Aortic valve regurgitation is not  visualized.  Moderate to severe aortic stenosis is present. Aortic valve  mean gradient measures 34.5 mmHg. Aortic valve  peak gradient measures 68.9 mmHg. Aortic valve area, by VTI measures 1.27  cm.   Pulmonic Valve: The pulmonic valve was not well visualized. Pulmonic valve  regurgitation is not visualized. No evidence of pulmonic stenosis.   Aorta: Aortic dilatation noted. There is borderline dilatation of the  aortic root, measuring 39 mm. There is mild dilatation of the ascending  aorta, measuring 40 mm.   Venous: The inferior vena cava is dilated in size with less than 50%  respiratory variability, suggesting right atrial pressure of 15 mmHg.   IAS/Shunts: No atrial level shunt detected by color flow Doppler.     LEFT VENTRICLE  PLAX 2D  LVIDd:         5.30 cm   Diastology  LVIDs:         4.10 cm   LV e' medial:    7.30 cm/s  LV PW:         1.20 cm   LV E/e' medial:  14.2  LV IVS:        1.00 cm   LV e' lateral:   11.00 cm/s  LVOT diam:     2.50 cm   LV E/e' lateral: 9.5  LV SV:         96  LV SV Index:   38  LVOT Area:     4.91 cm     RIGHT VENTRICLE  RV S prime:     9.48 cm/s  TAPSE (M-mode): 1.5 cm   LEFT ATRIUM              Index        RIGHT ATRIUM           Index  LA diam:        5.90 cm  2.35 cm/m   RA Area:     29.50 cm  LA Vol (A2C):   147.0 ml 58.63 ml/m  RA Volume:   106.00 ml 42.28 ml/m  LA Vol (A4C):   138.0 ml 55.04 ml/m  LA Biplane Vol: 154.0 ml 61.43 ml/m   AORTIC VALVE  AV Area (Vmax):    1.41 cm  AV Area (Vmean):   1.35 cm  AV Area (VTI):     1.27 cm  AV Vmax:           415.00 cm/s  AV Vmean:          276.000 cm/s  AV VTI:            0.758 m  AV Peak Grad:      68.9 mmHg  AV Mean Grad:      34.5 mmHg  LVOT Vmax:         119.00 cm/s  LVOT Vmean:  75.800 cm/s  LVOT VTI:          0.196 m  LVOT/AV VTI ratio: 0.26    AORTA  Ao Root diam: 3.90 cm  Ao Asc diam:  4.00 cm   MITRAL VALVE                TRICUSPID VALVE  MV Area (PHT): 2.90  cm     TR Peak grad:   28.5 mmHg  MV Decel Time: 262 msec     TR Vmax:        267.00 cm/s  MR Peak grad: 98.0 mmHg  MR Vmax:      495.00 cm/s   SHUNTS  MV E velocity: 104.00 cm/s  Systemic VTI:  0.20 m                              Systemic Diam: 2.50 cm   Vishnu Priya Mallipeddi  Electronically signed by Winfield Rast Mallipeddi  Signature Date/Time: 10/26/2022/1:40:59 PM        Final      Physicians  Panel Physicians Referring Physician Case Authorizing Physician  [name], MD (Primary)     Procedures  RIGHT/LEFT HEART CATH AND CORONARY ANGIOGRAPHY   Conclusion  1.  Patent coronary arteries, codominant coronary anatomy, with no significant stenosis 2.  Essentially normal right and left heart diastolic filling pressures and preserved cardiac output 3.  Moderately severe aortic stenosis with mean gradient 27 mmHg and calculated valve area 1.16 cm, indexed valve area 0.47   Recommendations: Continue evaluation for TAVR in this symptomatic patient   Surgeon Notes    01/09/2023 10:19 AM CV Procedure signed by Jake Bathe, MD   Indications  Severe aortic stenosis [I35.0 (ICD-10-CM)]   Procedural Details  Technical Details INDICATION: Severe aortic stenosis - Pre-TAVR evaluation  PROCEDURAL DETAILS: The right antecubital fossa is prepped, draped, and anesthetized with 1% lidocaine.  Using direct ultrasound guidance, the right brachial vein is accessed via a front wall puncture.  A 4/5 French slender sheath is inserted.  The right wrist was then prepped, draped, and anesthetized with 1% lidocaine. Using vascular ultrasound guidance a 5/6 French Slender sheath was placed in the right radial artery. Intra-arterial verapamil was administered through the radial artery sheath. IV heparin was administered after a JR4 catheter was advanced into the central aorta. A Swan-Ganz catheter was used for the right heart catheterization. Standard protocol was followed for recording of  right heart pressures and sampling of oxygen saturations. Fick cardiac output was calculated.  Caution was used to avoid pacemaker leads.  Standard Judkins catheters were used for selective coronary angiography. LV pressure is recorded and an aortic valve pullback is performed. There were no immediate procedural complications. The patient was transferred to the post catheterization recovery area for further monitoring.      Estimated blood loss <50 mL.   During this procedure medications were administered to achieve and maintain moderate conscious sedation while the patient's heart rate, blood pressure, and oxygen saturation were continuously monitored and I was present face-to-face 100% of this time.   Medications (Filter: Administrations occurring from [phone] to 0937 on 02/11/23)  important  Continuous medications are totaled by the amount administered until 02/11/23 0937.   fentaNYL (SUBLIMAZE) injection (mcg)  Total dose: 25 mcg Date/Time Rate/Dose/Volume Action   02/11/23 0852 25 mcg Given   midazolam (VERSED) injection (mg)  Total dose: 2 mg Date/Time Rate/Dose/Volume Action  02/11/23 0852 2 mg Given   Heparin (Porcine) in NaCl 1000-0.9 UT/500ML-% SOLN (mL)  Total volume: 1,000 mL Date/Time Rate/Dose/Volume Action   02/11/23 0854 500 mL Given   0854 500 mL Given   lidocaine (PF) (XYLOCAINE) 1 % injection (mL)  Total volume: 10 mL Date/Time Rate/Dose/Volume Action   02/11/23 0904 10 mL Given   Radial Cocktail/Verapamil only (mL)  Total volume: 10 mL Date/Time Rate/Dose/Volume Action   02/11/23 0908 10 mL Given   heparin sodium (porcine) injection (Units)  Total dose: 5,000 Units Date/Time Rate/Dose/Volume Action   02/11/23 0914 5,000 Units Given   iohexol (OMNIPAQUE) 350 MG/ML injection (mL)  Total volume: 45 mL Date/Time Rate/Dose/Volume Action   02/11/23 0933 45 mL Given    Sedation Time  Sedation Time Physician-1: 36 minutes 4 seconds Contrast      Administrations occurring from 0843 to 0937 on 02/11/23:  Medication Name Total Dose  iohexol (OMNIPAQUE) 350 MG/ML injection 45 mL   Radiation/Fluoro  Fluoro time: 6.3 (min) DAP: 35.8 (Gycm2) Cumulative Air Kerma: 530.3 (mGy) Complications  Complications documented before study signed (02/11/2023  9:42 AM)   No complications were associated with this study.  Documented by Lidia Collum, RCIS - 02/11/2023  8:52 AM     Coronary Findings  Diagnostic Dominance: Co-dominant Left Main  The vessel exhibits minimal luminal irregularities. Left main is patent with no significant stenosis. It divides into the LAD and left circumflex.    Left Anterior Descending  The LAD is patent to the apex with minimal irregularity. The diagonal branches are patent with no stenosis.    Left Circumflex  Left circumflex is patent. It is a codominant vessel and supplies 3 OM branches and a left PDA. There is mild irregularity but no significant stenosis present.    Right Coronary Artery  There is mild diffuse disease throughout the vessel. Patent vessel with mild diffuse plaquing in the mid and distal portions of the vessel  Prox RCA to Mid RCA lesion is 50% stenosed.    Intervention   No interventions have been documented.   Left Heart  Aortic Valve There is moderate aortic valve stenosis. The aortic valve is calcified. There is restricted aortic valve motion.   Coronary Diagrams  Diagnostic Dominance: Co-dominant  Intervention   Implants   No implant documentation for this case.   Syngo Images   Show images for CARDIAC CATHETERIZATION Images on Long Term Storage   Show images for [place], [place] to Procedure Log  Procedure Log    Hemo Data  Flowsheet Row Most Recent Value  Fick Cardiac Output 5.05 L/min  Fick Cardiac Output Index 2.06 (L/min)/BSA  Aortic Mean Gradient 26.86 mmHg  Aortic Peak Gradient 22.9 mmHg  Aortic Valve Area 1.16  Aortic Value Area Index  0.47 cm2/BSA  RA A Wave 11 mmHg  RA V Wave 10 mmHg  RA Mean 7 mmHg  RV Systolic Pressure 42 mmHg  RV Diastolic Pressure 0 mmHg  RV EDP 11 mmHg  PA Systolic Pressure 35 mmHg  PA Diastolic Pressure 18 mmHg  PA Mean 27 mmHg  PW A Wave 12 mmHg  PW V Wave 13 mmHg  PW Mean 10 mmHg  AO Systolic Pressure 120 mmHg  AO Diastolic Pressure 73 mmHg  AO Mean 92 mmHg  LV Systolic Pressure 149 mmHg  LV Diastolic Pressure 7 mmHg  LV EDP 13 mmHg  AOp Systolic Pressure 115 mmHg  AOp Diastolic Pressure 68 mmHg  AOp Mean  Pressure 87 mmHg  LVp Systolic Pressure 145 mmHg  LVp Diastolic Pressure 36 mmHg  LVp EDP Pressure 15 mmHg  QP/QS 1  TPVR Index 13.09 HRUI  TSVR Index 44.59 HRUI  PVR SVR Ratio 0.2  TPVR/TSVR Ratio 0.29   ADDENDUM REPORT: 01/21/2023 22:20   CLINICAL DATA:  37M with severe aortic stenosis being evaluated for a TAVR procedure.   EXAM: Cardiac TAVR CT   TECHNIQUE: The patient was scanned on a [employer]. A 120 kV retrospective scan was triggered in the descending thoracic aorta at 111 HU's. Gantry rotation speed was 250 msecs and collimation was .6 mm. No beta blockade or nitro were given. The 3D data set was reconstructed in 5% intervals of the R-R cycle. Systolic and diastolic phases were analyzed on a dedicated work station using MPR, MIP and VRT modes. The patient received 100 cc of contrast.   FINDINGS: Aortic Root:   Aortic valve: Trileaflet   Aortic valve calcium score: 3071   Aortic annulus:   Diameter: 32mm x 25mm   Perimeter: 88mm   Area: 584 mm^2   Calcifications: No calcifications   Coronary height: Min Left - 21mm, Min Right - 21mm   Sinotubular height: Left cusp - 30mm; Right cusp - 29mm; Noncoronary cusp - 27mm   LVOT (as measured 3 mm below the annulus):   Diameter: 33mm x 24mm   Area: 612 mm^2   Calcifications: No calcifications   Aortic sinus width: Left cusp - 38mm; Right cusp - 36mm; Noncoronary cusp - 37mm    Sinotubular junction width: 33mm x 32mm   Optimum Fluoroscopic Angle for Delivery: LAO 18 CRA 17   Cardiac:   Right atrium: Mild enlargement   Right ventricle: Mild enlargement.  S/p PPM   Pulmonary arteries: Dilated main PA measuring 31mm   Pulmonary veins: Normal configuration   Left atrium: Moderate enlargement   Left ventricle: Normal size   Pericardium: Normal thickness   Coronary arteries: Normal origins. Coronary calcium score 447 (69th percentile)   IMPRESSION: 1. Trileaflet aortic valve with severe calcifications (AV calcium score 3071)   2. Aortic annulus measures 32mm x 25mm in diameter with perimeter 88mm and area 584 mm^2. No annular or LVOT calcifications. Annular measurements are suitable for delivery of 29mm Edwards Sapien 3 valve   3.  Sufficient coronary to annulus distance.   4.  Optimum Fluoroscopic Angle for Delivery: LAO 18 CRA 17   5.  Coronary calcium score 447 (69th percentile)     Electronically Signed   By: Epifanio Lesches M.D.   On: 01/21/2023 22:20    Addended by Little Ishikawa, MD on 01/21/2023 10:22 PM    Study Result  Narrative & Impression  EXAM: OVER-READ INTERPRETATION  CT CHEST   The following report is a limited chest CT over-read performed by radiologist Dr. Allegra Lai of [facility] on 01/21/2023. This over-read does not include interpretation of cardiac or coronary anatomy or pathology. The cardiac TAVR interpretation by the cardiologist is attached.   COMPARISON:  None Available.   FINDINGS: Extracardiac findings will be described separately under dictation for contemporaneously obtained CTA chest, abdomen and pelvis.   IMPRESSION: Please see separate dictation for contemporaneously obtained CTA chest, abdomen and pelvis dated 01/21/2023 for full description of relevant extracardiac findings.   Electronically Signed: By: Allegra Lai M.D. On: 01/21/2023 12:27        Narrative & Impression  CLINICAL DATA:  Aortic valve replacement preop evaluation  EXAM: CT ANGIOGRAPHY CHEST, ABDOMEN AND PELVIS   TECHNIQUE: Non-contrast CT of the chest was initially obtained.   Multidetector CT imaging through the chest, abdomen and pelvis was performed using the standard protocol during bolus administration of intravenous contrast. Multiplanar reconstructed images and MIPs were obtained and reviewed to evaluate the vascular anatomy.   RADIATION DOSE REDUCTION: This exam was performed according to the departmental dose-optimization program which includes automated exposure control, adjustment of the mA and/or kV according to patient size and/or use of iterative reconstruction technique.   CONTRAST:  OMNIPAQUE IOHEXOL 350 MG/ML SOLN   COMPARISON:  CT abdomen and pelvis dated June 14, 2013   FINDINGS: CTA CHEST FINDINGS   Cardiovascular: Normal heart size. No pericardial effusion. Normal caliber thoracic aorta with moderate atherosclerotic disease. Aortic valve thickening and calcifications. Coronary artery calcifications of the LAD and circumflex.   Mediastinum/Nodes: Thyroid and esophagus are unremarkable. No enlarged lymph nodes seen in the chest.   Lungs/Pleura: Central airways are patent. No consolidation, pleural effusion or pneumothorax. Mild paraseptal emphysema. Mild subpleural reticular opacities.   Musculoskeletal: No chest wall abnormality. No acute or significant osseous findings.   CTA ABDOMEN AND PELVIS FINDINGS   Hepatobiliary: No suspicious liver lesions. Gallstones. No gallbladder wall thickening. No biliary ductal dilation.   Pancreas: Unremarkable. No pancreatic ductal dilatation or surrounding inflammatory changes.   Spleen: Normal in size without focal abnormality.   Adrenals/Urinary Tract: Bilateral adrenal gland adenomas, unchanged when compared with 2014 prior, no specific follow-up imaging  is recommended. No hydronephrosis or nephrolithiasis. Bladder is unremarkable.   Stomach/Bowel: Stomach is within normal limits. Appendix appears normal. Diverticulosis. No evidence of bowel wall thickening, distention, or inflammatory changes.   Vascular/lymphatic: Normal caliber abdominal aorta with moderate atherosclerotic disease causing moderate narrowing of the proximal bilateral renal arteries and SMA. No enlarged lymph nodes seen in the abdomen or pelvis.   Reproductive: Prostate is unremarkable.   Other: Small right-greater-than-left fat containing inguinal hernias. No abdominopelvic ascites.   Musculoskeletal: No acute or significant osseous findings.   VASCULAR MEASUREMENTS PERTINENT TO TAVR:   AORTA:   Minimal Aortic Diameter-16.8 mm   Severity of Aortic Calcification-moderate   RIGHT PELVIS:   Right Common Iliac Artery -   Minimal Diameter-7.0 mm   Tortuosity-moderate   Calcification-severe   Right External Iliac Artery -   Minimal Diameter-6.3 mm   Tortuosity-mild   Calcification-severe   Right Common Femoral Artery -   Minimal Diameter-6.5 mm   Tortuosity-none   Calcification-severe   LEFT PELVIS:   Left Common Iliac Artery -   Minimal Diameter-6.0 mm   Tortuosity-moderate   Calcification-severe   Left External Iliac Artery -   Minimal Diameter-5.0 mm   Tortuosity-moderate   Calcification-moderate   Left Common Femoral Artery -   Minimal Diameter-5.7 mm   Tortuosity-none   Calcification-severe   Review of the MIP images confirms the above findings.   IMPRESSION: Vascular:   1. Vascular findings and measurements pertinent to potential TAVR procedure, as detailed above. 2. Thickening and calcification of the aortic valve, compatible with reported clinical history of aortic stenosis. 3. Moderate aortic and severe iliofemoral atherosclerosis. Two coronary artery disease.   Nonvascular:   1. Mild subpleural  reticular opacities, findings can be seen in setting of interstitial lung disease. Correlate with patient's systems and consider pulmonary function tests and/or ILD protocol CT.     Electronically Signed   By: Allegra Lai M.D.   On: 01/21/2023 12:26  Impression:  This 72 year old gentleman has stage D, severe, symptomatic aortic stenosis with NYHA class II symptoms of exertional fatigue consistent with chronic diastolic congestive heart failure.  He has had 3 admissions for acute congestive heart failure over the past year.  I have personally reviewed his 2D echocardiogram, cardiac catheterization, and CTA studies.  His echocardiogram shows a calcified and thickened aortic valve with restricted leaflet mobility.  The mean gradient was 34.5 mmHg with a peak velocity of 4.15 m/s.  Cardiac catheterization showed no significant coronary stenosis.  The mean gradient across the aortic valve was measured at 27 mmHg.  I agree that aortic valve replacement is indicated in this patient for relief of his symptoms and to prevent recurrent episodes of acute congestive heart failure and left ventricular deterioration.  Given his age and comorbidities I think that transcatheter aortic valve replacement would be the best option for treating him.  His gated cardiac CTA shows anatomy suitable for TAVR using a 29 mm SAPIEN 3 valve.  His abdominal and pelvic CTA shows adequate pelvic vascular anatomy to allow transfemoral insertion.  The patient was counseled at length regarding treatment alternatives for management of severe symptomatic aortic stenosis. The risks and benefits of surgical intervention has been discussed in detail. Long-term prognosis with medical therapy was discussed. Alternative approaches such as conventional surgical aortic valve replacement, transcatheter aortic valve replacement, and palliative medical therapy were compared and contrasted at length. This discussion was placed in the context  of the patient's own specific clinical presentation and past medical history. All of his questions have been addressed.   Following the decision to proceed with transcatheter aortic valve replacement, a discussion was held regarding what types of management strategies would be attempted intraoperatively in the event of life-threatening complications, including whether or not the patient would be considered a candidate for the use of cardiopulmonary bypass and/or conversion to open sternotomy for attempted surgical intervention.  Given his morbid obesity, ongoing smoking, moderately calcified ascending aorta, resting hypercarbia and hypoxemia on ABG and other comorbidities I do not think he would be a good candidate for emergent sternotomy to manage any intraoperative complications.  The patient is aware of the fact that transient use of cardiopulmonary bypass may be necessary. The patient has been advised of a variety of complications that might develop including but not limited to risks of death, stroke, paravalvular leak, aortic dissection or other major vascular complications, aortic annulus rupture, device embolization, cardiac rupture or perforation, mitral regurgitation, acute myocardial infarction, arrhythmia, heart block or bradycardia requiring permanent pacemaker placement, congestive heart failure, respiratory failure, renal failure, pneumonia, infection, other late complications related to structural valve deterioration or migration, or other complications that might ultimately cause a temporary or permanent loss of functional independence or other long term morbidity. The patient provides full informed consent for the procedure as described and all questions were answered.      Plan:  He said that he is scheduled to see a dentist on April 01, 2023 and notes some bad teeth that need to be looked at.  He will be scheduled for transfemoral TAVR after that.  I spent 60 minutes performing this  consultation and > 50% of this time was spent face to face counseling and coordinating the care of this patient's severe symptomatic aortic stenosis.  Alleen Borne, MD 03/06/2023

## 2023-03-19 ENCOUNTER — Telehealth: Payer: Self-pay | Admitting: Gastroenterology

## 2023-03-19 ENCOUNTER — Ambulatory Visit: Payer: Medicare Other

## 2023-03-19 DIAGNOSIS — I5032 Chronic diastolic (congestive) heart failure: Secondary | ICD-10-CM | POA: Diagnosis not present

## 2023-03-19 NOTE — Telephone Encounter (Signed)
Appears patient is undergoing evaluation with cardiac surgery for consideration of TAVR therefore lets place the patient on recall for an office visit in early September to see if he has had procedures done and reassess cardiac status prior to considering any procedures.  Brooke Bonito, MSN, APRN, FNP-BC, AGACNP-BC [facility] at [facility]

## 2023-03-20 NOTE — Telephone Encounter (Signed)
Added to recall

## 2023-03-25 LAB — CUP PACEART REMOTE DEVICE CHECK
Battery Remaining Longevity: 155 mo
Battery Voltage: 3.17 V
Brady Statistic AP VP Percent: 36.11 %
Brady Statistic AP VS Percent: 1.6 %
Brady Statistic AS VP Percent: 46.9 %
Brady Statistic AS VS Percent: 15.38 %
Brady Statistic RA Percent Paced: 37.43 %
Brady Statistic RV Percent Paced: 83.02 %
Date Time Interrogation Session: 20240628171409
Implantable Lead Connection Status: 753985
Implantable Lead Connection Status: 753985
Implantable Lead Implant Date: 20231222
Implantable Lead Implant Date: 20231222
Implantable Lead Location: 753859
Implantable Lead Location: 753860
Implantable Lead Model: 3830
Implantable Lead Model: 5076
Implantable Pulse Generator Implant Date: 20231222
Lead Channel Impedance Value: 380 Ohm
Lead Channel Impedance Value: 418 Ohm
Lead Channel Impedance Value: 456 Ohm
Lead Channel Impedance Value: 570 Ohm
Lead Channel Pacing Threshold Amplitude: 0.75 V
Lead Channel Pacing Threshold Amplitude: 0.75 V
Lead Channel Pacing Threshold Pulse Width: 0.4 ms
Lead Channel Pacing Threshold Pulse Width: 0.4 ms
Lead Channel Sensing Intrinsic Amplitude: 16 mV
Lead Channel Sensing Intrinsic Amplitude: 16 mV
Lead Channel Sensing Intrinsic Amplitude: 4.375 mV
Lead Channel Sensing Intrinsic Amplitude: 4.375 mV
Lead Channel Setting Pacing Amplitude: 1.5 V
Lead Channel Setting Pacing Amplitude: 2 V
Lead Channel Setting Pacing Pulse Width: 0.4 ms
Lead Channel Setting Sensing Sensitivity: 0.9 mV
Zone Setting Status: 755011

## 2023-03-28 ENCOUNTER — Other Ambulatory Visit: Payer: Self-pay | Admitting: Family Medicine

## 2023-03-28 DIAGNOSIS — E1100 Type 2 diabetes mellitus with hyperosmolarity without nonketotic hyperglycemic-hyperosmolar coma (NKHHC): Secondary | ICD-10-CM

## 2023-03-28 DIAGNOSIS — N183 Chronic kidney disease, stage 3 unspecified: Secondary | ICD-10-CM

## 2023-03-29 NOTE — Telephone Encounter (Signed)
Requested Prescriptions  Pending Prescriptions Disp Refills   JARDIANCE 25 MG TABS tablet [Pharmacy Med Name: Jardiance 25 mg tablet] 90 tablet 0    Sig: TAKE 1 TABLET BY MOUTH DAILY BEFORE breakfast     Endocrinology:  Diabetes - SGLT2 Inhibitors Failed - 03/28/2023  8:03 AM      Failed - HBA1C is between 0 and 7.9 and within 180 days    Hgb A1c MFr Bld  Date Value Ref Range Status  12/07/2022 8.4 (H) <5.7 % of total Hgb Final    Comment:    For someone without known diabetes, a hemoglobin A1c value of 6.5% or greater indicates that they may have  diabetes and this should be confirmed with a follow-up  test. . For someone with known diabetes, a value <7% indicates  that their diabetes is well controlled and a value  greater than or equal to 7% indicates suboptimal  control. A1c targets should be individualized based on  duration of diabetes, age, comorbid conditions, and  other considerations. . Currently, no consensus exists regarding use of hemoglobin A1c for diagnosis of diabetes for children. .          Failed - Valid encounter within last 6 months    Recent Outpatient Visits           1 year ago Acute pain of left knee   [facility]ard, Priscille Heidelberg, MD   1 year ago Rib pain on left side   [facility] Pickard, Priscille Heidelberg, MD   1 year ago Rib pain on left side   [facility] Valentino Nose, NP   1 year ago Uncontrolled type 2 diabetes mellitus with hyperosmolarity without coma, without long-term current use of insulin (HCC)   [facility] Donita Brooks, MD   2 years ago Acute pulmonary edema (HCC)   [facility] Donita Brooks, MD       Future Appointments             In 2 months Sharlene Dory, NP [facility] at [place], [place]            Passed - Cr in normal range and within 360 days    Creat  Date Value Ref Range Status  12/07/2022 0.93 0.70 -  1.28 mg/dL Final   Creatinine, Ser  Date Value Ref Range Status  02/11/2023 1.06 0.61 - 1.24 mg/dL Final   Creatinine, Urine  Date Value Ref Range Status  08/22/2022 37 20 - 320 mg/dL Final         Passed - eGFR in normal range and within 360 days    GFR, Est African American  Date Value Ref Range Status  08/01/2020 101 > OR = 60 mL/min/1.27m2 Final   GFR, Est Non African American  Date Value Ref Range Status  08/01/2020 87 > OR = 60 mL/min/1.75m2 Final   GFR, Estimated  Date Value Ref Range Status  02/11/2023 >60 >60 mL/min Final    Comment:    (NOTE) Calculated using the CKD-EPI Creatinine Equation (2021)    eGFR  Date Value Ref Range Status  12/07/2022 88 > OR = 60 mL/min/1.14m2 Final

## 2023-04-02 DIAGNOSIS — J9601 Acute respiratory failure with hypoxia: Secondary | ICD-10-CM | POA: Diagnosis not present

## 2023-04-10 NOTE — Progress Notes (Signed)
 Remote pacemaker transmission.   

## 2023-04-27 ENCOUNTER — Other Ambulatory Visit: Payer: Self-pay | Admitting: Family Medicine

## 2023-04-27 DIAGNOSIS — I5032 Chronic diastolic (congestive) heart failure: Secondary | ICD-10-CM

## 2023-04-27 DIAGNOSIS — E1100 Type 2 diabetes mellitus with hyperosmolarity without nonketotic hyperglycemic-hyperosmolar coma (NKHHC): Secondary | ICD-10-CM

## 2023-05-03 DIAGNOSIS — J9601 Acute respiratory failure with hypoxia: Secondary | ICD-10-CM | POA: Diagnosis not present

## 2023-05-08 ENCOUNTER — Other Ambulatory Visit: Payer: Self-pay | Admitting: Family Medicine

## 2023-05-08 DIAGNOSIS — E1100 Type 2 diabetes mellitus with hyperosmolarity without nonketotic hyperglycemic-hyperosmolar coma (NKHHC): Secondary | ICD-10-CM

## 2023-05-08 DIAGNOSIS — I5032 Chronic diastolic (congestive) heart failure: Secondary | ICD-10-CM

## 2023-05-09 NOTE — Telephone Encounter (Signed)
Requested Prescriptions  Pending Prescriptions Disp Refills   Semaglutide, 2 MG/DOSE, (OZEMPIC, 2 MG/DOSE,) 8 MG/3ML SOPN [Pharmacy Med Name: Ozempic 2 mg/dose (8 mg/3 mL) subcutaneous pen injector] 3 mL 0    Sig: INJECT 2 MG UNDER THE SKIN WEEKLY     Endocrinology:  Diabetes - GLP-1 Receptor Agonists - semaglutide Failed - 05/08/2023  9:14 AM      Failed - HBA1C in normal range and within 180 days    Hgb A1c MFr Bld  Date Value Ref Range Status  12/07/2022 8.4 (H) <5.7 % of total Hgb Final    Comment:    For someone without known diabetes, a hemoglobin A1c value of 6.5% or greater indicates that they may have  diabetes and this should be confirmed with a follow-up  test. . For someone with known diabetes, a value <7% indicates  that their diabetes is well controlled and a value  greater than or equal to 7% indicates suboptimal  control. A1c targets should be individualized based on  duration of diabetes, age, comorbid conditions, and  other considerations. . Currently, no consensus exists regarding use of hemoglobin A1c for diagnosis of diabetes for children. .          Failed - Valid encounter within last 6 months    Recent Outpatient Visits           1 year ago Acute pain of left knee   [facility] Pickard, Priscille Heidelberg, MD   1 year ago Rib pain on left side   [facility] Pickard, Priscille Heidelberg, MD   1 year ago Rib pain on left side   [facility] Valentino Nose, NP   1 year ago Uncontrolled type 2 diabetes mellitus with hyperosmolarity without coma, without long-term current use of insulin (HCC)   [facility] Donita Brooks, MD   2 years ago Acute pulmonary edema (HCC)   [facility] Pickard, Priscille Heidelberg, MD       Future Appointments             In 2 weeks  [facility]   In 1 month Sharlene Dory, NP  [facility] at [place], [place]             Passed - Cr in normal range and within 360 days    Creat  Date Value Ref Range Status  12/07/2022 0.93 0.70 - 1.28 mg/dL Final   Creatinine, Ser  Date Value Ref Range Status  02/11/2023 1.06 0.61 - 1.24 mg/dL Final   Creatinine, Urine  Date Value Ref Range Status  08/22/2022 37 20 - 320 mg/dL Final

## 2023-05-10 ENCOUNTER — Telehealth: Payer: Self-pay

## 2023-05-10 NOTE — Telephone Encounter (Signed)
Did you speak with [facility] yesterday; they need him to have some labs done? Thank you.

## 2023-05-15 ENCOUNTER — Other Ambulatory Visit: Payer: Medicare Other

## 2023-05-15 DIAGNOSIS — E1165 Type 2 diabetes mellitus with hyperglycemia: Secondary | ICD-10-CM | POA: Diagnosis not present

## 2023-05-15 DIAGNOSIS — I5032 Chronic diastolic (congestive) heart failure: Secondary | ICD-10-CM | POA: Diagnosis not present

## 2023-05-15 DIAGNOSIS — Z794 Long term (current) use of insulin: Secondary | ICD-10-CM | POA: Diagnosis not present

## 2023-05-15 DIAGNOSIS — N183 Chronic kidney disease, stage 3 unspecified: Secondary | ICD-10-CM | POA: Diagnosis not present

## 2023-05-16 LAB — CBC WITH DIFFERENTIAL/PLATELET
Absolute Monocytes: 949 {cells}/uL (ref 200–950)
Basophils Absolute: 45 {cells}/uL (ref 0–200)
Basophils Relative: 0.4 %
Eosinophils Absolute: 215 {cells}/uL (ref 15–500)
Eosinophils Relative: 1.9 %
HCT: 47.9 % (ref 38.5–50.0)
Hemoglobin: 16.5 g/dL (ref 13.2–17.1)
Lymphs Abs: 2475 {cells}/uL (ref 850–3900)
MCH: 32.4 pg (ref 27.0–33.0)
MCHC: 34.4 g/dL (ref 32.0–36.0)
MCV: 93.9 fL (ref 80.0–100.0)
MPV: 11.8 fL (ref 7.5–12.5)
Monocytes Relative: 8.4 %
Neutro Abs: 7616 {cells}/uL (ref 1500–7800)
Neutrophils Relative %: 67.4 %
Platelets: 266 10*3/uL (ref 140–400)
RBC: 5.1 10*6/uL (ref 4.20–5.80)
RDW: 12.6 % (ref 11.0–15.0)
Total Lymphocyte: 21.9 %
WBC: 11.3 10*3/uL — ABNORMAL HIGH (ref 3.8–10.8)

## 2023-05-16 LAB — COMPREHENSIVE METABOLIC PANEL
AG Ratio: 1.3 (calc) (ref 1.0–2.5)
ALT: 14 U/L (ref 9–46)
AST: 20 U/L (ref 10–35)
Albumin: 4.4 g/dL (ref 3.6–5.1)
Alkaline phosphatase (APISO): 65 U/L (ref 35–144)
BUN: 9 mg/dL (ref 7–25)
CO2: 26 mmol/L (ref 20–32)
Calcium: 9.6 mg/dL (ref 8.6–10.3)
Chloride: 96 mmol/L — ABNORMAL LOW (ref 98–110)
Creat: 0.93 mg/dL (ref 0.70–1.28)
Globulin: 3.5 g/dL (calc) (ref 1.9–3.7)
Glucose, Bld: 112 mg/dL — ABNORMAL HIGH (ref 65–99)
Potassium: 4 mmol/L (ref 3.5–5.3)
Sodium: 137 mmol/L (ref 135–146)
Total Bilirubin: 0.6 mg/dL (ref 0.2–1.2)
Total Protein: 7.9 g/dL (ref 6.1–8.1)

## 2023-05-21 ENCOUNTER — Other Ambulatory Visit: Payer: Self-pay

## 2023-05-21 DIAGNOSIS — I350 Nonrheumatic aortic (valve) stenosis: Secondary | ICD-10-CM

## 2023-05-23 ENCOUNTER — Encounter: Payer: Medicare Other | Admitting: Pharmacist

## 2023-05-23 NOTE — Progress Notes (Signed)
[facility]   MULTIDISCIPLINARY HEART VALVE CLINIC                                     Cardiology Office Note:    Date:  05/24/2023   ID:  Steven Henry, DOB March 21, 1951, MRN [id number]  PCP:  Donita Brooks, MD  [facility] Cardiologist:  Marjo Bicker, MD  [facility] Electrophysiologist:  None   Referring MD: Donita Brooks, MD   Follow up of aortic stenosis with TAVR planned for 06/25/23  History of Present Illness:    Steven Henry is a 72 y.o. 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   His most recent echocardiogram on 10/26/2022 showed EF 55% and severe AS with mean grad 34.5 mmHg, peak grad 68.9 mmHg, AVA 1.27 cm2, DVI 0.26, SVI 38. L/RHC 02/11/23 showed patent coronary arteries, codominant coronary anatomy, with no significant stenosis. He has essentially normal right and left heart diastolic filling pressures and preserved cardiac output and moderately severe aortic stenosis with mean gradient 27 mmHg and calculated valve area 1.16 cm, indexed valve area 0.47. His gated cardiac CTA showed an elevated aortic valve calcium score of 3071 and anatomy suitable for TAVR using a 29 mm SAPIEN 3 valve. His abdominal and pelvic CTA shows adequate pelvic vascular anatomy to allow transfemoral insertion. TAVR surgery has been delayed given need for several dental extractions.   Today the patient presents to clinic for follow up. Here alone. No CP or SOB. He is breathing much better since his last cardioversion. No LE edema, orthopnea or PND. No dizziness or syncope. No blood in stool or urine. No palpitations. He was originally planned for surgery 9/10 but pt cannot do this date anymore and prefers to push it out to the next available date with Dr.  Pati Gallo on 06/25/23.   Past Medical History:  Diagnosis Date   A-fib (HCC)    CHF (congestive heart failure) (HCC)    CKD (chronic kidney disease), stage III (HCC)    Colon polyps    Diabetes mellitus type II, uncontrolled    Diabetes mellitus without complication (HCC)    Hypertension    Morbid obesity (HCC)    PVD (peripheral vascular disease) (HCC)    right femoral artery   Sleep apnea    not on CPAP    Sleep apnea     Past Surgical History:  Procedure Laterality Date   CARDIOVERSION N/A 11/05/2022   Procedure: CARDIOVERSION;  Surgeon: Maisie Fus, MD;  Location: [facility] ENDOSCOPY;  Service: Cardiovascular;  Laterality: N/A;   CARDIOVERSION N/A 01/09/2023   Procedure: CARDIOVERSION;  Surgeon: Jake Bathe, MD;  Location: [facility] INVASIVE CV LAB;  Service: Cardiovascular;  Laterality: N/A;   COLONOSCOPY N/A 09/16/2015   Procedure: COLONOSCOPY;  Surgeon: Corbin Ade, MD;  Location: [facility] ENDO SUITE;  Service: Endoscopy;  Laterality: N/A;  8:15 AM - pt has c pap   ENDOVENOUS ABLATION SAPHENOUS VEIN W/ LASER Right 08/26/2017   endovenous laser ablation right greater saphenous vein by Josephina Gip MD    PACEMAKER IMPLANT N/A 09/14/2022   Procedure: PACEMAKER IMPLANT;  Surgeon: Duke Salvia,  MD;  Location: [facility] INVASIVE CV LAB;  Service: Cardiovascular;  Laterality: N/A;   RIGHT/LEFT HEART CATH AND CORONARY ANGIOGRAPHY N/A 02/11/2023   Procedure: RIGHT/LEFT HEART CATH AND CORONARY ANGIOGRAPHY;  Surgeon: [name], MD;  Location: [facility] INVASIVE CV LAB;  Service: Cardiovascular;  Laterality: N/A;   stab phlebectomy  Right 01/07/2018   stab phlebectomy 10-20 incisions right leg by Josephina Gip MD     Current Medications: Current Meds  Medication Sig   Accu-Chek FastClix Lancets MISC Check BS QAM - DX: E11.9   albuterol (VENTOLIN HFA) 108 (90 Base) MCG/ACT inhaler Inhale 2 puffs into the lungs every 6 (six) hours as needed for wheezing or shortness of breath.   atorvastatin  (LIPITOR) 40 MG tablet TAKE 1 TABLET BY MOUTH DAILY (Patient taking differently: Take 40 mg by mouth at bedtime.)   bisoprolol (ZEBETA) 5 MG tablet Take 1 tablet (5 mg total) by mouth daily. (Patient taking differently: No sig reported)   Blood Glucose Monitoring Suppl (ONE TOUCH ULTRA 2) w/Device KIT USETO CHECK BLOOD SUGAR EVERY MORNING   budesonide-formoterol (SYMBICORT) 160-4.5 MCG/ACT inhaler Inhale 2 puffs into the lungs in the morning and at bedtime. (Patient taking differently: Inhale 2 puffs into the lungs 2 (two) times daily as needed (shortness of breath).)   Cholecalciferol (VITAMIN D-3) 125 MCG (5000 UT) TABS Take 5,000 Units by mouth 2 (two) times daily.   Continuous Blood Gluc Receiver (FREESTYLE LIBRE 2 READER) DEVI For continuous blood glucose monitoring   Continuous Glucose Sensor (FREESTYLE LIBRE 2 SENSOR) MISC APPLY 1 SENSOR ON THE SKIN EVERY 14 DAYS, USE AS DIRECTED TO CHECK BLOOD SUGAR   ELIQUIS 5 MG TABS tablet TAKE 1 TABLET BY MOUTH TWICE DAILY   flecainide (TAMBOCOR) 100 MG tablet Take 1 tablet (100 mg total) by mouth 2 (two) times daily.   furosemide (LASIX) 40 MG tablet Take 2 tablets (80 mg total) by mouth 2 (two) times daily.   ibuprofen (ADVIL) 200 MG tablet Take 200 mg by mouth every 6 (six) hours as needed for mild pain or moderate pain.   insulin glargine (LANTUS) 100 UNIT/ML injection Inject 0.25 mLs (25 Units total) into the skin daily. PLEASE INCREASE INSULIN BY 1 UNIT DAILY UNTIL FBG IS <130, THEN HOLD AT THAT DOSE   ipratropium-albuterol (DUONEB) 0.5-2.5 (3) MG/3ML SOLN Take 3 mLs by nebulization 3 (three) times daily. (Patient taking differently: Take 3 mLs by nebulization every 4 (four) hours as needed (shortness of breath).)   JARDIANCE 25 MG TABS tablet TAKE 1 TABLET BY MOUTH DAILY BEFORE breakfast   Lancets Misc. (ACCU-CHEK FASTCLIX LANCET) KIT Check BS QAM - DX: E11.9   losartan (COZAAR) 50 MG tablet Take 1 tablet (50 mg total) by mouth daily.   metFORMIN  (GLUCOPHAGE) 1000 MG tablet TAKE 1 TABLET BY MOUTH TWICE DAILY WITH a meal   Multiple Vitamin (MULTIVITAMIN) capsule Take 1 capsule by mouth in the morning. Mens one a day   ONETOUCH ULTRA test strip USE TO CHECK BLOOD SUGAR EVERY MORNING   potassium chloride SA (KLOR-CON M20) 20 MEQ tablet Take 1 tablet by mouth twice a day for 3 days, then take 1 tablet by mouth daily. (Patient taking differently: Take 20 mEq by mouth daily.)   Semaglutide, 2 MG/DOSE, (OZEMPIC, 2 MG/DOSE,) 8 MG/3ML SOPN INJECT 2 MG UNDER THE SKIN WEEKLY   spironolactone (ALDACTONE) 25 MG tablet TAKE 1 TABLET BY MOUTH DAILY   Zinc 30 MG CAPS Take 30 mg by mouth daily.  ROS:   Please see the history of present illness.    All other systems reviewed and are negative.  EKGs   EKG:  EKG is NOT ordered today.   Recent Labs: 10/26/2022: Magnesium 2.5; TSH 0.902 11/01/2022: Brain Natriuretic Peptide 42 05/15/2023: ALT 14; BUN 9; Creat 0.93; Hemoglobin 16.5; Platelets 266; Potassium 4.0; Sodium 137  Recent Lipid Panel    Component Value Date/Time   CHOL 146 12/07/2022 0814   TRIG 77 12/07/2022 0814   HDL 59 12/07/2022 0814   CHOLHDL 2.5 12/07/2022 0814   VLDL 8 02/27/2022 0424   LDLCALC 71 12/07/2022 0814     Risk Assessment/Calculations:    CHA2DS2-VASc Score = 5   This indicates a 7.2% annual risk of stroke. The patient's score is based upon: CHF History: 1 HTN History: 1 Diabetes History: 1 Stroke History: 0 Vascular Disease History: 1 Age Score: 1 Gender Score: 0           Physical Exam:    VS:  BP 110/78   Pulse 78   Ht 6\' 1"  (1.854 m)   Wt 261 lb 3.2 oz (118.5 kg)   SpO2 97%   BMI 34.46 kg/m     Wt Readings from Last 3 Encounters:  05/24/23 261 lb 3.2 oz (118.5 kg)  03/06/23 247 lb (112 kg)  02/28/23 274 lb (124.3 kg)     GEN: Well nourished, well developed in no acute distress NECK: No JVD; No carotid bruits CARDIAC: RRR, 3/6 harsh SEM. No rubs, gallops RESPIRATORY:  Clear to  auscultation without rales, wheezing or rhonchi  ABDOMEN: Soft, non-tender, non-distended EXTREMITIES:  No edema; No deformity   ASSESSMENT:    1. Nonrheumatic aortic valve stenosis   2. Chronic heart failure with preserved ejection fraction (HCC)   3. Persistent atrial fibrillation (HCC)   4. Chronic obstructive pulmonary disease, unspecified COPD type (HCC)   5. Essential hypertension   6. Pacemaker    PLAN:    In order of problems listed above:  Severe AS: I have reviewed echo, cath and TAVR CTs which are c/w severe symptomatic AS. He has NYHA class II symptoms. He is scheduled for TAVR-TF on 06/25/23 with Drs Excell Seltzer and Laneta Simmers.   Chronic HFpEF: appears euvolemic today. Continue Jardiance 10mg  daily, losartan 50mg  daily sprio 25mg  daily and lasix 80mg  BID/Kdur daily.  Persistent atrial fibrillation: s/p two cardioversions. Continue flecainide 100mg  BID and bisoprolol 40mg  daily as well as Eliquis 5mg  BID.     COPD with ongoing smoking: has cut back a lot and wants to quit soon.   HTN: BP well controlled at 110/72 today. No changes made today.   S/p PPM: followed by Dr. Graciela Husbands  Medication Adjustments/Labs and Tests Ordered: Current medicines are reviewed at length with the patient today.  Concerns regarding medicines are outlined above.  Orders Placed This Encounter  Procedures   EKG 12-Lead   EKG 12-Lead   No orders of the defined types were placed in this encounter.   Patient Instructions  Medication Instructions:  Your physician recommends that you continue on your current medications as directed. Please refer to the Current Medication list given to you today.  *If you need a refill on your cardiac medications before your next appointment, please call your pharmacy*  Follow-Up: At [facility], you and your health needs are our priority.  As part of our continuing mission to provide you with exceptional heart care, we have created designated Provider  Care Teams.  These Care Teams include your primary Cardiologist (physician) and Advanced Practice Providers (APPs -  Physician Assistants and Nurse Practitioners) who all work together to provide you with the care you need, when you need it.   Your next appointment:   As scheduled   Signed, Cline Crock, PA-C  05/24/2023 10:39 AM    [facility]

## 2023-05-24 ENCOUNTER — Other Ambulatory Visit: Payer: Self-pay

## 2023-05-24 ENCOUNTER — Ambulatory Visit: Payer: Medicare Other | Admitting: Physician Assistant

## 2023-05-24 VITALS — BP 110/78 | HR 78 | Ht 73.0 in | Wt 261.2 lb

## 2023-05-24 DIAGNOSIS — Z950 Presence of cardiac pacemaker: Secondary | ICD-10-CM | POA: Diagnosis not present

## 2023-05-24 DIAGNOSIS — I5032 Chronic diastolic (congestive) heart failure: Secondary | ICD-10-CM | POA: Diagnosis not present

## 2023-05-24 DIAGNOSIS — J449 Chronic obstructive pulmonary disease, unspecified: Secondary | ICD-10-CM | POA: Diagnosis not present

## 2023-05-24 DIAGNOSIS — I4819 Other persistent atrial fibrillation: Secondary | ICD-10-CM | POA: Diagnosis not present

## 2023-05-24 DIAGNOSIS — I350 Nonrheumatic aortic (valve) stenosis: Secondary | ICD-10-CM

## 2023-05-24 DIAGNOSIS — I10 Essential (primary) hypertension: Secondary | ICD-10-CM | POA: Diagnosis not present

## 2023-05-24 NOTE — Patient Instructions (Signed)
Medication Instructions:  Your physician recommends that you continue on your current medications as directed. Please refer to the Current Medication list given to you today.  *If you need a refill on your cardiac medications before your next appointment, please call your pharmacy   Follow-Up: At [facility], you and your health needs are our priority.  As part of our continuing mission to provide you with exceptional heart care, we have created designated Provider Care Teams.  These Care Teams include your primary Cardiologist (physician) and Advanced Practice Providers (APPs -  Physician Assistants and Nurse Practitioners) who all work together to provide you with the care you need, when you need it.  Your next appointment:   As scheduled  

## 2023-05-24 NOTE — Progress Notes (Signed)
Pre Surgical Assessment: 5 M Walk Test  17M=16.75ft  5 Meter Walk Test- trial 1: 4.82 seconds 5 Meter Walk Test- trial 2: 4.93 seconds 5 Meter Walk Test- trial 3: 5.2 seconds 5 Meter Walk Test Average: 4.98 seconds

## 2023-05-26 ENCOUNTER — Other Ambulatory Visit: Payer: Self-pay | Admitting: Family Medicine

## 2023-05-26 DIAGNOSIS — N183 Chronic kidney disease, stage 3 unspecified: Secondary | ICD-10-CM

## 2023-05-26 DIAGNOSIS — E1100 Type 2 diabetes mellitus with hyperosmolarity without nonketotic hyperglycemic-hyperosmolar coma (NKHHC): Secondary | ICD-10-CM

## 2023-05-27 ENCOUNTER — Other Ambulatory Visit: Payer: Self-pay | Admitting: Family Medicine

## 2023-05-27 DIAGNOSIS — E1100 Type 2 diabetes mellitus with hyperosmolarity without nonketotic hyperglycemic-hyperosmolar coma (NKHHC): Secondary | ICD-10-CM

## 2023-05-27 DIAGNOSIS — I5032 Chronic diastolic (congestive) heart failure: Secondary | ICD-10-CM

## 2023-05-28 ENCOUNTER — Other Ambulatory Visit: Payer: Self-pay | Admitting: Family Medicine

## 2023-05-28 DIAGNOSIS — I480 Paroxysmal atrial fibrillation: Secondary | ICD-10-CM

## 2023-05-28 NOTE — Telephone Encounter (Signed)
Requested medications are due for refill today.  yes  Requested medications are on the active medications list.  yes  Last refill. 01/28/2023 #2 3 rf  Future visit scheduled.   no  Notes to clinic.  Pt last seen in office 12/07/2022    Requested Prescriptions  Pending Prescriptions Disp Refills   Continuous Glucose Sensor (FREESTYLE LIBRE 2 SENSOR) MISC [Pharmacy Med Name: FreeStyle Libre 2 Sensor kit]  3    Sig: APPLY 1 SENSOR ON THE SKIN EVERY 14 DAYS, USE AS DIRECTED TO CHECK BLOOD SUGAR     Endocrinology: Diabetes - Testing Supplies Failed - 05/26/2023  8:02 AM      Failed - Valid encounter within last 12 months    Recent Outpatient Visits           1 year ago Acute pain of left knee   [facility] Donita Brooks, MD   1 year ago Rib pain on left side   [facility] Medicine Pickard, Priscille Heidelberg, MD   1 year ago Rib pain on left side   [facility] Valentino Nose, NP   1 year ago Uncontrolled type 2 diabetes mellitus with hyperosmolarity without coma, without long-term current use of insulin (HCC)   [facility] Donita Brooks, MD   2 years ago Acute pulmonary edema (HCC)   [facility] Pickard, Priscille Heidelberg, MD

## 2023-05-29 NOTE — Telephone Encounter (Signed)
Requested Prescriptions  Pending Prescriptions Disp Refills   apixaban (ELIQUIS) 5 MG TABS tablet [Pharmacy Med Name: Eliquis 5 mg tablet] 180 tablet 0    Sig: TAKE 1 TABLET BY MOUTH TWICE DAILY     Hematology:  Anticoagulants - apixaban Failed - 05/28/2023  8:03 AM      Failed - Valid encounter within last 12 months    Recent Outpatient Visits           1 year ago Acute pain of left knee   [facility] Donita Brooks, MD   1 year ago Rib pain on left side   [facility] Pickard, Priscille Heidelberg, MD   1 year ago Rib pain on left side   [facility] Valentino Nose, NP   1 year ago Uncontrolled type 2 diabetes mellitus with hyperosmolarity without coma, without long-term current use of insulin (HCC)   [facility] Medicine Pickard, Priscille Heidelberg, MD   2 years ago Acute pulmonary edema (HCC)   [facility] Pickard, Priscille Heidelberg, MD              Passed - PLT in normal range and within 360 days    Platelets  Date Value Ref Range Status  05/15/2023 266 140 - 400 Thousand/uL Final         Passed - HGB in normal range and within 360 days    Hemoglobin  Date Value Ref Range Status  05/15/2023 16.5 13.2 - 17.1 g/dL Final         Passed - HCT in normal range and within 360 days    HCT  Date Value Ref Range Status  05/15/2023 47.9 38.5 - 50.0 % Final         Passed - Cr in normal range and within 360 days    Creat  Date Value Ref Range Status  05/15/2023 0.93 0.70 - 1.28 mg/dL Final   Creatinine, Urine  Date Value Ref Range Status  08/22/2022 37 20 - 320 mg/dL Final         Passed - AST in normal range and within 360 days    AST  Date Value Ref Range Status  05/15/2023 20 10 - 35 U/L Final         Passed - ALT in normal range and within 360 days    ALT  Date Value Ref Range Status  05/15/2023 14 9 - 46 U/L Final

## 2023-05-29 NOTE — Telephone Encounter (Signed)
Last OV 12/07/22 Requested Prescriptions  Pending Prescriptions Disp Refills   OZEMPIC, 2 MG/DOSE, 8 MG/3ML SOPN [Pharmacy Med Name: Ozempic 2 mg/dose (8 mg/3 mL) subcutaneous pen injector] 3 mL 2    Sig: INJECT 2 MG UNDER THE SKIN WEEKLY     Endocrinology:  Diabetes - GLP-1 Receptor Agonists - semaglutide Failed - 05/27/2023  8:05 AM      Failed - HBA1C in normal range and within 180 days    Hgb A1c MFr Bld  Date Value Ref Range Status  12/07/2022 8.4 (H) <5.7 % of total Hgb Final    Comment:    For someone without known diabetes, a hemoglobin A1c value of 6.5% or greater indicates that they may have  diabetes and this should be confirmed with a follow-up  test. . For someone with known diabetes, a value <7% indicates  that their diabetes is well controlled and a value  greater than or equal to 7% indicates suboptimal  control. A1c targets should be individualized based on  duration of diabetes, age, comorbid conditions, and  other considerations. . Currently, no consensus exists regarding use of hemoglobin A1c for diagnosis of diabetes for children. .          Failed - Valid encounter within last 6 months    Recent Outpatient Visits           1 year ago Acute pain of left knee   [facility] Pickard, Priscille Heidelberg, MD   1 year ago Rib pain on left side   [facility] Medicine Pickard, Priscille Heidelberg, MD   1 year ago Rib pain on left side   [facility] Medicine Valentino Nose, NP   1 year ago Uncontrolled type 2 diabetes mellitus with hyperosmolarity without coma, without long-term current use of insulin (HCC)   [facility] Medicine Donita Brooks, MD   2 years ago Acute pulmonary edema [facility])   [facility] Medicine Pickard, Priscille Heidelberg, MD              Passed - Cr in normal range and within 360 days    Creat  Date Value Ref Range Status  05/15/2023 0.93 0.70 - 1.28 mg/dL Final   Creatinine, Urine  Date Value Ref Range  Status  08/22/2022 37 20 - 320 mg/dL Final

## 2023-05-31 ENCOUNTER — Other Ambulatory Visit (HOSPITAL_COMMUNITY): Payer: Medicare Other

## 2023-06-03 DIAGNOSIS — J9601 Acute respiratory failure with hypoxia: Secondary | ICD-10-CM | POA: Diagnosis not present

## 2023-06-06 ENCOUNTER — Other Ambulatory Visit: Payer: Self-pay | Admitting: Family Medicine

## 2023-06-06 ENCOUNTER — Other Ambulatory Visit: Payer: Self-pay | Admitting: Physician Assistant

## 2023-06-06 MED ORDER — FUROSEMIDE 40 MG PO TABS: 80.0000 mg | ORAL_TABLET | Freq: Two times a day (BID) | ORAL | 3 refills | Status: DC

## 2023-06-06 MED ORDER — SPIRONOLACTONE 25 MG PO TABS: 25.0000 mg | ORAL_TABLET | Freq: Every day | ORAL | 3 refills | Status: DC

## 2023-06-06 NOTE — Telephone Encounter (Signed)
Requested Prescriptions  Pending Prescriptions Disp Refills   spironolactone (ALDACTONE) 25 MG tablet [Pharmacy Med Name: spironolactone 25 mg tablet] 90 tablet 0    Sig: TAKE 1 TABLET BY MOUTH DAILY     Cardiovascular: Diuretics - Aldosterone Antagonist Failed - 06/06/2023  7:51 AM      Failed - Valid encounter within last 6 months    Recent Outpatient Visits           1 year ago Acute pain of left knee   [facility] Donita Brooks, MD   1 year ago Rib pain on left side   [facility] Medicine Pickard, Priscille Heidelberg, MD   1 year ago Rib pain on left side   [facility] Medicine Valentino Nose, NP   1 year ago Uncontrolled type 2 diabetes mellitus with hyperosmolarity without coma, without long-term current use of insulin (HCC)   [facility] Medicine Pickard, Priscille Heidelberg, MD   2 years ago Acute pulmonary edema (HCC)   [facility] Pickard, Priscille Heidelberg, MD              Passed - Cr in normal range and within 180 days    Creat  Date Value Ref Range Status  05/15/2023 0.93 0.70 - 1.28 mg/dL Final   Creatinine, Urine  Date Value Ref Range Status  08/22/2022 37 20 - 320 mg/dL Final         Passed - K in normal range and within 180 days    Potassium  Date Value Ref Range Status  05/15/2023 4.0 3.5 - 5.3 mmol/L Final         Passed - Na in normal range and within 180 days    Sodium  Date Value Ref Range Status  05/15/2023 137 135 - 146 mmol/L Final         Passed - eGFR is 30 or above and within 180 days    GFR, Est African American  Date Value Ref Range Status  08/01/2020 101 > OR = 60 mL/min/1.23m2 Final   GFR, Est Non African American  Date Value Ref Range Status  08/01/2020 87 > OR = 60 mL/min/1.73m2 Final   GFR, Estimated  Date Value Ref Range Status  02/11/2023 >60 >60 mL/min Final    Comment:    (NOTE) Calculated using the CKD-EPI Creatinine Equation (2021)    eGFR  Date Value Ref Range Status   12/07/2022 88 > OR = 60 mL/min/1.68m2 Final         Passed - Last BP in normal range    BP Readings from Last 1 Encounters:  05/24/23 110/78

## 2023-06-18 ENCOUNTER — Ambulatory Visit: Payer: Medicare Other

## 2023-06-18 DIAGNOSIS — I4819 Other persistent atrial fibrillation: Secondary | ICD-10-CM

## 2023-06-18 LAB — CUP PACEART REMOTE DEVICE CHECK
Battery Remaining Longevity: 148 mo
Battery Voltage: 3.13 V
Brady Statistic AP VP Percent: 38.39 %
Brady Statistic AP VS Percent: 0.48 %
Brady Statistic AS VP Percent: 51.68 %
Brady Statistic AS VS Percent: 9.45 %
Brady Statistic RA Percent Paced: 38.99 %
Brady Statistic RV Percent Paced: 90.07 %
Date Time Interrogation Session: 20240924005057
Implantable Lead Connection Status: 753985
Implantable Lead Connection Status: 753985
Implantable Lead Implant Date: 20231222
Implantable Lead Implant Date: 20231222
Implantable Lead Location: 753859
Implantable Lead Location: 753860
Implantable Lead Model: 3830
Implantable Lead Model: 5076
Implantable Pulse Generator Implant Date: 20231222
Lead Channel Impedance Value: 399 Ohm
Lead Channel Impedance Value: 418 Ohm
Lead Channel Impedance Value: 456 Ohm
Lead Channel Impedance Value: 551 Ohm
Lead Channel Pacing Threshold Amplitude: 0.875 V
Lead Channel Pacing Threshold Amplitude: 0.875 V
Lead Channel Pacing Threshold Pulse Width: 0.4 ms
Lead Channel Pacing Threshold Pulse Width: 0.4 ms
Lead Channel Sensing Intrinsic Amplitude: 16.125 mV
Lead Channel Sensing Intrinsic Amplitude: 16.125 mV
Lead Channel Sensing Intrinsic Amplitude: 4.25 mV
Lead Channel Sensing Intrinsic Amplitude: 4.25 mV
Lead Channel Setting Pacing Amplitude: 1.75 V
Lead Channel Setting Pacing Amplitude: 2 V
Lead Channel Setting Pacing Pulse Width: 0.4 ms
Lead Channel Setting Sensing Sensitivity: 0.9 mV
Zone Setting Status: 755011

## 2023-06-19 ENCOUNTER — Other Ambulatory Visit: Payer: Medicare Other

## 2023-06-20 ENCOUNTER — Encounter: Payer: Self-pay | Admitting: Internal Medicine

## 2023-06-20 NOTE — Progress Notes (Signed)
PERIOPERATIVE PRESCRIPTION FOR IMPLANTED CARDIAC DEVICE PROGRAMMING  Patient Information: Name:  Steven Henry  DOB:  11-04-1950  MRN:  [id number]  Planned Procedure:  transcatheter aortic valve replacement, transfemoral approach  Surgeon:  Dr. Excell Seltzer and Dr. Renato Battles  Date of Procedure:  06/25/23  Cautery will be used.  Position during surgery:  supine   Device Information:  Clinic EP Physician:  Sherryl Manges, MD   Device Type:  Pacemaker Manufacturer and Phone #:  Medtronic: [phone] Pacemaker Dependent?:  No. Date of Last Device Check:  06/18/2023 Normal Device Function?:  Yes.    Electrophysiologist's Recommendations:  Have magnet available. Provide continuous ECG monitoring when magnet is used or reprogramming is to be performed.  Procedure will likely interfere with device function.  Device should be programmed:  Asynchronous pacing during procedure and returned to normal programming after procedure  Per Device Clinic Standing Orders, Wiliam Ke, RN  4:39 PM 06/20/2023

## 2023-06-21 ENCOUNTER — Other Ambulatory Visit: Payer: Self-pay

## 2023-06-21 ENCOUNTER — Ambulatory Visit: Payer: Medicare Other | Admitting: Nurse Practitioner

## 2023-06-21 ENCOUNTER — Telehealth: Payer: Self-pay | Admitting: Cardiology

## 2023-06-21 ENCOUNTER — Encounter (HOSPITAL_COMMUNITY)
Admission: RE | Admit: 2023-06-21 | Discharge: 2023-06-21 | Disposition: A | Discharge status: Home / Self Care | Payer: Medicare Other | Source: Ambulatory Visit | Attending: Cardiovascular Disease | Admitting: Cardiovascular Disease

## 2023-06-21 ENCOUNTER — Ambulatory Visit (HOSPITAL_COMMUNITY)
Admission: RE | Admit: 2023-06-21 | Discharge: 2023-06-21 | Disposition: A | Discharge status: Home / Self Care | Payer: Medicare Other | Source: Ambulatory Visit | Attending: Cardiovascular Disease | Admitting: Cardiovascular Disease

## 2023-06-21 DIAGNOSIS — Z1152 Encounter for screening for COVID-19: Secondary | ICD-10-CM | POA: Diagnosis not present

## 2023-06-21 DIAGNOSIS — Z01818 Encounter for other preprocedural examination: Secondary | ICD-10-CM | POA: Diagnosis not present

## 2023-06-21 DIAGNOSIS — I350 Nonrheumatic aortic (valve) stenosis: Secondary | ICD-10-CM | POA: Diagnosis not present

## 2023-06-21 DIAGNOSIS — Z950 Presence of cardiac pacemaker: Secondary | ICD-10-CM | POA: Diagnosis not present

## 2023-06-21 DIAGNOSIS — R918 Other nonspecific abnormal finding of lung field: Secondary | ICD-10-CM | POA: Diagnosis not present

## 2023-06-21 LAB — URINALYSIS, ROUTINE W REFLEX MICROSCOPIC
Bacteria, UA: NONE SEEN
Bilirubin Urine: NEGATIVE
Glucose, UA: >=500 mg/dL — AB
Hgb urine dipstick: NEGATIVE
Ketones, ur: NEGATIVE mg/dL
Leukocytes,Ua: NEGATIVE
Nitrite: NEGATIVE
Protein, ur: NEGATIVE mg/dL
Specific Gravity, Urine: 1.015 (ref 1.005–1.030)
pH: 5 (ref 5.0–8.0)

## 2023-06-21 LAB — CBC
HCT: 50.1 % (ref 39.0–52.0)
Hemoglobin: 17.2 g/dL — ABNORMAL HIGH (ref 13.0–17.0)
MCH: 33.1 pg (ref 26.0–34.0)
MCHC: 34.3 g/dL (ref 30.0–36.0)
MCV: 96.5 fL (ref 80.0–100.0)
Platelets: 260 10*3/uL (ref 150–400)
RBC: 5.19 MIL/uL (ref 4.22–5.81)
RDW: 13 % (ref 11.5–15.5)
WBC: 10.9 10*3/uL — ABNORMAL HIGH (ref 4.0–10.5)
nRBC: 0 % (ref 0.0–0.2)

## 2023-06-21 LAB — COMPREHENSIVE METABOLIC PANEL
ALT: 17 U/L (ref 0–44)
AST: 22 U/L (ref 15–41)
Albumin: 4 g/dL (ref 3.5–5.0)
Alkaline Phosphatase: 55 U/L (ref 38–126)
Anion gap: 14 (ref 5–15)
BUN: 14 mg/dL (ref 8–23)
CO2: 28 mmol/L (ref 22–32)
Calcium: 9.1 mg/dL (ref 8.9–10.3)
Chloride: 95 mmol/L — ABNORMAL LOW (ref 98–111)
Creatinine, Ser: 1.08 mg/dL (ref 0.61–1.24)
GFR, Estimated: >60 mL/min (ref >60)
Glucose, Bld: 122 mg/dL — ABNORMAL HIGH (ref 70–99)
Potassium: 3.2 mmol/L — ABNORMAL LOW (ref 3.5–5.1)
Sodium: 137 mmol/L (ref 135–145)
Total Bilirubin: 1 mg/dL (ref 0.3–1.2)
Total Protein: 7.7 g/dL (ref 6.5–8.1)

## 2023-06-21 LAB — PROTIME-INR
INR: 1.1 (ref 0.8–1.2)
Prothrombin Time: 14.6 s (ref 11.4–15.2)

## 2023-06-21 LAB — SURGICAL PCR SCREEN
MRSA, PCR: NEGATIVE
Staphylococcus aureus: NEGATIVE

## 2023-06-21 LAB — TYPE AND SCREEN
ABO/RH(D): O NEG
Antibody Screen: NEGATIVE

## 2023-06-21 LAB — SARS CORONAVIRUS 2 (TAT 6-24 HRS): SARS Coronavirus 2: NEGATIVE

## 2023-06-21 NOTE — Telephone Encounter (Signed)
PAT labs reviewed today. K+ noted to be low at 3.2. Called patient with no answer. LVM and will attempt later today. Plan to increase home KCL to 2 tablets ( ) x 3 days then resume thereafter. Plan to repeat labs while hospitalized Tuesday.   Georgie Chard NP-C Structural Heart Team  Pager: [phone] Phone: [phone]

## 2023-06-21 NOTE — Progress Notes (Signed)
Medtronic rep notified to be here on day of surgery 06/25/23.

## 2023-06-21 NOTE — Telephone Encounter (Signed)
ADDEND: Patient returned call. Confirmed he will increase KCL to today, Saturday, and Sunday then resume on Monday prior to TAVR scheduled for 06/25/23.   Georgie Chard NP-C Structural Heart Team  Pager: [phone] Phone: [phone]

## 2023-06-21 NOTE — Progress Notes (Signed)
Patient signed all consents at PAT lab appointment. CHG soap and instructions were given to patient. CHG surgical prep reviewed with patient and all questions answered.  Patients chart send to anesthesia for review.  

## 2023-06-24 MED ORDER — DEXMEDETOMIDINE HCL IN NACL 400 MCG/100ML IV SOLN
0.1000 ug/kg/h | INTRAVENOUS | Status: DC
  Filled 2023-06-24: qty 100

## 2023-06-24 MED ORDER — NOREPINEPHRINE 4 MG/250ML-% IV SOLN
0.0000 ug/min | INTRAVENOUS | Status: AC
  Administered 2023-06-25: 2 ug/min via INTRAVENOUS
  Filled 2023-06-24: qty 250

## 2023-06-24 MED ORDER — CEFAZOLIN SODIUM-DEXTROSE 2-4 GM/100ML-% IV SOLN
2.0000 g | INTRAVENOUS | Status: AC
  Administered 2023-06-25: 2 g via INTRAVENOUS
  Filled 2023-06-24: qty 100

## 2023-06-24 MED ORDER — HEPARIN 30,000 UNITS/1000 ML (OHS) CELLSAVER SOLUTION
Status: DC
  Filled 2023-06-24 (×2): qty 1000

## 2023-06-24 MED ORDER — MAGNESIUM SULFATE 50 % IJ SOLN
40.0000 meq | INTRAMUSCULAR | Status: DC
  Filled 2023-06-24 (×2): qty 9.85

## 2023-06-24 MED ORDER — POTASSIUM CHLORIDE 2 MEQ/ML IV SOLN
80.0000 meq | INTRAVENOUS | Status: DC
  Filled 2023-06-24 (×2): qty 40

## 2023-06-24 NOTE — H&P (Signed)
[address]       [name] [address]             [phone]      Cardiothoracic Surgery Admission History and Physical   PCP is Donita Brooks, MD Referring Provider is [name], MD Primary Cardiologist is Marjo Bicker, MD   Reason for admission: Severe aortic stenosis   HPI:   The patient is a 72 year old gentleman with a history of type 2 diabetes, hypertension, active smoking and COPD on home oxygen, OSA not on CPAP, stage III chronic kidney disease, atrial fibrillation on Eliquis with DCCV in February and April 2024, status post permanent pacemaker placement in 08/2022, HFpEF and aortic stenosis who was referred for consideration of TAVR.  He has had 3 admissions over the past year for congestive heart failure and shortness of breath that resolved with diuresis.  Since his cardioversion in April 2024 he said that he is felt fairly well.  His most recent echocardiogram on 10/26/2022 showed a moderately calcified and thickened aortic valve with restricted leaflet mobility.  The mean gradient was 34.5 mmHg with a peak gradient of 68.9 mmHg.  Valve area by VTI was measured at 1.27 cm.  Left ventricular ejection fraction was 55 to 60% with mild right ventricular enlargement and mild decreased function.   He is divorced and lives in [place].  He said that he continues to smoke but has cut it down to about 6 cigarettes/day.  He used to smoke 3 packs/day but started cutting it down a few years ago.  He denies any chest pain or pressure.  He continues to have some exertional fatigue but denies any significant shortness of breath.  He has had some lower extremity edema.  He denies any dizziness or syncope.       Past Medical History:  Diagnosis Date   A-fib (HCC)     CHF (congestive heart failure) (HCC)     CKD (chronic kidney disease), stage III (HCC)     Colon polyps     Diabetes mellitus type II, uncontrolled     Diabetes mellitus  without complication (HCC)     Hypertension     Morbid obesity (HCC)     PVD (peripheral vascular disease) (HCC)      right femoral artery   Sleep apnea      not on CPAP    Sleep apnea                 Past Surgical History:  Procedure Laterality Date   CARDIOVERSION N/A 11/05/2022    Procedure: CARDIOVERSION;  Surgeon: Maisie Fus, MD;  Location: [facility] ENDOSCOPY;  Service: Cardiovascular;  Laterality: N/A;   CARDIOVERSION N/A 01/09/2023    Procedure: CARDIOVERSION;  Surgeon: Jake Bathe, MD;  Location: [facility] INVASIVE CV LAB;  Service: Cardiovascular;  Laterality: N/A;   COLONOSCOPY N/A 09/16/2015    Procedure: COLONOSCOPY;  Surgeon: Corbin Ade, MD;  Location: [facility] ENDO SUITE;  Service: Endoscopy;  Laterality: N/A;  8:15 AM - pt has c pap   ENDOVENOUS ABLATION SAPHENOUS VEIN W/ LASER Right 08/26/2017    endovenous laser ablation right greater saphenous vein by Josephina Gip MD    PACEMAKER IMPLANT N/A 09/14/2022    Procedure: PACEMAKER IMPLANT;  Surgeon: Duke Salvia, MD;  Location: [facility] INVASIVE CV LAB;  Service: Cardiovascular;  Laterality: N/A;   RIGHT/LEFT HEART CATH AND CORONARY ANGIOGRAPHY N/A 02/11/2023  Procedure: RIGHT/LEFT HEART CATH AND CORONARY ANGIOGRAPHY;  Surgeon: [name], MD;  Location: [facility] INVASIVE CV LAB;  Service: Cardiovascular;  Laterality: N/A;   stab phlebectomy  Right 01/07/2018    stab phlebectomy 10-20 incisions right leg by Josephina Gip MD                Family History  Problem Relation Age of Onset   Colon cancer Mother     Diabetes Mother     Heart disease Mother     Diabetes Sister     Diabetes Sister     Diabetes Sister            Social History         Socioeconomic History   Marital status: Divorced      Spouse name: Not on file   Number of children: 2   Years of education: Not on file   Highest education level: Not on file  Occupational History   Occupation: Self Employed  Tobacco Use   Smoking status: Some Days       Packs/day: 1.50      Years: 30.00      Additional pack years: 0.00      Total pack years: 45.00      Types: Cigarettes      Start date: 04/04/1958   Smokeless tobacco: Never   Tobacco comments:      Quit smoking day of DCCV 11/07/22      Started back due to stress - 1/2 pack per day - trying to cut back down. - 12/17/2022  Vaping Use   Vaping Use: Never used  Substance and Sexual Activity   Alcohol use: Yes      Alcohol/week: 1.0 standard drink of alcohol      Types: 1 Cans of beer per week      Comment: 1 beer once a month 10/12/22   Drug use: No   Sexual activity: Not on file      Comment: divorced, 2 daughters.  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  Other Topics Concern   Not on file  Social History Narrative    Pt states he usually smokes after eating or when stressed    Social Determinants of Health        Financial Resource Strain: Low Risk  (11/16/2022)    Overall Financial Resource Strain (CARDIA)     Difficulty of Paying Living Expenses: Not very hard  Food Insecurity: No Food Insecurity (11/16/2022)    Hunger Vital Sign     Worried About Running Out of Food in the Last Year: Never true     Ran Out of Food in the Last Year: Never true  Transportation Needs: No Transportation Needs (10/26/2022)    PRAPARE - Therapist, art (Medical): No     Lack of Transportation (Non-Medical): No  Physical Activity: Sufficiently Active (09/06/2022)    Exercise Vital Sign     Days of Exercise per Week: 5 days     Minutes of Exercise per Session: 30 min  Stress: No Stress Concern Present (09/06/2022)    [employer] of Occupational Health - Occupational Stress Questionnaire     Feeling of Stress : Not at all  Social Connections: Socially Isolated (09/06/2022)    Social Connection and Isolation Panel [NHANES]     Frequency of Communication with Friends and Family: More than three times a week     Frequency of Social Gatherings with Friends  and  Family: Three times a week     Attends Religious Services: Never     Active Member of Clubs or Organizations: No     Attends Banker Meetings: Never     Marital Status: Divorced  Catering manager Violence: Not At Risk (10/26/2022)    Humiliation, Afraid, Rape, and Kick questionnaire     Fear of Current or Ex-Partner: No     Emotionally Abused: No     Physically Abused: No     Sexually Abused: No             Prior to Admission medications   Medication Sig Start Date End Date Taking? Authorizing Provider  Accu-Chek FastClix Lancets MISC Check BS QAM - DX: E11.9 02/12/19   Yes Donita Brooks, MD  albuterol (VENTOLIN HFA) 108 (90 Base) MCG/ACT inhaler Inhale 2 puffs into the lungs every 6 (six) hours as needed for wheezing or shortness of breath. 11/19/22   Yes Pickard, Priscille Heidelberg, MD  atorvastatin (LIPITOR) 40 MG tablet TAKE 1 TABLET BY MOUTH DAILY Patient taking differently: Take 40 mg by mouth at bedtime. 08/13/22   Yes Donita Brooks, MD  bisoprolol (ZEBETA) 5 MG tablet Take 1 tablet (5 mg total) by mouth daily. Patient taking differently: Take 5 mg by mouth at bedtime. 09/14/22   Yes Duke, Roe Rutherford, PA  Blood Glucose Monitoring Suppl (ONE TOUCH ULTRA 2) w/Device KIT USETO CHECK BLOOD SUGAR EVERY MORNING 05/26/21   Yes Donita Brooks, MD  budesonide-formoterol [facility]) 160-4.5 MCG/ACT inhaler Inhale 2 puffs into the lungs in the morning and at bedtime. Patient taking differently: Inhale 2 puffs into the lungs daily. 10/29/22   Yes Vassie Loll, MD  Continuous Blood Gluc Receiver (FREESTYLE LIBRE 2 READER) DEVI For continuous blood glucose monitoring 06/14/22   Yes Donita Brooks, MD  Continuous Glucose Sensor (FREESTYLE LIBRE 2 SENSOR) MISC APPLY 1 SENSOR ON THE SKIN EVERY 14 DAYS, USE AS DIRECTED TO CHECK BLOOD SUGAR 01/28/23   Yes Donita Brooks, MD  ELIQUIS 5 MG TABS tablet TAKE 1 TABLET BY MOUTH TWICE DAILY 01/29/23   Yes Donita Brooks, MD  empagliflozin  (JARDIANCE) 25 MG TABS tablet TAKE 1 TABLET BY MOUTH DAILY BEFORE breakfast 12/24/22   Yes Donita Brooks, MD  flecainide (TAMBOCOR) 100 MG tablet Take 1 tablet (100 mg total) by mouth 2 (two) times daily. 12/24/22   Yes Duke Salvia, MD  furosemide (LASIX) 40 MG tablet Take 2 tablets (80 mg total) by mouth 2 (two) times daily. 10/29/22   Yes Vassie Loll, MD  glipiZIDE (GLUCOTROL) 10 MG tablet Take 10 mg by mouth every morning. 02/07/23   Yes [provider]  insulin glargine (LANTUS) 100 UNIT/ML injection Inject 0.25 mLs (25 Units total) into the skin daily. PLEASE INCREASE INSULIN BY 1 UNIT DAILY UNTIL FBG IS <130, THEN HOLD AT THAT DOSE 08/23/22   Yes Donita Brooks, MD  ipratropium-albuterol (DUONEB) 0.5-2.5 (3) MG/3ML SOLN Take 3 mLs by nebulization 3 (three) times daily. Patient taking differently: Take 3 mLs by nebulization every 4 (four) hours as needed (shortness of breath). 03/01/22   Yes Johnson, Clanford L, MD  Lancets Misc. (ACCU-CHEK FASTCLIX LANCET) KIT Check BS QAM - DX: E11.9 02/12/19   Yes Donita Brooks, MD  losartan (COZAAR) 50 MG tablet Take 1 tablet (50 mg total) by mouth daily. 03/05/23   Yes Donita Brooks, MD  metFORMIN (GLUCOPHAGE) 1000 MG tablet TAKE  1 TABLET BY MOUTH TWICE DAILY WITH a meal 08/13/22   Yes PickardPriscille Heidelberg, MD  Multiple Vitamin (MULTIVITAMIN) capsule Take 1 capsule by mouth in the morning. Mens one a day     Yes [provider]  ONETOUCH ULTRA test strip USE TO CHECK BLOOD SUGAR EVERY MORNING 07/27/22   Yes Donita Brooks, MD  potassium chloride SA (KLOR-CON M20) 20 MEQ tablet Take 1 tablet by mouth twice a day for 3 days, then take 1 tablet by mouth daily. 02/08/23   Yes Georgie Chard D, NP  Semaglutide, 2 MG/DOSE, 8 MG/3ML SOPN Inject 2 mg as directed once a week. 12/20/22   Yes Donita Brooks, MD  spironolactone (ALDACTONE) 25 MG tablet TAKE 1 TABLET BY MOUTH DAILY 02/20/23   Yes Donita Brooks, MD  Zinc 30 MG CAPS Take 30  mg by mouth daily.     Yes [provider]            Current Outpatient Medications  Medication Sig Dispense Refill   Accu-Chek FastClix Lancets MISC Check BS QAM - DX: E11.9 100 each 3   albuterol (VENTOLIN HFA) 108 (90 Base) MCG/ACT inhaler Inhale 2 puffs into the lungs every 6 (six) hours as needed for wheezing or shortness of breath. 8 g 0   atorvastatin (LIPITOR) 40 MG tablet TAKE 1 TABLET BY MOUTH DAILY (Patient taking differently: Take 40 mg by mouth at bedtime.) 90 tablet 3   bisoprolol (ZEBETA) 5 MG tablet Take 1 tablet (5 mg total) by mouth daily. (Patient taking differently: Take 5 mg by mouth at bedtime.) 90 tablet 3   Blood Glucose Monitoring Suppl (ONE TOUCH ULTRA 2) w/Device KIT USETO CHECK BLOOD SUGAR EVERY MORNING 1 kit 0   budesonide-formoterol (SYMBICORT) 160-4.5 MCG/ACT inhaler Inhale 2 puffs into the lungs in the morning and at bedtime. (Patient taking differently: Inhale 2 puffs into the lungs daily.) 1 each 12   Continuous Blood Gluc Receiver (FREESTYLE LIBRE 2 READER) DEVI For continuous blood glucose monitoring 1 each 1   Continuous Glucose Sensor (FREESTYLE LIBRE 2 SENSOR) MISC APPLY 1 SENSOR ON THE SKIN EVERY 14 DAYS, USE AS DIRECTED TO CHECK BLOOD SUGAR 2 each 3   ELIQUIS 5 MG TABS tablet TAKE 1 TABLET BY MOUTH TWICE DAILY 60 tablet 3   empagliflozin (JARDIANCE) 25 MG TABS tablet TAKE 1 TABLET BY MOUTH DAILY BEFORE breakfast 90 tablet 0   flecainide (TAMBOCOR) 100 MG tablet Take 1 tablet (100 mg total) by mouth 2 (two) times daily. 180 tablet 3   furosemide (LASIX) 40 MG tablet Take 2 tablets (80 mg total) by mouth 2 (two) times daily. 180 tablet 1   glipiZIDE (GLUCOTROL) 10 MG tablet Take 10 mg by mouth every morning.       insulin glargine (LANTUS) 100 UNIT/ML injection Inject 0.25 mLs (25 Units total) into the skin daily. PLEASE INCREASE INSULIN BY 1 UNIT DAILY UNTIL FBG IS <130, THEN HOLD AT THAT DOSE 30 mL 5   ipratropium-albuterol (DUONEB) 0.5-2.5 (3)  MG/3ML SOLN Take 3 mLs by nebulization 3 (three) times daily. (Patient taking differently: Take 3 mLs by nebulization every 4 (four) hours as needed (shortness of breath).) 360 mL 1   Lancets Misc. (ACCU-CHEK FASTCLIX LANCET) KIT Check BS QAM - DX: E11.9 1 kit 0   losartan (COZAAR) 50 MG tablet Take 1 tablet (50 mg total) by mouth daily. 90 tablet 3   metFORMIN (GLUCOPHAGE) 1000 MG tablet TAKE 1 TABLET  BY MOUTH TWICE DAILY WITH a meal 180 tablet 3   Multiple Vitamin (MULTIVITAMIN) capsule Take 1 capsule by mouth in the morning. Mens one a day       ONETOUCH ULTRA test strip USE TO CHECK BLOOD SUGAR EVERY MORNING 100 strip 12   potassium chloride SA (KLOR-CON M20) 20 MEQ tablet Take 1 tablet by mouth twice a day for 3 days, then take 1 tablet by mouth daily. 30 tablet 1   Semaglutide, 2 MG/DOSE, 8 MG/3ML SOPN Inject 2 mg as directed once a week. 3 mL 3   spironolactone (ALDACTONE) 25 MG tablet TAKE 1 TABLET BY MOUTH DAILY 90 tablet 0   Zinc 30 MG CAPS Take 30 mg by mouth daily.          No current facility-administered medications for this visit.        Allergies  No Known Allergies         Review of Systems:               General:                      normal appetite, + decreased energy, no weight gain, no weight loss, no fever             Cardiac:                       no chest pain with exertion, no chest pain at rest, no SOB with  exertion, no resting SOB, no PND, no orthopnea, no palpitations, + arrhythmia, + atrial fibrillation, + LE edema, no dizzy spells, no syncope             Respiratory:                 no shortness of breath, + home oxygen, no productive cough, no dry cough, no bronchitis, no wheezing, no hemoptysis, no asthma, no pain with inspiration or cough, + sleep apnea, no CPAP at night             GI:                               no difficulty swallowing, no reflux, no frequent heartburn, no hiatal hernia, no abdominal pain, no constipation, no diarrhea, no  hematochezia, no hematemesis, no melena             GU:                              no dysuria,  no frequency, no urinary tract infection, no hematuria, no enlarged prostate, no kidney stones, + chronic kidney disease             Vascular:                     no pain suggestive of claudication, + pain in feet, no leg cramps, no varicose veins, no DVT, no non-healing foot ulcer             Neuro:                         no stroke, no TIA's, no seizures, no headaches, no temporary blindness one eye,  no slurred speech, no peripheral neuropathy, no chronic pain, no instability of gait, no memory/cognitive  dysfunction             Musculoskeletal:         no arthritis, no joint swelling, no myalgias, no difficulty walking, normal mobility              Skin:                            no rash, no itching, no skin infections, no pressure sores or ulcerations             Psych:                         no anxiety, no depression, no nervousness, no unusual recent stress             Eyes:                           no blurry vision, no floaters, no recent vision changes, no  glasses or contacts             ENT:                            no hearing loss, + loose or painful teeth, no dentures, last saw dentist years ago             Hematologic:               no easy bruising, no abnormal bleeding, no clotting disorder, no frequent epistaxis             Endocrine:                   + diabetes, does check CBG's at home                            Physical Exam:               BP 108/69   Pulse 81   Resp 18   Ht 6\' 1"  (1.854 m)   Wt 247 lb (112 kg)   SpO2 95% Comment: RA  BMI 32.59 kg/m              General:                      Obese,  well-appearing             HEENT:                       Unremarkable, NCAT, PERLA, EOMI             Neck:                           no JVD, no bruits, no adenopathy              Chest:                          clear to auscultation, symmetrical breath sounds, no wheezes, no  rhonchi              CV:  RRR, 3/6 systolic murmur RSB, no diastolic murmur             Abdomen:                    soft, non-tender, no masses              Extremities:                 warm, well-perfused, pedal pulses not palpable, mild lower extremity edema             Rectal/GU                   Deferred             Neuro:                         Grossly non-focal and symmetrical throughout             Skin:                            Clean and dry, no rashes, no breakdown   Diagnostic Tests:   ECHOCARDIOGRAM REPORT       Patient Name:   Steven Henry Date of Exam: 10/26/2022  Medical Rec #:  [id number]           Height:       73.0 in  Accession #:    [id number]          Weight:       286.6 lb  Date of Birth:  11-Aug-1951           BSA:          2.507 m  Patient Age:    71 years            BP:           104/65 mmHg  Patient Gender: M                   HR:           77 bpm.  Exam Location:  [facility]   Procedure: 2D Echo, Cardiac Doppler, Color Doppler and Intracardiac             Opacification Agent   Indications:    Congestive Heart Failure I50.9    History:        Patient has prior history of Echocardiogram examinations,  most                 recent 02/26/2022. Pacemaker, Arrythmias:Atrial  Fibrillation,                 Signs/Symptoms:Dyspnea; Risk Factors:Sleep Apnea, Current                  Smoker, Diabetes, Hypertension and Dyslipidemia.    Sonographer:    Aron Baba  Referring Phys: [id number] ASIA B ZIERLE-GHOSH     Sonographer Comments: Technically difficult study due to poor echo  windows. Image acquisition challenging due to patient body habitus, Image  acquisition challenging due to respiratory motion and Image acquisition  challenging due to COPD.  IMPRESSIONS     1. Left ventricular ejection fraction, by estimation, is 55 to 60%. The  left ventricle has normal function. The left ventricle has no regional  wall motion  abnormalities. Left ventricular diastolic function could not  be evaluated.  2. Right ventricular systolic function is mildly reduced. The right  ventricular size is mildly enlarged. There is mildly elevated pulmonary  artery systolic pressure. The estimated right ventricular systolic  pressure is 43.5 mmHg.   3. Left atrial size was severely dilated.   4. Right atrial size was moderately dilated.   5. The mitral valve is grossly normal. No evidence of mitral valve  regurgitation. No evidence of mitral stenosis.   6. The aortic valve is calcified. There is moderate calcification of the  aortic valve. Aortic valve regurgitation is not visualized. Moderate to  severe aortic valve stenosis. Aortic valve area, by VTI measures 1.27 cm.  Aortic valve mean gradient  measures 34.5 mmHg. Aortic valve Vmax measures 4.15 m/s.   7. Aortic dilatation noted. There is borderline dilatation of the aortic  root, measuring 39 mm. There is mild dilatation of the ascending aorta,  measuring 40 mm.   8. The inferior vena cava is dilated in size with <50% respiratory  variability, suggesting right atrial pressure of 15 mmHg.   Comparison(s): No significant change from prior study.   FINDINGS   Left Ventricle: Left ventricular ejection fraction, by estimation, is 55  to 60%. The left ventricle has normal function. The left ventricle has no  regional wall motion abnormalities. Definity contrast agent was given IV  to delineate the left ventricular   endocardial borders. The left ventricular internal cavity size was normal  in size. There is no left ventricular hypertrophy. Left ventricular  diastolic function could not be evaluated due to atrial fibrillation. Left  ventricular diastolic function could   not be evaluated.   Right Ventricle: The right ventricular size is mildly enlarged. No  increase in right ventricular wall thickness. Right ventricular systolic  function is mildly reduced. There is  mildly elevated pulmonary artery  systolic pressure. The tricuspid regurgitant   velocity is 2.67 m/s, and with an assumed right atrial pressure of 15  mmHg, the estimated right ventricular systolic pressure is 43.5 mmHg.   Left Atrium: Left atrial size was severely dilated.   Right Atrium: Right atrial size was moderately dilated.   Pericardium: There is no evidence of pericardial effusion.   Mitral Valve: The mitral valve is grossly normal. No evidence of mitral  valve regurgitation. No evidence of mitral valve stenosis.   Tricuspid Valve: The tricuspid valve is not well visualized. Tricuspid  valve regurgitation is not demonstrated. No evidence of tricuspid  stenosis.   Aortic Valve: The aortic valve is calcified. There is moderate  calcification of the aortic valve. Aortic valve regurgitation is not  visualized. Moderate to severe aortic stenosis is present. Aortic valve  mean gradient measures 34.5 mmHg. Aortic valve  peak gradient measures 68.9 mmHg. Aortic valve area, by VTI measures 1.27  cm.   Pulmonic Valve: The pulmonic valve was not well visualized. Pulmonic valve  regurgitation is not visualized. No evidence of pulmonic stenosis.   Aorta: Aortic dilatation noted. There is borderline dilatation of the  aortic root, measuring 39 mm. There is mild dilatation of the ascending  aorta, measuring 40 mm.   Venous: The inferior vena cava is dilated in size with less than 50%  respiratory variability, suggesting right atrial pressure of 15 mmHg.   IAS/Shunts: No atrial level shunt detected by color flow Doppler.     LEFT VENTRICLE  PLAX 2D  LVIDd:         5.30 cm   Diastology  LVIDs:  4.10 cm   LV e' medial:    7.30 cm/s  LV PW:         1.20 cm   LV E/e' medial:  14.2  LV IVS:        1.00 cm   LV e' lateral:   11.00 cm/s  LVOT diam:     2.50 cm   LV E/e' lateral: 9.5  LV SV:         96  LV SV Index:   38  LVOT Area:     4.91 cm     RIGHT VENTRICLE  RV S  prime:     9.48 cm/s  TAPSE (M-mode): 1.5 cm   LEFT ATRIUM              Index        RIGHT ATRIUM           Index  LA diam:        5.90 cm  2.35 cm/m   RA Area:     29.50 cm  LA Vol (A2C):   147.0 ml 58.63 ml/m  RA Volume:   106.00 ml 42.28 ml/m  LA Vol (A4C):   138.0 ml 55.04 ml/m  LA Biplane Vol: 154.0 ml 61.43 ml/m   AORTIC VALVE  AV Area (Vmax):    1.41 cm  AV Area (Vmean):   1.35 cm  AV Area (VTI):     1.27 cm  AV Vmax:           415.00 cm/s  AV Vmean:          276.000 cm/s  AV VTI:            0.758 m  AV Peak Grad:      68.9 mmHg  AV Mean Grad:      34.5 mmHg  LVOT Vmax:         119.00 cm/s  LVOT Vmean:        75.800 cm/s  LVOT VTI:          0.196 m  LVOT/AV VTI ratio: 0.26    AORTA  Ao Root diam: 3.90 cm  Ao Asc diam:  4.00 cm   MITRAL VALVE                TRICUSPID VALVE  MV Area (PHT): 2.90 cm     TR Peak grad:   28.5 mmHg  MV Decel Time: 262 msec     TR Vmax:        267.00 cm/s  MR Peak grad: 98.0 mmHg  MR Vmax:      495.00 cm/s   SHUNTS  MV E velocity: 104.00 cm/s  Systemic VTI:  0.20 m                              Systemic Diam: 2.50 cm   Vishnu Priya Mallipeddi  Electronically signed by Winfield Rast Mallipeddi  Signature Date/Time: 10/26/2022/1:40:59 PM        Final        Physicians   Panel Physicians Referring Physician Case Authorizing Physician  [name], MD (Primary)        Procedures   RIGHT/LEFT HEART CATH AND CORONARY ANGIOGRAPHY    Conclusion   1.  Patent coronary arteries, codominant coronary anatomy, with no significant stenosis 2.  Essentially normal right and left heart diastolic filling pressures and preserved cardiac output 3.  Moderately severe  aortic stenosis with mean gradient 27 mmHg and calculated valve area 1.16 cm, indexed valve area 0.47   Recommendations: Continue evaluation for TAVR in this symptomatic patient   Surgeon Notes       01/09/2023 10:19 AM CV Procedure signed by Jake Bathe, MD     Indications   Severe aortic stenosis [I35.0 (ICD-10-CM)]    Procedural Details   Technical Details INDICATION: Severe aortic stenosis - Pre-TAVR evaluation  PROCEDURAL DETAILS: The right antecubital fossa is prepped, draped, and anesthetized with 1% lidocaine.  Using direct ultrasound guidance, the right brachial vein is accessed via a front wall puncture.  A 4/5 French slender sheath is inserted.  The right wrist was then prepped, draped, and anesthetized with 1% lidocaine. Using vascular ultrasound guidance a 5/6 French Slender sheath was placed in the right radial artery. Intra-arterial verapamil was administered through the radial artery sheath. IV heparin was administered after a JR4 catheter was advanced into the central aorta. A Swan-Ganz catheter was used for the right heart catheterization. Standard protocol was followed for recording of right heart pressures and sampling of oxygen saturations. Fick cardiac output was calculated.  Caution was used to avoid pacemaker leads.  Standard Judkins catheters were used for selective coronary angiography. LV pressure is recorded and an aortic valve pullback is performed. There were no immediate procedural complications. The patient was transferred to the post catheterization recovery area for further monitoring.      Estimated blood loss <50 mL.   During this procedure medications were administered to achieve and maintain moderate conscious sedation while the patient's heart rate, blood pressure, and oxygen saturation were continuously monitored and I was present face-to-face 100% of this time.    Medications (Filter: Administrations occurring from [phone] to 0937 on 02/11/23)  important  Continuous medications are totaled by the amount administered until 02/11/23 0937.    fentaNYL (SUBLIMAZE) injection (mcg)  Total dose: 25 mcg Date/Time Rate/Dose/Volume Action    02/11/23 0852 25 mcg Given    midazolam (VERSED) injection (mg)  Total dose: 2  mg Date/Time Rate/Dose/Volume Action    02/11/23 0852 2 mg Given    Heparin (Porcine) in NaCl 1000-0.9 UT/500ML-% SOLN (mL)  Total volume: 1,000 mL Date/Time Rate/Dose/Volume Action    02/11/23 0854 500 mL Given    0854 500 mL Given    lidocaine (PF) (XYLOCAINE) 1 % injection (mL)  Total volume: 10 mL Date/Time Rate/Dose/Volume Action    02/11/23 0904 10 mL Given    Radial Cocktail/Verapamil only (mL)  Total volume: 10 mL Date/Time Rate/Dose/Volume Action    02/11/23 0908 10 mL Given    heparin sodium (porcine) injection (Units)  Total dose: 5,000 Units Date/Time Rate/Dose/Volume Action    02/11/23 0914 5,000 Units Given    iohexol (OMNIPAQUE) 350 MG/ML injection (mL)  Total volume: 45 mL Date/Time Rate/Dose/Volume Action    02/11/23 0933 45 mL Given      Sedation Time   Sedation Time Physician-1: 36 minutes 4 seconds Contrast        Administrations occurring from 0843 to 0937 on 02/11/23:  Medication Name Total Dose  iohexol (OMNIPAQUE) 350 MG/ML injection 45 mL    Radiation/Fluoro   Fluoro time: 6.3 (min) DAP: 35.8 (Gycm2) Cumulative Air Kerma: 530.3 (mGy) Complications   Complications documented before study signed (02/11/2023  9:42 AM)    No complications were associated with this study.  Documented by Lidia Collum, RCIS - 02/11/2023  8:52 AM  Coronary Findings   Diagnostic Dominance: Co-dominant Left Main  The vessel exhibits minimal luminal irregularities. Left main is patent with no significant stenosis. It divides into the LAD and left circumflex.    Left Anterior Descending  The LAD is patent to the apex with minimal irregularity. The diagonal branches are patent with no stenosis.    Left Circumflex  Left circumflex is patent. It is a codominant vessel and supplies 3 OM branches and a left PDA. There is mild irregularity but no significant stenosis present.    Right Coronary Artery  There is mild diffuse disease throughout the vessel.  Patent vessel with mild diffuse plaquing in the mid and distal portions of the vessel  Prox RCA to Mid RCA lesion is 50% stenosed.    Intervention    No interventions have been documented.    Left Heart   Aortic Valve There is moderate aortic valve stenosis. The aortic valve is calcified. There is restricted aortic valve motion.    Coronary Diagrams   Diagnostic Dominance: Co-dominant  Intervention    Implants    No implant documentation for this case.    Syngo Images    Show images for CARDIAC CATHETERIZATION Images on Long Term Storage    Show images for Damany, Krikorian to Procedure Log   Procedure Log    Hemo Data   Flowsheet Row Most Recent Value  Fick Cardiac Output 5.05 L/min  Fick Cardiac Output Index 2.06 (L/min)/BSA  Aortic Mean Gradient 26.86 mmHg  Aortic Peak Gradient 22.9 mmHg  Aortic Valve Area 1.16  Aortic Value Area Index 0.47 cm2/BSA  RA A Wave 11 mmHg  RA V Wave 10 mmHg  RA Mean 7 mmHg  RV Systolic Pressure 42 mmHg  RV Diastolic Pressure 0 mmHg  RV EDP 11 mmHg  PA Systolic Pressure 35 mmHg  PA Diastolic Pressure 18 mmHg  PA Mean 27 mmHg  PW A Wave 12 mmHg  PW V Wave 13 mmHg  PW Mean 10 mmHg  AO Systolic Pressure 120 mmHg  AO Diastolic Pressure 73 mmHg  AO Mean 92 mmHg  LV Systolic Pressure 149 mmHg  LV Diastolic Pressure 7 mmHg  LV EDP 13 mmHg  AOp Systolic Pressure 115 mmHg  AOp Diastolic Pressure 68 mmHg  AOp Mean Pressure 87 mmHg  LVp Systolic Pressure 145 mmHg  LVp Diastolic Pressure 36 mmHg  LVp EDP Pressure 15 mmHg  QP/QS 1  TPVR Index 13.09 HRUI  TSVR Index 44.59 HRUI  PVR SVR Ratio 0.2  TPVR/TSVR Ratio 0.29    ADDENDUM REPORT: 01/21/2023 22:20   CLINICAL DATA:  39M with severe aortic stenosis being evaluated for a TAVR procedure.   EXAM: Cardiac TAVR CT   TECHNIQUE: The patient was scanned on a [employer]. A 120 kV retrospective scan was triggered in the descending thoracic aorta at 111 HU's.  Gantry rotation speed was 250 msecs and collimation was .6 mm. No beta blockade or nitro were given. The 3D data set was reconstructed in 5% intervals of the R-R cycle. Systolic and diastolic phases were analyzed on a dedicated work station using MPR, MIP and VRT modes. The patient received 100 cc of contrast.   FINDINGS: Aortic Root:   Aortic valve: Trileaflet   Aortic valve calcium score: 3071   Aortic annulus:   Diameter: 32mm x 25mm   Perimeter: 88mm   Area: 584 mm^2   Calcifications: No calcifications   Coronary height: Min Left - 21mm, Min  Right - 21mm   Sinotubular height: Left cusp - 30mm; Right cusp - 29mm; Noncoronary cusp - 27mm   LVOT (as measured 3 mm below the annulus):   Diameter: 33mm x 24mm   Area: 612 mm^2   Calcifications: No calcifications   Aortic sinus width: Left cusp - 38mm; Right cusp - 36mm; Noncoronary cusp - 37mm   Sinotubular junction width: 33mm x 32mm   Optimum Fluoroscopic Angle for Delivery: LAO 18 CRA 17   Cardiac:   Right atrium: Mild enlargement   Right ventricle: Mild enlargement.  S/p PPM   Pulmonary arteries: Dilated main PA measuring 31mm   Pulmonary veins: Normal configuration   Left atrium: Moderate enlargement   Left ventricle: Normal size   Pericardium: Normal thickness   Coronary arteries: Normal origins. Coronary calcium score 447 (69th percentile)   IMPRESSION: 1. Trileaflet aortic valve with severe calcifications (AV calcium score 3071)   2. Aortic annulus measures 32mm x 25mm in diameter with perimeter 88mm and area 584 mm^2. No annular or LVOT calcifications. Annular measurements are suitable for delivery of 29mm Edwards Sapien 3 valve   3.  Sufficient coronary to annulus distance.   4.  Optimum Fluoroscopic Angle for Delivery: LAO 18 CRA 17   5.  Coronary calcium score 447 (69th percentile)     Electronically Signed   By: Epifanio Lesches M.D.   On: 01/21/2023 22:20    Addended by  Little Ishikawa, MD on 01/21/2023 10:22 PM    Study Result   Narrative & Impression  EXAM: OVER-READ INTERPRETATION  CT CHEST   The following report is a limited chest CT over-read performed by radiologist Dr. Allegra Lai of [facility] on 01/21/2023. This over-read does not include interpretation of cardiac or coronary anatomy or pathology. The cardiac TAVR interpretation by the cardiologist is attached.   COMPARISON:  None Available.   FINDINGS: Extracardiac findings will be described separately under dictation for contemporaneously obtained CTA chest, abdomen and pelvis.   IMPRESSION: Please see separate dictation for contemporaneously obtained CTA chest, abdomen and pelvis dated 01/21/2023 for full description of relevant extracardiac findings.   Electronically Signed: By: Allegra Lai M.D. On: 01/21/2023 12:27          Narrative & Impression  CLINICAL DATA:  Aortic valve replacement preop evaluation   EXAM: CT ANGIOGRAPHY CHEST, ABDOMEN AND PELVIS   TECHNIQUE: Non-contrast CT of the chest was initially obtained.   Multidetector CT imaging through the chest, abdomen and pelvis was performed using the standard protocol during bolus administration of intravenous contrast. Multiplanar reconstructed images and MIPs were obtained and reviewed to evaluate the vascular anatomy.   RADIATION DOSE REDUCTION: This exam was performed according to the departmental dose-optimization program which includes automated exposure control, adjustment of the mA and/or kV according to patient size and/or use of iterative reconstruction technique.   CONTRAST:  OMNIPAQUE IOHEXOL 350 MG/ML SOLN   COMPARISON:  CT abdomen and pelvis dated June 14, 2013   FINDINGS: CTA CHEST FINDINGS   Cardiovascular: Normal heart size. No pericardial effusion. Normal caliber thoracic aorta with moderate atherosclerotic disease. Aortic valve thickening and  calcifications. Coronary artery calcifications of the LAD and circumflex.   Mediastinum/Nodes: Thyroid and esophagus are unremarkable. No enlarged lymph nodes seen in the chest.   Lungs/Pleura: Central airways are patent. No consolidation, pleural effusion or pneumothorax. Mild paraseptal emphysema. Mild subpleural reticular opacities.   Musculoskeletal: No chest wall abnormality. No acute or significant osseous  findings.   CTA ABDOMEN AND PELVIS FINDINGS   Hepatobiliary: No suspicious liver lesions. Gallstones. No gallbladder wall thickening. No biliary ductal dilation.   Pancreas: Unremarkable. No pancreatic ductal dilatation or surrounding inflammatory changes.   Spleen: Normal in size without focal abnormality.   Adrenals/Urinary Tract: Bilateral adrenal gland adenomas, unchanged when compared with 2014 prior, no specific follow-up imaging is recommended. No hydronephrosis or nephrolithiasis. Bladder is unremarkable.   Stomach/Bowel: Stomach is within normal limits. Appendix appears normal. Diverticulosis. No evidence of bowel wall thickening, distention, or inflammatory changes.   Vascular/lymphatic: Normal caliber abdominal aorta with moderate atherosclerotic disease causing moderate narrowing of the proximal bilateral renal arteries and SMA. No enlarged lymph nodes seen in the abdomen or pelvis.   Reproductive: Prostate is unremarkable.   Other: Small right-greater-than-left fat containing inguinal hernias. No abdominopelvic ascites.   Musculoskeletal: No acute or significant osseous findings.   VASCULAR MEASUREMENTS PERTINENT TO TAVR:   AORTA:   Minimal Aortic Diameter-16.8 mm   Severity of Aortic Calcification-moderate   RIGHT PELVIS:   Right Common Iliac Artery -   Minimal Diameter-7.0 mm   Tortuosity-moderate   Calcification-severe   Right External Iliac Artery -   Minimal Diameter-6.3 mm   Tortuosity-mild   Calcification-severe   Right  Common Femoral Artery -   Minimal Diameter-6.5 mm   Tortuosity-none   Calcification-severe   LEFT PELVIS:   Left Common Iliac Artery -   Minimal Diameter-6.0 mm   Tortuosity-moderate   Calcification-severe   Left External Iliac Artery -   Minimal Diameter-5.0 mm   Tortuosity-moderate   Calcification-moderate   Left Common Femoral Artery -   Minimal Diameter-5.7 mm   Tortuosity-none   Calcification-severe   Review of the MIP images confirms the above findings.   IMPRESSION: Vascular:   1. Vascular findings and measurements pertinent to potential TAVR procedure, as detailed above. 2. Thickening and calcification of the aortic valve, compatible with reported clinical history of aortic stenosis. 3. Moderate aortic and severe iliofemoral atherosclerosis. Two coronary artery disease.   Nonvascular:   1. Mild subpleural reticular opacities, findings can be seen in setting of interstitial lung disease. Correlate with patient's systems and consider pulmonary function tests and/or ILD protocol CT.     Electronically Signed   By: Allegra Lai M.D.   On: 01/21/2023 12:26      Impression:   This 73 year old gentleman has stage D, severe, symptomatic aortic stenosis with NYHA class II symptoms of exertional fatigue consistent with chronic diastolic congestive heart failure.  He has had 3 admissions for acute congestive heart failure over the past year.  I have personally reviewed his 2D echocardiogram, cardiac catheterization, and CTA studies.  His echocardiogram shows a calcified and thickened aortic valve with restricted leaflet mobility.  The mean gradient was 34.5 mmHg with a peak velocity of 4.15 m/s.  Cardiac catheterization showed no significant coronary stenosis.  The mean gradient across the aortic valve was measured at 27 mmHg.  I agree that aortic valve replacement is indicated in this patient for relief of his symptoms and to prevent recurrent episodes  of acute congestive heart failure and left ventricular deterioration.  Given his age and comorbidities I think that transcatheter aortic valve replacement would be the best option for treating him.  His gated cardiac CTA shows anatomy suitable for TAVR using a 29 mm SAPIEN 3 valve.  His abdominal and pelvic CTA shows adequate pelvic vascular anatomy to allow transfemoral insertion.   The patient was  counseled at length regarding treatment alternatives for management of severe symptomatic aortic stenosis. The risks and benefits of surgical intervention has been discussed in detail. Long-term prognosis with medical therapy was discussed. Alternative approaches such as conventional surgical aortic valve replacement, transcatheter aortic valve replacement, and palliative medical therapy were compared and contrasted at length. This discussion was placed in the context of the patient's own specific clinical presentation and past medical history. All of his questions have been addressed.    Following the decision to proceed with transcatheter aortic valve replacement, a discussion was held regarding what types of management strategies would be attempted intraoperatively in the event of life-threatening complications, including whether or not the patient would be considered a candidate for the use of cardiopulmonary bypass and/or conversion to open sternotomy for attempted surgical intervention.  Given his morbid obesity, ongoing smoking, moderately calcified ascending aorta, resting hypercarbia and hypoxemia on ABG and other comorbidities I do not think he would be a good candidate for emergent sternotomy to manage any intraoperative complications. The patient has been advised of a variety of complications that might develop including but not limited to risks of death, stroke, paravalvular leak, aortic dissection or other major vascular complications, aortic annulus rupture, device embolization, cardiac rupture or  perforation, mitral regurgitation, acute myocardial infarction, arrhythmia, heart block or bradycardia requiring permanent pacemaker placement, congestive heart failure, respiratory failure, renal failure, pneumonia, infection, other late complications related to structural valve deterioration or migration, or other complications that might ultimately cause a temporary or permanent loss of functional independence or other long term morbidity. The patient provides full informed consent for the procedure as described and all questions were answered.       Plan:   Transfemoral TAVR using a Sapien 3 valve.    Alleen Borne, MD

## 2023-06-25 ENCOUNTER — Telehealth: Payer: Self-pay

## 2023-06-25 ENCOUNTER — Other Ambulatory Visit: Payer: Self-pay | Admitting: Physician Assistant

## 2023-06-25 ENCOUNTER — Inpatient Hospital Stay (HOSPITAL_COMMUNITY): Payer: Medicare Other | Admitting: Physician Assistant

## 2023-06-25 ENCOUNTER — Other Ambulatory Visit: Payer: Self-pay

## 2023-06-25 ENCOUNTER — Inpatient Hospital Stay (HOSPITAL_COMMUNITY)
Admission: RE | Admit: 2023-06-25 | Discharge: 2023-06-26 | DRG: 267 | Disposition: A | Discharge status: Home / Self Care | Payer: Medicare Other | Attending: Cardiovascular Disease | Admitting: Cardiovascular Disease

## 2023-06-25 ENCOUNTER — Inpatient Hospital Stay (HOSPITAL_COMMUNITY): Payer: Medicare Other | Admitting: Anesthesiology

## 2023-06-25 ENCOUNTER — Encounter (HOSPITAL_COMMUNITY): Payer: Self-pay | Admitting: Cardiovascular Disease

## 2023-06-25 ENCOUNTER — Encounter (HOSPITAL_COMMUNITY)
Admission: RE | Disposition: A | Discharge status: Home / Self Care | Payer: Medicare Other | Source: Home / Self Care | Attending: Cardiovascular Disease

## 2023-06-25 ENCOUNTER — Inpatient Hospital Stay (HOSPITAL_COMMUNITY): Payer: Medicare Other

## 2023-06-25 DIAGNOSIS — Z79899 Other long term (current) drug therapy: Secondary | ICD-10-CM | POA: Diagnosis not present

## 2023-06-25 DIAGNOSIS — I4819 Other persistent atrial fibrillation: Secondary | ICD-10-CM | POA: Diagnosis present

## 2023-06-25 DIAGNOSIS — I509 Heart failure, unspecified: Secondary | ICD-10-CM | POA: Diagnosis not present

## 2023-06-25 DIAGNOSIS — I130 Hypertensive heart and chronic kidney disease with heart failure and stage 1 through stage 4 chronic kidney disease, or unspecified chronic kidney disease: Secondary | ICD-10-CM | POA: Diagnosis not present

## 2023-06-25 DIAGNOSIS — Z7951 Long term (current) use of inhaled steroids: Secondary | ICD-10-CM

## 2023-06-25 DIAGNOSIS — E1140 Type 2 diabetes mellitus with diabetic neuropathy, unspecified: Secondary | ICD-10-CM | POA: Diagnosis not present

## 2023-06-25 DIAGNOSIS — Z794 Long term (current) use of insulin: Secondary | ICD-10-CM

## 2023-06-25 DIAGNOSIS — G4733 Obstructive sleep apnea (adult) (pediatric): Secondary | ICD-10-CM | POA: Diagnosis not present

## 2023-06-25 DIAGNOSIS — Z006 Encounter for examination for normal comparison and control in clinical research program: Secondary | ICD-10-CM

## 2023-06-25 DIAGNOSIS — Z7985 Long-term (current) use of injectable non-insulin antidiabetic drugs: Secondary | ICD-10-CM

## 2023-06-25 DIAGNOSIS — E1151 Type 2 diabetes mellitus with diabetic peripheral angiopathy without gangrene: Secondary | ICD-10-CM | POA: Diagnosis not present

## 2023-06-25 DIAGNOSIS — Z952 Presence of prosthetic heart valve: Secondary | ICD-10-CM

## 2023-06-25 DIAGNOSIS — F17210 Nicotine dependence, cigarettes, uncomplicated: Secondary | ICD-10-CM | POA: Diagnosis present

## 2023-06-25 DIAGNOSIS — Z950 Presence of cardiac pacemaker: Secondary | ICD-10-CM

## 2023-06-25 DIAGNOSIS — Z7901 Long term (current) use of anticoagulants: Secondary | ICD-10-CM

## 2023-06-25 DIAGNOSIS — Z720 Tobacco use: Secondary | ICD-10-CM | POA: Diagnosis present

## 2023-06-25 DIAGNOSIS — Z9981 Dependence on supplemental oxygen: Secondary | ICD-10-CM | POA: Diagnosis not present

## 2023-06-25 DIAGNOSIS — D6869 Other thrombophilia: Secondary | ICD-10-CM | POA: Diagnosis not present

## 2023-06-25 DIAGNOSIS — Z800 Family history of malignant neoplasm of digestive organs: Secondary | ICD-10-CM

## 2023-06-25 DIAGNOSIS — G629 Polyneuropathy, unspecified: Secondary | ICD-10-CM

## 2023-06-25 DIAGNOSIS — E66812 Obesity, class 2: Secondary | ICD-10-CM | POA: Diagnosis present

## 2023-06-25 DIAGNOSIS — J449 Chronic obstructive pulmonary disease, unspecified: Secondary | ICD-10-CM | POA: Diagnosis not present

## 2023-06-25 DIAGNOSIS — Z6833 Body mass index (BMI) 33.0-33.9, adult: Secondary | ICD-10-CM | POA: Diagnosis not present

## 2023-06-25 DIAGNOSIS — Z8249 Family history of ischemic heart disease and other diseases of the circulatory system: Secondary | ICD-10-CM

## 2023-06-25 DIAGNOSIS — E1122 Type 2 diabetes mellitus with diabetic chronic kidney disease: Secondary | ICD-10-CM | POA: Diagnosis present

## 2023-06-25 DIAGNOSIS — N183 Chronic kidney disease, stage 3 unspecified: Secondary | ICD-10-CM | POA: Diagnosis present

## 2023-06-25 DIAGNOSIS — Z833 Family history of diabetes mellitus: Secondary | ICD-10-CM | POA: Diagnosis not present

## 2023-06-25 DIAGNOSIS — E119 Type 2 diabetes mellitus without complications: Secondary | ICD-10-CM

## 2023-06-25 DIAGNOSIS — I350 Nonrheumatic aortic (valve) stenosis: Secondary | ICD-10-CM

## 2023-06-25 DIAGNOSIS — R0902 Hypoxemia: Secondary | ICD-10-CM | POA: Diagnosis present

## 2023-06-25 DIAGNOSIS — I5032 Chronic diastolic (congestive) heart failure: Secondary | ICD-10-CM | POA: Diagnosis present

## 2023-06-25 DIAGNOSIS — E876 Hypokalemia: Secondary | ICD-10-CM | POA: Diagnosis present

## 2023-06-25 DIAGNOSIS — I10 Essential (primary) hypertension: Secondary | ICD-10-CM | POA: Diagnosis present

## 2023-06-25 DIAGNOSIS — I110 Hypertensive heart disease with heart failure: Secondary | ICD-10-CM | POA: Diagnosis not present

## 2023-06-25 DIAGNOSIS — Z7984 Long term (current) use of oral hypoglycemic drugs: Secondary | ICD-10-CM

## 2023-06-25 HISTORY — PX: TRANSCATHETER AORTIC VALVE REPLACEMENT, TRANSFEMORAL: SHX6400

## 2023-06-25 HISTORY — DX: Presence of prosthetic heart valve: Z95.2

## 2023-06-25 HISTORY — PX: INTRAOPERATIVE TRANSTHORACIC ECHOCARDIOGRAM: SHX6523

## 2023-06-25 HISTORY — DX: Nonrheumatic aortic (valve) stenosis: I35.0

## 2023-06-25 LAB — BASIC METABOLIC PANEL
Anion gap: 9 (ref 5–15)
BUN: 8 mg/dL (ref 8–23)
CO2: 28 mmol/L (ref 22–32)
Calcium: 9.2 mg/dL (ref 8.9–10.3)
Chloride: 97 mmol/L — ABNORMAL LOW (ref 98–111)
Creatinine, Ser: 1.04 mg/dL (ref 0.61–1.24)
GFR, Estimated: >60 mL/min (ref >60)
Glucose, Bld: 134 mg/dL — ABNORMAL HIGH (ref 70–99)
Potassium: 2.9 mmol/L — ABNORMAL LOW (ref 3.5–5.1)
Sodium: 134 mmol/L — ABNORMAL LOW (ref 135–145)

## 2023-06-25 LAB — ECHOCARDIOGRAM LIMITED
AR max vel: 2.62 cm2
AV Area VTI: 2.51 cm2
AV Area mean vel: 2.51 cm2
AV Mean grad: 6 mm[Hg]
AV Peak grad: 11.4 mm[Hg]
Ao pk vel: 1.69 m/s
Est EF: 55
S' Lateral: 3.3 cm

## 2023-06-25 LAB — GLUCOSE, CAPILLARY
Glucose-Capillary: 122 mg/dL — ABNORMAL HIGH (ref 70–99)
Glucose-Capillary: 114 mg/dL — ABNORMAL HIGH (ref 70–99)
Glucose-Capillary: 113 mg/dL — ABNORMAL HIGH (ref 70–99)
Glucose-Capillary: 224 mg/dL — ABNORMAL HIGH (ref 70–99)

## 2023-06-25 LAB — POCT I-STAT, CHEM 8
BUN: 9 mg/dL (ref 8–23)
Calcium, Ion: 1.14 mmol/L — ABNORMAL LOW (ref 1.15–1.40)
Chloride: 98 mmol/L (ref 98–111)
Creatinine, Ser: 0.8 mg/dL (ref 0.61–1.24)
Glucose, Bld: 119 mg/dL — ABNORMAL HIGH (ref 70–99)
HCT: 42 % (ref 39.0–52.0)
Hemoglobin: 14.3 g/dL (ref 13.0–17.0)
Potassium: 3.2 mmol/L — ABNORMAL LOW (ref 3.5–5.1)
Sodium: 141 mmol/L (ref 135–145)
TCO2: 29 mmol/L (ref 22–32)

## 2023-06-25 LAB — ABO/RH: ABO/RH(D): O NEG

## 2023-06-25 LAB — POCT ACTIVATED CLOTTING TIME: Activated Clotting Time: 318 s

## 2023-06-25 SURGERY — IMPLANTATION, AORTIC VALVE, TRANSCATHETER, FEMORAL APPROACH
Anesthesia: Monitor Anesthesia Care | Laterality: Right

## 2023-06-25 MED ORDER — ACETAMINOPHEN 650 MG RE SUPP: 650.0000 mg | Freq: Four times a day (QID) | RECTAL | Status: DC | PRN

## 2023-06-25 MED ORDER — FLECAINIDE ACETATE 100 MG PO TABS
100.0000 mg | ORAL_TABLET | Freq: Two times a day (BID) | ORAL | Status: DC
  Administered 2023-06-25 – 2023-06-26 (×2): 100 mg via ORAL
  Filled 2023-06-25 (×3): qty 1

## 2023-06-25 MED ORDER — POTASSIUM CHLORIDE CRYS ER 20 MEQ PO TBCR: 20.0000 meq | EXTENDED_RELEASE_TABLET | Freq: Two times a day (BID) | ORAL | 3 refills | Status: DC

## 2023-06-25 MED ORDER — SODIUM CHLORIDE 0.9 % IV SOLN: INTRAVENOUS | Status: DC

## 2023-06-25 MED ORDER — ACETAMINOPHEN 325 MG PO TABS: 650.0000 mg | ORAL_TABLET | Freq: Four times a day (QID) | ORAL | Status: DC | PRN

## 2023-06-25 MED ORDER — PHENYLEPHRINE HCL-NACL 20-0.9 MG/250ML-% IV SOLN
INTRAVENOUS | Status: DC | PRN
  Administered 2023-06-25: 40 ug/min via INTRAVENOUS

## 2023-06-25 MED ORDER — NITROGLYCERIN IN D5W 200-5 MCG/ML-% IV SOLN: 0.0000 ug/min | INTRAVENOUS | Status: DC

## 2023-06-25 MED ORDER — HEPARIN SODIUM (PORCINE) 1000 UNIT/ML IJ SOLN
INTRAMUSCULAR | Status: DC | PRN
Start: 2023-06-25 — End: 2023-06-25
  Administered 2023-06-25: 15000 [IU] via INTRAVENOUS

## 2023-06-25 MED ORDER — CHLORHEXIDINE GLUCONATE 4 % EX SOLN
30.0000 mL | CUTANEOUS | Status: DC
  Filled 2023-06-25: qty 30

## 2023-06-25 MED ORDER — PROPOFOL 10 MG/ML IV BOLUS
INTRAVENOUS | Status: DC | PRN
  Administered 2023-06-25 (×2): 20 mg via INTRAVENOUS

## 2023-06-25 MED ORDER — CHLORHEXIDINE GLUCONATE 4 % EX SOLN
60.0000 mL | Freq: Once | CUTANEOUS | Status: DC
  Filled 2023-06-25: qty 60

## 2023-06-25 MED ORDER — SODIUM CHLORIDE 0.9 % IV SOLN: 250.0000 mL | INTRAVENOUS | Status: DC

## 2023-06-25 MED ORDER — LIDOCAINE HCL (PF) 1 % IJ SOLN
INTRAMUSCULAR | Status: DC | PRN
  Administered 2023-06-25: 2 mL
  Administered 2023-06-25 (×2): 5 mL

## 2023-06-25 MED ORDER — SODIUM CHLORIDE 0.9% FLUSH
3.0000 mL | Freq: Two times a day (BID) | INTRAVENOUS | Status: DC
  Administered 2023-06-25 – 2023-06-26 (×2): 3 mL via INTRAVENOUS

## 2023-06-25 MED ORDER — FENTANYL CITRATE (PF) 100 MCG/2ML IJ SOLN
INTRAMUSCULAR | Status: AC
  Filled 2023-06-25: qty 2

## 2023-06-25 MED ORDER — POTASSIUM CHLORIDE CRYS ER 20 MEQ PO TBCR: 40.0000 meq | EXTENDED_RELEASE_TABLET | Freq: Once | ORAL | Status: AC

## 2023-06-25 MED ORDER — PROPOFOL 500 MG/50ML IV EMUL
INTRAVENOUS | Status: DC | PRN
  Administered 2023-06-25: 70 ug/kg/min via INTRAVENOUS

## 2023-06-25 MED ORDER — ONDANSETRON HCL 4 MG/2ML IJ SOLN: 4.0000 mg | Freq: Four times a day (QID) | INTRAMUSCULAR | Status: DC | PRN

## 2023-06-25 MED ORDER — INSULIN ASPART 100 UNIT/ML IJ SOLN: 0.0000 [IU] | INTRAMUSCULAR | Status: DC | PRN

## 2023-06-25 MED ORDER — APIXABAN 5 MG PO TABS
5.0000 mg | ORAL_TABLET | Freq: Two times a day (BID) | ORAL | Status: DC
  Administered 2023-06-25 – 2023-06-26 (×2): 5 mg via ORAL
  Filled 2023-06-25 (×2): qty 1

## 2023-06-25 MED ORDER — PROTAMINE SULFATE 10 MG/ML IV SOLN
INTRAVENOUS | Status: AC
  Filled 2023-06-25: qty 5

## 2023-06-25 MED ORDER — POTASSIUM CHLORIDE 10 MEQ/100ML IV SOLN
INTRAVENOUS | Status: AC
  Administered 2023-06-25: 10 meq via INTRAVENOUS
  Filled 2023-06-25: qty 100

## 2023-06-25 MED ORDER — ONDANSETRON HCL 4 MG/2ML IJ SOLN
INTRAMUSCULAR | Status: DC | PRN
  Administered 2023-06-25: 4 mg via INTRAVENOUS

## 2023-06-25 MED ORDER — SODIUM CHLORIDE 0.9 % IV SOLN: 250.0000 mL | INTRAVENOUS | Status: DC | PRN

## 2023-06-25 MED ORDER — POTASSIUM CHLORIDE CRYS ER 20 MEQ PO TBCR
EXTENDED_RELEASE_TABLET | ORAL | Status: AC
  Administered 2023-06-25: 40 meq via ORAL
  Filled 2023-06-25: qty 2

## 2023-06-25 MED ORDER — IODIXANOL 320 MG/ML IV SOLN
INTRAVENOUS | Status: DC | PRN
  Administered 2023-06-25: 55 mL

## 2023-06-25 MED ORDER — INSULIN ASPART 100 UNIT/ML IJ SOLN
0.0000 [IU] | Freq: Three times a day (TID) | INTRAMUSCULAR | Status: DC
  Administered 2023-06-25 – 2023-06-26 (×2): 8 [IU] via SUBCUTANEOUS

## 2023-06-25 MED ORDER — MORPHINE SULFATE (PF) 2 MG/ML IV SOLN: 1.0000 mg | INTRAVENOUS | Status: DC | PRN

## 2023-06-25 MED ORDER — CEFAZOLIN SODIUM-DEXTROSE 2-4 GM/100ML-% IV SOLN
2.0000 g | Freq: Three times a day (TID) | INTRAVENOUS | Status: AC
  Administered 2023-06-25 – 2023-06-26 (×2): 2 g via INTRAVENOUS
  Filled 2023-06-25 (×2): qty 100

## 2023-06-25 MED ORDER — POTASSIUM CHLORIDE 10 MEQ/100ML IV SOLN
INTRAVENOUS | Status: AC
  Filled 2023-06-25: qty 300

## 2023-06-25 MED ORDER — LACTATED RINGERS IV SOLN: INTRAVENOUS | Status: DC

## 2023-06-25 MED ORDER — POTASSIUM CHLORIDE 10 MEQ/100ML IV SOLN
10.0000 meq | INTRAVENOUS | Status: AC
  Administered 2023-06-25 (×3): 10 meq via INTRAVENOUS

## 2023-06-25 MED ORDER — FENTANYL CITRATE (PF) 100 MCG/2ML IJ SOLN
INTRAMUSCULAR | Status: DC | PRN
  Administered 2023-06-25: 100 ug via INTRAVENOUS

## 2023-06-25 MED ORDER — HEPARIN (PORCINE) IN NACL 1000-0.9 UT/500ML-% IV SOLN
INTRAVENOUS | Status: DC | PRN
  Administered 2023-06-25 (×2): 500 mL

## 2023-06-25 MED ORDER — DEXMEDETOMIDINE HCL IN NACL 80 MCG/20ML IV SOLN
INTRAVENOUS | Status: DC | PRN
Start: 2023-06-25 — End: 2023-06-25
  Administered 2023-06-25 (×3): 8 ug via INTRAVENOUS

## 2023-06-25 MED ORDER — CHLORHEXIDINE GLUCONATE 0.12 % MT SOLN
15.0000 mL | Freq: Once | OROMUCOSAL | Status: AC
  Administered 2023-06-25: 15 mL via OROMUCOSAL
  Filled 2023-06-25 (×2): qty 15

## 2023-06-25 MED ORDER — SODIUM CHLORIDE 0.9 % IV SOLN: INTRAVENOUS | Status: AC

## 2023-06-25 MED ORDER — POTASSIUM CHLORIDE CRYS ER 20 MEQ PO TBCR
20.0000 meq | EXTENDED_RELEASE_TABLET | Freq: Two times a day (BID) | ORAL | Status: DC
  Administered 2023-06-25 – 2023-06-26 (×2): 20 meq via ORAL
  Filled 2023-06-25 (×2): qty 1

## 2023-06-25 MED ORDER — SODIUM CHLORIDE 0.9% FLUSH: 3.0000 mL | INTRAVENOUS | Status: DC | PRN

## 2023-06-25 MED ORDER — HEPARIN SODIUM (PORCINE) 1000 UNIT/ML IJ SOLN
INTRAMUSCULAR | Status: AC
  Filled 2023-06-25: qty 20

## 2023-06-25 MED ORDER — PROTAMINE SULFATE 10 MG/ML IV SOLN
INTRAVENOUS | Status: DC | PRN
Start: 2023-06-25 — End: 2023-06-25
  Administered 2023-06-25 (×2): 20 mg via INTRAVENOUS
  Administered 2023-06-25: 10 mg via INTRAVENOUS

## 2023-06-25 MED ORDER — TRAMADOL HCL 50 MG PO TABS: 50.0000 mg | ORAL_TABLET | ORAL | Status: DC | PRN

## 2023-06-25 MED ORDER — OXYCODONE HCL 5 MG PO TABS: 5.0000 mg | ORAL_TABLET | ORAL | Status: DC | PRN

## 2023-06-25 MED ORDER — ATORVASTATIN CALCIUM 40 MG PO TABS
40.0000 mg | ORAL_TABLET | Freq: Every day | ORAL | Status: DC
  Administered 2023-06-25 – 2023-06-26 (×2): 40 mg via ORAL
  Filled 2023-06-25 (×2): qty 1

## 2023-06-25 SURGICAL SUPPLY — 33 items
BAG SNAP BAND KOVER 36X36 (MISCELLANEOUS) ×4 IMPLANT
CABLE ADAPT PACING TEMP 12FT (ADAPTER) IMPLANT
CATH COMMANDER DELIVERY SYS 29 (CATHETERS) IMPLANT
CATH DIAG 6FR JR4 (CATHETERS) IMPLANT
CATH DIAG 6FR PIGTAIL ANGLED (CATHETERS) IMPLANT
CATH INFINITI 6F AL2 (CATHETERS) IMPLANT
CATH S G BIP PACING (CATHETERS) IMPLANT
CLOSURE MYNX CONTROL 6F/7F (Vascular Products) IMPLANT
CLOSURE PERCLOSE PROSTYLE (VASCULAR PRODUCTS) IMPLANT
COVER DOME SNAP 22 D (MISCELLANEOUS) IMPLANT
CRIMPER (MISCELLANEOUS) IMPLANT
DEVICE INFLATION ATRION QL38 (MISCELLANEOUS) IMPLANT
KIT HEART LEFT (KITS) ×2 IMPLANT
KIT MICROPUNCTURE NIT STIFF (SHEATH) IMPLANT
KIT SAPIAN 3 ULTRA RESILIA 29 (Valve) IMPLANT
PACK CARDIAC CATHETERIZATION (CUSTOM PROCEDURE TRAY) ×2 IMPLANT
PROTECTION STATION PRESSURIZED (MISCELLANEOUS) ×2
SET ATX-X65L (MISCELLANEOUS) IMPLANT
SHEATH BRITE TIP 7FR 35CM (SHEATH) IMPLANT
SHEATH INTRODUCER SET 29 (SHEATH) IMPLANT
SHEATH PINNACLE 6F 10CM (SHEATH) IMPLANT
SHEATH PINNACLE 8F 10CM (SHEATH) IMPLANT
SHEATH PROBE COVER 6X72 (BAG) IMPLANT
STATION PROTECTION PRESSURIZED (MISCELLANEOUS) IMPLANT
STOPCOCK MORSE 400PSI 3WAY (MISCELLANEOUS) ×4 IMPLANT
TRANSDUCER W/STOPCOCK (MISCELLANEOUS) ×4 IMPLANT
TUBING ART PRESS 72 MALE/FEM (TUBING) IMPLANT
WIRE AMPLATZ SS-J .035X180CM (WIRE) IMPLANT
WIRE EMERALD 3MM-J .035X150CM (WIRE) IMPLANT
WIRE EMERALD 3MM-J .035X260CM (WIRE) IMPLANT
WIRE EMERALD ST .035X260CM (WIRE) IMPLANT
WIRE MICROINTRODUCER 60CM (WIRE) IMPLANT
WIRE SAFARI SM CURVE 275 (WIRE) IMPLANT

## 2023-06-25 NOTE — Telephone Encounter (Signed)
New rx sent to pt's pharmacy on file. Pt already spoke with Georgie Chard, NP on 06/24/23 and has been taking increased dose.

## 2023-06-25 NOTE — Progress Notes (Signed)
  Echocardiogram 2D Echocardiogram has been performed.  Steven Henry 06/25/2023, 2:40 PM

## 2023-06-25 NOTE — Discharge Instructions (Signed)

## 2023-06-25 NOTE — Anesthesia Preprocedure Evaluation (Addendum)
Anesthesia Evaluation  Patient identified by MRN, date of birth, ID band Patient awake    Reviewed: Allergy & Precautions, NPO status , Patient's Chart, lab work & pertinent test results  Airway Mallampati: II  TM Distance: >3 FB Neck ROM: Full    Dental  (+) Edentulous Upper, Edentulous Lower   Pulmonary sleep apnea and Continuous Positive Airway Pressure Ventilation , COPD,  COPD inhaler and oxygen dependent, Current Smoker and Patient abstained from smoking. On O2 at night   Pulmonary exam normal        Cardiovascular hypertension, Pt. on home beta blockers + Peripheral Vascular Disease and +CHF  + pacemaker + Valvular Problems/Murmurs AS  Rhythm:Regular Rate:Normal + Systolic murmurs ECHO: 1. Left ventricular ejection fraction, by estimation, is 55 to 60%. The  left ventricle has normal function. The left ventricle has no regional  wall motion abnormalities. Left ventricular diastolic function could not  be evaluated.   2. Right ventricular systolic function is mildly reduced. The right  ventricular size is mildly enlarged. There is mildly elevated pulmonary  artery systolic pressure. The estimated right ventricular systolic  pressure is 43.5 mmHg.   3. Left atrial size was severely dilated.   4. Right atrial size was moderately dilated.   5. The mitral valve is grossly normal. No evidence of mitral valve  regurgitation. No evidence of mitral stenosis.   6. The aortic valve is calcified. There is moderate calcification of the  aortic valve. Aortic valve regurgitation is not visualized. Moderate to  severe aortic valve stenosis. Aortic valve area, by VTI measures 1.27 cm.  Aortic valve mean gradient  measures 34.5 mmHg. Aortic valve Vmax measures 4.15 m/s.   7. Aortic dilatation noted. There is borderline dilatation of the aortic  root, measuring 39 mm. There is mild dilatation of the ascending aorta,  measuring 40 mm.    8. The inferior vena cava is dilated in size with <50% respiratory  variability, suggesting right atrial pressure of 15 mmHg.     Neuro/Psych negative neurological ROS  negative psych ROS   GI/Hepatic negative GI ROS, Neg liver ROS,,,  Endo/Other  diabetes, Insulin Dependent, Oral Hypoglycemic Agents  Patient on GLP-1 Agonist  Renal/GU Renal disease     Musculoskeletal negative musculoskeletal ROS (+)    Abdominal  (+) + obese  Peds  Hematology negative hematology ROS (+)   Anesthesia Other Findings Severe Aortic Stenosis  Reproductive/Obstetrics                             Anesthesia Physical Anesthesia Plan  ASA: 4  Anesthesia Plan: MAC   Post-op Pain Management:    Induction:   PONV Risk Score and Plan: 0 and Treatment may vary due to age or medical condition  Airway Management Planned: Simple Face Mask  Additional Equipment:   Intra-op Plan:   Post-operative Plan:   Informed Consent: I have reviewed the patients History and Physical, chart, labs and discussed the procedure including the risks, benefits and alternatives for the proposed anesthesia with the patient or authorized representative who has indicated his/her understanding and acceptance.     Dental advisory given  Plan Discussed with: CRNA  Anesthesia Plan Comments:         Anesthesia Quick Evaluation

## 2023-06-25 NOTE — Progress Notes (Signed)
  [facility]   MULTIDISCIPLINARY HEART VALVE TEAM  Patient doing well s/p TAVR. He is hemodynamically stable. Groin sites stable. ECG with paced rhythm. Transferred from cath lab holding to 4E. Early ambulation after bedrest completed and hopeful discharge over the next 24-48 hours.   Cline Crock PA-C  [facility]  Pager [phone]

## 2023-06-25 NOTE — Progress Notes (Signed)
Patient arrived from PACU.  VSS.  No complaints.    06/25/23 1641  Vitals  Temp 97.6 F (36.4 C)  Temp Source Oral  BP 116/73  MAP (mmHg) 86  BP Location Right Arm  BP Method Automatic  Patient Position (if appropriate) Lying  Pulse Rate 68  Pulse Rate Source Monitor  ECG Heart Rate 66  Resp 16  Level of Consciousness  Level of Consciousness Alert  MEWS COLOR  MEWS Score Color Green  Oxygen Therapy  SpO2 97 %  O2 Device Nasal Cannula  O2 Flow Rate (L/min) 2 L/min  Pain Assessment  Pain Scale 0-10  Pain Score 0  PCA/Epidural/Spinal Assessment  Respiratory Pattern Regular;Unlabored  MEWS Score  MEWS Temp 0  MEWS Systolic 0  MEWS Pulse 0  MEWS RR 0  MEWS LOC 0  MEWS Score 0

## 2023-06-25 NOTE — Op Note (Signed)
[facility]   MULTIDISCIPLINARY HEART VALVE TEAM   TAVR OPERATIVE NOTE   Date of Procedure:  06/25/2023  Preoperative Diagnosis: Severe Aortic Stenosis   Postoperative Diagnosis: Same   Procedure:   Transcatheter Aortic Valve Replacement - Percutaneous Transfemoral Approach  Edwards Sapien 3 Ultra Resilia THV (size 29 mm, serial # [id number])   Co-Surgeons:  Evelene Croon, MD and [name], MD  Anesthesiologist:  Karna Christmas, MD  Echocardiographer:  Charlton Haws, MD  Pre-operative Echo Findings: Severe aortic stenosis Normal left ventricular systolic function  Post-operative Echo Findings: No paravalvular leak Normal/unchanged left ventricular systolic function  BRIEF CLINICAL NOTE AND INDICATIONS FOR SURGERY  72 year old male with severe, stage D severe aortic stenosis.  He has New York Heart Association functional class II symptoms of shortness of breath and exertional fatigue.  He has had hospital admissions for congestive heart failure over the past year.  Findings of preoperative studies have been consistent with severe aortic stenosis and he is referred for TAVR after multidisciplinary review of his case.  He is felt to have suitable vascular anatomy for transfemoral valve insertion.  During the course of the patient's preoperative work up they have been evaluated comprehensively by a multidisciplinary team of specialists coordinated through the Multidisciplinary Heart Valve Clinic in the [facility].  They have been demonstrated to suffer from symptomatic severe aortic stenosis as noted above. The patient has been counseled extensively as to the relative risks and benefits of all options for the treatment of severe aortic stenosis including long term medical therapy, conventional surgery for aortic valve replacement, and transcatheter aortic valve replacement.  The patient has been independently evaluated in formal cardiac  surgical consultation by Dr Laneta Simmers, who deemed the patient appropriate for TAVR. Based upon review of all of the patient's preoperative diagnostic tests they are felt to be candidate for transcatheter aortic valve replacement using the transfemoral approach as an alternative to conventional surgery.    Following the decision to proceed with transcatheter aortic valve replacement, a discussion has been held regarding what types of management strategies would be attempted intraoperatively in the event of life-threatening complications, including whether or not the patient would be considered a candidate for the use of cardiopulmonary bypass and/or conversion to open sternotomy for attempted surgical intervention.  The patient has been advised of a variety of complications that might develop peculiar to this approach including but not limited to risks of death, stroke, paravalvular leak, aortic dissection or other major vascular complications, aortic annulus rupture, device embolization, cardiac rupture or perforation, acute myocardial infarction, arrhythmia, heart block or bradycardia requiring permanent pacemaker placement, congestive heart failure, respiratory failure, renal failure, pneumonia, infection, other late complications related to structural valve deterioration or migration, or other complications that might ultimately cause a temporary or permanent loss of functional independence or other long term morbidity.  The patient provides full informed consent for the procedure as described and all questions were answered preoperatively.  DETAILS OF THE OPERATIVE PROCEDURE  PREPARATION:   The patient is brought to the operating room on the above mentioned date and central monitoring was established by the anesthesia team including placement of a radial arterial line. The patient is placed in the supine position on the operating table.  Intravenous antibiotics are administered. The patient is monitored  closely throughout the procedure under conscious sedation.  Baseline transthoracic echocardiogram is performed. The patient's chest, abdomen, both groins, and both lower extremities are prepared and draped  in a sterile manner. A time out procedure is performed.   PERIPHERAL ACCESS:   Using ultrasound guidance, femoral arterial and venous access is obtained with placement of 6 Fr sheaths on the left side.  [place] images are digitally captured and stored in the patient's chart. A pigtail diagnostic catheter was passed through the femoral arterial sheath under fluoroscopic guidance into the aortic root.  A temporary transvenous pacemaker catheter was passed through the femoral venous sheath under fluoroscopic guidance into the right ventricle.  The pacemaker was tested to ensure stable lead placement and pacemaker capture. Aortic root angiography was performed in order to determine the optimal angiographic angle for valve deployment.  TRANSFEMORAL ACCESS:  A micropuncture technique is used to access the right femoral artery under fluoroscopic and ultrasound guidance.  2 Perclose devices are deployed at 10' and 2' positions to 'PreClose' the femoral artery. An 8 French sheath is placed and then an Amplatz Superstiff wire is advanced through the sheath. This is changed out for a 16 French transfemoral E-Sheath after progressively dilating over the Superstiff wire.  An AL-2 catheter was used to direct a straight-tip exchange length wire across the native aortic valve into the left ventricle. This was exchanged out for a pigtail catheter and position was confirmed in the LV apex. Simultaneous LV and Ao pressures were recorded.  The pigtail catheter was exchanged for a Safari wire in the LV apex.    BALLOON AORTIC VALVULOPLASTY:  Not performed  TRANSCATHETER HEART VALVE DEPLOYMENT:  An Edwards Sapien 3 Ultra Resilia transcatheter heart valve (size 29 mm) was prepared and crimped per manufacturer's guidelines, and  the proper orientation of the valve is confirmed on the [employer] delivery system. The valve was advanced through the introducer sheath using normal technique until in an appropriate position in the abdominal aorta beyond the sheath tip. The balloon was then retracted and using the fine-tuning wheel was centered on the valve. The valve was then advanced across the aortic arch using appropriate flexion of the catheter. The valve was carefully positioned across the aortic valve annulus. The Commander catheter was retracted using normal technique. Once final position of the valve has been confirmed by angiographic assessment, the valve is deployed while temporarily holding ventilation and during rapid ventricular pacing to maintain systolic blood pressure < 50 mmHg and pulse pressure < 10 mmHg. The balloon inflation is held for >3 seconds after reaching full deployment volume. Once the balloon has fully deflated the balloon is retracted into the ascending aorta and valve function is assessed using echocardiography. The patient's hemodynamic recovery following valve deployment is good.  The deployment balloon and guidewire are both removed. Echo demostrated acceptable post-procedural gradients, stable mitral valve function, and no aortic insufficiency.    PROCEDURE COMPLETION:  The sheath was removed and femoral artery closure is performed using the 2 previously deployed Perclose devices.  Protamine is administered once femoral arterial repair was complete. The site is clear with no evidence of bleeding or hematoma after the sutures are tightened. The temporary pacemaker and pigtail catheters are removed. Mynx closure is used for contralateral femoral arterial hemostasis for the 6 Fr sheath.  The patient tolerated the procedure well and is transported to the recovery area in stable condition. There were no immediate intraoperative complications. All sponge instrument and needle counts are verified correct  at completion of the operation.   The patient received a total of 55 mL of intravenous contrast during the procedure.  EBL: minimal  FINAL  CONCLUSION: Successful TAVR with a 29 mm Sapien 3 Ultra Resilia valve, post-procedure mean gradient 6 mmHg with no PVL.   [name], MD 06/25/2023 3:20 PM

## 2023-06-25 NOTE — Plan of Care (Signed)
  Problem: Education: Goal: Understanding of CV disease, CV risk reduction, and recovery process will improve Outcome: Progressing Goal: Individualized Educational Video(s) Outcome: Progressing   Problem: Activity: Goal: Ability to return to baseline activity level will improve Outcome: Progressing   Problem: Cardiovascular: Goal: Ability to achieve and maintain adequate cardiovascular perfusion will improve Outcome: Progressing Goal: Vascular access site(s) Level 0-1 will be maintained Outcome: Progressing   Problem: Health Behavior/Discharge Planning: Goal: Ability to safely manage health-related needs after discharge will improve Outcome: Progressing   Problem: Education: Goal: Knowledge of cardiac device and self-care will improve Outcome: Progressing Goal: Ability to safely manage health related needs after discharge will improve Outcome: Progressing Goal: Individualized Educational Video(s) Outcome: Progressing   Problem: Cardiac: Goal: Ability to achieve and maintain adequate cardiopulmonary perfusion will improve Outcome: Progressing

## 2023-06-25 NOTE — Progress Notes (Signed)
Patient stated he wears CPAP at home at night. Placed patient on cpap for the night via auto-mode with oxygen set at 2lpm

## 2023-06-25 NOTE — Discharge Summary (Incomplete)
Accession #:    [id number]     Weight:       253.0 lb Date of Birth:  Sep 04, 1951      BSA:          2.378 m Patient Age:    72 years       BP:           121/79 mmHg Patient Gender: M              HR:           65 bpm. Exam Location:  Inpatient Procedure: 2D Echo, Cardiac Doppler and Color Doppler Indications:     TAVR procedure. Aortic stenosis.  History:         Patient has prior history of Echocardiogram examinations, most                  recent 10/26/2022. CHF, Pacemaker, COPD, Arrythmias:Atrial                  Fibrillation; Risk Factors:Sleep Apnea, Diabetes, Dyslipidemia                  and Current Smoker.                  Aortic Valve: 29 mm Edwards Ultra, stented (TAVR) valve is                  present in the aortic position. Procedure Date: 06/25/23.  Sonographer:     Delcie Roch RDCS Referring Phys:  [id number] Steven Henry Diagnosing Phys: Steven Haws MD IMPRESSIONS  1. Inferior basal hypokinesis . Left ventricular ejection fraction, by estimation, is 55%. There is mild left ventricular hypertrophy.  2. Right ventricular systolic function is mildly reduced. The right ventricular size is moderately enlarged.  3. Left atrial size was moderately dilated.  4. Right atrial size was moderately dilated.  5. The mitral valve is abnormal. Trivial mitral valve regurgitation.  6. Pre TAVR: tri leaflet severely calcified with mean gradient 25 peak 42 mmHg suppine in cath lab AVA 0.9 cm2          Post TAVR: well positioned 29 mm Sapien 3 valve. mean gradient 6 peak 11 mmHg No PVL AVA 2.53 cm2 . The aortic valve has been repaired/replaced. There is a 29 mm Edwards Ultra, stented (TAVR) valve present in the aortic position. Procedure Date: 06/25/23. FINDINGS  Left Ventricle: Inferior basal hypokinesis. Left ventricular ejection fraction, by estimation, is 55%. The left ventricular internal cavity size was normal in size. There is mild left ventricular hypertrophy. Right Ventricle: The right ventricular size is moderately enlarged. Right ventricular systolic function is mildly reduced. Left Atrium: Left atrial size was moderately dilated. Right Atrium: Right atrial size was moderately dilated. Mitral Valve: The mitral valve is abnormal. There is mild thickening of the mitral valve leaflet(s). There is mild calcification of the mitral valve leaflet(s). Mild mitral annular calcification. Trivial mitral valve regurgitation. Tricuspid Valve: Tricuspid valve regurgitation is mild. Aortic Valve: Pre TAVR: tri leaflet severely calcified with mean gradient 25 peak 42 mmHg suppine in cath lab AVA 0.9 cm2 Post TAVR: well positioned 29 mm Sapien 3 valve. mean gradient 6 peak 11 mmHg No PVL AVA 2.53 cm2. The aortic valve has been repaired/replaced. Aortic valve mean gradient measures 6.0 mmHg. Aortic valve peak gradient measures 11.4 mmHg. Aortic valve area, by VTI measures 2.51 cm. There is a 29 mm Edwards Ultra, stented (TAVR) valve present in the aortic  Accession #:    [id number]     Weight:       253.0 lb Date of Birth:  Sep 04, 1951      BSA:          2.378 m Patient Age:    72 years       BP:           121/79 mmHg Patient Gender: M              HR:           65 bpm. Exam Location:  Inpatient Procedure: 2D Echo, Cardiac Doppler and Color Doppler Indications:     TAVR procedure. Aortic stenosis.  History:         Patient has prior history of Echocardiogram examinations, most                  recent 10/26/2022. CHF, Pacemaker, COPD, Arrythmias:Atrial                  Fibrillation; Risk Factors:Sleep Apnea, Diabetes, Dyslipidemia                  and Current Smoker.                  Aortic Valve: 29 mm Edwards Ultra, stented (TAVR) valve is                  present in the aortic position. Procedure Date: 06/25/23.  Sonographer:     Delcie Roch RDCS Referring Phys:  [id number] Steven Henry Diagnosing Phys: Steven Haws MD IMPRESSIONS  1. Inferior basal hypokinesis . Left ventricular ejection fraction, by estimation, is 55%. There is mild left ventricular hypertrophy.  2. Right ventricular systolic function is mildly reduced. The right ventricular size is moderately enlarged.  3. Left atrial size was moderately dilated.  4. Right atrial size was moderately dilated.  5. The mitral valve is abnormal. Trivial mitral valve regurgitation.  6. Pre TAVR: tri leaflet severely calcified with mean gradient 25 peak 42 mmHg suppine in cath lab AVA 0.9 cm2          Post TAVR: well positioned 29 mm Sapien 3 valve. mean gradient 6 peak 11 mmHg No PVL AVA 2.53 cm2 . The aortic valve has been repaired/replaced. There is a 29 mm Edwards Ultra, stented (TAVR) valve present in the aortic position. Procedure Date: 06/25/23. FINDINGS  Left Ventricle: Inferior basal hypokinesis. Left ventricular ejection fraction, by estimation, is 55%. The left ventricular internal cavity size was normal in size. There is mild left ventricular hypertrophy. Right Ventricle: The right ventricular size is moderately enlarged. Right ventricular systolic function is mildly reduced. Left Atrium: Left atrial size was moderately dilated. Right Atrium: Right atrial size was moderately dilated. Mitral Valve: The mitral valve is abnormal. There is mild thickening of the mitral valve leaflet(s). There is mild calcification of the mitral valve leaflet(s). Mild mitral annular calcification. Trivial mitral valve regurgitation. Tricuspid Valve: Tricuspid valve regurgitation is mild. Aortic Valve: Pre TAVR: tri leaflet severely calcified with mean gradient 25 peak 42 mmHg suppine in cath lab AVA 0.9 cm2 Post TAVR: well positioned 29 mm Sapien 3 valve. mean gradient 6 peak 11 mmHg No PVL AVA 2.53 cm2. The aortic valve has been repaired/replaced. Aortic valve mean gradient measures 6.0 mmHg. Aortic valve peak gradient measures 11.4 mmHg. Aortic valve area, by VTI measures 2.51 cm. There is a 29 mm Edwards Ultra, stented (TAVR) valve present in the aortic  [facility]   MULTIDISCIPLINARY HEART VALVE TEAM  Discharge Summary    Patient ID: Steven Henry MRN: [id number]; DOB: [birth year]  Admit date: 06/25/2023 Discharge date: 06/26/2023  Primary Care Provider: Donita Brooks, MD  Primary Cardiologist: Marjo Bicker, MD / Dr. Excell Seltzer & Dr. Laneta Simmers (TAVR)  Discharge Diagnoses    Principal Problem:   S/P TAVR (transcatheter aortic valve replacement) Active Problems:   Morbid obesity (HCC)   Obstructive sleep apnea   DMII (diabetes mellitus, type 2) (HCC)   Essential hypertension   CKD (chronic kidney disease), stage III (HCC)   Neuropathy   Tobacco use   Chronic diastolic heart failure (HCC)   Hypercoagulable state due to persistent atrial fibrillation (HCC)   Severe aortic stenosis   Pacemaker   Allergies No Known Allergies  Diagnostic Studies/Procedures    TAVR OPERATIVE NOTE     Date of Procedure:                06/25/2023   Preoperative Diagnosis:      Severe Aortic Stenosis    Postoperative Diagnosis:    Same    Procedure:        Transcatheter Aortic Valve Replacement - Percutaneous Transfemoral Approach             Edwards Sapien 3 Ultra Resilia THV (size 29 mm, serial # [id number])              Co-Surgeons:                        Evelene Croon, MD and [name], MD   Anesthesiologist:                  Karna Christmas, MD   Echocardiographer:              Steven Haws, MD   Pre-operative Echo Findings: Severe aortic stenosis Normal left ventricular systolic function   Post-operative Echo Findings: No paravalvular leak Normal/unchanged left ventricular systolic function  _____________    Echo 06/26/23: completed but pending formal read at the time of discharge   History of Present Illness     Steven Henry is a 72 y.o. 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   His most recent echocardiogram on 10/26/2022 showed EF 55% and severe AS with mean grad 34.5 mmHg. [facility] 02/11/23 showed patent coronary arteries, codominant coronary anatomy, with no significant stenosis. He had essentially normal right and left heart diastolic filling pressures and preserved cardiac output and moderately severe aortic stenosis with mean gradient 27 mmHg and calculated valve area 1.16 cm, indexed valve area 0.47. He had poor dentition and underwent multiple extractions.   The patient was evaluated by the multidisciplinary valve team and felt to have severe, symptomatic aortic stenosis and to be a suitable candidate for TAVR, which was set up for 06/25/23.   Hospital Course     Consultants: none   Severe AS: s/p successful TAVR with a 29 mm Edwards Sapien 3 Ultra Resilia THV via the TF approach on 06/25/23. Post operative echo completed but pending formal read. Groin sites are stable. Resumed on home Eliquis 5mg  BID. Walked with cardiac rehab with no issues. Plan  Accession #:    [id number]     Weight:       253.0 lb Date of Birth:  Sep 04, 1951      BSA:          2.378 m Patient Age:    72 years       BP:           121/79 mmHg Patient Gender: M              HR:           65 bpm. Exam Location:  Inpatient Procedure: 2D Echo, Cardiac Doppler and Color Doppler Indications:     TAVR procedure. Aortic stenosis.  History:         Patient has prior history of Echocardiogram examinations, most                  recent 10/26/2022. CHF, Pacemaker, COPD, Arrythmias:Atrial                  Fibrillation; Risk Factors:Sleep Apnea, Diabetes, Dyslipidemia                  and Current Smoker.                  Aortic Valve: 29 mm Edwards Ultra, stented (TAVR) valve is                  present in the aortic position. Procedure Date: 06/25/23.  Sonographer:     Delcie Roch RDCS Referring Phys:  [id number] Steven Henry Diagnosing Phys: Steven Haws MD IMPRESSIONS  1. Inferior basal hypokinesis . Left ventricular ejection fraction, by estimation, is 55%. There is mild left ventricular hypertrophy.  2. Right ventricular systolic function is mildly reduced. The right ventricular size is moderately enlarged.  3. Left atrial size was moderately dilated.  4. Right atrial size was moderately dilated.  5. The mitral valve is abnormal. Trivial mitral valve regurgitation.  6. Pre TAVR: tri leaflet severely calcified with mean gradient 25 peak 42 mmHg suppine in cath lab AVA 0.9 cm2          Post TAVR: well positioned 29 mm Sapien 3 valve. mean gradient 6 peak 11 mmHg No PVL AVA 2.53 cm2 . The aortic valve has been repaired/replaced. There is a 29 mm Edwards Ultra, stented (TAVR) valve present in the aortic position. Procedure Date: 06/25/23. FINDINGS  Left Ventricle: Inferior basal hypokinesis. Left ventricular ejection fraction, by estimation, is 55%. The left ventricular internal cavity size was normal in size. There is mild left ventricular hypertrophy. Right Ventricle: The right ventricular size is moderately enlarged. Right ventricular systolic function is mildly reduced. Left Atrium: Left atrial size was moderately dilated. Right Atrium: Right atrial size was moderately dilated. Mitral Valve: The mitral valve is abnormal. There is mild thickening of the mitral valve leaflet(s). There is mild calcification of the mitral valve leaflet(s). Mild mitral annular calcification. Trivial mitral valve regurgitation. Tricuspid Valve: Tricuspid valve regurgitation is mild. Aortic Valve: Pre TAVR: tri leaflet severely calcified with mean gradient 25 peak 42 mmHg suppine in cath lab AVA 0.9 cm2 Post TAVR: well positioned 29 mm Sapien 3 valve. mean gradient 6 peak 11 mmHg No PVL AVA 2.53 cm2. The aortic valve has been repaired/replaced. Aortic valve mean gradient measures 6.0 mmHg. Aortic valve peak gradient measures 11.4 mmHg. Aortic valve area, by VTI measures 2.51 cm. There is a 29 mm Edwards Ultra, stented (TAVR) valve present in the aortic  [facility]   MULTIDISCIPLINARY HEART VALVE TEAM  Discharge Summary    Patient ID: Steven Henry MRN: [id number]; DOB: [birth year]  Admit date: 06/25/2023 Discharge date: 06/26/2023  Primary Care Provider: Donita Brooks, MD  Primary Cardiologist: Marjo Bicker, MD / Dr. Excell Seltzer & Dr. Laneta Simmers (TAVR)  Discharge Diagnoses    Principal Problem:   S/P TAVR (transcatheter aortic valve replacement) Active Problems:   Morbid obesity (HCC)   Obstructive sleep apnea   DMII (diabetes mellitus, type 2) (HCC)   Essential hypertension   CKD (chronic kidney disease), stage III (HCC)   Neuropathy   Tobacco use   Chronic diastolic heart failure (HCC)   Hypercoagulable state due to persistent atrial fibrillation (HCC)   Severe aortic stenosis   Pacemaker   Allergies No Known Allergies  Diagnostic Studies/Procedures    TAVR OPERATIVE NOTE     Date of Procedure:                06/25/2023   Preoperative Diagnosis:      Severe Aortic Stenosis    Postoperative Diagnosis:    Same    Procedure:        Transcatheter Aortic Valve Replacement - Percutaneous Transfemoral Approach             Edwards Sapien 3 Ultra Resilia THV (size 29 mm, serial # [id number])              Co-Surgeons:                        Evelene Croon, MD and [name], MD   Anesthesiologist:                  Karna Christmas, MD   Echocardiographer:              Steven Haws, MD   Pre-operative Echo Findings: Severe aortic stenosis Normal left ventricular systolic function   Post-operative Echo Findings: No paravalvular leak Normal/unchanged left ventricular systolic function  _____________    Echo 06/26/23: completed but pending formal read at the time of discharge   History of Present Illness     Steven Henry is a 72 y.o. 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   His most recent echocardiogram on 10/26/2022 showed EF 55% and severe AS with mean grad 34.5 mmHg. [facility] 02/11/23 showed patent coronary arteries, codominant coronary anatomy, with no significant stenosis. He had essentially normal right and left heart diastolic filling pressures and preserved cardiac output and moderately severe aortic stenosis with mean gradient 27 mmHg and calculated valve area 1.16 cm, indexed valve area 0.47. He had poor dentition and underwent multiple extractions.   The patient was evaluated by the multidisciplinary valve team and felt to have severe, symptomatic aortic stenosis and to be a suitable candidate for TAVR, which was set up for 06/25/23.   Hospital Course     Consultants: none   Severe AS: s/p successful TAVR with a 29 mm Edwards Sapien 3 Ultra Resilia THV via the TF approach on 06/25/23. Post operative echo completed but pending formal read. Groin sites are stable. Resumed on home Eliquis 5mg  BID. Walked with cardiac rehab with no issues. Plan

## 2023-06-25 NOTE — Telephone Encounter (Signed)
-----   Message from [name] sent at 06/21/2023  5:08 PM EDT ----- Increase KDur to 20 meq BID, thx

## 2023-06-25 NOTE — Op Note (Signed)
[facility]   MULTIDISCIPLINARY HEART VALVE TEAM   TAVR OPERATIVE NOTE   Date of Procedure:  06/25/2023  Preoperative Diagnosis: Severe Aortic Stenosis   Postoperative Diagnosis: Same   Procedure:   Transcatheter Aortic Valve Replacement - Percutaneous Right Transfemoral Approach  Edwards Sapien 3 Ultra ResiliaTHV (size 29 mm, model # 9755RSL, serial # [id number])   Co-Surgeons:  Alleen Borne, MD and [name], MD   Anesthesiologist:  Hyman Bower, MD  Echocardiographer:  P. Eden Emms, MD  Pre-operative Echo Findings: Severe aortic stenosis Normal left ventricular systolic function  Post-operative Echo Findings: No paravalvular leak Normal left ventricular systolic function   BRIEF CLINICAL NOTE AND INDICATIONS FOR SURGERY  This 72 year old gentleman has stage D, severe, symptomatic aortic stenosis with NYHA class II symptoms of exertional fatigue consistent with chronic diastolic congestive heart failure.  He has had 3 admissions for acute congestive heart failure over the past year.  I have personally reviewed his 2D echocardiogram, cardiac catheterization, and CTA studies.  His echocardiogram shows a calcified and thickened aortic valve with restricted leaflet mobility.  The mean gradient was 34.5 mmHg with a peak velocity of 4.15 m/s.  Cardiac catheterization showed no significant coronary stenosis.  The mean gradient across the aortic valve was measured at 27 mmHg.  I agree that aortic valve replacement is indicated in this patient for relief of his symptoms and to prevent recurrent episodes of acute congestive heart failure and left ventricular deterioration.  Given his age and comorbidities I think that transcatheter aortic valve replacement would be the best option for treating him.  His gated cardiac CTA shows anatomy suitable for TAVR using a 29 mm SAPIEN 3 valve.  His abdominal and pelvic CTA shows adequate pelvic vascular anatomy to allow transfemoral  insertion.   The patient was counseled at length regarding treatment alternatives for management of severe symptomatic aortic stenosis. The risks and benefits of surgical intervention has been discussed in detail. Long-term prognosis with medical therapy was discussed. Alternative approaches such as conventional surgical aortic valve replacement, transcatheter aortic valve replacement, and palliative medical therapy were compared and contrasted at length. This discussion was placed in the context of the patient's own specific clinical presentation and past medical history. All of his questions have been addressed.    Following the decision to proceed with transcatheter aortic valve replacement, a discussion was held regarding what types of management strategies would be attempted intraoperatively in the event of life-threatening complications, including whether or not the patient would be considered a candidate for the use of cardiopulmonary bypass and/or conversion to open sternotomy for attempted surgical intervention.  Given his morbid obesity, ongoing smoking, moderately calcified ascending aorta, resting hypercarbia and hypoxemia on ABG and other comorbidities I do not think he would be a good candidate for emergent sternotomy to manage any intraoperative complications. The patient has been advised of a variety of complications that might develop including but not limited to risks of death, stroke, paravalvular leak, aortic dissection or other major vascular complications, aortic annulus rupture, device embolization, cardiac rupture or perforation, mitral regurgitation, acute myocardial infarction, arrhythmia, heart block or bradycardia requiring permanent pacemaker placement, congestive heart failure, respiratory failure, renal failure, pneumonia, infection, other late complications related to structural valve deterioration or migration, or other complications that might ultimately cause a temporary or  permanent loss of functional independence or other long term morbidity. The patient provides full informed consent for the procedure as described and all questions were answered.  DETAILS OF THE OPERATIVE PROCEDURE  PREPARATION:    The patient was brought to the operating room on the above mentioned date and appropriate monitoring was established by the anesthesia team. The patient was placed in the supine position on the operating table.  Intravenous antibiotics were administered. The patient was monitored closely throughout the procedure under conscious sedation.    Baseline transthoracic echocardiogram was performed. The patient's abdomen and both groins were prepped and draped in a sterile manner. A time out procedure was performed.   PERIPHERAL ACCESS:    Using the modified Seldinger technique, femoral arterial and venous access was obtained with placement of 6 Fr sheaths on the left side.  A pigtail diagnostic catheter was passed through the left arterial sheath under fluoroscopic guidance into the aortic root.  A temporary transvenous pacemaker catheter was passed through the left femoral venous sheath under fluoroscopic guidance into the right ventricle.  The pacemaker was tested to ensure stable lead placement and pacemaker capture. Aortic root angiography was performed in order to determine the optimal angiographic angle for valve deployment.   TRANSFEMORAL ACCESS:   Percutaneous transfemoral access and sheath placement was performed using ultrasound guidance.  The right common femoral artery was cannulated using a micropuncture needle and appropriate location was verified using hand injection angiogram.  A pair of Abbott Perclose percutaneous closure devices were placed and a 6 French sheath replaced into the femoral artery.  The patient was heparinized systemically and ACT verified > 250 seconds.    A 16 Fr transfemoral E-sheath was introduced into the right common femoral  artery after progressively dilating over an Amplatz superstiff wire. An AL-2 catheter was used to direct a straight-tip exchange length wire across the native aortic valve into the left ventricle. This was exchanged out for a pigtail catheter and position was confirmed in the LV apex. Simultaneous LV and Ao pressures were recorded.  The pigtail catheter was exchanged for a Safari wire in the LV apex.   BALLOON AORTIC VALVULOPLASTY:   Not performed  TRANSCATHETER HEART VALVE DEPLOYMENT:   An Edwards Sapien 3 Ultra transcatheter heart valve (size 29 mm) was prepared and crimped per manufacturer's guidelines, and the proper orientation of the valve is confirmed on the [employer] delivery system. The valve was advanced through the introducer sheath using normal technique until in an appropriate position in the abdominal aorta beyond the sheath tip. The balloon was then retracted and using the fine-tuning wheel was centered on the valve. The valve was then advanced across the aortic arch using appropriate flexion of the catheter. The valve was carefully positioned across the aortic valve annulus. The Commander catheter was retracted using normal technique. Once final position of the valve has been confirmed by angiographic assessment, the valve is deployed during rapid ventricular pacing to maintain systolic blood pressure < 50 mmHg and pulse pressure < 10 mmHg. The balloon inflation is held for >3 seconds after reaching full deployment volume. Once the balloon has fully deflated the balloon is retracted into the ascending aorta and valve function is assessed using echocardiography. There is felt to be no paravalvular leak and no central aortic insufficiency.  The patient's hemodynamic recovery following valve deployment is good.  The deployment balloon and guidewire are both removed.    PROCEDURE COMPLETION:   The sheath was removed and femoral artery closure performed.  Protamine was administered  once femoral arterial repair was complete. The temporary pacemaker, pigtail catheter and femoral sheaths were removed with manual  pressure used for venous hemostasis.  A Mynx femoral closure device was utilized following removal of the diagnostic sheath in the left femoral artery.  The patient tolerated the procedure well and is transported to the cath lab recovery area in stable condition. There were no immediate intraoperative complications. All sponge instrument and needle counts are verified correct at completion of the operation.   No blood products were administered during the operation.  The patient received a total of 55 mL of intravenous contrast during the procedure.   Alleen Borne, MD 06/25/2023 3:03 PM

## 2023-06-25 NOTE — Interval H&P Note (Signed)
History and Physical Interval Note:  06/25/2023 1:23 PM  Steven Henry  has presented today for surgery, with the diagnosis of Severe Aortic Stenosis.  The various methods of treatment have been discussed with the patient and family. After consideration of risks, benefits and other options for treatment, the patient has consented to  Procedure(s): Transcatheter Aortic Valve Replacement, Transfemoral (Right) INTRAOPERATIVE TRANSTHORACIC ECHOCARDIOGRAM (N/A) as a surgical intervention.  The patient's history has been reviewed, patient examined, no change in status, stable for surgery.  I have reviewed the patient's chart and labs.  Questions were answered to the patient's satisfaction.     Alleen Borne

## 2023-06-25 NOTE — Transfer of Care (Signed)
Immediate Anesthesia Transfer of Care Note  Patient: Steven Henry  Procedure(s) Performed: Transcatheter Aortic Valve Replacement, Transfemoral (Right) INTRAOPERATIVE TRANSTHORACIC ECHOCARDIOGRAM  Patient Location: PACU and Cath Lab  Anesthesia Type:MAC  Level of Consciousness: awake, patient cooperative, and responds to stimulation  Airway & Oxygen Therapy: Patient Spontanous Breathing and Patient connected to face mask oxygen  Post-op Assessment: Report given to RN and Post -op Vital signs reviewed and stable  Post vital signs: Reviewed and stable  Last Vitals:  Vitals Value Taken Time  BP 113/76 06/25/23 1457  Temp 36.7 C 06/25/23 1457  Pulse 65 06/25/23 1457  Resp 14 06/25/23 1457  SpO2 100 % 06/25/23 1457    Last Pain:  Vitals:   06/25/23 1457  TempSrc: Temporal  PainSc:          Complications: There were no known notable events for this encounter.

## 2023-06-25 NOTE — Anesthesia Postprocedure Evaluation (Signed)
Anesthesia Post Note  Patient: Steven Henry  Procedure(s) Performed: Transcatheter Aortic Valve Replacement, Transfemoral (Right) INTRAOPERATIVE TRANSTHORACIC ECHOCARDIOGRAM     Patient location during evaluation: Cath Lab Anesthesia Type: MAC Level of consciousness: awake Pain management: pain level controlled Vital Signs Assessment: post-procedure vital signs reviewed and stable Respiratory status: spontaneous breathing, nonlabored ventilation and respiratory function stable Cardiovascular status: blood pressure returned to baseline and stable Postop Assessment: no apparent nausea or vomiting Anesthetic complications: no   There were no known notable events for this encounter.  Last Vitals:  Vitals:   06/25/23 1655 06/25/23 1700  BP: (!) 118/51 103/70  Pulse: 67 61  Resp: 19 10  Temp:    SpO2: 97% 96%    Last Pain:  Vitals:   06/25/23 1641  TempSrc: Oral  PainSc: 0-No pain                 Amilah Greenspan P Naudia Crosley

## 2023-06-26 ENCOUNTER — Encounter (HOSPITAL_COMMUNITY): Payer: Self-pay | Admitting: Cardiovascular Disease

## 2023-06-26 ENCOUNTER — Other Ambulatory Visit: Payer: Self-pay | Admitting: Family Medicine

## 2023-06-26 ENCOUNTER — Inpatient Hospital Stay (HOSPITAL_COMMUNITY): Payer: Medicare Other

## 2023-06-26 DIAGNOSIS — I130 Hypertensive heart and chronic kidney disease with heart failure and stage 1 through stage 4 chronic kidney disease, or unspecified chronic kidney disease: Secondary | ICD-10-CM | POA: Diagnosis not present

## 2023-06-26 DIAGNOSIS — I480 Paroxysmal atrial fibrillation: Secondary | ICD-10-CM

## 2023-06-26 DIAGNOSIS — Z006 Encounter for examination for normal comparison and control in clinical research program: Secondary | ICD-10-CM | POA: Diagnosis not present

## 2023-06-26 DIAGNOSIS — Z952 Presence of prosthetic heart valve: Secondary | ICD-10-CM

## 2023-06-26 DIAGNOSIS — D6869 Other thrombophilia: Secondary | ICD-10-CM | POA: Diagnosis not present

## 2023-06-26 DIAGNOSIS — N183 Chronic kidney disease, stage 3 unspecified: Secondary | ICD-10-CM

## 2023-06-26 DIAGNOSIS — I5032 Chronic diastolic (congestive) heart failure: Secondary | ICD-10-CM

## 2023-06-26 DIAGNOSIS — E1100 Type 2 diabetes mellitus with hyperosmolarity without nonketotic hyperglycemic-hyperosmolar coma (NKHHC): Secondary | ICD-10-CM

## 2023-06-26 DIAGNOSIS — I350 Nonrheumatic aortic (valve) stenosis: Secondary | ICD-10-CM | POA: Diagnosis not present

## 2023-06-26 LAB — ECHOCARDIOGRAM COMPLETE
AR max vel: 2.51 cm2
AV Area VTI: 2.51 cm2
AV Area mean vel: 2.54 cm2
AV Mean grad: 8 mm[Hg]
AV Peak grad: 13.1 mm[Hg]
Ao pk vel: 1.81 m/s
Area-P 1/2: 3.42 cm2
Height: 73 in
S' Lateral: 3.8 cm
Weight: 4049.41 [oz_av]

## 2023-06-26 LAB — CBC
HCT: 42.4 % (ref 39.0–52.0)
Hemoglobin: 14.3 g/dL (ref 13.0–17.0)
MCH: 31.9 pg (ref 26.0–34.0)
MCHC: 33.7 g/dL (ref 30.0–36.0)
MCV: 94.6 fL (ref 80.0–100.0)
Platelets: 194 10*3/uL (ref 150–400)
RBC: 4.48 MIL/uL (ref 4.22–5.81)
RDW: 13.2 % (ref 11.5–15.5)
WBC: 9.8 10*3/uL (ref 4.0–10.5)
nRBC: 0 % (ref 0.0–0.2)

## 2023-06-26 LAB — BASIC METABOLIC PANEL
Anion gap: 13 (ref 5–15)
BUN: 10 mg/dL (ref 8–23)
CO2: 25 mmol/L (ref 22–32)
Calcium: 9 mg/dL (ref 8.9–10.3)
Chloride: 100 mmol/L (ref 98–111)
Creatinine, Ser: 0.92 mg/dL (ref 0.61–1.24)
GFR, Estimated: >60 mL/min (ref >60)
Glucose, Bld: 160 mg/dL — ABNORMAL HIGH (ref 70–99)
Potassium: 3.6 mmol/L (ref 3.5–5.1)
Sodium: 138 mmol/L (ref 135–145)

## 2023-06-26 LAB — MAGNESIUM: Magnesium: 2 mg/dL (ref 1.7–2.4)

## 2023-06-26 LAB — GLUCOSE, CAPILLARY: Glucose-Capillary: 212 mg/dL — ABNORMAL HIGH (ref 70–99)

## 2023-06-26 NOTE — Progress Notes (Signed)
Explained discharge instructions to patient. Reviewed follow up appointment and next medication administration times. Also reviewed education. Patient verbalized having an understanding for instructions given. All belongings are in the patient's possession.  IV and telemetry were removed by patient's RN. CCMD was notified. No other needs verbalized. Transporting downstairs to the discharge lounge to await ride.

## 2023-06-26 NOTE — Progress Notes (Addendum)
CARDIAC REHAB PHASE I   PRE:  Rate/Rhythm: 75 NSR  BP:  Sitting: 108/81      SaO2: 94 RA  MODE:  Ambulation: 470 ft   AD:   no AD  POST:  Rate/Rhythm: 96 RA  BP:  Sitting: 117/75      SaO2: 94 RA  Pt amb with supervision assist, walked well denies chest pain and dyspnea  Pt educated on exercise, HH diet, restrictions, and CR. Pt declined CRP2  Faustino Congress  ACSM-CEP 10:43 AM 06/26/2023

## 2023-06-26 NOTE — Progress Notes (Signed)
Pt is alert and fully oriented x4, stable hemodynamically, afebrile, V-paced on the monitor, on CPAP at night, no acute distress noted. Pain is well tolerated. Right and left groins are negative for bleeding or hematoma. 2+ palpable PT and DP bilaterally. We will continue to monitor.   Filiberto Pinks, RN

## 2023-06-26 NOTE — Plan of Care (Signed)
  Problem: Education: Goal: Understanding of CV disease, CV risk reduction, and recovery process will improve Outcome: Adequate for Discharge Goal: Individualized Educational Video(s) Outcome: Adequate for Discharge   Problem: Activity: Goal: Ability to return to baseline activity level will improve Outcome: Adequate for Discharge   Problem: Cardiovascular: Goal: Ability to achieve and maintain adequate cardiovascular perfusion will improve Outcome: Adequate for Discharge Goal: Vascular access site(s) Level 0-1 will be maintained Outcome: Adequate for Discharge   Problem: Health Behavior/Discharge Planning: Goal: Ability to safely manage health-related needs after discharge will improve Outcome: Adequate for Discharge   Problem: Education: Goal: Knowledge of cardiac device and self-care will improve Outcome: Adequate for Discharge Goal: Ability to safely manage health related needs after discharge will improve Outcome: Adequate for Discharge Goal: Individualized Educational Video(s) Outcome: Adequate for Discharge   Problem: Cardiac: Goal: Ability to achieve and maintain adequate cardiopulmonary perfusion will improve Outcome: Adequate for Discharge

## 2023-06-26 NOTE — Telephone Encounter (Signed)
Requested Prescriptions  Pending Prescriptions Disp Refills   empagliflozin (JARDIANCE) 25 MG TABS tablet [Pharmacy Med Name: Jardiance 25 mg tablet] 90 tablet 0    Sig: TAKE 1 TABLET BY MOUTH DAILY BEFORE breakfast     Endocrinology:  Diabetes - SGLT2 Inhibitors Failed - 06/26/2023  8:02 AM      Failed - HBA1C is between 0 and 7.9 and within 180 days    Hgb A1c MFr Bld  Date Value Ref Range Status  12/07/2022 8.4 (H) <5.7 % of total Hgb Final    Comment:    For someone without known diabetes, a hemoglobin A1c value of 6.5% or greater indicates that they may have  diabetes and this should be confirmed with a follow-up  test. . For someone with known diabetes, a value <7% indicates  that their diabetes is well controlled and a value  greater than or equal to 7% indicates suboptimal  control. A1c targets should be individualized based on  duration of diabetes, age, comorbid conditions, and  other considerations. . Currently, no consensus exists regarding use of hemoglobin A1c for diagnosis of diabetes for children. .          Failed - Valid encounter within last 6 months    Recent Outpatient Visits           1 year ago Acute pain of left knee   [facility] Pickard, Priscille Heidelberg, MD   1 year ago Rib pain on left side   [facility] Pickard, Priscille Heidelberg, MD   1 year ago Rib pain on left side   [facility] Valentino Nose, NP   1 year ago Uncontrolled type 2 diabetes mellitus with hyperosmolarity without coma, without long-term current use of insulin (HCC)   [facility] Donita Brooks, MD   2 years ago Acute pulmonary edema (HCC)   [facility] Donita Brooks, MD       Future Appointments             In 1 week [place] [facility], [facility]    In 4 weeks  [facility] at [facility], [facility]             Passed - Cr in normal range and within 360  days    Creat  Date Value Ref Range Status  05/15/2023 0.93 0.70 - 1.28 mg/dL Final   Creatinine, Ser  Date Value Ref Range Status  06/26/2023 0.92 0.61 - 1.24 mg/dL Final   Creatinine, Urine  Date Value Ref Range Status  08/22/2022 37 20 - 320 mg/dL Final         Passed - eGFR in normal range and within 360 days    GFR, Est African American  Date Value Ref Range Status  08/01/2020 101 > OR = 60 mL/min/1.70m2 Final   GFR, Est Non African American  Date Value Ref Range Status  08/01/2020 87 > OR = 60 mL/min/1.56m2 Final   GFR, Estimated  Date Value Ref Range Status  06/26/2023 >60 >60 mL/min Final    Comment:    (NOTE) Calculated using the CKD-EPI Creatinine Equation (2021)    eGFR  Date Value Ref Range Status  12/07/2022 88 > OR = 60 mL/min/1.57m2 Final          apixaban (ELIQUIS) 5 MG TABS tablet [Pharmacy Med Name: Eliquis 5 mg tablet] 180 tablet 0    Sig: TAKE 1  TABLET BY MOUTH TWICE DAILY     Hematology:  Anticoagulants - apixaban Failed - 06/26/2023  8:02 AM      Failed - Valid encounter within last 12 months    Recent Outpatient Visits           1 year ago Acute pain of left knee   [facility] Donita Brooks, MD   1 year ago Rib pain on left side   [facility] Tanya Nones, Priscille Heidelberg, MD   1 year ago Rib pain on left side   [facility] Valentino Nose, NP   1 year ago Uncontrolled type 2 diabetes mellitus with hyperosmolarity without coma, without long-term current use of insulin (HCC)   [facility] Donita Brooks, MD   2 years ago Acute pulmonary edema (HCC)   [facility] Pickard, Priscille Heidelberg, MD       Future Appointments             In 1 week [place] [facility], [facility]    In 4 weeks [place] [facility], [facility]             Passed - PLT in normal range and within 360 days    Platelets  Date Value Ref Range  Status  06/26/2023 194 150 - 400 K/uL Final         Passed - HGB in normal range and within 360 days    Hemoglobin  Date Value Ref Range Status  06/26/2023 14.3 13.0 - 17.0 g/dL Final         Passed - HCT in normal range and within 360 days    HCT  Date Value Ref Range Status  06/26/2023 42.4 39.0 - 52.0 % Final         Passed - Cr in normal range and within 360 days    Creat  Date Value Ref Range Status  05/15/2023 0.93 0.70 - 1.28 mg/dL Final   Creatinine, Ser  Date Value Ref Range Status  06/26/2023 0.92 0.61 - 1.24 mg/dL Final   Creatinine, Urine  Date Value Ref Range Status  08/22/2022 37 20 - 320 mg/dL Final         Passed - AST in normal range and within 360 days    AST  Date Value Ref Range Status  06/21/2023 22 15 - 41 U/L Final         Passed - ALT in normal range and within 360 days    ALT  Date Value Ref Range Status  06/21/2023 17 0 - 44 U/L Final          Semaglutide, 2 MG/DOSE, (OZEMPIC, 2 MG/DOSE,) 8 MG/3ML SOPN [Pharmacy Med Name: Ozempic 2 mg/dose (8 mg/3 mL) subcutaneous pen injector] 3 mL 0    Sig: INJECT 2 MG UNDER THE SKIN WEEKLY     Endocrinology:  Diabetes - GLP-1 Receptor Agonists - semaglutide Failed - 06/26/2023  8:02 AM      Failed - HBA1C in normal range and within 180 days    Hgb A1c MFr Bld  Date Value Ref Range Status  12/07/2022 8.4 (H) <5.7 % of total Hgb Final    Comment:    For someone without known diabetes, a hemoglobin A1c value of 6.5% or greater indicates that they may have  diabetes and this should be confirmed with a follow-up  test. . For someone with known  diabetes, a value <7% indicates  that their diabetes is well controlled and a value  greater than or equal to 7% indicates suboptimal  control. A1c targets should be individualized based on  duration of diabetes, age, comorbid conditions, and  other considerations. . Currently, no consensus exists regarding use of hemoglobin A1c for diagnosis of diabetes  for children. .          Failed - Valid encounter within last 6 months    Recent Outpatient Visits           1 year ago Acute pain of left knee   [facility] Pickard, Priscille Heidelberg, MD   1 year ago Rib pain on left side   [facility] Pickard, Priscille Heidelberg, MD   1 year ago Rib pain on left side   [facility] Valentino Nose, NP   1 year ago Uncontrolled type 2 diabetes mellitus with hyperosmolarity without coma, without long-term current use of insulin (HCC)   [facility] Donita Brooks, MD   2 years ago Acute pulmonary edema (HCC)   [facility] Pickard, Priscille Heidelberg, MD       Future Appointments             In 1 week [place] [facility], [facility]    In 4 weeks [place] [facility], [facility]             Passed - Cr in normal range and within 360 days    Creat  Date Value Ref Range Status  05/15/2023 0.93 0.70 - 1.28 mg/dL Final   Creatinine, Ser  Date Value Ref Range Status  06/26/2023 0.92 0.61 - 1.24 mg/dL Final   Creatinine, Urine  Date Value Ref Range Status  08/22/2022 37 20 - 320 mg/dL Final

## 2023-06-26 NOTE — TOC Transition Note (Signed)
Transition of Care (TOC) - CM/SW Discharge Note Donn Pierini RN, BSN Transitions of Care Unit 4E- RN Case Manager See Treatment Team for direct phone #    Patient Details  Name: Steven Henry MRN: [id number] Date of Birth: 04-Dec-1950  Transition of Care [facility]) CM/SW Contact:  Darrold Span, RN Phone Number: 06/26/2023, 12:20 PM   Clinical Narrative:    Pt s/p TAVR and stable for transition home today, No TOC needs noted for [facility] or DME.  Family to transport home.    Final next level of care: Home/Self Care Barriers to Discharge: No Barriers Identified   Patient Goals and CMS Choice   Choice offered to / list presented to : NA  Discharge Placement                 Home        Discharge Plan and Services Additional resources added to the After Visit Summary for   In-house Referral: NA Discharge Planning Services: NA Post Acute Care Choice: NA          DME Arranged: N/A DME Agency: NA       HH Arranged: NA HH Agency: NA        Social Determinants of Health (SDOH) Interventions SDOH Screenings   Food Insecurity: No Food Insecurity (11/16/2022)  Housing: Low Risk  (10/26/2022)  Transportation Needs: No Transportation Needs (10/26/2022)  Utilities: Not At Risk (10/26/2022)  Alcohol Screen: Low Risk  (09/06/2022)  Depression (PHQ2-9): Low Risk  (12/07/2022)  Financial Resource Strain: Low Risk  (11/16/2022)  Physical Activity: Sufficiently Active (09/06/2022)  Social Connections: Socially Isolated (09/06/2022)  Stress: No Stress Concern Present (09/06/2022)  Tobacco Use: High Risk (06/25/2023)     Readmission Risk Interventions    06/26/2023   12:20 PM  Readmission Risk Prevention Plan  Post Dischage Appt Complete  Medication Screening Complete  Transportation Screening Complete

## 2023-06-27 ENCOUNTER — Telehealth: Payer: Self-pay | Admitting: Cardiology

## 2023-06-27 NOTE — Telephone Encounter (Signed)
  [facility]   MULTIDISCIPLINARY HEART VALVE TEAM   Attempted to contact the patient for [facility] call today with no answer. LVM to return call.   Georgie Chard NP-C Structural Heart Team  Pager: [phone]

## 2023-06-28 NOTE — Telephone Encounter (Signed)
Patient contacted regarding discharge from [facility] on 06/26/2023.  Patient understands to follow up with provider Carlean Jews PA-C on 07/05/2023 at 11:05 AM at [facility] locations. Patient understands discharge instructions? yes Patient understands medications and regiment? yes Patient understands to bring all medications to this visit? Yes  Steven Henry is doing well since discharge and has no questions or concerns at this time.

## 2023-07-03 DIAGNOSIS — J9601 Acute respiratory failure with hypoxia: Secondary | ICD-10-CM | POA: Diagnosis not present

## 2023-07-04 NOTE — Progress Notes (Signed)
[facility]ASC[facility]ENTER   MULTIDISCIPLINARY HEART VALVE CLINIC                                     Cardiology Office Note:    Date:  07/05/2023   ID:  Steven Henry, DOB 06/12/1951, MRN [id number]  PCP:  Donita Brooks, MD  [facility] Cardiologist:  Marjo Bicker, MD  / Dr. Excell Seltzer & Dr. Laneta Simmers (TAVR)  [facility] Electrophysiologist:  None   Referring MD: Donita Brooks, MD   [facility] s/p TAVR  History of Present Illness:    Steven Henry is a 72 y.o. 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   His most recent echocardiogram on 10/26/2022 showed EF 55% and severe AS with mean grad 34.5 mmHg. [facility] 02/11/23 showed patent coronary arteries, codominant coronary anatomy, with no significant stenosis. He had essentially normal right and left heart diastolic filling pressures and preserved cardiac output and moderately severe aortic stenosis with mean gradient 27 mmHg and calculated valve area 1.16 cm, indexed valve area 0.47. He had poor dentition and underwent multiple extractions.   He was evaluated by the multidisciplinary valve team and underwent successful TAVR with a 29 mm Edwards Sapien 3 Ultra Resilia THV via the TF approach on 06/25/23. Post operative echo showed EF 60%, normally functioning TAVR with a mean gradient of 8 mmHg and no PVL. He had hypokalemia and Kdur increased. Resumed on home Eliquis 5mg  BID.   Today the patient presents to clinic for follow up. No CP or SOB. No LE edema, orthopnea or PND. No dizziness or syncope. No blood in stool or urine. No palpitations. Still smoking but cut back a lot. 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 Excell Seltzer and Dr. Laneta Simmers   Severe aortic stenosis    Sleep apnea    not on CPAP     Past Surgical History:  Procedure Laterality Date   CARDIOVERSION N/A 11/05/2022   Procedure: CARDIOVERSION;  Surgeon: Maisie Fus, MD;  Location: [facility] ENDOSCOPY;  Service: Cardiovascular;  Laterality: N/A;   CARDIOVERSION N/A 01/09/2023   Procedure: CARDIOVERSION;  Surgeon: Jake Bathe, MD;  Location: [facility] INVASIVE CV LAB;  Service: Cardiovascular;  Laterality: N/A;   COLONOSCOPY N/A 09/16/2015   Procedure: COLONOSCOPY;  Surgeon: Corbin Ade, MD;  Location: [facility] ENDO SUITE;  Service: Endoscopy;  Laterality: N/A;  8:15 AM - pt has c pap   ENDOVENOUS ABLATION SAPHENOUS VEIN W/ LASER Right 08/26/2017   endovenous laser ablation right greater saphenous vein by Josephina Gip MD  INTRAOPERATIVE TRANSTHORACIC ECHOCARDIOGRAM N/A 06/25/2023   Procedure: INTRAOPERATIVE TRANSTHORACIC ECHOCARDIOGRAM;  Surgeon: [name], MD;  Location: [facility] INVASIVE CV LAB;  Service: Open Heart Surgery;  Laterality: N/A;   PACEMAKER IMPLANT N/A 09/14/2022   Procedure: PACEMAKER IMPLANT;  Surgeon: Duke Salvia, MD;  Location: [facility] INVASIVE CV LAB;  Service: Cardiovascular;  Laterality: N/A;   RIGHT/LEFT HEART CATH AND CORONARY ANGIOGRAPHY N/A 02/11/2023   Procedure: RIGHT/LEFT HEART CATH AND CORONARY ANGIOGRAPHY;  Surgeon: [name], MD;  Location: [facility] INVASIVE CV LAB;  Service: Cardiovascular;  Laterality: N/A;   stab phlebectomy  Right 01/07/2018   stab phlebectomy 10-20 incisions right leg by Josephina Gip MD    TRANSCATHETER AORTIC VALVE  REPLACEMENT, TRANSFEMORAL Right 06/25/2023   Procedure: Transcatheter Aortic Valve Replacement, Transfemoral;  Surgeon: [name], MD;  Location: [facility] INVASIVE CV LAB;  Service: Open Heart Surgery;  Laterality: Right;    Current Medications: Current Meds  Medication Sig   Accu-Chek FastClix Lancets MISC Check BS QAM - DX: E11.9   albuterol (VENTOLIN HFA) 108 (90 Base) MCG/ACT inhaler Inhale 2 puffs into the lungs every 6 (six) hours as needed for wheezing or shortness of breath.   apixaban (ELIQUIS) 5 MG TABS tablet TAKE 1 TABLET BY MOUTH TWICE DAILY   atorvastatin (LIPITOR) 40 MG tablet TAKE 1 TABLET BY MOUTH DAILY (Patient taking differently: Take 40 mg by mouth at bedtime.)   bisoprolol (ZEBETA) 5 MG tablet Take 1 tablet (5 mg total) by mouth daily. (Patient taking differently: No sig reported)   Blood Glucose Monitoring Suppl (ONE TOUCH ULTRA 2) w/Device KIT USETO CHECK BLOOD SUGAR EVERY MORNING   budesonide-formoterol (SYMBICORT) 160-4.5 MCG/ACT inhaler Inhale 2 puffs into the lungs in the morning and at bedtime. (Patient taking differently: Inhale 2 puffs into the lungs 2 (two) times daily as needed (shortness of breath).)   Cholecalciferol (VITAMIN D-3) 125 MCG (5000 UT) TABS Take 5,000 Units by mouth 2 (two) times daily.   Continuous Blood Gluc Receiver (FREESTYLE LIBRE 2 READER) DEVI For continuous blood glucose monitoring   Continuous Glucose Sensor (FREESTYLE LIBRE 2 SENSOR) MISC APPLY 1 SENSOR ON THE SKIN EVERY 14 DAYS, USE AS DIRECTED TO CHECK BLOOD SUGAR   empagliflozin (JARDIANCE) 25 MG TABS tablet TAKE 1 TABLET BY MOUTH DAILY BEFORE breakfast   flecainide (TAMBOCOR) 100 MG tablet Take 1 tablet (100 mg total) by mouth 2 (two) times daily.   furosemide (LASIX) 40 MG tablet Take 2 tablets (80 mg total) by mouth 2 (two) times daily.   insulin glargine (LANTUS) 100 UNIT/ML injection Inject 0.25 mLs (25 Units total) into the skin daily. PLEASE INCREASE INSULIN BY 1 UNIT DAILY UNTIL  FBG IS <130, THEN HOLD AT THAT DOSE   ipratropium-albuterol (DUONEB) 0.5-2.5 (3) MG/3ML SOLN Take 3 mLs by nebulization 3 (three) times daily. (Patient taking differently: Take 3 mLs by nebulization every 4 (four) hours as needed (shortness of breath).)   Lancets Misc. (ACCU-CHEK FASTCLIX LANCET) KIT Check BS QAM - DX: E11.9   losartan (COZAAR) 50 MG tablet Take 1 tablet (50 mg total) by mouth daily.   metFORMIN (GLUCOPHAGE) 1000 MG tablet TAKE 1 TABLET BY MOUTH TWICE DAILY WITH a meal   Multiple Vitamin (MULTIVITAMIN) capsule Take 1 capsule by mouth in the morning. Mens one a day   ONETOUCH ULTRA test strip USE TO CHECK BLOOD SUGAR EVERY MORNING   potassium chloride SA (KLOR-CON M20) 20 MEQ tablet Take 1 tablet (20 mEq total) by mouth 2 (two) times  daily.   Semaglutide, 2 MG/DOSE, (OZEMPIC, 2 MG/DOSE,) 8 MG/3ML SOPN INJECT 2 MG UNDER THE SKIN WEEKLY   spironolactone (ALDACTONE) 25 MG tablet Take 1 tablet (25 mg total) by mouth daily.   Zinc 30 MG CAPS Take 30 mg by mouth daily.      ROS:   Please see the history of present illness.    All other systems reviewed and are negative.  EKGs   EKG:  EKG shows AV paced rhythm with HR 84 and prolonged AV conduction  Recent Labs: 10/26/2022: TSH 0.902 11/01/2022: Brain Natriuretic Peptide 42 06/21/2023: ALT 17 06/26/2023: BUN 10; Creatinine, Ser 0.92; Hemoglobin 14.3; Magnesium 2.0; Platelets 194; Potassium 3.6; Sodium 138  Recent Lipid Panel    Component Value Date/Time   CHOL 146 12/07/2022 0814   TRIG 77 12/07/2022 0814   HDL 59 12/07/2022 0814   CHOLHDL 2.5 12/07/2022 0814   VLDL 8 02/27/2022 0424   LDLCALC 71 12/07/2022 0814     Risk Assessment/Calculations:    CHA2DS2-VASc Score = 5   This indicates a 7.2% annual risk of stroke. The patient's score is based upon: CHF History: 1 HTN History: 1 Diabetes History: 1 Stroke History: 0 Vascular Disease History: 1 Age Score: 1 Gender Score: 0           Physical Exam:     VS:  BP 104/64 (BP Location: Right Arm, Patient Position: Sitting, Cuff Size: Normal)   Pulse 84   Ht 6\' 1"  (1.854 m)   Wt 256 lb 3.2 oz (116.2 kg)   SpO2 91%   BMI 33.80 kg/m     Wt Readings from Last 3 Encounters:  07/05/23 256 lb 3.2 oz (116.2 kg)  06/26/23 253 lb 1.4 oz (114.8 kg)  05/24/23 261 lb 3.2 oz (118.5 kg)     GEN: Well nourished, well developed in no acute distress NECK: No JVD; No carotid bruits CARDIAC: RRR, no murmurs, rubs, gallops RESPIRATORY:  Clear to auscultation without rales, wheezing or rhonchi  ABDOMEN: Soft, non-tender, non-distended EXTREMITIES:  No edema; No deformity.  Groin sites clear without hematoma or ecchymosis    ASSESSMENT:    1. S/P TAVR (transcatheter aortic valve replacement)   2. Chronic heart failure with preserved ejection fraction (HCC)   3. Hypokalemia   4. Persistent atrial fibrillation (HCC)   5. Chronic obstructive pulmonary disease, unspecified COPD type (HCC)   6. Pacemaker   7. Abnormal CT scan of lung    PLAN:    In order of problems listed above:  Severe AS s/p TAVR: doing well 1 week out from TAVR. ECG with paced rhythm. Groin sites healing well. SBE prophylaxis discussed; the patient is edentulous and does not go to the dentist. Continue on Eliquis 5mg  BID. I will see him back for 1 month visit with echo.    Chronic HFpEF: appears euvolemic today. Continue Jardiance 10mg  daily, losartan 50mg  daily, sprio 25mg  daily and lasix 80mg  BID/Kdur BID. (Will change to 40mg  daily).    Hypokalemia: Kdur increased from 20mg  daily to BID. (Will change to 40mg  daily). Check BMET today   Persistent atrial fibrillation: s/p two cardioversions this year, 2/24 and 4/24. Continue flecainide 100mg  BID and bisoprolol 40mg  daily as well as Eliquis 5mg  BID. Followed by Dr. Graciela Husbands    COPD with ongoing smoking: has cut back a lot and wants to quit soon. Used to smoke 2.5 PPD and now down to 6 a day.    HTN: BP well  controlled. No  changes made.    S/p PPM: followed by Dr. Graciela Husbands   Abnormal lung CT: pre TAVR CT showed "mild subpleural reticular opacities, findings can be seen in setting of interstitial lung disease. Correlate with patient's systems and consider pulmonary function tests and/or ILD protocol CT." Pt follows with Dr Sherene Sires and recent PFT did not suggest significant ILD      Medication Adjustments/Labs and Tests Ordered: Current medicines are reviewed at length with the patient today.  Concerns regarding medicines are outlined above.  Orders Placed This Encounter  Procedures   Basic metabolic panel   EKG 12-Lead   No orders of the defined types were placed in this encounter.   Patient Instructions  Medication Instructions:  Continue taking Potassium 20 Meq twice a day  *If you need a refill on your cardiac medications before your next appointment, please call your pharmacy*   Lab Work: BMET- Today   If you have labs (blood work) drawn today and your tests are completely normal, you will receive your results only by: MyChart Message (if you have MyChart) OR A paper copy in the mail If you have any lab test that is abnormal or we need to change your treatment, we will call you to review the results.   Testing/Procedures: None ordered    Follow-Up: Follow up as scheduled   Other Instructions     Signed, Cline Crock, PA-C  07/05/2023 11:48 AM    [facility]

## 2023-07-04 NOTE — Progress Notes (Signed)
Remote pacemaker transmission.   

## 2023-07-05 ENCOUNTER — Ambulatory Visit: Payer: Medicare Other | Attending: Cardiology | Admitting: Physician Assistant

## 2023-07-05 VITALS — BP 104/64 | HR 84 | Ht 73.0 in | Wt 256.2 lb

## 2023-07-05 DIAGNOSIS — R918 Other nonspecific abnormal finding of lung field: Secondary | ICD-10-CM | POA: Diagnosis not present

## 2023-07-05 DIAGNOSIS — I5032 Chronic diastolic (congestive) heart failure: Secondary | ICD-10-CM

## 2023-07-05 DIAGNOSIS — E876 Hypokalemia: Secondary | ICD-10-CM | POA: Diagnosis not present

## 2023-07-05 DIAGNOSIS — Z952 Presence of prosthetic heart valve: Secondary | ICD-10-CM | POA: Diagnosis not present

## 2023-07-05 DIAGNOSIS — J449 Chronic obstructive pulmonary disease, unspecified: Secondary | ICD-10-CM | POA: Diagnosis not present

## 2023-07-05 DIAGNOSIS — I4819 Other persistent atrial fibrillation: Secondary | ICD-10-CM | POA: Diagnosis not present

## 2023-07-05 DIAGNOSIS — Z950 Presence of cardiac pacemaker: Secondary | ICD-10-CM | POA: Diagnosis not present

## 2023-07-05 NOTE — Patient Instructions (Signed)
Medication Instructions:  Continue taking Potassium 20 Meq twice a day  *If you need a refill on your cardiac medications before your next appointment, please call your pharmacy*   Lab Work: BMET- Today   If you have labs (blood work) drawn today and your tests are completely normal, you will receive your results only by: MyChart Message (if you have MyChart) OR A paper copy in the mail If you have any lab test that is abnormal or we need to change your treatment, we will call you to review the results.   Testing/Procedures: None ordered    Follow-Up: Follow up as scheduled   Other Instructions

## 2023-07-06 LAB — BASIC METABOLIC PANEL
BUN/Creatinine Ratio: 12 (ref 10–24)
BUN: 11 mg/dL (ref 8–27)
CO2: 28 mmol/L (ref 20–29)
Calcium: 10 mg/dL (ref 8.6–10.2)
Chloride: 97 mmol/L (ref 96–106)
Creatinine, Ser: 0.92 mg/dL (ref 0.76–1.27)
Glucose: 128 mg/dL — ABNORMAL HIGH (ref 70–99)
Potassium: 3.5 mmol/L (ref 3.5–5.2)
Sodium: 142 mmol/L (ref 134–144)
eGFR: 88 mL/min/{1.73_m2} (ref >59)

## 2023-07-22 NOTE — Progress Notes (Unsigned)
HEART AND VASCULAR CENTER   MULTIDISCIPLINARY HEART VALVE CLINIC                                     Cardiology Office Note:    Date:  07/25/2023   ID:  Steven Henry, DOB 06/03/1951, MRN [id number]  PCP:  Donita Brooks, MD  [facility] Cardiologist:  Marjo Bicker, MD  / Dr. Excell Seltzer & Dr. Laneta Simmers (TAVR)  [facility] Electrophysiologist:  None   Referring MD: Donita Brooks, MD   1 month s/p TAVR  History of Present Illness:    Steven Henry is a 72 y.o. 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   His most recent echocardiogram on 10/26/2022 showed EF 55% and severe AS with mean grad 34.5 mmHg. [facility] 02/11/23 showed patent coronary arteries, codominant coronary anatomy, with no significant stenosis. He had essentially normal right and left heart diastolic filling pressures and preserved cardiac output and moderately severe aortic stenosis with mean gradient 27 mmHg and calculated valve area 1.16 cm, indexed valve area 0.47. He had poor dentition and underwent multiple extractions.   He was evaluated by the multidisciplinary valve team and underwent successful TAVR with a 29 mm Edwards Sapien 3 Ultra Resilia THV via the TF approach on 06/25/23. Post operative echo showed EF 60%, normally functioning TAVR with a mean gradient of 8 mmHg and no PVL. He had hypokalemia and Kdur increased. Resumed on home Eliquis 5mg  BID.   Today the patient presents to clinic for follow up. Just got back from the [employer]. 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 He finds that he can go for much longer and do more since his valve replacement. No CP or SOB. No LE edema, orthopnea or PND. No dizziness or syncope. No blood in stool or urine. No palpitations.    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 Excell Seltzer and Dr. Laneta Simmers   Severe aortic stenosis    Sleep apnea    not on CPAP     Past Surgical History:  Procedure Laterality Date   CARDIOVERSION N/A 11/05/2022   Procedure: CARDIOVERSION;  Surgeon: Maisie Fus, MD;  Location[facility] ENDOSCOPY;  Service: Cardiovascular;  Laterality: N/A;   CARDIOVERSION N/A 01/09/2023   Procedure: CARDIOVERSION;  Surgeon: Jake Bathe, MD;  Location: [facility] INVASIVE CV LAB;  Service: Cardiovascular;  Laterality: N/A;   COLONOSCOPY N/A 09/16/2015   Procedure: COLONOSCOPY;  Surgeon: Corbin Ade, MD;  Location: [facility] ENDO SUITE;  Service: Endoscopy;  Laterality: N/A;  8:15 AM - pt has c pap   ENDOVENOUS ABLATION SAPHENOUS VEIN W/ LASER Right 08/26/2017   endovenous  laser ablation right greater saphenous vein by Josephina Gip MD    INTRAOPERATIVE TRANSTHORACIC ECHOCARDIOGRAM N/A 06/25/2023   Procedure: INTRAOPERATIVE TRANSTHORACIC ECHOCARDIOGRAM;  Surgeon: [name], MD;  Location: [facility] INVASIVE CV LAB;  Service: Open Heart Surgery;  Laterality: N/A;   PACEMAKER IMPLANT N/A 09/14/2022   Procedure: PACEMAKER IMPLANT;  Surgeon: Duke Salvia, MD;  Location: [facility] INVASIVE CV LAB;  Service: Cardiovascular;  Laterality: N/A;   RIGHT/LEFT HEART CATH AND CORONARY ANGIOGRAPHY N/A 02/11/2023   Procedure: RIGHT/LEFT HEART CATH AND CORONARY ANGIOGRAPHY;  Surgeon: [name], MD;  Location: [facility] INVASIVE CV LAB;  Service: Cardiovascular;  Laterality: N/A;   stab phlebectomy  Right 01/07/2018   stab phlebectomy 10-20  incisions right leg by Josephina Gip MD    TRANSCATHETER AORTIC VALVE REPLACEMENT, TRANSFEMORAL Right 06/25/2023   Procedure: Transcatheter Aortic Valve Replacement, Transfemoral;  Surgeon: [name], MD;  Location: [facility] INVASIVE CV LAB;  Service: Open Heart Surgery;  Laterality: Right;    Current Medications: Current Meds  Medication Sig   Accu-Chek FastClix Lancets MISC Check BS QAM - DX: E11.9   albuterol (VENTOLIN HFA) 108 (90 Base) MCG/ACT inhaler Inhale 2 puffs into the lungs every 6 (six) hours as needed for wheezing or shortness of breath.   apixaban (ELIQUIS) 5 MG TABS tablet TAKE 1 TABLET BY MOUTH TWICE DAILY   atorvastatin (LIPITOR) 40 MG tablet TAKE 1 TABLET BY MOUTH DAILY (Patient taking differently: Take 40 mg by mouth at bedtime.)   bisoprolol (ZEBETA) 5 MG tablet Take 1 tablet (5 mg total) by mouth daily. (Patient taking differently: No sig reported)   Blood Glucose Monitoring Suppl (ONE TOUCH ULTRA 2) w/Device KIT USETO CHECK BLOOD SUGAR EVERY MORNING   budesonide-formoterol (SYMBICORT) 160-4.5 MCG/ACT inhaler Inhale 2 puffs into the lungs in the morning and at bedtime. (Patient taking differently: Inhale 2 puffs into the lungs 2 (two) times daily as needed (shortness of breath).)   Cholecalciferol (VITAMIN D-3) 125 MCG (5000 UT) TABS Take 5,000 Units by mouth 2 (two) times daily.   Continuous Blood Gluc Receiver (FREESTYLE LIBRE 2 READER) DEVI For continuous blood glucose monitoring   Continuous Glucose Sensor (FREESTYLE LIBRE 2 SENSOR) MISC APPLY 1 SENSOR ON THE SKIN EVERY 14 DAYS, USE AS DIRECTED TO CHECK BLOOD SUGAR   empagliflozin (JARDIANCE) 25 MG TABS tablet TAKE 1 TABLET BY MOUTH DAILY BEFORE breakfast   flecainide (TAMBOCOR) 100 MG tablet Take 1 tablet (100 mg total) by mouth 2 (two) times daily.   furosemide (LASIX) 40 MG tablet Take 2 tablets (80 mg total) by mouth 2 (two) times daily.   insulin glargine (LANTUS) 100 UNIT/ML injection Inject 0.25 mLs (25 Units  total) into the skin daily. PLEASE INCREASE INSULIN BY 1 UNIT DAILY UNTIL FBG IS <130, THEN HOLD AT THAT DOSE   ipratropium-albuterol (DUONEB) 0.5-2.5 (3) MG/3ML SOLN Take 3 mLs by nebulization 3 (three) times daily. (Patient taking differently: Take 3 mLs by nebulization every 4 (four) hours as needed (shortness of breath).)   Lancets Misc. (ACCU-CHEK FASTCLIX LANCET) KIT Check BS QAM - DX: E11.9   losartan (COZAAR) 50 MG tablet Take 1 tablet (50 mg total) by mouth daily.   metFORMIN (GLUCOPHAGE) 1000 MG tablet TAKE 1 TABLET BY MOUTH TWICE DAILY WITH a meal   Multiple Vitamin (MULTIVITAMIN) capsule Take 1 capsule by mouth in the morning. Mens one a day   ONETOUCH ULTRA test strip USE TO CHECK BLOOD SUGAR EVERY MORNING   potassium chloride SA (KLOR-CON M20) 20  MEQ tablet Take 1 tablet (20 mEq total) by mouth 2 (two) times daily.   Semaglutide, 2 MG/DOSE, (OZEMPIC, 2 MG/DOSE,) 8 MG/3ML SOPN INJECT 2 MG UNDER THE SKIN WEEKLY   spironolactone (ALDACTONE) 25 MG tablet Take 1 tablet (25 mg total) by mouth daily.   Zinc 30 MG CAPS Take 30 mg by mouth daily.      ROS:   Please see the history of present illness.    All other systems reviewed and are negative.  EKGs   EKG:  EKG is NOT ordered  Recent Labs: 10/26/2022: TSH 0.902 11/01/2022: Brain Natriuretic Peptide 42 06/21/2023: ALT 17 06/26/2023: Hemoglobin 14.3; Magnesium 2.0; Platelets 194 07/05/2023: BUN 11; Creatinine, Ser 0.92; Potassium 3.5; Sodium 142  Recent Lipid Panel    Component Value Date/Time   CHOL 146 12/07/2022 0814   TRIG 77 12/07/2022 0814   HDL 59 12/07/2022 0814   CHOLHDL 2.5 12/07/2022 0814   VLDL 8 02/27/2022 0424   LDLCALC 71 12/07/2022 0814     Risk Assessment/Calculations:    CHA2DS2-VASc Score = 5   This indicates a 7.2% annual risk of stroke. The patient's score is based upon: CHF History: 1 HTN History: 1 Diabetes History: 1 Stroke History: 0 Vascular Disease History: 1 Age Score: 1 Gender Score:  0           Physical Exam:    VS:  BP 102/62   Pulse 85   Ht 6\' 1"  (1.854 m)   Wt 246 lb 12.8 oz (111.9 kg)   SpO2 94%   BMI 32.56 kg/m     Wt Readings from Last 3 Encounters:  07/24/23 246 lb 12.8 oz (111.9 kg)  07/05/23 256 lb 3.2 oz (116.2 kg)  06/26/23 253 lb 1.4 oz (114.8 kg)     GEN: Well nourished, well developed in no acute distress NECK: No JVD;  CARDIAC: RRR, no murmurs, rubs, gallops RESPIRATORY:  Clear to auscultation without rales, wheezing or rhonchi  ABDOMEN: Soft, non-tender, non-distended EXTREMITIES:  No edema; No deformity.     ASSESSMENT:    1. S/P TAVR (transcatheter aortic valve replacement)   2. Chronic heart failure with preserved ejection fraction (HCC)   3. Hypokalemia   4. Persistent atrial fibrillation (HCC)   5. Chronic obstructive pulmonary disease, unspecified COPD type (HCC)   6. Essential hypertension   7. Pacemaker   8. Abnormal CT scan of lung     PLAN:    In order of problems listed above:  Severe AS s/p TAVR: echo today shows EF 60%, normally functioning TAVR with a mean gradient of 12 mm hg and no PVL. He has NYHA class I symptoms. SBE prophylaxis discussed; the patient is edentulous and does not go to the dentist. Continue on Eliquis 5mg  BID. I will see him back for 1 year visit with echo.    Chronic HFpEF: appears euvolemic today. Continue Jardiance 10mg  daily, losartan 50mg  daily, sprio 25mg  daily and lasix 80mg  BID/Kdur daily.     Hypokalemia: continue Kdur 40mg  daily. Recent labs showed K 3.5.   Persistent atrial fibrillation: s/p two cardioversions this year, 2/24 and 4/24. Continue flecainide 100mg  BID and bisoprolol 40mg  daily as well as Eliquis 5mg  BID. Followed by Dr. Graciela Husbands    COPD with ongoing smoking: has cut back a lot and wants to quit soon. Used to smoke 2.5 PPD and now down to 6 a day.    HTN: BP well controlled. No changes made.  S/p PPM: followed by Dr. Graciela Husbands   Abnormal lung CT: pre TAVR CT  showed "mild subpleural reticular opacities, findings can be seen in setting of interstitial lung disease. Correlate with patient's systems and consider pulmonary function tests and/or ILD protocol CT." Pt follows with Dr Sherene Sires and recent PFT did not suggest significant ILD     Medication Adjustments/Labs and Tests Ordered: Current medicines are reviewed at length with the patient today.  Concerns regarding medicines are outlined above.  Orders Placed This Encounter  Procedures   ECHOCARDIOGRAM COMPLETE   No orders of the defined types were placed in this encounter.   Patient Instructions  Medication Instructions:  Your physician recommends that you continue on your current medications as directed. Please refer to the Current Medication list given to you today.  *If you need a refill on your cardiac medications before your next appointment, please call your pharmacy*   Lab Work: None ordered   If you have labs (blood work) drawn today and your tests are completely normal, you will receive your results only by: MyChart Message (if you have MyChart) OR A paper copy in the mail If you have any lab test that is abnormal or we need to change your treatment, we will call you to review the results.   Testing/Procedures: None ordered    Follow-Up: Follow up as scheduled   Other Instructions     Signed, Cline Crock, PA-C  07/25/2023 8:54 AM    [facility]

## 2023-07-24 ENCOUNTER — Ambulatory Visit: Payer: Medicare Other | Admitting: Physician Assistant

## 2023-07-24 ENCOUNTER — Ambulatory Visit
Admission: RE | Admit: 2023-07-24 | Discharge: 2023-07-24 | Disposition: A | Discharge status: Home / Self Care | Payer: Medicare Other | Source: Ambulatory Visit | Attending: Cardiology | Admitting: Cardiology

## 2023-07-24 VITALS — BP 102/62 | HR 85 | Ht 73.0 in | Wt 246.8 lb

## 2023-07-24 DIAGNOSIS — E876 Hypokalemia: Secondary | ICD-10-CM | POA: Insufficient documentation

## 2023-07-24 DIAGNOSIS — J449 Chronic obstructive pulmonary disease, unspecified: Secondary | ICD-10-CM | POA: Insufficient documentation

## 2023-07-24 DIAGNOSIS — I5032 Chronic diastolic (congestive) heart failure: Secondary | ICD-10-CM

## 2023-07-24 DIAGNOSIS — Z950 Presence of cardiac pacemaker: Secondary | ICD-10-CM | POA: Insufficient documentation

## 2023-07-24 DIAGNOSIS — Z952 Presence of prosthetic heart valve: Secondary | ICD-10-CM

## 2023-07-24 DIAGNOSIS — I4819 Other persistent atrial fibrillation: Secondary | ICD-10-CM

## 2023-07-24 DIAGNOSIS — R918 Other nonspecific abnormal finding of lung field: Secondary | ICD-10-CM | POA: Insufficient documentation

## 2023-07-24 DIAGNOSIS — I10 Essential (primary) hypertension: Secondary | ICD-10-CM | POA: Insufficient documentation

## 2023-07-24 LAB — ECHOCARDIOGRAM COMPLETE
AR max vel: 2.54 cm2
AV Area VTI: 2.36 cm2
AV Area mean vel: 2.36 cm2
AV Mean grad: 11.5 mm[Hg]
AV Peak grad: 21.1 mm[Hg]
Ao pk vel: 2.3 m/s
Area-P 1/2: 2.73 cm2
Height: 73 in
S' Lateral: 2.8 cm
Weight: 3948.8 [oz_av]

## 2023-07-24 NOTE — Patient Instructions (Signed)
Medication Instructions:  Your physician recommends that you continue on your current medications as directed. Please refer to the Current Medication list given to you today.  *If you need a refill on your cardiac medications before your next appointment, please call your pharmacy*   Lab Work: None ordered   If you have labs (blood work) drawn today and your tests are completely normal, you will receive your results only by: [place] (if you have MyChart) OR A paper copy in the mail If you have any lab test that is abnormal or we need to change your treatment, we will call you to review the results.   Testing/Procedures: None ordered    Follow-Up: Follow up as scheduled   Other Instructions

## 2023-07-26 ENCOUNTER — Other Ambulatory Visit: Payer: Self-pay | Admitting: Family Medicine

## 2023-07-26 DIAGNOSIS — E1100 Type 2 diabetes mellitus with hyperosmolarity without nonketotic hyperglycemic-hyperosmolar coma (NKHHC): Secondary | ICD-10-CM

## 2023-07-26 DIAGNOSIS — I5032 Chronic diastolic (congestive) heart failure: Secondary | ICD-10-CM

## 2023-07-26 NOTE — Telephone Encounter (Signed)
Requested Prescriptions  Pending Prescriptions Disp Refills   Semaglutide, 2 MG/DOSE, (OZEMPIC, 2 MG/DOSE,) 8 MG/3ML SOPN [Pharmacy Med Name: Ozempic 2 mg/dose (8 mg/3 mL) subcutaneous pen injector] 3 mL 0    Sig: INJECT 2 MG UNDER THE SKIN WEEKLY     Endocrinology:  Diabetes - GLP-1 Receptor Agonists - semaglutide Failed - 07/26/2023  8:04 AM      Failed - HBA1C in normal range and within 180 days    Hgb A1c MFr Bld  Date Value Ref Range Status  12/07/2022 8.4 (H) <5.7 % of total Hgb Final    Comment:    For someone without known diabetes, a hemoglobin A1c value of 6.5% or greater indicates that they may have  diabetes and this should be confirmed with a follow-up  test. . For someone with known diabetes, a value <7% indicates  that their diabetes is well controlled and a value  greater than or equal to 7% indicates suboptimal  control. A1c targets should be individualized based on  duration of diabetes, age, comorbid conditions, and  other considerations. . Currently, no consensus exists regarding use of hemoglobin A1c for diagnosis of diabetes for children. .          Failed - Valid encounter within last 6 months    Recent Outpatient Visits           1 year ago Acute pain of left knee   [facility] Pickard, Priscille Heidelberg, MD   1 year ago Rib pain on left side   [facility] Pickard, Priscille Heidelberg, MD   1 year ago Rib pain on left side   [facility] Valentino Nose, NP   1 year ago Uncontrolled type 2 diabetes mellitus with hyperosmolarity without coma, without long-term current use of insulin (HCC)   [facility] Donita Brooks, MD   2 years ago Acute pulmonary edema (HCC)   [facility] Pickard, Priscille Heidelberg, MD       Future Appointments             In 2 months Mallipeddi, Orion Modest, MD [facility] at [place], [place]   In 10 months  [facility],  [facility]            Passed - Cr in normal range and within 360 days    Creat  Date Value Ref Range Status  05/15/2023 0.93 0.70 - 1.28 mg/dL Final   Creatinine, Ser  Date Value Ref Range Status  07/05/2023 0.92 0.76 - 1.27 mg/dL Final   Creatinine, Urine  Date Value Ref Range Status  08/22/2022 37 20 - 320 mg/dL Final

## 2023-07-27 ENCOUNTER — Other Ambulatory Visit: Payer: Self-pay | Admitting: Family Medicine

## 2023-07-27 DIAGNOSIS — E1100 Type 2 diabetes mellitus with hyperosmolarity without nonketotic hyperglycemic-hyperosmolar coma (NKHHC): Secondary | ICD-10-CM

## 2023-07-27 DIAGNOSIS — E7800 Pure hypercholesterolemia, unspecified: Secondary | ICD-10-CM

## 2023-07-29 ENCOUNTER — Other Ambulatory Visit: Payer: Self-pay

## 2023-07-29 DIAGNOSIS — E1100 Type 2 diabetes mellitus with hyperosmolarity without nonketotic hyperglycemic-hyperosmolar coma (NKHHC): Secondary | ICD-10-CM

## 2023-07-29 MED ORDER — BISOPROLOL FUMARATE 5 MG PO TABS: 5.0000 mg | ORAL_TABLET | Freq: Every day | ORAL | 3 refills | Status: DC

## 2023-07-29 NOTE — Telephone Encounter (Signed)
Requested Prescriptions  Pending Prescriptions Disp Refills   metFORMIN (GLUCOPHAGE) 1000 MG tablet [Pharmacy Med Name: metformin 1,000 mg tablet] 180 tablet     Sig: TAKE 1 TABLET BY MOUTH TWICE DAILY WITH a meal     Endocrinology:  Diabetes - Biguanides Failed - 07/27/2023  8:04 AM      Failed - HBA1C is between 0 and 7.9 and within 180 days    Hgb A1c MFr Bld  Date Value Ref Range Status  12/07/2022 8.4 (H) <5.7 % of total Hgb Final    Comment:    For someone without known diabetes, a hemoglobin A1c value of 6.5% or greater indicates that they may have  diabetes and this should be confirmed with a follow-up  test. . For someone with known diabetes, a value <7% indicates  that their diabetes is well controlled and a value  greater than or equal to 7% indicates suboptimal  control. A1c targets should be individualized based on  duration of diabetes, age, comorbid conditions, and  other considerations. . Currently, no consensus exists regarding use of hemoglobin A1c for diagnosis of diabetes for children. .          Failed - Valid encounter within last 6 months    Recent Outpatient Visits           1 year ago Acute pain of left knee   [facility] Pickard, Priscille Heidelberg, MD   1 year ago Rib pain on left side   [facility] Pickard, Priscille Heidelberg, MD   1 year ago Rib pain on left side   [facility] Valentino Nose, NP   1 year ago Uncontrolled type 2 diabetes mellitus with hyperosmolarity without coma, without long-term current use of insulin (HCC)   [facility] Donita Brooks, MD   2 years ago Acute pulmonary edema (HCC)   [facility] Donita Brooks, MD       Future Appointments             In 2 months Mallipeddi, Orion Modest, MD [facility] [place], [place]   In 10 months  [facility] [employer], [facility]            Passed - Cr in normal range and  within 360 days    Creat  Date Value Ref Range Status  05/15/2023 0.93 0.70 - 1.28 mg/dL Final   Creatinine, Ser  Date Value Ref Range Status  07/05/2023 0.92 0.76 - 1.27 mg/dL Final   Creatinine, Urine  Date Value Ref Range Status  08/22/2022 37 20 - 320 mg/dL Final         Passed - eGFR in normal range and within 360 days    GFR, Est African American  Date Value Ref Range Status  08/01/2020 101 > OR = 60 mL/min/1.59m2 Final   GFR, Est Non African American  Date Value Ref Range Status  08/01/2020 87 > OR = 60 mL/min/1.42m2 Final   GFR, Estimated  Date Value Ref Range Status  06/26/2023 >60 >60 mL/min Final    Comment:    (NOTE) Calculated using the CKD-EPI Creatinine Equation (2021)    eGFR  Date Value Ref Range Status  07/05/2023 88 >59 mL/min/1.73 Final         Passed - B12 Level in normal range and within 720 days    Vitamin B-12  Date Value Ref Range Status  02/26/2022  689 180 - 914 pg/mL Final    Comment:    (NOTE) This assay is not validated for testing neonatal or myeloproliferative syndrome specimens for Vitamin B12 levels. Performed at [facility], [address], [place], [place] [address]          Passed - CBC within normal limits and completed in the last 12 months    WBC  Date Value Ref Range Status  06/26/2023 9.8 4.0 - 10.5 K/uL Final   RBC  Date Value Ref Range Status  06/26/2023 4.48 4.22 - 5.81 MIL/uL Final   Hemoglobin  Date Value Ref Range Status  06/26/2023 14.3 13.0 - 17.0 g/dL Final   HCT  Date Value Ref Range Status  06/26/2023 42.4 39.0 - 52.0 % Final   MCHC  Date Value Ref Range Status  06/26/2023 33.7 30.0 - 36.0 g/dL Final   [facility]  Date Value Ref Range Status  06/26/2023 31.9 26.0 - 34.0 pg Final   MCV  Date Value Ref Range Status  06/26/2023 94.6 80.0 - 100.0 fL Final   No results found for: "PLTCOUNTKUC", "LABPLAT", "POCPLA" RDW  Date Value Ref Range Status  06/26/2023 13.2 11.5 - 15.5 % Final           atorvastatin (LIPITOR) 40 MG tablet [Pharmacy Med Name: atorvastatin 40 mg tablet] 90 tablet 1    Sig: TAKE 1 TABLET BY MOUTH DAILY     Cardiovascular:  Antilipid - Statins Failed - 07/27/2023  8:04 AM      Failed - Valid encounter within last 12 months    Recent Outpatient Visits           1 year ago Acute pain of left knee   [facility] Donita Brooks, MD   1 year ago Rib pain on left side   [facility] Tanya Nones, Priscille Heidelberg, MD   1 year ago Rib pain on left side   [facility] Valentino Nose, NP   1 year ago Uncontrolled type 2 diabetes mellitus with hyperosmolarity without coma, without long-term current use of insulin (HCC)   [facility] Donita Brooks, MD   2 years ago Acute pulmonary edema (HCC)   [facility] Pickard, Priscille Heidelberg, MD       Future Appointments             In 2 months Mallipeddi, Orion Modest, MD [facility] [place], [place]   In 10 months  [facility], [facility]            Failed - Lipid Panel in normal range within the last 12 months    Cholesterol  Date Value Ref Range Status  12/07/2022 146 <200 mg/dL Final   LDL Cholesterol (Calc)  Date Value Ref Range Status  12/07/2022 71 mg/dL (calc) Final    Comment:    Reference range: <100 . Desirable range <100 mg/dL for primary prevention;   <70 mg/dL for patients with CHD or diabetic patients  with > or = 2 CHD risk factors. Marland Kitchen LDL-C is now calculated using the Martin-Hopkins  calculation, which is a validated novel method providing  better accuracy than the Friedewald equation in the  estimation of LDL-C.  Horald Pollen et al. Lenox Ahr. 6045;409(81): 2061-2068  (http://education.[employer].com/faq/FAQ164)    HDL  Date Value Ref Range Status  12/07/2022 59 > OR = 40 mg/dL Final   Triglycerides  Date Value Ref  Range Status  12/07/2022 77 <150 mg/dL Final          Passed - Patient is not pregnant       glipiZIDE (GLUCOTROL) 10 MG tablet [Pharmacy Med Name: glipizide 10 mg tablet] 90 tablet     Sig: TAKE 1 TABLET BY MOUTH EVERY DAY BEFORE breakfast     Endocrinology:  Diabetes - Sulfonylureas Failed - 07/27/2023  8:04 AM      Failed - HBA1C is between 0 and 7.9 and within 180 days    Hgb A1c MFr Bld  Date Value Ref Range Status  12/07/2022 8.4 (H) <5.7 % of total Hgb Final    Comment:    For someone without known diabetes, a hemoglobin A1c value of 6.5% or greater indicates that they may have  diabetes and this should be confirmed with a follow-up  test. . For someone with known diabetes, a value <7% indicates  that their diabetes is well controlled and a value  greater than or equal to 7% indicates suboptimal  control. A1c targets should be individualized based on  duration of diabetes, age, comorbid conditions, and  other considerations. . Currently, no consensus exists regarding use of hemoglobin A1c for diagnosis of diabetes for children. .          Failed - Valid encounter within last 6 months    Recent Outpatient Visits           1 year ago Acute pain of left knee   [facility] Pickard, Priscille Heidelberg, MD   1 year ago Rib pain on left side   [facility] Pickard, Priscille Heidelberg, MD   1 year ago Rib pain on left side   [facility] Valentino Nose, NP   1 year ago Uncontrolled type 2 diabetes mellitus with hyperosmolarity without coma, without long-term current use of insulin (HCC)   [facility] Donita Brooks, MD   2 years ago Acute pulmonary edema (HCC)   [facility] Pickard, Priscille Heidelberg, MD       Future Appointments             In 2 months Mallipeddi, Orion Modest, MD [facility] at [place], [place]   In 10 months  [facility], [facility]            Passed - Cr in normal range and within 360 days     Creat  Date Value Ref Range Status  05/15/2023 0.93 0.70 - 1.28 mg/dL Final   Creatinine, Ser  Date Value Ref Range Status  07/05/2023 0.92 0.76 - 1.27 mg/dL Final   Creatinine, Urine  Date Value Ref Range Status  08/22/2022 37 20 - 320 mg/dL Final

## 2023-07-29 NOTE — Telephone Encounter (Signed)
Requested medication (s) are due for refill today: yes  Requested medication (s) are on the active medication list: yes  Last refill:  08/13/22 #180 3 RF  Future visit scheduled: no  Notes to clinic:  last appt 12/07/22 Hgb A1C out of range- was supposed to have an endo. referral   Requested Prescriptions  Pending Prescriptions Disp Refills   metFORMIN (GLUCOPHAGE) 1000 MG tablet [Pharmacy Med Name: metformin 1,000 mg tablet] 180 tablet     Sig: TAKE 1 TABLET BY MOUTH TWICE DAILY WITH a meal     Endocrinology:  Diabetes - Biguanides Failed - 07/27/2023  8:04 AM      Failed - HBA1C is between 0 and 7.9 and within 180 days    Hgb A1c MFr Bld  Date Value Ref Range Status  12/07/2022 8.4 (H) <5.7 % of total Hgb Final    Comment:    For someone without known diabetes, a hemoglobin A1c value of 6.5% or greater indicates that they may have  diabetes and this should be confirmed with a follow-up  test. . For someone with known diabetes, a value <7% indicates  that their diabetes is well controlled and a value  greater than or equal to 7% indicates suboptimal  control. A1c targets should be individualized based on  duration of diabetes, age, comorbid conditions, and  other considerations. . Currently, no consensus exists regarding use of hemoglobin A1c for diagnosis of diabetes for children. .          Failed - Valid encounter within last 6 months    Recent Outpatient Visits           1 year ago Acute pain of left knee   [facility] Pickard, Priscille Heidelberg, MD   1 year ago Rib pain on left side   [facility] Pickard, Priscille Heidelberg, MD   1 year ago Rib pain on left side   [facility] Valentino Nose, NP   1 year ago Uncontrolled type 2 diabetes mellitus with hyperosmolarity without coma, without long-term current use of insulin (HCC)   [facility] Donita Brooks, MD   2 years ago Acute pulmonary edema (HCC)    [facility] Pickard, Priscille Heidelberg, MD       Future Appointments             In 2 months Mallipeddi, Orion Modest, MD [facility] [place], [place]   In 10 months  [facility] at [employer], [facility]            Passed - Cr in normal range and within 360 days    Creat  Date Value Ref Range Status  05/15/2023 0.93 0.70 - 1.28 mg/dL Final   Creatinine, Ser  Date Value Ref Range Status  07/05/2023 0.92 0.76 - 1.27 mg/dL Final   Creatinine, Urine  Date Value Ref Range Status  08/22/2022 37 20 - 320 mg/dL Final         Passed - eGFR in normal range and within 360 days    GFR, Est African American  Date Value Ref Range Status  08/01/2020 101 > OR = 60 mL/min/1.76m2 Final   GFR, Est Non African American  Date Value Ref Range Status  08/01/2020 87 > OR = 60 mL/min/1.64m2 Final   GFR, Estimated  Date Value Ref Range Status  06/26/2023 >60 >60 mL/min Final    Comment:    (NOTE)  Calculated using the CKD-EPI Creatinine Equation (2021)    eGFR  Date Value Ref Range Status  07/05/2023 88 >59 mL/min/1.73 Final         Passed - B12 Level in normal range and within 720 days    Vitamin B-12  Date Value Ref Range Status  02/26/2022 689 180 - 914 pg/mL Final    Comment:    (NOTE) This assay is not validated for testing neonatal or myeloproliferative syndrome specimens for Vitamin B12 levels. Performed at [facility], [address] [place], [place] [address]          Passed - CBC within normal limits and completed in the last 12 months    WBC  Date Value Ref Range Status  06/26/2023 9.8 4.0 - 10.5 K/uL Final   RBC  Date Value Ref Range Status  06/26/2023 4.48 4.22 - 5.81 MIL/uL Final   Hemoglobin  Date Value Ref Range Status  06/26/2023 14.3 13.0 - 17.0 g/dL Final   HCT  Date Value Ref Range Status  06/26/2023 42.4 39.0 - 52.0 % Final   MCHC  Date Value Ref Range Status  06/26/2023 33.7 30.0 - 36.0 g/dL Final   [facility]   Date Value Ref Range Status  06/26/2023 31.9 26.0 - 34.0 pg Final   MCV  Date Value Ref Range Status  06/26/2023 94.6 80.0 - 100.0 fL Final   No results found for: "PLTCOUNTKUC", "LABPLAT", "POCPLA" RDW  Date Value Ref Range Status  06/26/2023 13.2 11.5 - 15.5 % Final         Signed Prescriptions Disp Refills   atorvastatin (LIPITOR) 40 MG tablet 90 tablet 1    Sig: TAKE 1 TABLET BY MOUTH DAILY     Cardiovascular:  Antilipid - Statins Failed - 07/27/2023  8:04 AM      Failed - Valid encounter within last 12 months    Recent Outpatient Visits           1 year ago Acute pain of left knee   [facility] Donita Brooks, MD   1 year ago Rib pain on left side   [facility] Tanya Nones, Priscille Heidelberg, MD   1 year ago Rib pain on left side   [facility] Valentino Nose, NP   1 year ago Uncontrolled type 2 diabetes mellitus with hyperosmolarity without coma, without long-term current use of insulin (HCC)   [facility] Donita Brooks, MD   2 years ago Acute pulmonary edema (HCC)   [facility] Pickard, Priscille Heidelberg, MD       Future Appointments             In 2 months Mallipeddi, Orion Modest, MD [facility] [place], [place]   In 10 months  [facility], [facility]            Failed - Lipid Panel in normal range within the last 12 months    Cholesterol  Date Value Ref Range Status  12/07/2022 146 <200 mg/dL Final   LDL Cholesterol (Calc)  Date Value Ref Range Status  12/07/2022 71 mg/dL (calc) Final    Comment:    Reference range: <100 . Desirable range <100 mg/dL for primary prevention;   <70 mg/dL for patients with CHD or diabetic patients  with > or = 2 CHD risk factors. Marland Kitchen LDL-C is now calculated using the Martin-Hopkins  calculation, which is a  validated novel method providing  better accuracy than the Friedewald equation in the   estimation of LDL-C.  Horald Pollen et al. Lenox Ahr. 4540;981(19): 2061-2068  (http://education.[facility].com/faq/FAQ164)    HDL  Date Value Ref Range Status  12/07/2022 59 > OR = 40 mg/dL Final   Triglycerides  Date Value Ref Range Status  12/07/2022 77 <150 mg/dL Final         Passed - Patient is not pregnant      Refused Prescriptions Disp Refills   glipiZIDE (GLUCOTROL) 10 MG tablet [Pharmacy Med Name: glipizide 10 mg tablet] 90 tablet     Sig: TAKE 1 TABLET BY MOUTH EVERY DAY BEFORE breakfast     Endocrinology:  Diabetes - Sulfonylureas Failed - 07/27/2023  8:04 AM      Failed - HBA1C is between 0 and 7.9 and within 180 days    Hgb A1c MFr Bld  Date Value Ref Range Status  12/07/2022 8.4 (H) <5.7 % of total Hgb Final    Comment:    For someone without known diabetes, a hemoglobin A1c value of 6.5% or greater indicates that they may have  diabetes and this should be confirmed with a follow-up  test. . For someone with known diabetes, a value <7% indicates  that their diabetes is well controlled and a value  greater than or equal to 7% indicates suboptimal  control. A1c targets should be individualized based on  duration of diabetes, age, comorbid conditions, and  other considerations. . Currently, no consensus exists regarding use of hemoglobin A1c for diagnosis of diabetes for children. .          Failed - Valid encounter within last 6 months    Recent Outpatient Visits           1 year ago Acute pain of left knee   [facility] Pickard, Priscille Heidelberg, MD   1 year ago Rib pain on left side   [facility] Pickard, Priscille Heidelberg, MD   1 year ago Rib pain on left side   [facility] Valentino Nose, NP   1 year ago Uncontrolled type 2 diabetes mellitus with hyperosmolarity without coma, without long-term current use of insulin (HCC)   [facility] Donita Brooks, MD   2 years ago Acute pulmonary  edema (HCC)   [facility] Pickard, Priscille Heidelberg, MD       Future Appointments             In 2 months Mallipeddi, Orion Modest, MD [facility] [place], [place]   In 10 months  [facility], [facility]            Passed - Cr in normal range and within 360 days    Creat  Date Value Ref Range Status  05/15/2023 0.93 0.70 - 1.28 mg/dL Final   Creatinine, Ser  Date Value Ref Range Status  07/05/2023 0.92 0.76 - 1.27 mg/dL Final   Creatinine, Urine  Date Value Ref Range Status  08/22/2022 37 20 - 320 mg/dL Final

## 2023-08-06 ENCOUNTER — Encounter: Payer: Self-pay | Admitting: Gastroenterology

## 2023-08-25 ENCOUNTER — Other Ambulatory Visit: Payer: Self-pay | Admitting: Family Medicine

## 2023-08-25 DIAGNOSIS — E1100 Type 2 diabetes mellitus with hyperosmolarity without nonketotic hyperglycemic-hyperosmolar coma (NKHHC): Secondary | ICD-10-CM

## 2023-08-25 DIAGNOSIS — I5032 Chronic diastolic (congestive) heart failure: Secondary | ICD-10-CM

## 2023-08-28 NOTE — Telephone Encounter (Signed)
Requested medication (s) are due for refill today: Yes  Requested medication (s) are on the active medication list: Yes  Last refill:  07/26/23  Future visit scheduled: No  Notes to clinic:  Protocol indicates OV needed.    Requested Prescriptions  Pending Prescriptions Disp Refills   OZEMPIC, 2 MG/DOSE, 8 MG/3ML SOPN [Pharmacy Med Name: Ozempic 2 mg/dose (8 mg/3 mL) subcutaneous pen injector] 3 mL 0    Sig: INJECT 2 MG UNDER THE SKIN WEEKLY     Endocrinology:  Diabetes - GLP-1 Receptor Agonists - semaglutide Failed - 08/25/2023  8:02 AM      Failed - HBA1C in normal range and within 180 days    Hgb A1c MFr Bld  Date Value Ref Range Status  12/07/2022 8.4 (H) <5.7 % of total Hgb Final    Comment:    For someone without known diabetes, a hemoglobin A1c value of 6.5% or greater indicates that they may have  diabetes and this should be confirmed with a follow-up  test. . For someone with known diabetes, a value <7% indicates  that their diabetes is well controlled and a value  greater than or equal to 7% indicates suboptimal  control. A1c targets should be individualized based on  duration of diabetes, age, comorbid conditions, and  other considerations. . Currently, no consensus exists regarding use of hemoglobin A1c for diagnosis of diabetes for children. .          Failed - Valid encounter within last 6 months    Recent Outpatient Visits           1 year ago Acute pain of left knee   [facility] Pickard, Priscille Heidelberg, MD   1 year ago Rib pain on left side   [facility] Pickard, Priscille Heidelberg, MD   1 year ago Rib pain on left side   [facility] Valentino Nose, NP   1 year ago Uncontrolled type 2 diabetes mellitus with hyperosmolarity without coma, without long-term current use of insulin (HCC)   [facility] Donita Brooks, MD   2 years ago Acute pulmonary edema (HCC)   [facility]  Pickard, Priscille Heidelberg, MD       Future Appointments             In 2 days Sherene Sires Charlaine Dalton, MD [facility]   In 1 month Mallipeddi, Orion Modest, MD [facility] [place], [place]   In 9 months  [facility] [employer], [facility]            Passed - Cr in normal range and within 360 days    Creat  Date Value Ref Range Status  05/15/2023 0.93 0.70 - 1.28 mg/dL Final   Creatinine, Ser  Date Value Ref Range Status  07/05/2023 0.92 0.76 - 1.27 mg/dL Final   Creatinine, Urine  Date Value Ref Range Status  08/22/2022 37 20 - 320 mg/dL Final

## 2023-08-29 NOTE — Progress Notes (Signed)
Subjective:    Patient ID: Steven Henry, male    DOB: Oct 04, 1950  MRN: [id number]  HPI  57 yowm active smoker   referred by Dr Nadara Eaton for eval of sob and cough  03/03/2013 1st pulmonary eval cc doe x several years x indolent onset to point where problem walking across a parking lot better p rx with "a pill" by Dr Charleston Poot and now more limited by calf pain than sob but now bothered by cough on ACEi since 12/2012 indolent x 2 weeks, dry, day > night rec Stop lisinopril (zestril, prinivil) due to cough  Start micardis 80/12.5  One half daily and let your doctor know this when you get your blood work called in The key is to stop smoking completely before smoking completely stops you - it's not too late!   04/20/2013 f/u ov/Steven Henry re GOLD 0 COPD Chief Complaint  Patient presents with   Follow-up with PFT    Pt states that he feels breathing has improved some since his last visit. Cough has resolved.    doe much better,  walking mall is good, hills are a problem or if he gets in a real hurry. Rec Continue micardis 80 /12.5 one half daily but call if your insurance prefers a different medication in the same category (ARB)    11/29/2022  re establish  ov/[place] office/Steven Henry re: GOLD 0 copd/ ? Cardiac asthma maint on sybmicot 160 2 bid  s/p cardioversion 3 weeks prior to OV  seemed to really help doe  Chief Complaint  Patient presents with   Consult    Consult COPD from cardiology  Dyspnea:  Not limited by breathing from desired activities   Cough: minimal white  Sleeping: flat bed 2 pillow  SABA use: ? On symbicort  02: 4lpm hs with cpap  Covid status: never vax / once infected  Lung cancer screening: declined for now  Rec Continue symbicort but drop the dose to 2 puff each am  - if doing worse resume 2 twice daily  Please schedule a follow up visit in 3 months but call sooner if needed - bring inhalers  LHC  02/11/23  1.  Patent coronary arteries, codominant coronary anatomy, with no  significant stenosis 2.  Essentially normal right and left heart diastolic filling pressures and preserved cardiac output 3.  Moderately severe aortic stenosis with mean gradient 27 mmHg and calculated valve area 1.16 cm, indexed valve area 0.47   02/28/2023  f/u ov/[place] office/Steven Henry re: GOLD 0 COPD/? Cardiac asthma  maint on symbicort 160  did not bring meds as requested  Chief Complaint  Patient presents with   Follow-up    Breathing has improved since the last visit. He had PFT's in May 2024. He has a prod cough in the am with milky to clear sputum.   Dyspnea:  no regular ex / stay active slt slower pace than others = MMRC2 = can't walk a nl pace on a flat grade s sob but does fine slow and flat  Cough: each am couple of good coughs clears it min vol mucoid Sleeping: level bed / cpap and 4lpm  SABA use: rarely  02: just hs  Rec Symbicort 160 can be used up to 2 pffs every 12 hours if needed  If start needing albuterol, you need to be back on the  symbicort 160 2 every 12 h Only use the nebulizer if albuterol puffer 1st and it doesn't work  The key is to stop  smoking completely before smoking completely stops you! Make sure you check your oxygen saturation at your highest level of activity(NOT after you stop)  to be sure it stays over 90%      Please schedule a follow up office visit in 6 months , call sooner if needed with all  respiratory medications /inhalers solutions in hand      08/30/2023  f/u ov/[place] office/Steven Henry re: GOLD 0 COPD /AB possible Cardiac asthma  maint on symbicort 160  did not  bring meds Chief Complaint  Patient presents with   Follow-up    6 month follow up COPD    Dyspnea:  walking more x 15 min s stopping flat surface / can do [employer] x 30 min  Cough: minimal in am only   Sleeping: sleep and 4lpm CPAP s resp cc  SABA use: rarely  (< 1 per week) 02: 4lpm hs only with cpap   Lung cancer screening: due  12/2023 defer to PCP    No obvious day to day  or daytime variability or assoc excess/ purulent sputum or mucus plugs or hemoptysis or cp or chest tightness, subjective wheeze or overt sinus or hb symptoms.    Also denies any obvious fluctuation of symptoms with weather or environmental changes or other aggravating or alleviating factors except as outlined above   No unusual exposure hx or h/o childhood pna/ asthma or knowledge of premature birth.  Current Allergies, Complete Past Medical History, Past Surgical History, Family History, and Social History were reviewed in [employer] record.  ROS  The following are not active complaints unless bolded Hoarseness, sore throat, dysphagia, dental problems, itching, sneezing,  nasal congestion or discharge of excess mucus or purulent secretions, ear ache,   fever, chills, sweats, unintended wt loss or wt gain, classically pleuritic or exertional cp,  orthopnea pnd or arm/hand swelling  or leg swelling, presyncope, palpitations, abdominal pain, anorexia, nausea, vomiting, diarrhea  or change in bowel habits or change in bladder habits, change in stools or change in urine, dysuria, hematuria,  rash, arthralgias, visual complaints, headache, numbness, weakness or ataxia or problems with walking or coordination,  change in mood or  memory.        Current Meds  Medication Sig   Accu-Chek FastClix Lancets MISC Check BS QAM - DX: E11.9   albuterol (VENTOLIN HFA) 108 (90 Base) MCG/ACT inhaler Inhale 2 puffs into the lungs every 6 (six) hours as needed for wheezing or shortness of breath.   apixaban (ELIQUIS) 5 MG TABS tablet TAKE 1 TABLET BY MOUTH TWICE DAILY   atorvastatin (LIPITOR) 40 MG tablet TAKE 1 TABLET BY MOUTH DAILY   bisoprolol (ZEBETA) 5 MG tablet Take 1 tablet (5 mg total) by mouth daily.   Blood Glucose Monitoring Suppl (ONE TOUCH ULTRA 2) w/Device KIT USETO CHECK BLOOD SUGAR EVERY MORNING   budesonide-formoterol (SYMBICORT) 160-4.5 MCG/ACT inhaler Inhale 2 puffs into  the lungs in the morning and at bedtime. (Patient taking differently: Inhale 2 puffs into the lungs 2 (two) times daily as needed (shortness of breath).)   Cholecalciferol (VITAMIN D-3) 125 MCG (5000 UT) TABS Take 5,000 Units by mouth 2 (two) times daily.   Continuous Blood Gluc Receiver (FREESTYLE LIBRE 2 READER) DEVI For continuous blood glucose monitoring   Continuous Glucose Sensor (FREESTYLE LIBRE 2 SENSOR) MISC APPLY 1 SENSOR ON THE SKIN EVERY 14 DAYS, USE AS DIRECTED TO CHECK BLOOD SUGAR   empagliflozin (JARDIANCE) 25 MG TABS tablet TAKE 1  TABLET BY MOUTH DAILY BEFORE breakfast   flecainide (TAMBOCOR) 100 MG tablet Take 1 tablet (100 mg total) by mouth 2 (two) times daily.   furosemide (LASIX) 40 MG tablet Take 2 tablets (80 mg total) by mouth 2 (two) times daily.   insulin glargine (LANTUS) 100 UNIT/ML injection Inject 0.25 mLs (25 Units total) into the skin daily. PLEASE INCREASE INSULIN BY 1 UNIT DAILY UNTIL FBG IS <130, THEN HOLD AT THAT DOSE   ipratropium-albuterol (DUONEB) 0.5-2.5 (3) MG/3ML SOLN Take 3 mLs by nebulization 3 (three) times daily. (Patient taking differently: Take 3 mLs by nebulization every 4 (four) hours as needed (shortness of breath).)   Lancets Misc. (ACCU-CHEK FASTCLIX LANCET) KIT Check BS QAM - DX: E11.9   losartan (COZAAR) 50 MG tablet Take 1 tablet (50 mg total) by mouth daily.   metFORMIN (GLUCOPHAGE) 1000 MG tablet TAKE 1 TABLET BY MOUTH TWICE DAILY WITH a meal   Multiple Vitamin (MULTIVITAMIN) capsule Take 1 capsule by mouth in the morning. Mens one a day   ONETOUCH ULTRA test strip USE TO CHECK BLOOD SUGAR EVERY MORNING   potassium chloride SA (KLOR-CON M20) 20 MEQ tablet Take 1 tablet (20 mEq total) by mouth 2 (two) times daily.   Semaglutide, 2 MG/DOSE, (OZEMPIC, 2 MG/DOSE,) 8 MG/3ML SOPN INJECT 2 MG UNDER THE SKIN WEEKLY   spironolactone (ALDACTONE) 25 MG tablet Take 1 tablet (25 mg total) by mouth daily.   Zinc 30 MG CAPS Take 30 mg by mouth daily.                    Objective:   Physical Exam  08/30/2023        252  02/28/2023          274 11/29/2022          274  04/20/2013        342     03/03/13 344 lb (156.037 kg)  02/18/13 327 lb (148.326 kg)  01/08/13 352 lb (159.666 kg)    Vital signs reviewed  08/30/2023  - Note at rest 02 sats  95% on RA   General appearance:    amb pleasant wm nad / pseudowheeze resolves with open mouth on insp and breathe out thru nose    HEENT : Oropharynx  nl     Nasal turbinates nl    NECK :  without  apparent JVD/ palpable Nodes/TM    LUNGS: no acc muscle use,  Nl contour chest which is clear to A and P bilaterally without cough on insp or exp maneuvers   CV:  RRR  no s3   2/6 SEM s   increase in P2, and trace edema L > R   ABD: obese  soft and nontender with nl inspiratory excursion in the supine position. No bruits or organomegaly appreciated   MS:  Nl gait/ ext warm without deformities Or obvious joint restrictions  calf tenderness, cyanosis or clubbing    SKIN: warm and dry with chronic LE venous stasis changes L > R   NEURO:  alert, approp, nl sensorium with  no motor or cerebellar deficits apparent.       Assessment & Plan:

## 2023-08-30 ENCOUNTER — Ambulatory Visit: Payer: Medicare Other | Admitting: Internal Medicine

## 2023-08-30 ENCOUNTER — Telehealth: Payer: Self-pay | Admitting: Family Medicine

## 2023-08-30 ENCOUNTER — Encounter: Payer: Self-pay | Admitting: Internal Medicine

## 2023-08-30 VITALS — BP 100/61 | HR 78 | Ht 73.0 in | Wt 252.0 lb

## 2023-08-30 DIAGNOSIS — F17210 Nicotine dependence, cigarettes, uncomplicated: Secondary | ICD-10-CM

## 2023-08-30 DIAGNOSIS — J4489 Other specified chronic obstructive pulmonary disease: Secondary | ICD-10-CM | POA: Diagnosis not present

## 2023-08-30 NOTE — Patient Instructions (Addendum)
Ok to just take the symbicort 160  up to 12 hours as needed   I  would do yearly  Low dose free CT chest if not getting one anyway for other reasons  - due April 2025  In meantime >>>  The key is to stop smoking completely before smoking completely stops you!    If you are satisfied with your treatment plan,  let your doctor know and he/she can either refill your medications or you can return here when your prescription runs out.     If in any way you are not 100% satisfied,  please tell us.  If 100% better, tell your friends!  Pulmonary follow up is as needed

## 2023-08-30 NOTE — Telephone Encounter (Signed)
Copied from CRM [id number]. Topic: General - Phone/Fax/Address >> Aug 30, 2023  2:38 PM Mosetta Putt H wrote: Reason for CRM: Company is requesting fax be sent over with lab work

## 2023-08-30 NOTE — Assessment & Plan Note (Signed)
Counseled re importance of smoking cessation but did not meet time criteria for separate billing    Low-dose CT lung cancer screening is recommended for patients who are 69-72 years of age with a 20+ pack-year history of smoking and who are currently smoking or quit <=15 years ago. No coughing up blood  No unintentional weight loss of > 15 pounds in the last 6 months - pt is eligible for scanning yearly until age 73   He needs to do this in April 2025 and deferred to PCP    There was a question about ILD raised but this should be addressed adequately by observing for progressive symptoms not attributable to chf or obesity / conditioning (serial peak ex 02 sats would be the best/pt advised) and f/u here can be prn          Each maintenance medication was reviewed in detail including emphasizing most importantly the difference between maintenance and prns and under what circumstances the prns are to be triggered using an action plan format where appropriate.  Total time for H and P, chart review, counseling, reviewing hfa device(s) and generating customized AVS unique to this office visit / same day charting  > 30 min summary final f/u ov

## 2023-09-11 ENCOUNTER — Other Ambulatory Visit: Payer: Self-pay

## 2023-09-11 ENCOUNTER — Telehealth: Payer: Self-pay | Admitting: Family Medicine

## 2023-09-11 DIAGNOSIS — E1100 Type 2 diabetes mellitus with hyperosmolarity without nonketotic hyperglycemic-hyperosmolar coma (NKHHC): Secondary | ICD-10-CM

## 2023-09-11 DIAGNOSIS — I5032 Chronic diastolic (congestive) heart failure: Secondary | ICD-10-CM

## 2023-09-11 MED ORDER — OZEMPIC (2 MG/DOSE) 8 MG/3ML ~~LOC~~ SOPN: 2.0000 mg | PEN_INJECTOR | SUBCUTANEOUS | 1 refills | Status: DC

## 2023-09-11 NOTE — Telephone Encounter (Signed)
Copied from CRM [id number]. Topic: Clinical - Medication Refill >> Sep 11, 2023  9:25 AM Fuller Mandril wrote: Most Recent Primary Care Visit:  Provider: [facility]  Department: [facility]  Visit Type: LAB  Date: 05/15/2023  Medication: Semaglutide, 2 MG/DOSE, (OZEMPIC, 2 MG/DOSE,) 8 MG/3ML SOPN  Has the patient contacted their pharmacy? Pharmacy is calling to request refill. States it is getting rejected. Wanted to know if provider number has changed? (Agent: If no, request that the patient contact the pharmacy for the refill. If patient does not wish to contact the pharmacy document the reason why and proceed with request.) (Agent: If yes, when and what did the pharmacy advise?)  Is this the correct pharmacy for this prescription? Yes If no, delete pharmacy and type the correct one.  This is the patient's preferred pharmacy:  [facility] - [place], [place] - [address] [place] [address] Phone: [phone] Fax: [phone]   Has the prescription been filled recently? 07/26/2023  Is the patient out of the medication? Yes - Day 35 per pharmacy   Has the patient been seen for an appointment in the last year OR does the patient have an upcoming appointment? Yes  Can we respond through MyChart? No  Agent: Please be advised that Rx refills may take up to 3 business days. We ask that you follow-up with your pharmacy.

## 2023-09-17 ENCOUNTER — Ambulatory Visit (INDEPENDENT_AMBULATORY_CARE_PROVIDER_SITE_OTHER): Payer: Medicare Other

## 2023-09-17 DIAGNOSIS — I5032 Chronic diastolic (congestive) heart failure: Secondary | ICD-10-CM | POA: Diagnosis not present

## 2023-09-17 LAB — CUP PACEART REMOTE DEVICE CHECK
Battery Remaining Longevity: 144 mo
Battery Voltage: 3.07 V
Brady Statistic AP VP Percent: 48.44 %
Brady Statistic AP VS Percent: 0 %
Brady Statistic AS VP Percent: 51.52 %
Brady Statistic AS VS Percent: 0.04 %
Brady Statistic RA Percent Paced: 48.31 %
Brady Statistic RV Percent Paced: 99.96 %
Date Time Interrogation Session: 20241223182955
Implantable Lead Connection Status: 753985
Implantable Lead Connection Status: 753985
Implantable Lead Implant Date: 20231222
Implantable Lead Implant Date: 20231222
Implantable Lead Location: 753859
Implantable Lead Location: 753860
Implantable Lead Model: 3830
Implantable Lead Model: 5076
Implantable Pulse Generator Implant Date: 20231222
Lead Channel Impedance Value: 342 Ohm
Lead Channel Impedance Value: 399 Ohm
Lead Channel Impedance Value: 418 Ohm
Lead Channel Impedance Value: 570 Ohm
Lead Channel Pacing Threshold Amplitude: 0.75 V
Lead Channel Pacing Threshold Amplitude: 0.875 V
Lead Channel Pacing Threshold Pulse Width: 0.4 ms
Lead Channel Pacing Threshold Pulse Width: 0.4 ms
Lead Channel Sensing Intrinsic Amplitude: 17.625 mV
Lead Channel Sensing Intrinsic Amplitude: 19.5 mV
Lead Channel Sensing Intrinsic Amplitude: 2.75 mV
Lead Channel Sensing Intrinsic Amplitude: 2.75 mV
Lead Channel Setting Pacing Amplitude: 1.5 V
Lead Channel Setting Pacing Amplitude: 2 V
Lead Channel Setting Pacing Pulse Width: 0.4 ms
Lead Channel Setting Sensing Sensitivity: 0.9 mV
Zone Setting Status: 755011

## 2023-09-19 ENCOUNTER — Telehealth: Payer: Self-pay | Admitting: Family Medicine

## 2023-09-19 ENCOUNTER — Ambulatory Visit (INDEPENDENT_AMBULATORY_CARE_PROVIDER_SITE_OTHER): Payer: Medicare Other | Admitting: *Deleted

## 2023-09-19 DIAGNOSIS — Z0000 Encounter for general adult medical examination without abnormal findings: Secondary | ICD-10-CM

## 2023-09-19 NOTE — Progress Notes (Signed)
Subjective:   Steven Henry is a 72 y.o. male who presents for Medicare Annual/Subsequent preventive examination.  Visit Complete: Virtual I connected with  [name] on 09/19/23 by a audio enabled telemedicine application and verified that I am speaking with the correct person using two identifiers.  Patient Location: Home  Provider Location: Home Office  I discussed the limitations of evaluation and management by telemedicine. The patient expressed understanding and agreed to proceed.  Vital Signs: Because this visit was a virtual/telehealth visit, some criteria may be missing or patient reported. Any vitals not documented were not able to be obtained and vitals that have been documented are patient reported.   Cardiac Risk Factors include: advanced age (>51men, >27 women);diabetes mellitus;male gender;family history of premature cardiovascular disease;obesity (BMI >30kg/m2);smoking/ tobacco exposure;hypertension     Objective:    There were no vitals filed for this visit. There is no height or weight on file to calculate BMI.     09/19/2023    9:24 AM 06/25/2023   11:41 AM 01/09/2023    9:12 AM 11/05/2022    9:47 AM 10/26/2022    4:43 AM 10/26/2022    1:29 AM 09/06/2022    2:20 PM  Advanced Directives  Does Patient Have a Medical Advance Directive? No No No No No No No  Would patient like information on creating a medical advance directive? No - Patient declined   No - Patient declined No - Patient declined No - Patient declined Yes (MAU/Ambulatory/Procedural Areas - Information given)    Current Medications (verified) Outpatient Encounter Medications as of 09/19/2023  Medication Sig   Accu-Chek FastClix Lancets MISC Check BS QAM - DX: E11.9   albuterol (VENTOLIN HFA) 108 (90 Base) MCG/ACT inhaler Inhale 2 puffs into the lungs every 6 (six) hours as needed for wheezing or shortness of breath.   apixaban (ELIQUIS) 5 MG TABS tablet TAKE 1 TABLET BY MOUTH TWICE DAILY    atorvastatin (LIPITOR) 40 MG tablet TAKE 1 TABLET BY MOUTH DAILY   bisoprolol (ZEBETA) 5 MG tablet Take 1 tablet (5 mg total) by mouth daily.   Blood Glucose Monitoring Suppl (ONE TOUCH ULTRA 2) w/Device KIT USETO CHECK BLOOD SUGAR EVERY MORNING   budesonide-formoterol (SYMBICORT) 160-4.5 MCG/ACT inhaler Inhale 2 puffs into the lungs in the morning and at bedtime. (Patient taking differently: Inhale 2 puffs into the lungs 2 (two) times daily as needed (shortness of breath).)   Cholecalciferol (VITAMIN D-3) 125 MCG (5000 UT) TABS Take 5,000 Units by mouth 2 (two) times daily.   Continuous Blood Gluc Receiver (FREESTYLE LIBRE 2 READER) DEVI For continuous blood glucose monitoring   Continuous Glucose Sensor (FREESTYLE LIBRE 2 SENSOR) MISC APPLY 1 SENSOR ON THE SKIN EVERY 14 DAYS, USE AS DIRECTED TO CHECK BLOOD SUGAR   empagliflozin (JARDIANCE) 25 MG TABS tablet TAKE 1 TABLET BY MOUTH DAILY BEFORE breakfast   flecainide (TAMBOCOR) 100 MG tablet Take 1 tablet (100 mg total) by mouth 2 (two) times daily.   furosemide (LASIX) 40 MG tablet Take 2 tablets (80 mg total) by mouth 2 (two) times daily.   insulin glargine (LANTUS) 100 UNIT/ML injection Inject 0.25 mLs (25 Units total) into the skin daily. PLEASE INCREASE INSULIN BY 1 UNIT DAILY UNTIL FBG IS <130, THEN HOLD AT THAT DOSE   ipratropium-albuterol (DUONEB) 0.5-2.5 (3) MG/3ML SOLN Take 3 mLs by nebulization 3 (three) times daily. (Patient taking differently: Take 3 mLs by nebulization every 4 (four) hours as needed (shortness of  breath).)   Lancets Misc. (ACCU-CHEK FASTCLIX LANCET) KIT Check BS QAM - DX: E11.9   losartan (COZAAR) 50 MG tablet Take 1 tablet (50 mg total) by mouth daily.   metFORMIN (GLUCOPHAGE) 1000 MG tablet TAKE 1 TABLET BY MOUTH TWICE DAILY WITH a meal   Multiple Vitamin (MULTIVITAMIN) capsule Take 1 capsule by mouth in the morning. Mens one a day   ONETOUCH ULTRA test strip USE TO CHECK BLOOD SUGAR EVERY MORNING   potassium  chloride SA (KLOR-CON M20) 20 MEQ tablet Take 1 tablet (20 mEq total) by mouth 2 (two) times daily.   Semaglutide, 2 MG/DOSE, (OZEMPIC, 2 MG/DOSE,) 8 MG/3ML SOPN Inject 2 mg into the skin once a week.   spironolactone (ALDACTONE) 25 MG tablet Take 1 tablet (25 mg total) by mouth daily.   Zinc 30 MG CAPS Take 30 mg by mouth daily.   No facility-administered encounter medications on file as of 09/19/2023.    Allergies (verified) Patient has no known allergies.   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 Excell Seltzer and Dr. Laneta Simmers   Severe aortic stenosis    Sleep apnea    not on CPAP    Past Surgical History:  Procedure Laterality Date   CARDIOVERSION N/A 11/05/2022   Procedure: CARDIOVERSION;  Surgeon: Maisie Fus, MD;  Location: [facility] ENDOSCOPY;  Service: Cardiovascular;  Laterality: N/A;   CARDIOVERSION N/A 01/09/2023   Procedure: CARDIOVERSION;  Surgeon: Jake Bathe, MD;  Location: [facility] INVASIVE CV LAB;  Service: Cardiovascular;  Laterality: N/A;   COLONOSCOPY N/A 09/16/2015   Procedure: COLONOSCOPY;  Surgeon: Corbin Ade, MD;  Location: [facility] ENDO SUITE;  Service: Endoscopy;  Laterality: N/A;  8:15 AM - pt has c pap   ENDOVENOUS ABLATION SAPHENOUS VEIN W/ LASER Right 08/26/2017   endovenous laser ablation right greater saphenous vein by Josephina Gip MD    INTRAOPERATIVE TRANSTHORACIC ECHOCARDIOGRAM N/A 06/25/2023   Procedure: INTRAOPERATIVE TRANSTHORACIC ECHOCARDIOGRAM;  Surgeon: [name], MD;  Location: [facility] INVASIVE CV LAB;  Service: Open Heart Surgery;   Laterality: N/A;   PACEMAKER IMPLANT N/A 09/14/2022   Procedure: PACEMAKER IMPLANT;  Surgeon: Duke Salvia, MD;  Location: [facility] INVASIVE CV LAB;  Service: Cardiovascular;  Laterality: N/A;   RIGHT/LEFT HEART CATH AND CORONARY ANGIOGRAPHY N/A 02/11/2023   Procedure: RIGHT/LEFT HEART CATH AND CORONARY ANGIOGRAPHY;  Surgeon: [name], MD;  Location: [facility] INVASIVE CV LAB;  Service: Cardiovascular;  Laterality: N/A;   stab phlebectomy  Right 01/07/2018   stab phlebectomy 10-20 incisions right leg by Josephina Gip MD    TRANSCATHETER AORTIC VALVE REPLACEMENT, TRANSFEMORAL Right 06/25/2023   Procedure: Transcatheter Aortic Valve Replacement, Transfemoral;  Surgeon: [name], MD;  Location: [facility] INVASIVE CV LAB;  Service: Open Heart Surgery;  Laterality: Right;   Family History  Problem Relation Age of Onset   Colon cancer Mother    Diabetes Mother    Heart disease Mother    Diabetes Sister    Diabetes Sister    Diabetes Sister  Social History   Socioeconomic History   Marital status: Divorced    Spouse name: Not on file   Number of children: 2   Years of education: Not on file   Highest education level: Not on file  Occupational History   Occupation: Self Employed  Tobacco Use   Smoking status: Some Days    Current packs/day: 1.50    Average packs/day: 1.5 packs/day for 65.5 years (98.2 ttl pk-yrs)    Types: Cigarettes    Start date: 04/04/1958   Smokeless tobacco: Never   Tobacco comments:    Quit smoking day of DCCV 11/07/22    Started back due to stress - 1/2 pack per day - trying to cut back down. - 12/17/2022  Vaping Use   Vaping status: Never Used  Substance and Sexual Activity   Alcohol use: Yes    Alcohol/week: 1.0 standard drink of alcohol    Types: 1 Cans of beer per week    Comment: 1 beer once a month 10/12/22   Drug use: No   Sexual activity: Not Currently    Comment: divorced, 2 daughters.  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  Other Topics Concern    Not on file  Social History Narrative   Pt states he usually smokes after eating or when stressed   Social Drivers of Health   Financial Resource Strain: Low Risk  (09/19/2023)   Overall Financial Resource Strain (CARDIA)    Difficulty of Paying Living Expenses: Not hard at all  Food Insecurity: No Food Insecurity (09/19/2023)   Hunger Vital Sign    Worried About Running Out of Food in the Last Year: Never true    Ran Out of Food in the Last Year: Never true  Transportation Needs: No Transportation Needs (09/19/2023)   PRAPARE - Administrator, Civil Service (Medical): No    Lack of Transportation (Non-Medical): No  Physical Activity: Inactive (09/19/2023)   Exercise Vital Sign    Days of Exercise per Week: 0 days    Minutes of Exercise per Session: 0 min  Stress: No Stress Concern Present (09/19/2023)   [employer] of Occupational Health - Occupational Stress Questionnaire    Feeling of Stress : Not at all  Social Connections: Socially Isolated (09/19/2023)   Social Connection and Isolation Panel [NHANES]    Frequency of Communication with Friends and Family: More than three times a week    Frequency of Social Gatherings with Friends and Family: Three times a week    Attends Religious Services: Never    Active Member of Clubs or Organizations: No    Attends Banker Meetings: Never    Marital Status: Divorced    Tobacco Counseling Ready to quit: Not Answered Counseling given: Not Answered Tobacco comments: Quit smoking day of DCCV 11/07/22 Started back due to stress - 1/2 pack per day - trying to cut back down. - 12/17/2022   Clinical Intake:  Pre-visit preparation completed: Yes  Pain : No/denies pain     Diabetes: Yes CBG done?: No Did pt. bring in CBG monitor from home?: No  How often do you need to have someone help you when you read instructions, pamphlets, or other written materials from your doctor or pharmacy?: 1 -  Never  Interpreter Needed?: No  Information entered by :: Remi Haggard LPN   Activities of Daily Living    09/19/2023    9:25 AM 06/25/2023   11:49 AM  In your present  state of health, do you have any difficulty performing the following activities:  Hearing? 0   Vision? 0   Difficulty concentrating or making decisions? 0   Walking or climbing stairs? 0   Dressing or bathing? 0   Doing errands, shopping? 0 0  Preparing Food and eating ? N   Using the Toilet? N   In the past six months, have you accidently leaked urine? N   Do you have problems with loss of bowel control? N   Managing your Medications? N   Managing your Finances? N   Housekeeping or managing your Housekeeping? N     Patient Care Team: Donita Brooks, MD as PCP - General (Family Medicine) Mallipeddi, Orion Modest, MD as PCP - Cardiology (Cardiology) [facility] as Consulting Physician Nyoka Cowden, MD as Consulting Physician (Pulmonary Disease)  Indicate any recent Medical Services you may have received from other than [facility] providers in the past year (date may be approximate).     Assessment:   This is a routine wellness examination for Mead.  Hearing/Vision screen Hearing Screening - Comments:: No trouble hearing Vision Screening - Comments:: Not up to date   Goals Addressed             This Visit's Progress    Patient Stated       Continue current lifestyle       Depression Screen    09/19/2023    9:27 AM 12/07/2022    8:36 AM 11/01/2022    4:37 PM 11/01/2022    1:56 PM 09/28/2022    3:07 PM 09/06/2022    2:18 PM 08/22/2022   10:32 AM  PHQ 2/9 Scores  PHQ - 2 Score 0 0 0 0 0 0 0  PHQ- 9 Score 0          Fall Risk    09/19/2023    9:21 AM 12/07/2022    8:36 AM 11/01/2022    4:37 PM 11/01/2022    1:56 PM 09/28/2022    3:07 PM  Fall Risk   Falls in the past year? 0 0 1 0 0  Number falls in past yr: 0 0 0 0 0  Injury with Fall? 0 0 1 0 0  Risk for fall  due to :  No Fall Risks Impaired balance/gait;History of fall(s) No Fall Risks No Fall Risks  Follow up Falls evaluation completed;Education provided;Falls prevention discussed Falls prevention discussed Education provided;Falls prevention discussed Falls prevention discussed Falls prevention discussed    MEDICARE RISK AT HOME: Medicare Risk at Home Any stairs in or around the home?: No If so, are there any without handrails?: No Home free of loose throw rugs in walkways, pet beds, electrical cords, etc?: Yes Adequate lighting in your home to reduce risk of falls?: Yes Life alert?: No Use of a cane, walker or w/c?: No Grab bars in the bathroom?: Yes Shower chair or bench in shower?: No Elevated toilet seat or a handicapped toilet?: Yes  TIMED UP AND GO:  Was the test performed?  No    Cognitive Function:        09/19/2023    9:23 AM 09/06/2022    2:20 PM  6CIT Screen  What Year? 0 points 0 points  What month? 0 points 0 points  What time? 0 points 0 points  Count back from 20 0 points 0 points  Months in reverse 0 points 0 points  Repeat phrase 0 points  0 points  Total Score 0 points 0 points    Immunizations Immunization History  Administered Date(s) Administered   Tdap 02/03/2019, 02/03/2019    TDAP status: Up to date  Flu Vaccine status: Declined, Education has been provided regarding the importance of this vaccine but patient still declined. Advised may receive this vaccine at local pharmacy or Health Dept. Aware to provide a copy of the vaccination record if obtained from local pharmacy or Health Dept. Verbalized acceptance and understanding.  Pneumococcal vaccine status: Declined,  Education has been provided regarding the importance of this vaccine but patient still declined. Advised may receive this vaccine at local pharmacy or Health Dept. Aware to provide a copy of the vaccination record if obtained from local pharmacy or Health Dept. Verbalized acceptance and  understanding.   Covid-19 vaccine status: Declined, Education has been provided regarding the importance of this vaccine but patient still declined. Advised may receive this vaccine at local pharmacy or Health Dept.or vaccine clinic. Aware to provide a copy of the vaccination record if obtained from local pharmacy or Health Dept. Verbalized acceptance and understanding.  Qualifies for Shingles Vaccine? Yes   Zostavax completed No   Shingrix Completed?: No.    Education has been provided regarding the importance of this vaccine. Patient has been advised to call insurance company to determine out of pocket expense if they have not yet received this vaccine. Advised may also receive vaccine at local pharmacy or Health Dept. Verbalized acceptance and understanding.  Screening Tests Health Maintenance  Topic Date Due   HEMOGLOBIN A1C  06/09/2023   Diabetic kidney evaluation - Urine ACR  08/23/2023   OPHTHALMOLOGY EXAM  09/19/2023 (Originally 11/29/2021)   COVID-19 Vaccine (1) 10/05/2023 (Originally 04/04/1956)   Zoster Vaccines- Shingrix (1 of 2) 12/18/2023 (Originally 04/04/1970)   INFLUENZA VACCINE  12/23/2023 (Originally 04/25/2023)   Pneumonia Vaccine 31+ Years old (1 of 2 - PCV) 09/18/2024 (Originally 04/04/1957)   FOOT EXAM  10/23/2023   Lung Cancer Screening  01/21/2024   Diabetic kidney evaluation - eGFR measurement  07/04/2024   Medicare Annual Wellness (AWV)  09/18/2024   DTaP/Tdap/Td (3 - Td or Tdap) 02/02/2029   Colonoscopy  12/10/2032   Hepatitis C Screening  Completed   HPV VACCINES  Aged Out    Health Maintenance  Health Maintenance Due  Topic Date Due   HEMOGLOBIN A1C  06/09/2023   Diabetic kidney evaluation - Urine ACR  08/23/2023    Colorectal cancer screening: Type of screening: Colonoscopy. Completed 2024. Repeat every 10 years  Lung Cancer Screening: (Low Dose CT Chest recommended if Age 35-80 years, 20 pack-year currently smoking OR have quit w/in 15years.) does  qualify.   Lung Cancer Screening Referral: due 12-2023  Additional Screening:  Hepatitis C Screening: does not qualify; Completed 2023  Vision Screening: Recommended annual ophthalmology exams for early detection of glaucoma and other disorders of the eye. Is the patient up to date with their annual eye exam?  No  Who is the provider or what is the name of the office in which the patient attends annual eye exams? Education provided If pt is not established with a provider, would they like to be referred to a provider to establish care? No .   Dental Screening: Recommended annual dental exams for proper oral hygiene  Nutrition Risk Assessment:  Has the patient had any N/V/D within the last 2 months?  No  Does the patient have any non-healing wounds?  No  Has the patient  had any unintentional weight loss or weight gain?  No   Diabetes:  Is the patient diabetic?  Yes  If diabetic, was a CBG obtained today?  No  Did the patient bring in their glucometer from home?  No  How often do you monitor your CBG's? 1 x a day.   Financial Strains and Diabetes Management:  Are you having any financial strains with the device, your supplies or your medication? No .  Does the patient want to be seen by Chronic Care Management for management of their diabetes?  No  Would the patient like to be referred to a Nutritionist or for Diabetic Management?  No   Diabetic Exams:  Diabetic Eye Exam: . Overdue for diabetic eye exam. Pt has been advised about the importance in completing this exam.   Education provided patient will call to schedule Diabetic Foot Exam: . Pt has been advised about the importance in completing this exam.   Community Resource Referral / Chronic Care Management: CRR required this visit?  No   CCM required this visit?  No     Plan:     I have personally reviewed and noted the following in the patient's chart:   Medical and social history Use of alcohol, tobacco or  illicit drugs  Current medications and supplements including opioid prescriptions. Patient is not currently taking opioid prescriptions. Functional ability and status Nutritional status Physical activity Advanced directives List of other physicians Hospitalizations, surgeries, and ER visits in previous 12 months Vitals Screenings to include cognitive, depression, and falls Referrals and appointments  In addition, I have reviewed and discussed with patient certain preventive protocols, quality metrics, and best practice recommendations. A written personalized care plan for preventive services as well as general preventive health recommendations were provided to patient.     Remi Haggard, LPN   [id number]   After Visit Summary: (MyChart) Due to this being a telephonic visit, the after visit summary with patients personalized plan was offered to patient via MyChart   Nurse Notes:

## 2023-09-19 NOTE — Telephone Encounter (Signed)
Copied from CRM [id number]. Topic: Medical Record Request - Provider/Facility Request >> Sep 19, 2023 11:07 AM Geroge Baseman wrote: Reason for CRM: Neysa Bonito from [place] needs record of patients oxygen prescription sent over to complete verification for a new payor to take effect.

## 2023-09-19 NOTE — Patient Instructions (Signed)
Steven Henry , Thank you for taking time to come for your Medicare Wellness Visit. I appreciate your ongoing commitment to your health goals. Please review the following plan we discussed and let me know if I can assist you in the future.   Screening recommendations/referrals: Colonoscopy: up to date Recommended yearly ophthalmology/optometry visit for glaucoma screening and checkup Recommended yearly dental visit for hygiene and checkup  Vaccinations: Influenza vaccine: Education provided Pneumococcal vaccine: Education provided Tdap vaccine: up to date Shingles vaccine: Education provided    Advanced directives: Education provided    Preventive Care 65 Years and Older, Male Preventive care refers to lifestyle choices and visits with your health care provider that can promote health and wellness. What does preventive care include? A yearly physical exam. This is also called an annual well check. Dental exams once or twice a year. Routine eye exams. Ask your health care provider how often you should have your eyes checked. Personal lifestyle choices, including: Daily care of your teeth and gums. Regular physical activity. Eating a healthy diet. Avoiding tobacco and drug use. Limiting alcohol use. Practicing safe sex. Taking low doses of aspirin every day. Taking vitamin and mineral supplements as recommended by your health care provider. What happens during an annual well check? The services and screenings done by your health care provider during your annual well check will depend on your age, overall health, lifestyle risk factors, and family history of disease. Counseling  Your health care provider may ask you questions about your: Alcohol use. Tobacco use. Drug use. Emotional well-being. Home and relationship well-being. Sexual activity. Eating habits. History of falls. Memory and ability to understand (cognition). Work and work Astronomer. Screening  You may have the  following tests or measurements: Height, weight, and BMI. Blood pressure. Lipid and cholesterol levels. These may be checked every 5 years, or more frequently if you are over 57 years old. Skin check. Lung cancer screening. You may have this screening every year starting at age 24 if you have a 30-pack-year history of smoking and currently smoke or have quit within the past 15 years. Fecal occult blood test (FOBT) of the stool. You may have this test every year starting at age 59. Flexible sigmoidoscopy or colonoscopy. You may have a sigmoidoscopy every 5 years or a colonoscopy every 10 years starting at age 2. Prostate cancer screening. Recommendations will vary depending on your family history and other risks. Hepatitis C blood test. Hepatitis B blood test. Sexually transmitted disease (STD) testing. Diabetes screening. This is done by checking your blood sugar (glucose) after you have not eaten for a while (fasting). You may have this done every 1-3 years. Abdominal aortic aneurysm (AAA) screening. You may need this if you are a current or former smoker. Osteoporosis. You may be screened starting at age 31 if you are at high risk. Talk with your health care provider about your test results, treatment options, and if necessary, the need for more tests. Vaccines  Your health care provider may recommend certain vaccines, such as: Influenza vaccine. This is recommended every year. Tetanus, diphtheria, and acellular pertussis (Tdap, Td) vaccine. You may need a Td booster every 10 years. Zoster vaccine. You may need this after age 42. Pneumococcal 13-valent conjugate (PCV13) vaccine. One dose is recommended after age 23. Pneumococcal polysaccharide (PPSV23) vaccine. One dose is recommended after age 39. Talk to your health care provider about which screenings and vaccines you need and how often you need them. This information is  not intended to replace advice given to you by your health care  provider. Make sure you discuss any questions you have with your health care provider. Document Released: 10/07/2015 Document Revised: 05/30/2016 Document Reviewed: 07/12/2015 Elsevier Interactive Patient Education  2017 [employer].  Fall Prevention in the Home Falls can cause injuries. They can happen to people of all ages. There are many things you can do to make your home safe and to help prevent falls. What can I do on the outside of my home? Regularly fix the edges of walkways and driveways and fix any cracks. Remove anything that might make you trip as you walk through a door, such as a raised step or threshold. Trim any bushes or trees on the path to your home. Use bright outdoor lighting. Clear any walking paths of anything that might make someone trip, such as rocks or tools. Regularly check to see if handrails are loose or broken. Make sure that both sides of any steps have handrails. Any raised decks and porches should have guardrails on the edges. Have any leaves, snow, or ice cleared regularly. Use sand or salt on walking paths during winter. Clean up any spills in your garage right away. This includes oil or grease spills. What can I do in the bathroom? Use night lights. Install grab bars by the toilet and in the tub and shower. Do not use towel bars as grab bars. Use non-skid mats or decals in the tub or shower. If you need to sit down in the shower, use a plastic, non-slip stool. Keep the floor dry. Clean up any water that spills on the floor as soon as it happens. Remove soap buildup in the tub or shower regularly. Attach bath mats securely with double-sided non-slip rug tape. Do not have throw rugs and other things on the floor that can make you trip. What can I do in the bedroom? Use night lights. Make sure that you have a light by your bed that is easy to reach. Do not use any sheets or blankets that are too big for your bed. They should not hang down onto the  floor. Have a firm chair that has side arms. You can use this for support while you get dressed. Do not have throw rugs and other things on the floor that can make you trip. What can I do in the kitchen? Clean up any spills right away. Avoid walking on wet floors. Keep items that you use a lot in easy-to-reach places. If you need to reach something above you, use a strong step stool that has a grab bar. Keep electrical cords out of the way. Do not use floor polish or wax that makes floors slippery. If you must use wax, use non-skid floor wax. Do not have throw rugs and other things on the floor that can make you trip. What can I do with my stairs? Do not leave any items on the stairs. Make sure that there are handrails on both sides of the stairs and use them. Fix handrails that are broken or loose. Make sure that handrails are as long as the stairways. Check any carpeting to make sure that it is firmly attached to the stairs. Fix any carpet that is loose or worn. Avoid having throw rugs at the top or bottom of the stairs. If you do have throw rugs, attach them to the floor with carpet tape. Make sure that you have a light switch at the top of the  stairs and the bottom of the stairs. If you do not have them, ask someone to add them for you. What else can I do to help prevent falls? Wear shoes that: Do not have high heels. Have rubber bottoms. Are comfortable and fit you well. Are closed at the toe. Do not wear sandals. If you use a stepladder: Make sure that it is fully opened. Do not climb a closed stepladder. Make sure that both sides of the stepladder are locked into place. Ask someone to hold it for you, if possible. Clearly mark and make sure that you can see: Any grab bars or handrails. First and last steps. Where the edge of each step is. Use tools that help you move around (mobility aids) if they are needed. These include: Canes. Walkers. Scooters. Crutches. Turn on the  lights when you go into a dark area. Replace any light bulbs as soon as they burn out. Set up your furniture so you have a clear path. Avoid moving your furniture around. If any of your floors are uneven, fix them. If there are any pets around you, be aware of where they are. Review your medicines with your doctor. Some medicines can make you feel dizzy. This can increase your chance of falling. Ask your doctor what other things that you can do to help prevent falls. This information is not intended to replace advice given to you by your health care provider. Make sure you discuss any questions you have with your health care provider. Document Released: 07/07/2009 Document Revised: 02/16/2016 Document Reviewed: 10/15/2014 Elsevier Interactive Patient Education  2017 [employer].

## 2023-09-23 ENCOUNTER — Other Ambulatory Visit: Payer: Self-pay | Admitting: Family Medicine

## 2023-09-23 DIAGNOSIS — E1100 Type 2 diabetes mellitus with hyperosmolarity without nonketotic hyperglycemic-hyperosmolar coma (NKHHC): Secondary | ICD-10-CM

## 2023-09-23 DIAGNOSIS — N183 Chronic kidney disease, stage 3 unspecified: Secondary | ICD-10-CM

## 2023-10-03 ENCOUNTER — Encounter: Payer: Self-pay | Admitting: Family Medicine

## 2023-10-03 ENCOUNTER — Ambulatory Visit (INDEPENDENT_AMBULATORY_CARE_PROVIDER_SITE_OTHER): Payer: Medicare Other | Admitting: Family Medicine

## 2023-10-03 VITALS — BP 120/64 | HR 56 | Temp 98.1°F | Ht 73.0 in | Wt 251.6 lb

## 2023-10-03 DIAGNOSIS — E1100 Type 2 diabetes mellitus with hyperosmolarity without nonketotic hyperglycemic-hyperosmolar coma (NKHHC): Secondary | ICD-10-CM | POA: Diagnosis not present

## 2023-10-03 DIAGNOSIS — I5032 Chronic diastolic (congestive) heart failure: Secondary | ICD-10-CM

## 2023-10-03 DIAGNOSIS — I480 Paroxysmal atrial fibrillation: Secondary | ICD-10-CM

## 2023-10-03 DIAGNOSIS — J449 Chronic obstructive pulmonary disease, unspecified: Secondary | ICD-10-CM | POA: Diagnosis not present

## 2023-10-03 DIAGNOSIS — N183 Chronic kidney disease, stage 3 unspecified: Secondary | ICD-10-CM | POA: Diagnosis not present

## 2023-10-03 DIAGNOSIS — Z7984 Long term (current) use of oral hypoglycemic drugs: Secondary | ICD-10-CM | POA: Diagnosis not present

## 2023-10-03 NOTE — Progress Notes (Signed)
 Subjective:   Patient is a 73 year old Caucasian gentleman with a history of atrial fibrillation status post cardioversion x 2.  Currently maintaining normal sinus rhythm using flecainide .  He also has a history of congestive heart failure.  Recently was admitted to the hospital for TAVR for severe aortic stenosis.  He also has a history of COPD due to longstanding tobacco abuse as well as insulin -dependent diabetes mellitus.  I last saw the patient in March 2024.  Asked that he come in for management of his diabetes.  His most recent hemoglobin A1c was greater than 8 in March 2024.  Patient is here to get his oxygen  recertified as well. Patient wears oxygen  only at night while sleeping.  On sleep study 8/23, he was found to have: The mean oxygen  saturation was 88.85%. The minimum SpO2 during sleep was 78.00%.   Also since I last saw the patient, he has lost quite a bit of weight.  He was 280 pounds in April of last year.  Today he is 252 pounds.  Seems to be doing well on Ozempic .  He is not checking his sugars regularly even though he has continuous blood glucose monitoring.  However since he has resumed the continuous blood glucose monitoring, he states that sugars have been averaging between 91 and 120.  He states that it was 117 this morning.  He is taking 25 units of Lantus  daily along with Ozempic  and metformin .  He denies any hypoglycemic episodes.   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 Wonda and Dr. Lucas   Severe aortic stenosis    Sleep apnea    not on CPAP     Past Surgical History:  Procedure Laterality Date   CARDIOVERSION N/A 11/05/2022   Procedure: CARDIOVERSION;  Surgeon: Alvan Ronal BRAVO, MD;  Location: [facility] ENDOSCOPY;  Service: Cardiovascular;  Laterality: N/A;   CARDIOVERSION N/A 01/09/2023   Procedure: CARDIOVERSION;  Surgeon: Jeffrie Oneil BROCKS, MD;  Location: [facility] INVASIVE CV LAB;  Service: Cardiovascular;  Laterality: N/A;   COLONOSCOPY N/A 09/16/2015   Procedure: COLONOSCOPY;  Surgeon: Lamar CHRISTELLA Hollingshead, MD;  Location: [facility] ENDO SUITE;  Service: Endoscopy;  Laterality: N/A;  8:15 AM - pt has c pap   ENDOVENOUS ABLATION SAPHENOUS VEIN W/ LASER Right 08/26/2017   endovenous laser ablation right greater saphenous vein by Lynwood Collum MD    INTRAOPERATIVE TRANSTHORACIC ECHOCARDIOGRAM N/A 06/25/2023   Procedure: INTRAOPERATIVE TRANSTHORACIC ECHOCARDIOGRAM;  Surgeon: Wonda Sharper, MD;  Location: [facility] INVASIVE CV LAB;  Service: Open Heart Surgery;  Laterality: N/A;   PACEMAKER IMPLANT N/A 09/14/2022   Procedure: PACEMAKER IMPLANT;  Surgeon: Fernande Elspeth BROCKS, MD;  Location: [facility] INVASIVE CV LAB;  Service: Cardiovascular;  Laterality: N/A;   RIGHT/LEFT HEART CATH AND CORONARY ANGIOGRAPHY N/A 02/11/2023   Procedure: RIGHT/LEFT HEART CATH AND CORONARY ANGIOGRAPHY;  Surgeon: Wonda Sharper, MD;  Location: [facility] INVASIVE CV LAB;  Service: Cardiovascular;  Laterality: N/A;   stab phlebectomy  Right 01/07/2018   stab phlebectomy 10-20 incisions right leg  by Lynwood Collum MD    TRANSCATHETER AORTIC VALVE REPLACEMENT, TRANSFEMORAL Right 06/25/2023   Procedure: Transcatheter Aortic Valve Replacement, Transfemoral;  Surgeon: Wonda Sharper, MD;  Location: [facility] INVASIVE CV LAB;  Service: Open Heart Surgery;  Laterality: Right;   Current Outpatient Medications on File Prior to Visit  Medication Sig Dispense Refill   Accu-Chek FastClix Lancets MISC Check BS QAM - DX: E11.9 100 each 3   albuterol  (VENTOLIN  HFA) 108 (90 Base) MCG/ACT inhaler Inhale 2 puffs into the lungs every 6  (six) hours as needed for wheezing or shortness of breath. 8 g 0   apixaban  (ELIQUIS ) 5 MG TABS tablet TAKE 1 TABLET BY MOUTH TWICE DAILY 180 tablet 0   atorvastatin  (LIPITOR) 40 MG tablet TAKE 1 TABLET BY MOUTH DAILY 90 tablet 1   bisoprolol  (ZEBETA ) 5 MG tablet Take 1 tablet (5 mg total) by mouth daily. 90 tablet 3   Blood Glucose Monitoring Suppl (ONE TOUCH ULTRA 2) w/Device KIT USETO CHECK BLOOD SUGAR EVERY MORNING 1 kit 0   budesonide -formoterol  (SYMBICORT ) 160-4.5 MCG/ACT inhaler Inhale 2 puffs into the lungs in the morning and at bedtime. (Patient taking differently: Inhale 2 puffs into the lungs 2 (two) times daily as needed (shortness of breath).) 1 each 12   Cholecalciferol (VITAMIN D-3) 125 MCG (5000 UT) TABS Take 5,000 Units by mouth 2 (two) times daily.     Continuous Blood Gluc Receiver (FREESTYLE LIBRE 2 READER) DEVI For continuous blood glucose monitoring 1 each 1   Continuous Glucose Sensor (FREESTYLE LIBRE 2 SENSOR) MISC APPLY 1 SENSOR ON THE SKIN EVERY 14 DAYS, USE AS DIRECTED TO CHECK BLOOD SUGAR 1 each 3   empagliflozin  (JARDIANCE ) 25 MG TABS tablet TAKE 1 TABLET BY MOUTH DAILY BEFORE breakfast 90 tablet 0   flecainide  (TAMBOCOR ) 100 MG tablet Take 1 tablet (100 mg total) by mouth 2 (two) times daily. 180 tablet 3   furosemide  (LASIX ) 40 MG tablet Take 2 tablets (80 mg total) by mouth 2 (two) times daily. 360 tablet 3   insulin  glargine (LANTUS ) 100 UNIT/ML injection Inject 0.25 mLs (25 Units total) into the skin daily. PLEASE INCREASE INSULIN  BY 1 UNIT DAILY UNTIL FBG IS <130, THEN HOLD AT THAT DOSE 30 mL 5   ipratropium-albuterol  (DUONEB) 0.5-2.5 (3) MG/3ML SOLN Take 3 mLs by nebulization 3 (three) times daily. (Patient taking differently: Take 3 mLs by nebulization every 4 (four) hours as needed (shortness of breath).) 360 mL 1   Lancets Misc. (ACCU-CHEK FASTCLIX LANCET) KIT Check BS QAM - DX: E11.9 1 kit 0   metFORMIN  (GLUCOPHAGE ) 1000 MG tablet TAKE 1 TABLET BY MOUTH TWICE  DAILY WITH a meal 180 tablet 3   Multiple Vitamin (MULTIVITAMIN) capsule Take 1 capsule by mouth in the morning. Mens one a day     ONETOUCH ULTRA test strip USE TO CHECK BLOOD SUGAR EVERY MORNING 100 strip 12   potassium chloride  SA (KLOR-CON  M20) 20 MEQ tablet Take 1 tablet (20 mEq total) by mouth 2 (two) times daily. 180 tablet 3   Semaglutide , 2 MG/DOSE, (OZEMPIC , 2 MG/DOSE,) 8 MG/3ML SOPN Inject 2 mg into the skin once a week. 3 mL 1   spironolactone  (ALDACTONE ) 25 MG tablet Take 1 tablet (25 mg total) by mouth daily. 90 tablet 3   Zinc 30 MG CAPS Take 30 mg by mouth daily.     No current facility-administered medications on file prior to visit.   No Known Allergies Social History   Socioeconomic History  Marital status: Divorced    Spouse name: Not on file   Number of children: 2   Years of education: Not on file   Highest education level: Not on file  Occupational History   Occupation: Self Employed  Tobacco Use   Smoking status: Some Days    Current packs/day: 1.50    Average packs/day: 1.5 packs/day for 65.5 years (98.2 ttl pk-yrs)    Types: Cigarettes    Start date: 04/04/1958   Smokeless tobacco: Never   Tobacco comments:    Quit smoking day of [facility] 11/07/22    Started back due to stress - 1/2 pack per day - trying to cut back down. - 12/17/2022  Vaping Use   Vaping status: Never Used  Substance and Sexual Activity   Alcohol use: Yes    Alcohol/week: 1.0 standard drink of alcohol    Types: 1 Cans of beer per week    Comment: 1 beer once a month 10/12/22   Drug use: No   Sexual activity: Not Currently    Comment: divorced, 2 daughters.  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  Other Topics Concern   Not on file  Social History Narrative   Pt states he usually smokes after eating or when stressed   Social Drivers of Health   Financial Resource Strain: Low Risk  (09/19/2023)   Overall Financial Resource Strain (CARDIA)    Difficulty of Paying Living  Expenses: Not hard at all  Food Insecurity: No Food Insecurity (09/19/2023)   Hunger Vital Sign    Worried About Running Out of Food in the Last Year: Never true    Ran Out of Food in the Last Year: Never true  Transportation Needs: No Transportation Needs (09/19/2023)   PRAPARE - Administrator, Civil Service (Medical): No    Lack of Transportation (Non-Medical): No  Physical Activity: Inactive (09/19/2023)   Exercise Vital Sign    Days of Exercise per Week: 0 days    Minutes of Exercise per Session: 0 min  Stress: No Stress Concern Present (09/19/2023)   [employer] of Occupational Health - Occupational Stress Questionnaire    Feeling of Stress : Not at all  Social Connections: Socially Isolated (09/19/2023)   Social Connection and Isolation Panel [NHANES]    Frequency of Communication with Friends and Family: More than three times a week    Frequency of Social Gatherings with Friends and Family: Three times a week    Attends Religious Services: Never    Active Member of Clubs or Organizations: No    Attends Banker Meetings: Never    Marital Status: Divorced  Catering Manager Violence: Not At Risk (09/19/2023)   Humiliation, Afraid, Rape, and Kick questionnaire    Fear of Current or Ex-Partner: No    Emotionally Abused: No    Physically Abused: No    Sexually Abused: No     Review of Systems  All other systems reviewed and are negative.      Objective:   Physical Exam Vitals reviewed.  Constitutional:      General: He is not in acute distress.    Appearance: Normal appearance. He is obese. He is not ill-appearing or toxic-appearing.  HENT:     Head: Normocephalic and atraumatic.     Right Ear: Tympanic membrane and ear canal normal.     Left Ear: Tympanic membrane and ear canal normal.     Nose: Nose normal. No congestion or rhinorrhea.  Mouth/Throat:     Mouth: Mucous membranes are moist.     Pharynx: Oropharynx is clear. No  oropharyngeal exudate or posterior oropharyngeal erythema.  Eyes:     General: No scleral icterus.       Right eye: No discharge.        Left eye: No discharge.     Extraocular Movements: Extraocular movements intact.     Conjunctiva/sclera: Conjunctivae normal.     Pupils: Pupils are equal, round, and reactive to light.  Neck:     Vascular: No carotid bruit.  Cardiovascular:     Rate and Rhythm: Normal rate. Rhythm irregular.     Pulses: Normal pulses.     Heart sounds: Normal heart sounds. No murmur heard.    No friction rub. No gallop.  Pulmonary:     Effort: Pulmonary effort is normal. No respiratory distress.     Breath sounds: No stridor. No wheezing, rhonchi or rales.  Chest:     Chest wall: No tenderness.  Abdominal:     General: Abdomen is flat. Bowel sounds are normal. There is no distension.     Palpations: Abdomen is soft. There is no mass.     Tenderness: There is no abdominal tenderness. There is no guarding or rebound.     Hernia: No hernia is present.  Musculoskeletal:     Cervical back: Normal range of motion and neck supple.     Right lower leg: No edema.     Left lower leg: No edema.  Lymphadenopathy:     Cervical: No cervical adenopathy.  Skin:    Coloration: Skin is not jaundiced.     Findings: No bruising, erythema, lesion or rash.  Neurological:     General: No focal deficit present.     Mental Status: He is alert and oriented to person, place, and time.     Cranial Nerves: No cranial nerve deficit.     Sensory: No sensory deficit.     Motor: No weakness.     Coordination: Coordination normal.     Gait: Gait normal.     Deep Tendon Reflexes: Reflexes normal.  Psychiatric:        Mood and Affect: Mood normal.        Behavior: Behavior normal.        Thought Content: Thought content normal.        Judgment: Judgment normal.       Assessment & Pl  Uncontrolled type 2 diabetes mellitus with hyperosmolarity without coma, without long-term  current use of insulin  (HCC) - Plan: Hemoglobin A1c, CBC with Differential/Platelet, COMPLETE METABOLIC PANEL WITH GFR, Lipid panel, Microalbumin/Creatinine Ratio, Urine  Chronic diastolic heart failure (HCC)  Paroxysmal atrial fibrillation (HCC)  Chronic obstructive pulmonary disease, unspecified COPD type (HCC)  Stage 3 chronic kidney disease, unspecified whether stage 3a or 3b CKD (HCC) Blood pressure today controlled at 120/64.  Patient is in normal sinus rhythm.  He has a pacemaker.  He is requesting a letter to the [facility] stating that he is unable to wear a seatbelt.  The shoulder strap of the seatbelt presses against his pacemaker.  This causes him significant pain.  He is requesting that he be allowed not to wear a seatbelt.  I feel that this is his decision.  I did write a letter on his behalf for the [facility]  I will check an A1c today.  If his A1c is less than 6.5, I would reduce his dose of Lantus  to  avoid hypoglycemia.  Continue Jardiance , metformin , and Ozempic  as they seem to be working very well.  Patient requires oxygen  2 L via nasal cannula at night while sleeping.  He had a sleep study in August 2023 that showed desaturations at night as documented above in the history of present illness

## 2023-10-05 LAB — CBC WITH DIFFERENTIAL/PLATELET
Absolute Lymphocytes: 2411 {cells}/uL (ref 850–3900)
Absolute Monocytes: 918 {cells}/uL (ref 200–950)
Basophils Absolute: 69 {cells}/uL (ref 0–200)
Basophils Relative: 0.5 %
Eosinophils Absolute: 206 {cells}/uL (ref 15–500)
Eosinophils Relative: 1.5 %
HCT: 46.7 % (ref 38.5–50.0)
Hemoglobin: 15.5 g/dL (ref 13.2–17.1)
MCH: 31.5 pg (ref 27.0–33.0)
MCHC: 33.2 g/dL (ref 32.0–36.0)
MCV: 94.9 fL (ref 80.0–100.0)
MPV: 11.5 fL (ref 7.5–12.5)
Monocytes Relative: 6.7 %
Neutro Abs: 10097 {cells}/uL — ABNORMAL HIGH (ref 1500–7800)
Neutrophils Relative %: 73.7 %
Platelets: 206 10*3/uL (ref 140–400)
RBC: 4.92 10*6/uL (ref 4.20–5.80)
RDW: 12.1 % (ref 11.0–15.0)
Total Lymphocyte: 17.6 %
WBC: 13.7 10*3/uL — ABNORMAL HIGH (ref 3.8–10.8)

## 2023-10-05 LAB — COMPLETE METABOLIC PANEL WITH GFR
AG Ratio: 1.3 (calc) (ref 1.0–2.5)
ALT: 9 U/L (ref 9–46)
AST: 13 U/L (ref 10–35)
Albumin: 4.4 g/dL (ref 3.6–5.1)
Alkaline phosphatase (APISO): 70 U/L (ref 35–144)
BUN: 15 mg/dL (ref 7–25)
CO2: 27 mmol/L (ref 20–32)
Calcium: 9.5 mg/dL (ref 8.6–10.3)
Chloride: 96 mmol/L — ABNORMAL LOW (ref 98–110)
Creat: 1.11 mg/dL (ref 0.70–1.28)
Globulin: 3.3 g/dL (ref 1.9–3.7)
Glucose, Bld: 236 mg/dL — ABNORMAL HIGH (ref 65–99)
Potassium: 3.9 mmol/L (ref 3.5–5.3)
Sodium: 137 mmol/L (ref 135–146)
Total Bilirubin: 0.5 mg/dL (ref 0.2–1.2)
Total Protein: 7.7 g/dL (ref 6.1–8.1)
eGFR: 71 mL/min/{1.73_m2} (ref >=60)

## 2023-10-05 LAB — MICROALBUMIN / CREATININE URINE RATIO
Creatinine, Urine: 34 mg/dL (ref 20–320)
Microalb Creat Ratio: 32 mg/g{creat} — ABNORMAL HIGH (ref <30)
Microalb, Ur: 1.1 mg/dL

## 2023-10-05 LAB — HEMOGLOBIN A1C
Hgb A1c MFr Bld: 8.4 %{Hb} — ABNORMAL HIGH (ref <5.7)
Mean Plasma Glucose: 194 mg/dL
eAG (mmol/L): 10.8 mmol/L

## 2023-10-05 LAB — LIPID PANEL
Cholesterol: 140 mg/dL (ref <200)
HDL: 51 mg/dL (ref >=40)
LDL Cholesterol (Calc): 69 mg/dL
Non-HDL Cholesterol (Calc): 89 mg/dL (ref <130)
Total CHOL/HDL Ratio: 2.7 (calc) (ref <5.0)
Triglycerides: 121 mg/dL (ref <150)

## 2023-10-08 ENCOUNTER — Encounter: Payer: Self-pay | Admitting: Internal Medicine

## 2023-10-08 ENCOUNTER — Ambulatory Visit: Payer: Medicare Other | Attending: Internal Medicine | Admitting: Internal Medicine

## 2023-10-08 VITALS — BP 130/64 | HR 66 | Ht 73.0 in | Wt 256.2 lb

## 2023-10-08 DIAGNOSIS — I480 Paroxysmal atrial fibrillation: Secondary | ICD-10-CM | POA: Diagnosis not present

## 2023-10-08 NOTE — Progress Notes (Signed)
 Cardiology Office Note  Date: 10/08/2023   ID: Steven Henry, DOB [birth year], MRN [id number]  PCP:  Duanne Butler DASEN, MD  Cardiologist:  Fredrica Capano P Kambry Takacs, MD Electrophysiologist:  None   History of Present Illness: Steven Henry is a 73 y.o. 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  Overall doing great, no symptoms.  No DOE, orthopnea, PND, leg swelling, angina.  No dizziness, syncope, palpitations.  Compliant with medications and has no side effects.  Currently on flecainide  for A-fib.  Managed by EP.  EKG today showed AV dual paced rhythm.  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 Wonda and Dr. Lucas   Severe aortic stenosis    Sleep apnea    not on CPAP     Past Surgical History:  Procedure Laterality Date   CARDIOVERSION N/A 11/05/2022   Procedure: CARDIOVERSION;  Surgeon: Alvan Ronal FORBES, MD;  Location: [facility] ENDOSCOPY;  Service: Cardiovascular;  Laterality: N/A;   CARDIOVERSION N/A 01/09/2023   Procedure: CARDIOVERSION;  Surgeon: Jeffrie Oneil BROCKS, MD;  Location: [facility] INVASIVE CV LAB;  Service: Cardiovascular;  Laterality: N/A;   COLONOSCOPY N/A 09/16/2015   Procedure: COLONOSCOPY;  Surgeon: Lamar CHRISTELLA Hollingshead, MD;  Location: [facility] ENDO SUITE;  Service: Endoscopy;  Laterality: N/A;  8:15 AM - pt has c pap   ENDOVENOUS ABLATION SAPHENOUS VEIN W/ LASER Right 08/26/2017   endovenous laser ablation  right greater saphenous vein by Lynwood Collum MD    INTRAOPERATIVE TRANSTHORACIC ECHOCARDIOGRAM N/A 06/25/2023   Procedure: INTRAOPERATIVE TRANSTHORACIC ECHOCARDIOGRAM;  Surgeon: Wonda Sharper, MD;  Location: [facility] INVASIVE CV LAB;  Service: Open Heart Surgery;  Laterality: N/A;   PACEMAKER IMPLANT N/A 09/14/2022   Procedure: PACEMAKER IMPLANT;  Surgeon: Fernande Elspeth BROCKS, MD;  Location: [facility] INVASIVE CV LAB;  Service: Cardiovascular;  Laterality: N/A;   RIGHT/LEFT HEART CATH AND CORONARY ANGIOGRAPHY N/A 02/11/2023   Procedure: RIGHT/LEFT HEART CATH AND CORONARY ANGIOGRAPHY;  Surgeon: Wonda Sharper, MD;  Location: [facility] INVASIVE CV LAB;  Service: Cardiovascular;  Laterality: N/A;   stab phlebectomy  Right 01/07/2018   stab phlebectomy 10-20 incisions right leg by Lynwood Collum MD    TRANSCATHETER AORTIC VALVE REPLACEMENT, TRANSFEMORAL Right 06/25/2023   Procedure: Transcatheter Aortic Valve Replacement, Transfemoral;  Surgeon: Wonda Sharper, MD;  Location: [facility] INVASIVE CV LAB;  Service: Open Heart Surgery;  Laterality: Right;    Current Outpatient Medications  Medication Sig Dispense Refill   Accu-Chek FastClix Lancets MISC Check BS QAM - DX: E11.9 100 each 3   albuterol  (VENTOLIN  HFA) 108 (90 Base) MCG/ACT inhaler Inhale 2 puffs into the lungs every 6 (six) hours as needed for wheezing or shortness of breath. 8 g 0   apixaban  (ELIQUIS ) 5 MG TABS tablet TAKE 1 TABLET BY MOUTH TWICE DAILY 180 tablet 0   atorvastatin  (LIPITOR) 40  MG tablet TAKE 1 TABLET BY MOUTH DAILY 90 tablet 1   bisoprolol  (ZEBETA ) 5 MG tablet Take 1 tablet (5 mg total) by mouth daily. 90 tablet 3   Blood Glucose Monitoring Suppl (ONE TOUCH ULTRA 2) w/Device KIT USETO CHECK BLOOD SUGAR EVERY MORNING 1 kit 0   budesonide -formoterol  (SYMBICORT ) 160-4.5 MCG/ACT inhaler Inhale 2 puffs into the lungs in the morning and at bedtime. (Patient taking differently: Inhale 2 puffs into the lungs 2 (two) times daily as needed (shortness of breath).)  1 each 12   Cholecalciferol (VITAMIN D-3) 125 MCG (5000 UT) TABS Take 5,000 Units by mouth 2 (two) times daily.     Continuous Blood Gluc Receiver (FREESTYLE LIBRE 2 READER) DEVI For continuous blood glucose monitoring 1 each 1   Continuous Glucose Sensor (FREESTYLE LIBRE 2 SENSOR) MISC APPLY 1 SENSOR ON THE SKIN EVERY 14 DAYS, USE AS DIRECTED TO CHECK BLOOD SUGAR 1 each 3   empagliflozin  (JARDIANCE ) 25 MG TABS tablet TAKE 1 TABLET BY MOUTH DAILY BEFORE breakfast 90 tablet 0   flecainide  (TAMBOCOR ) 100 MG tablet Take 1 tablet (100 mg total) by mouth 2 (two) times daily. 180 tablet 3   furosemide  (LASIX ) 40 MG tablet Take 2 tablets (80 mg total) by mouth 2 (two) times daily. 360 tablet 3   insulin  glargine (LANTUS ) 100 UNIT/ML injection Inject 0.25 mLs (25 Units total) into the skin daily. PLEASE INCREASE INSULIN  BY 1 UNIT DAILY UNTIL FBG IS <130, THEN HOLD AT THAT DOSE 30 mL 5   ipratropium-albuterol  (DUONEB) 0.5-2.5 (3) MG/3ML SOLN Take 3 mLs by nebulization 3 (three) times daily. (Patient taking differently: Take 3 mLs by nebulization every 4 (four) hours as needed (shortness of breath).) 360 mL 1   Lancets Misc. (ACCU-CHEK FASTCLIX LANCET) KIT Check BS QAM - DX: E11.9 1 kit 0   metFORMIN  (GLUCOPHAGE ) 1000 MG tablet TAKE 1 TABLET BY MOUTH TWICE DAILY WITH a meal 180 tablet 3   Multiple Vitamin (MULTIVITAMIN) capsule Take 1 capsule by mouth in the morning. Mens one a day     ONETOUCH ULTRA test strip USE TO CHECK BLOOD SUGAR EVERY MORNING 100 strip 12   potassium chloride  SA (KLOR-CON  M20) 20 MEQ tablet Take 1 tablet (20 mEq total) by mouth 2 (two) times daily. 180 tablet 3   Semaglutide , 2 MG/DOSE, (OZEMPIC , 2 MG/DOSE,) 8 MG/3ML SOPN Inject 2 mg into the skin once a week. 3 mL 1   spironolactone  (ALDACTONE ) 25 MG tablet Take 1 tablet (25 mg total) by mouth daily. 90 tablet 3   Zinc 30 MG CAPS Take 30 mg by mouth daily.     No current facility-administered medications for this visit.    Allergies:  Patient has no known allergies.   Social History: The patient  reports that he has been smoking cigarettes. He started smoking about 65 years ago. He has a 98.3 pack-year smoking history. He has never used smokeless tobacco. He reports current alcohol use of about 1.0 standard drink of alcohol per week. He reports that he does not use drugs.   Family History: The patient's family history includes Colon cancer in his mother; Diabetes in his mother, sister, sister, and sister; Heart disease in his mother.   ROS:  Please see the history of present illness. Otherwise, complete review of systems is positive for none  All other systems are reviewed and negative.   Physical Exam: VS:  BP 130/64   Pulse 66   Ht 6' 1 (1.854  m)   Wt 256 lb 3.2 oz (116.2 kg)   SpO2 90%   BMI 33.80 kg/m , BMI Body mass index is 33.8 kg/m.  Wt Readings from Last 3 Encounters:  10/08/23 256 lb 3.2 oz (116.2 kg)  10/03/23 251 lb 9.6 oz (114.1 kg)  08/30/23 252 lb (114.3 kg)    General: Patient appears comfortable at rest. HEENT: Conjunctiva and lids normal, oropharynx clear with moist mucosa. Neck: Supple, no elevated JVP or carotid bruits, no thyromegaly. Lungs: Clear to auscultation, nonlabored breathing at rest. Cardiac: Regular rate and rhythm, no S3 or significant systolic murmur, no pericardial rub. Abdomen: Soft, nontender, no hepatomegaly, bowel sounds present, no guarding or rebound. Extremities: No pitting edema, distal pulses 2+. Skin: Warm and dry. Musculoskeletal: No kyphosis. Neuropsychiatric: Alert and oriented x3, affect grossly appropriate.  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  Compensated, no symptoms.  Continue Eliquis  5 mg twice daily (not on aspirin  due to Eliquis  use).  Needs dental cleanings twice per year and SBE prophylaxis prior to dental procedures. -EKG today showed AV dual paced rhythm.  No symptoms of palpitations, SOB or fatigue.  On flecainide  100 mg twice daily, managed by EP.  Continue bisoprolol  5 mg once daily.  Encouraged patient to make an appointment with the EP to discuss about coming off flecainide , if necessary.  Otherwise continue Eliquis  5 mg twice daily. -Compensated, no symptoms.  Continue p.o. Lasix  80 mg twice daily, Jardiance  25 mg once daily (mainly for diabetes) and spironolactone  25 mg once daily.       Medication Adjustments/Labs and Tests Ordered: Current medicines are reviewed at length with the patient today.  Concerns regarding medicines are outlined above.    Disposition:  Follow up  6 months  Signed Arlissa Monteverde Priya Avo Schlachter, MD, 10/08/2023 12:50 PM    [facility] at [facility] [address] [place], [place], [place] [address]

## 2023-10-08 NOTE — Patient Instructions (Signed)

## 2023-10-12 ENCOUNTER — Other Ambulatory Visit: Payer: Self-pay | Admitting: Internal Medicine

## 2023-10-23 ENCOUNTER — Other Ambulatory Visit: Payer: Self-pay | Admitting: Family Medicine

## 2023-10-23 DIAGNOSIS — E1100 Type 2 diabetes mellitus with hyperosmolarity without nonketotic hyperglycemic-hyperosmolar coma (NKHHC): Secondary | ICD-10-CM

## 2023-10-23 DIAGNOSIS — N183 Chronic kidney disease, stage 3 unspecified: Secondary | ICD-10-CM

## 2023-10-24 MED ORDER — EMPAGLIFLOZIN 25 MG PO TABS: 25.0000 mg | ORAL_TABLET | Freq: Every day | ORAL | 0 refills | Status: DC

## 2023-10-25 ENCOUNTER — Other Ambulatory Visit: Payer: Self-pay | Admitting: Family Medicine

## 2023-10-25 DIAGNOSIS — E1100 Type 2 diabetes mellitus with hyperosmolarity without nonketotic hyperglycemic-hyperosmolar coma (NKHHC): Secondary | ICD-10-CM

## 2023-10-25 DIAGNOSIS — I5032 Chronic diastolic (congestive) heart failure: Secondary | ICD-10-CM

## 2023-10-29 NOTE — Progress Notes (Signed)
 Remote pacemaker transmission.

## 2023-11-21 ENCOUNTER — Encounter: Payer: Self-pay | Admitting: *Deleted

## 2023-11-24 ENCOUNTER — Other Ambulatory Visit: Payer: Self-pay | Admitting: Family Medicine

## 2023-11-24 DIAGNOSIS — E7800 Pure hypercholesterolemia, unspecified: Secondary | ICD-10-CM

## 2023-11-24 DIAGNOSIS — I480 Paroxysmal atrial fibrillation: Secondary | ICD-10-CM

## 2023-12-17 ENCOUNTER — Ambulatory Visit (INDEPENDENT_AMBULATORY_CARE_PROVIDER_SITE_OTHER): Payer: Medicare Other

## 2023-12-17 DIAGNOSIS — R001 Bradycardia, unspecified: Secondary | ICD-10-CM | POA: Diagnosis not present

## 2023-12-17 LAB — CUP PACEART REMOTE DEVICE CHECK
Battery Remaining Longevity: 137 mo
Battery Voltage: 3.04 V
Brady Statistic AP VP Percent: 79.78 %
Brady Statistic AP VS Percent: 0 %
Brady Statistic AS VP Percent: 20.21 %
Brady Statistic AS VS Percent: 0.01 %
Brady Statistic RA Percent Paced: 79.58 %
Brady Statistic RV Percent Paced: 99.99 %
Date Time Interrogation Session: 20250324203546
Implantable Lead Connection Status: 753985
Implantable Lead Connection Status: 753985
Implantable Lead Implant Date: 20231222
Implantable Lead Implant Date: 20231222
Implantable Lead Location: 753859
Implantable Lead Location: 753860
Implantable Lead Model: 3830
Implantable Lead Model: 5076
Implantable Pulse Generator Implant Date: 20231222
Lead Channel Impedance Value: 380 Ohm
Lead Channel Impedance Value: 418 Ohm
Lead Channel Impedance Value: 437 Ohm
Lead Channel Impedance Value: 570 Ohm
Lead Channel Pacing Threshold Amplitude: 0.875 V
Lead Channel Pacing Threshold Amplitude: 1 V
Lead Channel Pacing Threshold Pulse Width: 0.4 ms
Lead Channel Pacing Threshold Pulse Width: 0.4 ms
Lead Channel Sensing Intrinsic Amplitude: 17.625 mV
Lead Channel Sensing Intrinsic Amplitude: 19.5 mV
Lead Channel Sensing Intrinsic Amplitude: 2.75 mV
Lead Channel Sensing Intrinsic Amplitude: 2.75 mV
Lead Channel Setting Pacing Amplitude: 1.75 V
Lead Channel Setting Pacing Amplitude: 2 V
Lead Channel Setting Pacing Pulse Width: 0.4 ms
Lead Channel Setting Sensing Sensitivity: 0.9 mV
Zone Setting Status: 755011

## 2023-12-19 ENCOUNTER — Telehealth: Payer: Self-pay

## 2023-12-19 ENCOUNTER — Other Ambulatory Visit: Payer: Self-pay

## 2023-12-19 DIAGNOSIS — E1100 Type 2 diabetes mellitus with hyperosmolarity without nonketotic hyperglycemic-hyperosmolar coma (NKHHC): Secondary | ICD-10-CM

## 2023-12-19 MED ORDER — METFORMIN HCL 1000 MG PO TABS
1000.0000 mg | ORAL_TABLET | Freq: Two times a day (BID) | ORAL | 0 refills | Status: DC
Start: 2023-12-19 — End: 2024-03-16

## 2023-12-19 NOTE — Telephone Encounter (Signed)
 Copied from CRM [id number]. Topic: Appointments - Scheduling Inquiry for Clinic >> Dec 19, 2023 10:00 AM Fonda Kinder J wrote: Reason for CRM: Pt called in to schedule an office visit for his metformin refill, he was not able to make the next available which was today at 12:15 pm, and the schedule is showing the next available 04/07 but he states he is completely out of medication and needs to be seen before then. Please advise

## 2023-12-22 ENCOUNTER — Other Ambulatory Visit: Payer: Self-pay | Admitting: Family Medicine

## 2023-12-22 DIAGNOSIS — E1100 Type 2 diabetes mellitus with hyperosmolarity without nonketotic hyperglycemic-hyperosmolar coma (NKHHC): Secondary | ICD-10-CM

## 2023-12-22 DIAGNOSIS — I5032 Chronic diastolic (congestive) heart failure: Secondary | ICD-10-CM

## 2023-12-24 NOTE — Telephone Encounter (Signed)
 Requested Prescriptions  Pending Prescriptions Disp Refills   OZEMPIC, 2 MG/DOSE, 8 MG/3ML SOPN [Pharmacy Med Name: Ozempic 2 mg/dose (8 mg/3 mL) subcutaneous pen injector] 3 mL 1    Sig: INJECT 2 MG into THE SKIN ONCE WEEKLY     Endocrinology:  Diabetes - GLP-1 Receptor Agonists - semaglutide Failed - 12/24/2023  1:37 PM      Failed - HBA1C in normal range and within 180 days    Hgb A1c MFr Bld  Date Value Ref Range Status  10/03/2023 8.4 (H) <5.7 % of total Hgb Final    Comment:    For someone without known diabetes, a hemoglobin A1c value of 6.5% or greater indicates that they may have  diabetes and this should be confirmed with a follow-up  test. . For someone with known diabetes, a value <7% indicates  that their diabetes is well controlled and a value  greater than or equal to 7% indicates suboptimal  control. A1c targets should be individualized based on  duration of diabetes, age, comorbid conditions, and  other considerations. . Currently, no consensus exists regarding use of hemoglobin A1c for diagnosis of diabetes for children. .          Passed - Cr in normal range and within 360 days    Creat  Date Value Ref Range Status  10/03/2023 1.11 0.70 - 1.28 mg/dL Final   Creatinine, Urine  Date Value Ref Range Status  10/03/2023 34 20 - 320 mg/dL Final         Passed - Valid encounter within last 6 months    Recent Outpatient Visits           2 months ago Uncontrolled type 2 diabetes mellitus with hyperosmolarity without coma, without long-term current use of insulin (HCC)   [place] [facility] Donita Brooks, MD   1 year ago Uncontrolled type 2 diabetes mellitus with hyperosmolarity without coma, without long-term current use of insulin [facility])   [place] [facility] Donita Brooks, MD   1 year ago Acute heart failure with preserved ejection fraction [facility])   [place] [facility] Donita Brooks, MD    1 year ago Cellulitis of right lower extremity   [place] [facility] Donita Brooks, MD   1 year ago Type 2 diabetes mellitus with hyperglycemia, with long-term current use of insulin [facility])   [place] [facility] Pickard, Priscille Heidelberg, MD       Future Appointments             In 5 months [place] [facility]

## 2023-12-29 ENCOUNTER — Other Ambulatory Visit: Payer: Self-pay | Admitting: Family Medicine

## 2023-12-29 DIAGNOSIS — E1100 Type 2 diabetes mellitus with hyperosmolarity without nonketotic hyperglycemic-hyperosmolar coma (NKHHC): Secondary | ICD-10-CM

## 2023-12-29 DIAGNOSIS — N183 Chronic kidney disease, stage 3 unspecified: Secondary | ICD-10-CM

## 2023-12-30 ENCOUNTER — Ambulatory Visit: Admitting: Family Medicine

## 2023-12-30 VITALS — BP 132/64 | HR 85 | Temp 97.9°F | Ht 73.0 in | Wt 252.8 lb

## 2023-12-30 DIAGNOSIS — Z7985 Long-term (current) use of injectable non-insulin antidiabetic drugs: Secondary | ICD-10-CM | POA: Diagnosis not present

## 2023-12-30 DIAGNOSIS — E1100 Type 2 diabetes mellitus with hyperosmolarity without nonketotic hyperglycemic-hyperosmolar coma (NKHHC): Secondary | ICD-10-CM

## 2023-12-30 NOTE — Progress Notes (Signed)
 Wt Readings from Last 3 Encounters:  12/30/23 252 lb 12.8 oz (114.7 kg)  10/08/23 256 lb 3.2 oz (116.2 kg)  10/03/23 251 lb 9.6 oz (114.1 kg)     Subjective:   Patient is a 73 year old Caucasian gentleman with a history of atrial fibrillation status post cardioversion x 2.  Currently maintaining normal sinus rhythm using flecainide.  He also has a history of congestive heart failure.  Recently was admitted to the hospital for TAVR for severe aortic stenosis.  He also has a history of COPD due to longstanding tobacco abuse as well as insulin-dependent diabetes mellitus.  In January, the patient's hemoglobin A1c was 8.4.  We recommended increasing his Lantus to 34 units a day.  Patient states that he has been doing that consistently.  He reports blood sugars ranging from 100-180.  However these are random sugars throughout the day.  He denies any sugars greater than 200 glycemic episodes.  He is in normal sinus rhythm today.  He denies any shortness of breath or chest pain after repair of his valve.  He continues to smoke however. 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 Excell Seltzer and Dr. Laneta Simmers   Severe aortic stenosis    Sleep apnea    not on CPAP    Past Surgical History:  Procedure Laterality Date   CARDIOVERSION N/A 11/05/2022   Procedure: CARDIOVERSION;  Surgeon: Maisie Fus, MD;  Location: [facility] ENDOSCOPY;  Service: Cardiovascular;  Laterality: N/A;   CARDIOVERSION N/A 01/09/2023   Procedure: CARDIOVERSION;  Surgeon: Jake Bathe, MD;  Location: [facility] INVASIVE CV LAB;  Service:  Cardiovascular;  Laterality: N/A;   COLONOSCOPY N/A 09/16/2015   Procedure: COLONOSCOPY;  Surgeon: Corbin Ade, MD;  [facility] ENDO SUITE;  Service: Endoscopy;  Laterality: N/A;  8:15 AM - pt has c pap   ENDOVENOUS ABLATION SAPHENOUS VEIN W/ LASER Right 08/26/2017   endovenous laser ablation right greater saphenous vein by Josephina Gip MD    INTRAOPERATIVE TRANSTHORACIC ECHOCARDIOGRAM N/A 06/25/2023   Procedure: INTRAOPERATIVE TRANSTHORACIC ECHOCARDIOGRAM;  Surgeon: [name], MD;  Location: [facility] INVASIVE CV LAB;  Service: Open Heart Surgery;  Laterality: N/A;   PACEMAKER IMPLANT N/A 09/14/2022   Procedure: PACEMAKER IMPLANT;  Surgeon: Duke Salvia, MD;  Location: [facility] INVASIVE CV LAB;  Service: Cardiovascular;  Laterality: N/A;   RIGHT/LEFT HEART CATH AND CORONARY ANGIOGRAPHY N/A 02/11/2023   Procedure: RIGHT/LEFT HEART CATH AND CORONARY ANGIOGRAPHY;  Surgeon: [name], MD;  Location: [facility] INVASIVE CV LAB;  Service: Cardiovascular;  Laterality: N/A;   stab phlebectomy  Right 01/07/2018   stab phlebectomy 10-20 incisions right leg by Josephina Gip MD    TRANSCATHETER AORTIC VALVE REPLACEMENT, TRANSFEMORAL Right 06/25/2023   Procedure: Transcatheter Aortic Valve Replacement, Transfemoral;  Surgeon: [name], MD;  Location: [facility] INVASIVE CV LAB;  Service: Open Heart Surgery;  Laterality: Right;   Current Outpatient Medications on File Prior to Visit  Medication Sig Dispense Refill   Accu-Chek FastClix Lancets MISC Check BS QAM -  DX: E11.9 100 each 3   albuterol (VENTOLIN HFA) 108 (90 Base) MCG/ACT inhaler Inhale 2 puffs into the lungs every 6 (six) hours as needed for wheezing or shortness of breath. 8 g 0   atorvastatin (LIPITOR) 40 MG tablet TAKE 1 TABLET BY MOUTH DAILY 90 tablet 1   bisoprolol (ZEBETA) 5 MG tablet Take 1 tablet (5 mg total) by mouth daily. 90 tablet 3   Blood Glucose Monitoring Suppl (ONE TOUCH ULTRA 2) w/Device KIT USETO CHECK BLOOD SUGAR EVERY MORNING 1 kit  0   budesonide-formoterol (SYMBICORT) 160-4.5 MCG/ACT inhaler Inhale 2 puffs into the lungs in the morning and at bedtime. (Patient taking differently: Inhale 2 puffs into the lungs 2 (two) times daily as needed (shortness of breath).) 1 each 12   Cholecalciferol (VITAMIN D-3) 125 MCG (5000 UT) TABS Take 5,000 Units by mouth 2 (two) times daily.     Continuous Blood Gluc Receiver (FREESTYLE LIBRE 2 READER) DEVI For continuous blood glucose monitoring 1 each 1   Continuous Glucose Sensor (FREESTYLE LIBRE 2 SENSOR) MISC APPLY 1 SENSOR ON THE SKIN EVERY 14 DAYS, USE AS DIRECTED TO CHECK BLOOD SUGAR 1 each 3   ELIQUIS 5 MG TABS tablet TAKE 1 TABLET BY MOUTH TWICE DAILY 180 tablet 1   empagliflozin (JARDIANCE) 25 MG TABS tablet Take 1 tablet (25 mg total) by mouth daily before breakfast. 90 tablet 0   flecainide (TAMBOCOR) 100 MG tablet TAKE 1 TABLET BY MOUTH TWICE DAILY 180 tablet 0   furosemide (LASIX) 40 MG tablet Take 2 tablets (80 mg total) by mouth 2 (two) times daily. 360 tablet 3   insulin glargine (LANTUS) 100 UNIT/ML injection Inject 0.25 mLs (25 Units total) into the skin daily. PLEASE INCREASE INSULIN BY 1 UNIT DAILY UNTIL FBG IS <130, THEN HOLD AT THAT DOSE 30 mL 5   ipratropium-albuterol (DUONEB) 0.5-2.5 (3) MG/3ML SOLN Take 3 mLs by nebulization 3 (three) times daily. (Patient taking differently: Take 3 mLs by nebulization every 4 (four) hours as needed (shortness of breath).) 360 mL 1   Lancets Misc. (ACCU-CHEK FASTCLIX LANCET) KIT Check BS QAM - DX: E11.9 1 kit 0   metFORMIN (GLUCOPHAGE) 1000 MG tablet Take 1 tablet (1,000 mg total) by mouth 2 (two) times daily with a meal. 180 tablet 0   Multiple Vitamin (MULTIVITAMIN) capsule Take 1 capsule by mouth in the morning. Mens one a day     ONETOUCH ULTRA test strip USE TO CHECK BLOOD SUGAR EVERY MORNING 100 strip 12   OZEMPIC, 2 MG/DOSE, 8 MG/3ML SOPN INJECT 2 MG into THE SKIN ONCE WEEKLY 3 mL 1   potassium chloride SA (KLOR-CON M20) 20 MEQ  tablet Take 1 tablet (20 mEq total) by mouth 2 (two) times daily. 180 tablet 3   spironolactone (ALDACTONE) 25 MG tablet Take 1 tablet (25 mg total) by mouth daily. 90 tablet 3   Zinc 30 MG CAPS Take 30 mg by mouth daily.     losartan (COZAAR) 50 MG tablet Take 50 mg by mouth daily. (Patient not taking: Reported on 12/30/2023)     No current facility-administered medications on file prior to visit.   No Known Allergies Social History   Socioeconomic History   Marital status: Divorced    Spouse name: Not on file   Number of children: 2   Years of education: Not on file   Highest education level: 12th grade  Occupational History   Occupation: Self Employed  Tobacco Use  Smoking status: Some Days    Current packs/day: 1.50    Average packs/day: 1.5 packs/day for 65.7 years (98.6 ttl pk-yrs)    Types: Cigarettes    Start date: 04/04/1958   Smokeless tobacco: Never   Tobacco comments:    Quit smoking day of DCCV 11/07/22    Started back due to stress - 1/2 pack per day - trying to cut back down. - 12/17/2022  Vaping Use   Vaping status: Never Used  Substance and Sexual Activity   Alcohol use: Yes    Alcohol/week: 1.0 standard drink of alcohol    Types: 1 Cans of beer per week    Comment: 1-2  beer once a month 10/12/22   Drug use: No   Sexual activity: Not Currently    Comment: divorced, 2 daughters.  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  Other Topics Concern   Not on file  Social History Narrative   Pt states he usually smokes after eating or when stressed   Social Drivers of Health   Financial Resource Strain: Low Risk  (12/30/2023)   Overall Financial Resource Strain (CARDIA)    Difficulty of Paying Living Expenses: Not very hard  Food Insecurity: No Food Insecurity (12/30/2023)   Hunger Vital Sign    Worried About Running Out of Food in the Last Year: Never true    Ran Out of Food in the Last Year: Never true  Transportation Needs: No Transportation Needs (12/30/2023)    PRAPARE - Administrator, Civil Service (Medical): No    Lack of Transportation (Non-Medical): No  Physical Activity: Insufficiently Active (12/30/2023)   Exercise Vital Sign    Days of Exercise per Week: 2 days    Minutes of Exercise per Session: 20 min  Stress: No Stress Concern Present (12/30/2023)   [employer] of Occupational Health - Occupational Stress Questionnaire    Feeling of Stress : Not at all  Social Connections: Moderately Isolated (12/30/2023)   Social Connection and Isolation Panel [NHANES]    Frequency of Communication with Friends and Family: More than three times a week    Frequency of Social Gatherings with Friends and Family: More than three times a week    Attends Religious Services: More than 4 times per year    Active Member of [employer] or Organizations: No    Attends Banker Meetings: Never    Marital Status: Divorced  Catering manager Violence: Not At Risk (09/19/2023)   Humiliation, Afraid, Rape, and Kick questionnaire    Fear of Current or Ex-Partner: No    Emotionally Abused: No    Physically Abused: No    Sexually Abused: No     Review of Systems  All other systems reviewed and are negative.      Objective:   Physical Exam Vitals reviewed.  Constitutional:      General: He is not in acute distress.    Appearance: Normal appearance. He is obese. He is not ill-appearing or toxic-appearing.  HENT:     Head: Normocephalic and atraumatic.  Neck:     Vascular: No carotid bruit.  Cardiovascular:     Rate and Rhythm: Normal rate and regular rhythm.     Pulses: Normal pulses.     Heart sounds: Normal heart sounds. No murmur heard.    No friction rub. No gallop.  Pulmonary:     Effort: Pulmonary effort is normal. No respiratory distress.     Breath sounds: No stridor. No  wheezing, rhonchi or rales.  Chest:     Chest wall: No tenderness.  Abdominal:     General: Abdomen is flat. Bowel sounds are normal. There is no  distension.     Palpations: Abdomen is soft. There is no mass.     Tenderness: There is no abdominal tenderness. There is no guarding or rebound.     Hernia: No hernia is present.  Musculoskeletal:     Cervical back: Neck supple.     Right lower leg: No edema.     Left lower leg: No edema.  Lymphadenopathy:     Cervical: No cervical adenopathy.  Skin:    Coloration: Skin is not jaundiced.     Findings: No bruising, erythema, lesion or rash.  Neurological:     General: No focal deficit present.     Mental Status: He is alert and oriented to person, place, and time.     Cranial Nerves: No cranial nerve deficit.     Sensory: No sensory deficit.     Motor: No weakness.     Coordination: Coordination normal.     Gait: Gait normal.     Deep Tendon Reflexes: Reflexes normal.  Psychiatric:        Mood and Affect: Mood normal.        Behavior: Behavior normal.        Thought Content: Thought content normal.        Judgment: Judgment normal.       Assessment & Pl  Uncontrolled type 2 diabetes mellitus with hyperosmolarity without coma, without long-term current use of insulin (HCC) - Plan: Hemoglobin A1c, CBC with Differential/Platelet, COMPLETE METABOLIC PANEL WITHOUT GFR, Lipid panel Blood pressure is well-controlled.  Patient is in normal sinus rhythm and his heart rate is controlled.  His blood sugars sound reasonable.  Check a hemoglobin A1c along with a CBC and CMP and a fasting lipid panel.  I like to see his LDL cholesterol less than 70.  I like to see his A1c under 7.  Continue to encourage smoking cessation.

## 2023-12-30 NOTE — Telephone Encounter (Signed)
 Requested Prescriptions  Pending Prescriptions Disp Refills   empagliflozin (JARDIANCE) 25 MG TABS tablet [Pharmacy Med Name: Jardiance 25 mg tablet] 90 tablet 0    Sig: TAKE 1 TABLET BY MOUTH DAILY BEFORE BREAKFAST     Endocrinology:  Diabetes - SGLT2 Inhibitors Failed - 12/30/2023  5:53 PM      Failed - HBA1C is between 0 and 7.9 and within 180 days    Hgb A1c MFr Bld  Date Value Ref Range Status  10/03/2023 8.4 (H) <5.7 % of total Hgb Final    Comment:    For someone without known diabetes, a hemoglobin A1c value of 6.5% or greater indicates that they may have  diabetes and this should be confirmed with a follow-up  test. . For someone with known diabetes, a value <7% indicates  that their diabetes is well controlled and a value  greater than or equal to 7% indicates suboptimal  control. A1c targets should be individualized based on  duration of diabetes, age, comorbid conditions, and  other considerations. . Currently, no consensus exists regarding use of hemoglobin A1c for diagnosis of diabetes for children. .          Passed - Cr in normal range and within 360 days    Creat  Date Value Ref Range Status  10/03/2023 1.11 0.70 - 1.28 mg/dL Final   Creatinine, Urine  Date Value Ref Range Status  10/03/2023 34 20 - 320 mg/dL Final         Passed - eGFR in normal range and within 360 days    GFR, Est African American  Date Value Ref Range Status  08/01/2020 101 > OR = 60 mL/min/1.73m2 Final   GFR, Est Non African American  Date Value Ref Range Status  08/01/2020 87 > OR = 60 mL/min/1.79m2 Final   GFR, Estimated  Date Value Ref Range Status  06/26/2023 >60 >60 mL/min Final    Comment:    (NOTE) Calculated using the CKD-EPI Creatinine Equation (2021)    eGFR  Date Value Ref Range Status  10/03/2023 71 > OR = 60 mL/min/1.80m2 Final  07/05/2023 88 >59 mL/min/1.73 Final         Passed - Valid encounter within last 6 months    Recent Outpatient Visits            Today Uncontrolled type 2 diabetes mellitus with hyperosmolarity without coma, without long-term current use of insulin (HCC)   [place] [facility] Medicine Donita Brooks, MD   2 months ago Uncontrolled type 2 diabetes mellitus with hyperosmolarity without coma, without long-term current use of insulin [facility])   [place] [facility] Donita Brooks, MD   1 year ago Uncontrolled type 2 diabetes mellitus with hyperosmolarity without coma, without long-term current use of insulin [facility])   [place] [facility] Medicine Donita Brooks, MD   1 year ago Acute heart failure with preserved ejection fraction [facility])   [place] [facility] Medicine Donita Brooks, MD   1 year ago Cellulitis of right lower extremity   [place] [facility] Medicine Pickard, Priscille Heidelberg, MD       Future Appointments             In 5 months [place] [facility]

## 2023-12-31 LAB — CBC WITH DIFFERENTIAL/PLATELET
Absolute Lymphocytes: 2475 {cells}/uL (ref 850–3900)
Absolute Monocytes: 1012 {cells}/uL — ABNORMAL HIGH (ref 200–950)
Basophils Absolute: 55 {cells}/uL (ref 0–200)
Basophils Relative: 0.5 %
Eosinophils Absolute: 187 {cells}/uL (ref 15–500)
Eosinophils Relative: 1.7 %
HCT: 49.1 % (ref 38.5–50.0)
Hemoglobin: 16.3 g/dL (ref 13.2–17.1)
MCH: 30.9 pg (ref 27.0–33.0)
MCHC: 33.2 g/dL (ref 32.0–36.0)
MCV: 93 fL (ref 80.0–100.0)
MPV: 11.9 fL (ref 7.5–12.5)
Monocytes Relative: 9.2 %
Neutro Abs: 7271 {cells}/uL (ref 1500–7800)
Neutrophils Relative %: 66.1 %
Platelets: 193 10*3/uL (ref 140–400)
RBC: 5.28 10*6/uL (ref 4.20–5.80)
RDW: 12.4 % (ref 11.0–15.0)
Total Lymphocyte: 22.5 %
WBC: 11 10*3/uL — ABNORMAL HIGH (ref 3.8–10.8)

## 2023-12-31 LAB — COMPLETE METABOLIC PANEL WITHOUT GFR
AG Ratio: 1.3 (calc) (ref 1.0–2.5)
ALT: 9 U/L (ref 9–46)
AST: 16 U/L (ref 10–35)
Albumin: 4.6 g/dL (ref 3.6–5.1)
Alkaline phosphatase (APISO): 65 U/L (ref 35–144)
BUN/Creatinine Ratio: 16 (calc) (ref 6–22)
BUN: 22 mg/dL (ref 7–25)
CO2: 29 mmol/L (ref 20–32)
Calcium: 10 mg/dL (ref 8.6–10.3)
Chloride: 99 mmol/L (ref 98–110)
Creat: 1.35 mg/dL — ABNORMAL HIGH (ref 0.70–1.28)
Globulin: 3.6 g/dL (ref 1.9–3.7)
Glucose, Bld: 170 mg/dL — ABNORMAL HIGH (ref 65–99)
Potassium: 4.4 mmol/L (ref 3.5–5.3)
Sodium: 137 mmol/L (ref 135–146)
Total Bilirubin: 0.4 mg/dL (ref 0.2–1.2)
Total Protein: 8.2 g/dL — ABNORMAL HIGH (ref 6.1–8.1)

## 2023-12-31 LAB — LIPID PANEL
Cholesterol: 147 mg/dL (ref <200)
HDL: 48 mg/dL (ref >=40)
LDL Cholesterol (Calc): 76 mg/dL
Non-HDL Cholesterol (Calc): 99 mg/dL (ref <130)
Total CHOL/HDL Ratio: 3.1 (calc) (ref <5.0)
Triglycerides: 145 mg/dL (ref <150)

## 2023-12-31 LAB — HEMOGLOBIN A1C
Hgb A1c MFr Bld: 8.3 %{Hb} — ABNORMAL HIGH (ref <5.7)
Mean Plasma Glucose: 192 mg/dL
eAG (mmol/L): 10.6 mmol/L

## 2024-01-04 ENCOUNTER — Encounter: Payer: Self-pay | Admitting: Internal Medicine

## 2024-01-20 ENCOUNTER — Other Ambulatory Visit: Payer: Self-pay | Admitting: Family Medicine

## 2024-01-20 DIAGNOSIS — N183 Chronic kidney disease, stage 3 unspecified: Secondary | ICD-10-CM

## 2024-01-20 DIAGNOSIS — E1100 Type 2 diabetes mellitus with hyperosmolarity without nonketotic hyperglycemic-hyperosmolar coma (NKHHC): Secondary | ICD-10-CM

## 2024-01-25 ENCOUNTER — Other Ambulatory Visit: Payer: Self-pay | Admitting: Internal Medicine

## 2024-01-31 NOTE — Progress Notes (Signed)
 Remote pacemaker transmission.

## 2024-02-21 ENCOUNTER — Other Ambulatory Visit: Payer: Self-pay | Admitting: Family Medicine

## 2024-02-21 DIAGNOSIS — E1100 Type 2 diabetes mellitus with hyperosmolarity without nonketotic hyperglycemic-hyperosmolar coma (NKHHC): Secondary | ICD-10-CM

## 2024-02-21 DIAGNOSIS — I5032 Chronic diastolic (congestive) heart failure: Secondary | ICD-10-CM

## 2024-02-22 ENCOUNTER — Other Ambulatory Visit: Payer: Self-pay | Admitting: Family Medicine

## 2024-02-22 NOTE — Telephone Encounter (Signed)
 Requested Prescriptions  Pending Prescriptions Disp Refills   Semaglutide , 2 MG/DOSE, (OZEMPIC , 2 MG/DOSE,) 8 MG/3ML SOPN [Pharmacy Med Name: Ozempic  2 mg/dose (8 mg/3 mL) subcutaneous pen injector] 9 mL 1    Sig: INJECT 2 MG into THE SKIN ONCE WEEKLY     Endocrinology:  Diabetes - GLP-1 Receptor Agonists - semaglutide  Failed - 02/22/2024  3:19 PM      Failed - HBA1C in normal range and within 180 days    Hgb A1c MFr Bld  Date Value Ref Range Status  12/30/2023 8.3 (H) <5.7 % of total Hgb Final    Comment:    For someone without known diabetes, a hemoglobin A1c value of 6.5% or greater indicates that they may have  diabetes and this should be confirmed with a follow-up  test. . For someone with known diabetes, a value <7% indicates  that their diabetes is well controlled and a value  greater than or equal to 7% indicates suboptimal  control. A1c targets should be individualized based on  duration of diabetes, age, comorbid conditions, and  other considerations. . Currently, no consensus exists regarding use of hemoglobin A1c for diagnosis of diabetes for children. .          Failed - Cr in normal range and within 360 days    Creat  Date Value Ref Range Status  12/30/2023 1.35 (H) 0.70 - 1.28 mg/dL Final   Creatinine, Urine  Date Value Ref Range Status  10/03/2023 34 20 - 320 mg/dL Final         Passed - Valid encounter within last 6 months    Recent Outpatient Visits           1 month ago Uncontrolled type 2 diabetes mellitus with hyperosmolarity without coma, without long-term current use of insulin  [facility])   [place] [facility] Medicine Austine Lefort, MD   4 months ago Uncontrolled type 2 diabetes mellitus with hyperosmolarity without coma, without long-term current use of insulin  [facility])   [place] [facility] Austine Lefort, MD   1 year ago Uncontrolled type 2 diabetes mellitus with hyperosmolarity without coma, without long-term  current use of insulin  [facility])   [place] [facility] Medicine Austine Lefort, MD   1 year ago Acute heart failure with preserved ejection fraction [facility])   [place] [facility] Medicine Austine Lefort, MD   1 year ago Cellulitis of right lower extremity   [place] [facility] Family Medicine Pickard, Cisco Crest, MD       Future Appointments             In 2 months Mallipeddi, Kennyth Pean, MD [facility] at [place], [place]   In 3 months  [facility] at [employer] [facility] [employer] [employer], [employer]

## 2024-02-23 ENCOUNTER — Other Ambulatory Visit: Payer: Self-pay | Admitting: Internal Medicine

## 2024-03-14 ENCOUNTER — Other Ambulatory Visit: Payer: Self-pay | Admitting: Family Medicine

## 2024-03-14 DIAGNOSIS — E1100 Type 2 diabetes mellitus with hyperosmolarity without nonketotic hyperglycemic-hyperosmolar coma (NKHHC): Secondary | ICD-10-CM

## 2024-03-17 ENCOUNTER — Ambulatory Visit (INDEPENDENT_AMBULATORY_CARE_PROVIDER_SITE_OTHER): Payer: Medicare Other

## 2024-03-17 DIAGNOSIS — I480 Paroxysmal atrial fibrillation: Secondary | ICD-10-CM | POA: Diagnosis not present

## 2024-03-18 LAB — CUP PACEART REMOTE DEVICE CHECK
Battery Remaining Longevity: 134 mo
Battery Voltage: 3.02 V
Brady Statistic AP VP Percent: 79.92 %
Brady Statistic AP VS Percent: 0 %
Brady Statistic AS VP Percent: 20.07 %
Brady Statistic AS VS Percent: 0.01 %
Brady Statistic RA Percent Paced: 79.7 %
Brady Statistic RV Percent Paced: 99.98 %
Date Time Interrogation Session: 20250623202119
Implantable Lead Connection Status: 753985
Implantable Lead Connection Status: 753985
Implantable Lead Implant Date: 20231222
Implantable Lead Implant Date: 20231222
Implantable Lead Location: 753859
Implantable Lead Location: 753860
Implantable Lead Model: 3830
Implantable Lead Model: 5076
Implantable Pulse Generator Implant Date: 20231222
Lead Channel Impedance Value: 361 Ohm
Lead Channel Impedance Value: 418 Ohm
Lead Channel Impedance Value: 437 Ohm
Lead Channel Impedance Value: 570 Ohm
Lead Channel Pacing Threshold Amplitude: 0.75 V
Lead Channel Pacing Threshold Amplitude: 0.875 V
Lead Channel Pacing Threshold Pulse Width: 0.4 ms
Lead Channel Pacing Threshold Pulse Width: 0.4 ms
Lead Channel Sensing Intrinsic Amplitude: 17.625 mV
Lead Channel Sensing Intrinsic Amplitude: 19.5 mV
Lead Channel Sensing Intrinsic Amplitude: 3.125 mV
Lead Channel Sensing Intrinsic Amplitude: 3.125 mV
Lead Channel Setting Pacing Amplitude: 1.5 V
Lead Channel Setting Pacing Amplitude: 2 V
Lead Channel Setting Pacing Pulse Width: 0.4 ms
Lead Channel Setting Sensing Sensitivity: 0.9 mV
Zone Setting Status: 755011

## 2024-03-19 ENCOUNTER — Ambulatory Visit (INDEPENDENT_AMBULATORY_CARE_PROVIDER_SITE_OTHER)

## 2024-03-19 DIAGNOSIS — E1100 Type 2 diabetes mellitus with hyperosmolarity without nonketotic hyperglycemic-hyperosmolar coma (NKHHC): Secondary | ICD-10-CM

## 2024-03-19 LAB — HM DIABETES EYE EXAM

## 2024-03-19 NOTE — Progress Notes (Signed)
 Steven Henry arrived 03/19/2024 and has given verbal consent to obtain images and complete their overdue diabetic retinal screening.  The images have been sent to an ophthalmologist or optometrist for review and interpretation.  Results will be sent back to Duanne Butler DASEN, MD for review.  Patient has been informed they will be contacted when we receive the results via telephone or MyChart

## 2024-03-20 NOTE — Progress Notes (Signed)
 Images were received from diabetic eye exam. The images were unreadable. It is recommended that pt get a referral to an ophthalmologist for further imaging. The patient will be contacted and referred if agreeable.

## 2024-03-25 ENCOUNTER — Other Ambulatory Visit: Payer: Self-pay

## 2024-03-25 DIAGNOSIS — E1100 Type 2 diabetes mellitus with hyperosmolarity without nonketotic hyperglycemic-hyperosmolar coma (NKHHC): Secondary | ICD-10-CM

## 2024-03-25 NOTE — Telephone Encounter (Signed)
 Prescription Request  03/25/2024  LOV: 12/30/23  What is the name of the medication or equipment? insulin  glargine (LANTUS ) 100 UNIT/ML injection [[id number]]   Have you contacted your pharmacy to request a refill? Yes   Which pharmacy would you like this sent to?  [facility] [place], [place] - [address] [place] [address] Phone: [phone] Fax: [phone]    Patient notified that their request is being sent to the clinical staff for review and that they should receive a response within 2 business days.   Please advise at [facility] [phone]

## 2024-03-26 MED ORDER — INSULIN GLARGINE 100 UNIT/ML ~~LOC~~ SOLN: 25.0000 [IU] | Freq: Every day | SUBCUTANEOUS | 0 refills | Status: DC

## 2024-03-26 NOTE — Telephone Encounter (Signed)
 Requested Prescriptions  Pending Prescriptions Disp Refills   insulin  glargine (LANTUS ) 100 UNIT/ML injection 30 mL 0    Sig: Inject 0.25 mLs (25 Units total) into the skin daily. PLEASE INCREASE INSULIN  BY 1 UNIT DAILY UNTIL FBG IS <130, THEN HOLD AT THAT DOSE. OFFICE VISIT NEEDED FOR ADDITIONAL REFILLS     Endocrinology:  Diabetes - Insulins Failed - 03/26/2024  8:48 PM      Failed - HBA1C is between 0 and 7.9 and within 180 days    Hgb A1c MFr Bld  Date Value Ref Range Status  12/30/2023 8.3 (H) <5.7 % of total Hgb Final    Comment:    For someone without known diabetes, a hemoglobin A1c value of 6.5% or greater indicates that they may have  diabetes and this should be confirmed with a follow-up  test. . For someone with known diabetes, a value <7% indicates  that their diabetes is well controlled and a value  greater than or equal to 7% indicates suboptimal  control. A1c targets should be individualized based on  duration of diabetes, age, comorbid conditions, and  other considerations. . Currently, no consensus exists regarding use of hemoglobin A1c for diagnosis of diabetes for children. SABRA Amy - Valid encounter within last 6 months    Recent Outpatient Visits           2 months ago Uncontrolled type 2 diabetes mellitus with hyperosmolarity without coma, without long-term current use of insulin  [facility])   [place] [facility] Medicine Duanne Butler DASEN, MD   5 months ago Uncontrolled type 2 diabetes mellitus with hyperosmolarity without coma, without long-term current use of insulin  [facility])   [place] [facility] Medicine Duanne Butler DASEN, MD   1 year ago Uncontrolled type 2 diabetes mellitus with hyperosmolarity without coma, without long-term current use of insulin  [facility])   [place] [facility] Family Medicine Duanne Butler DASEN, MD   1 year ago Acute heart failure with preserved ejection fraction [facility])   [place] [facility] Family  Medicine Duanne Butler DASEN, MD   1 year ago Cellulitis of right lower extremity   [place] [facility] Medicine Pickard, Butler DASEN, MD       Future Appointments             In 1 month Mallipeddi, Diannah SQUIBB, MD [place] [facility] at [place], [place]   In 2 months  [facility] at [employer] [facility] [employer]. [facility] [employer], [facility]

## 2024-04-02 ENCOUNTER — Other Ambulatory Visit: Payer: Self-pay | Admitting: Internal Medicine

## 2024-04-02 ENCOUNTER — Other Ambulatory Visit: Payer: Self-pay | Admitting: Family Medicine

## 2024-04-02 DIAGNOSIS — E1100 Type 2 diabetes mellitus with hyperosmolarity without nonketotic hyperglycemic-hyperosmolar coma (NKHHC): Secondary | ICD-10-CM

## 2024-04-02 DIAGNOSIS — N183 Chronic kidney disease, stage 3 unspecified: Secondary | ICD-10-CM

## 2024-04-21 ENCOUNTER — Other Ambulatory Visit (HOSPITAL_COMMUNITY)

## 2024-04-23 ENCOUNTER — Other Ambulatory Visit: Payer: Self-pay | Admitting: Physician Assistant

## 2024-04-23 ENCOUNTER — Other Ambulatory Visit: Payer: Self-pay | Admitting: Cardiovascular Disease

## 2024-04-27 ENCOUNTER — Ambulatory Visit: Admitting: Internal Medicine

## 2024-04-29 ENCOUNTER — Other Ambulatory Visit: Payer: Self-pay

## 2024-04-29 ENCOUNTER — Encounter: Payer: Self-pay | Admitting: Family Medicine

## 2024-04-29 DIAGNOSIS — E1100 Type 2 diabetes mellitus with hyperosmolarity without nonketotic hyperglycemic-hyperosmolar coma (NKHHC): Secondary | ICD-10-CM

## 2024-04-29 DIAGNOSIS — I10 Essential (primary) hypertension: Secondary | ICD-10-CM

## 2024-05-05 ENCOUNTER — Other Ambulatory Visit: Payer: Self-pay

## 2024-05-05 MED ORDER — FLECAINIDE ACETATE 100 MG PO TABS
100.0000 mg | ORAL_TABLET | Freq: Two times a day (BID) | ORAL | 0 refills | Status: DC
Start: 2024-05-05 — End: 2024-06-03

## 2024-05-20 ENCOUNTER — Other Ambulatory Visit: Payer: Self-pay | Admitting: Family Medicine

## 2024-05-20 DIAGNOSIS — I480 Paroxysmal atrial fibrillation: Secondary | ICD-10-CM

## 2024-05-20 NOTE — Telephone Encounter (Unsigned)
 Copied from CRM [id number]. Topic: Clinical - Medication Refill >> May 20, 2024  8:06 AM Avram MATSU wrote: Medication: ELIQUIS  5 MG TABS tablet [[id number]]  Has the patient contacted their pharmacy? Yes (Agent: If no, request that the patient contact the pharmacy for the refill. If patient does not wish to contact the pharmacy document the reason why and proceed with request.) (Agent: If yes, when and what did the pharmacy advise?)  This is the patient's preferred pharmacy:  [facility] - [place], [place] - [address] [place] [address] Phone: [phone] Fax: [phone]  Is this the correct pharmacy for this prescription? Yes If no, delete pharmacy and type the correct one.   Has the prescription been filled recently? No  Is the patient out of the medication? Yes 2 days left  Has the patient been seen for an appointment in the last year OR does the patient have an upcoming appointment? Yes  Can we respond through MyChart? Yes  Agent: Please be advised that Rx refills may take up to 3 business days. We ask that you follow-up with your pharmacy.

## 2024-05-21 MED ORDER — APIXABAN 5 MG PO TABS: 5.0000 mg | ORAL_TABLET | Freq: Two times a day (BID) | ORAL | 1 refills | Status: DC

## 2024-05-21 NOTE — Telephone Encounter (Signed)
 Requested Prescriptions  Pending Prescriptions Disp Refills   apixaban  (ELIQUIS ) 5 MG TABS tablet 180 tablet 1    Sig: Take 1 tablet (5 mg total) by mouth 2 (two) times daily.     Hematology:  Anticoagulants - apixaban  Failed - 05/21/2024  3:15 PM      Failed - Cr in normal range and within 360 days    Creat  Date Value Ref Range Status  12/30/2023 1.35 (H) 0.70 - 1.28 mg/dL Final   Creatinine, Urine  Date Value Ref Range Status  10/03/2023 34 20 - 320 mg/dL Final         Passed - PLT in normal range and within 360 days    Platelets  Date Value Ref Range Status  12/30/2023 193 140 - 400 Thousand/uL Final         Passed - HGB in normal range and within 360 days    Hemoglobin  Date Value Ref Range Status  12/30/2023 16.3 13.2 - 17.1 g/dL Final         Passed - HCT in normal range and within 360 days    HCT  Date Value Ref Range Status  12/30/2023 49.1 38.5 - 50.0 % Final         Passed - AST in normal range and within 360 days    AST  Date Value Ref Range Status  12/30/2023 16 10 - 35 U/L Final         Passed - ALT in normal range and within 360 days    ALT  Date Value Ref Range Status  12/30/2023 9 9 - 46 U/L Final         Passed - Valid encounter within last 12 months    Recent Outpatient Visits           4 months ago Uncontrolled type 2 diabetes mellitus with hyperosmolarity without coma, without long-term current use of insulin  (HCC)   [place] [facility] Duanne Butler DASEN, MD   7 months ago Uncontrolled type 2 diabetes mellitus with hyperosmolarity without coma, without long-term current use of insulin  [facility])   [place] [facility] Medicine Duanne Butler DASEN, MD   1 year ago Uncontrolled type 2 diabetes mellitus with hyperosmolarity without coma, without long-term current use of insulin  [facility])   [place] [facility] Medicine Duanne Butler DASEN, MD   1 year ago Acute heart failure with preserved ejection fraction [facility])    [place] [facility] Duanne Butler DASEN, MD   1 year ago Cellulitis of right lower extremity   [place] [facility] Family Medicine Pickard, Butler DASEN, MD       Future Appointments             In 3 weeks Mallipeddi, Diannah SQUIBB, MD [facility] at [place], [place]

## 2024-05-22 ENCOUNTER — Other Ambulatory Visit: Payer: Self-pay | Admitting: Family Medicine

## 2024-05-22 DIAGNOSIS — N183 Chronic kidney disease, stage 3 unspecified: Secondary | ICD-10-CM

## 2024-05-22 DIAGNOSIS — E1100 Type 2 diabetes mellitus with hyperosmolarity without nonketotic hyperglycemic-hyperosmolar coma (NKHHC): Secondary | ICD-10-CM

## 2024-05-25 ENCOUNTER — Other Ambulatory Visit: Payer: Self-pay | Admitting: Physician Assistant

## 2024-05-25 ENCOUNTER — Other Ambulatory Visit: Payer: Self-pay | Admitting: Family Medicine

## 2024-05-25 DIAGNOSIS — E7800 Pure hypercholesterolemia, unspecified: Secondary | ICD-10-CM

## 2024-05-26 ENCOUNTER — Other Ambulatory Visit (HOSPITAL_COMMUNITY)

## 2024-06-01 ENCOUNTER — Other Ambulatory Visit (HOSPITAL_COMMUNITY)

## 2024-06-02 ENCOUNTER — Other Ambulatory Visit: Payer: Self-pay | Admitting: Family Medicine

## 2024-06-02 ENCOUNTER — Other Ambulatory Visit: Payer: Self-pay | Admitting: Student

## 2024-06-02 DIAGNOSIS — E1100 Type 2 diabetes mellitus with hyperosmolarity without nonketotic hyperglycemic-hyperosmolar coma (NKHHC): Secondary | ICD-10-CM

## 2024-06-02 NOTE — Telephone Encounter (Unsigned)
 Copied from CRM [id number]. Topic: Clinical - Medication Refill >> Jun 02, 2024 11:20 AM Tiffini S wrote: Medication: flecainide  (TAMBOCOR ) 100 MG tablet  Has the patient contacted their pharmacy? Yes (Agent: If no, request that the patient contact the pharmacy for the refill. If patient does not wish to contact the pharmacy document the reason why and proceed with request.) (Agent: If yes, when and what did the pharmacy advise?)  This is the patient's preferred pharmacy:  [facility] - [place], [place] - [address] [place] [address] Phone: [phone] Fax: [phone]  Is this the correct pharmacy for this prescription? Yes If no, delete pharmacy and type the correct one.   Has the prescription been filled recently? Yes  Is the patient out of the medication? Yes  Has the patient been seen for an appointment in the last year OR does the patient have an upcoming appointment? Yes  Can we respond through MyChart? No, please call the patient at [phone]  Agent: Please be advised that Rx refills may take up to 3 business days. We ask that you follow-up with your pharmacy.

## 2024-06-03 ENCOUNTER — Ambulatory Visit: Payer: Medicare Other

## 2024-06-03 ENCOUNTER — Other Ambulatory Visit (HOSPITAL_COMMUNITY): Payer: Medicare Other

## 2024-06-03 MED ORDER — FLECAINIDE ACETATE 100 MG PO TABS: 100.0000 mg | ORAL_TABLET | Freq: Two times a day (BID) | ORAL | 0 refills | Status: DC

## 2024-06-03 NOTE — Telephone Encounter (Signed)
 Requested medication (s) are due for refill today:   Provider to review  Requested medication (s) are on the active medication list:   Yes  Future visit scheduled:   No.    LOV 12/30/2023   Last ordered: 06/03/2024 #180, 0 refills  No protocol assigned  Looks like cardiology prescribes this.     Requested Prescriptions  Pending Prescriptions Disp Refills   flecainide  (TAMBOCOR ) 100 MG tablet 30 tablet 0    Sig: Take 1 tablet (100 mg total) by mouth 2 (two) times daily.     Off-Protocol Failed - 06/03/2024  2:14 PM      Failed - Medication not assigned to a protocol, review manually.      Passed - Valid encounter within last 12 months    Recent Outpatient Visits           5 months ago Uncontrolled type 2 diabetes mellitus with hyperosmolarity without coma, without long-term current use of insulin  [facility])   [place] [facility] Medicine Duanne Butler DASEN, MD   8 months ago Uncontrolled type 2 diabetes mellitus with hyperosmolarity without coma, without long-term current use of insulin  [facility])   [place] [facility] Medicine Duanne Butler DASEN, MD   1 year ago Uncontrolled type 2 diabetes mellitus with hyperosmolarity without coma, without long-term current use of insulin  [facility])   [place] Medicine Duanne Butler DASEN, MD   1 year ago Acute heart failure with preserved ejection fraction [facility])   [place] [facility] Family Medicine Duanne Butler DASEN, MD   1 year ago Cellulitis of right lower extremity    [facility] Pickard, Butler DASEN, MD       Future Appointments             In 1 week Mallipeddi, Diannah SQUIBB, MD [facility] at [place], [place]

## 2024-06-03 NOTE — Addendum Note (Signed)
 Addended by: DARIO EARING L on: 06/03/2024 11:03 AM   Modules accepted: Orders

## 2024-06-08 ENCOUNTER — Ambulatory Visit (HOSPITAL_COMMUNITY)
Admission: RE | Admit: 2024-06-08 | Discharge: 2024-06-08 | Disposition: A | Discharge status: Home / Self Care | Source: Ambulatory Visit | Attending: Physician Assistant | Admitting: Physician Assistant

## 2024-06-08 DIAGNOSIS — Z952 Presence of prosthetic heart valve: Secondary | ICD-10-CM | POA: Insufficient documentation

## 2024-06-08 LAB — ECHOCARDIOGRAM COMPLETE
AV Mean grad: 9.3 mmHg
AV Peak grad: 19.7 mmHg
Ao pk vel: 2.22 m/s
Area-P 1/2: 2.87 cm2
S' Lateral: 3.4 cm

## 2024-06-08 NOTE — Progress Notes (Signed)
*  PRELIMINARY RESULTS* Echocardiogram 2D Echocardiogram has been performed.  Steven Henry 06/08/2024, 9:28 AM

## 2024-06-09 ENCOUNTER — Ambulatory Visit: Payer: Self-pay | Admitting: Physician Assistant

## 2024-06-15 ENCOUNTER — Encounter: Payer: Self-pay | Admitting: Internal Medicine

## 2024-06-15 ENCOUNTER — Ambulatory Visit: Attending: Internal Medicine | Admitting: Internal Medicine

## 2024-06-15 VITALS — BP 115/68 | HR 68 | Ht 73.0 in | Wt 265.2 lb

## 2024-06-15 DIAGNOSIS — I480 Paroxysmal atrial fibrillation: Secondary | ICD-10-CM | POA: Diagnosis not present

## 2024-06-15 DIAGNOSIS — Z952 Presence of prosthetic heart valve: Secondary | ICD-10-CM | POA: Diagnosis not present

## 2024-06-15 NOTE — Patient Instructions (Addendum)
 Medication Instructions:  Your physician recommends that you continue on your current medications as directed. Please refer to the Current Medication list given to you today.   Labwork: None  Testing/Procedures: Your physician has requested that you have an echocardiogram in one year. Echocardiography is a painless test that uses sound waves to create images of your heart. It provides your doctor with information about the size and shape of your heart and how well your heart's chambers and valves are working. This procedure takes approximately one hour. There are no restrictions for this procedure. Please do NOT wear cologne, perfume, aftershave, or lotions (deodorant is allowed). Please arrive 15 minutes prior to your appointment time.  Please note: We ask at that you not bring children with you during ultrasound (echo/ vascular) testing. Due to room size and safety concerns, children are not allowed in the ultrasound rooms during exams. Our front office staff cannot provide observation of children in our lobby area while testing is being conducted. An adult accompanying a patient to their appointment will only be allowed in the ultrasound room at the discretion of the ultrasound technician under special circumstances. We apologize for any inconvenience.   Follow-Up: Your physician recommends that you schedule a follow-up appointment in: 1 year. You will receive a reminder call in about 8 months reminding you to schedule your appointment. If you don't receive this call, please contact our office.   Any Other Special Instructions Will Be Listed Below (If Applicable). Thank you for choosing [facility]!     If you need a refill on your cardiac medications before your next appointment, please call your pharmacy.

## 2024-06-15 NOTE — Progress Notes (Addendum)
 Cardiology Office Note  Date: 06/15/2024   ID: DIYAN Steven Henry, DOB [birth year], MRN [id number]  PCP:  Duanne Butler DASEN, MD  Cardiologist:  Diannah SHAUNNA Maywood, MD Electrophysiologist:  None   History of Present Illness: Steven Henry is a 73 y.o. 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   Overall doing great, no symptoms.  No angina or DOE.  Prior to the valve procedure, he was short of breath with minimal distances.  But not anymore.  No dizziness, syncope, palpitations or leg swelling.  He does not have any teeth, had to be removed prior to the TAVR procedure.  Compliant with medications and has no side effects.  Currently on flecainide  for A-fib.  Managed by EP.  EKG today showed AV dual paced rhythm.  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 Wonda and Dr. Lucas   Severe aortic stenosis    Sleep apnea    not on CPAP     Past Surgical History:  Procedure Laterality Date   CARDIOVERSION N/A 11/05/2022   Procedure: CARDIOVERSION;  Surgeon: Alvan Ronal FORBES, MD;  Location: [facility] ENDOSCOPY;  Service: Cardiovascular;  Laterality: N/A;   CARDIOVERSION N/A 01/09/2023   Procedure: CARDIOVERSION;  Surgeon: Jeffrie Oneil BROCKS, MD;  Location: [facility] INVASIVE CV LAB;  Service: Cardiovascular;  Laterality: N/A;   COLONOSCOPY N/A 09/16/2015   Procedure: COLONOSCOPY;  Surgeon: Lamar CHRISTELLA Hollingshead, MD;  Location: [facility] ENDO  SUITE;  Service: Endoscopy;  Laterality: N/A;  8:15 AM - pt has c pap   ENDOVENOUS ABLATION SAPHENOUS VEIN W/ LASER Right 08/26/2017   endovenous laser ablation right greater saphenous vein by Lynwood Collum MD    INTRAOPERATIVE TRANSTHORACIC ECHOCARDIOGRAM N/A 06/25/2023   Procedure: INTRAOPERATIVE TRANSTHORACIC ECHOCARDIOGRAM;  Surgeon: Wonda Sharper, MD;  Location: [facility] INVASIVE CV LAB;  Service: Open Heart Surgery;  Laterality: N/A;   PACEMAKER IMPLANT N/A 09/14/2022   Procedure: PACEMAKER IMPLANT;  Surgeon: Fernande Elspeth BROCKS, MD;  Location: [facility] INVASIVE CV LAB;  Service: Cardiovascular;  Laterality: N/A;   RIGHT/LEFT HEART CATH AND CORONARY ANGIOGRAPHY N/A 02/11/2023   Procedure: RIGHT/LEFT HEART CATH AND CORONARY ANGIOGRAPHY;  Surgeon: Wonda Sharper, MD;  Location: [facility] INVASIVE CV LAB;  Service: Cardiovascular;  Laterality: N/A;   stab phlebectomy  Right 01/07/2018   stab phlebectomy 10-20 incisions right leg by Lynwood Collum MD    TRANSCATHETER AORTIC VALVE REPLACEMENT, TRANSFEMORAL Right 06/25/2023   Procedure: Transcatheter Aortic Valve Replacement, Transfemoral;  Surgeon: Wonda Sharper, MD;  Location: [facility] INVASIVE CV LAB;  Service: Open Heart Surgery;  Laterality: Right;    Current Outpatient Medications  Medication Sig Dispense Refill   Accu-Chek FastClix Lancets MISC Check BS QAM - DX: E11.9 100 each 3   albuterol  (VENTOLIN  HFA) 108 (90 Base) MCG/ACT inhaler Inhale 2 puffs into the lungs every 6 (six)  hours as needed for wheezing or shortness of breath. 8 g 0   apixaban  (ELIQUIS ) 5 MG TABS tablet Take 1 tablet (5 mg total) by mouth 2 (two) times daily. 180 tablet 1   atorvastatin  (LIPITOR) 40 MG tablet TAKE 1 TABLET BY MOUTH DAILY 90 tablet 1   bisoprolol  (ZEBETA ) 5 MG tablet TAKE 1 TABLET BY MOUTH DAILY 90 tablet 1   Blood Glucose Monitoring Suppl (ONE TOUCH ULTRA 2) w/Device KIT USETO CHECK BLOOD SUGAR EVERY MORNING 1 kit 0   budesonide -formoterol  (SYMBICORT ) 160-4.5 MCG/ACT inhaler  Inhale 2 puffs into the lungs in the morning and at bedtime. (Patient taking differently: Inhale 2 puffs into the lungs 2 (two) times daily as needed (shortness of breath).) 1 each 12   Cholecalciferol (VITAMIN D-3) 125 MCG (5000 UT) TABS Take 5,000 Units by mouth 2 (two) times daily.     Continuous Blood Gluc Receiver (FREESTYLE LIBRE 2 READER) DEVI For continuous blood glucose monitoring 1 each 1   Continuous Glucose Sensor (FREESTYLE LIBRE 2 SENSOR) MISC APPLY 1 SENSOR ON THE SKIN EVERY 14 DAYS, USE AS DIRECTED TO CHECK BLOOD SUGAR 2 each 3   flecainide  (TAMBOCOR ) 100 MG tablet Take 1 tablet (100 mg total) by mouth 2 (two) times daily. 180 tablet 0   furosemide  (LASIX ) 40 MG tablet TAKE 2 TABLETS BY MOUTH TWICE DAILY 360 tablet 1   insulin  glargine (LANTUS ) 100 UNIT/ML injection Inject 0.25 mLs (25 Units total) into the skin daily. PLEASE INCREASE INSULIN  BY 1 UNIT DAILY UNTIL FBG IS <130, THEN HOLD AT THAT DOSE. OFFICE VISIT NEEDED FOR ADDITIONAL REFILLS 30 mL 0   ipratropium-albuterol  (DUONEB) 0.5-2.5 (3) MG/3ML SOLN Take 3 mLs by nebulization 3 (three) times daily. (Patient taking differently: Take 3 mLs by nebulization every 4 (four) hours as needed (shortness of breath).) 360 mL 1   JARDIANCE  25 MG TABS tablet TAKE 1 TABLET BY MOUTH DAILY BEFORE BREAKFAST 90 tablet 0   Lancets Misc. (ACCU-CHEK FASTCLIX LANCET) KIT Check BS QAM - DX: E11.9 1 kit 0   metFORMIN  (GLUCOPHAGE ) 1000 MG tablet TAKE 1 TABLET BY MOUTH TWICE DAILY WITH MEALS 180 tablet 0   Multiple Vitamin (MULTIVITAMIN) capsule Take 1 capsule by mouth in the morning. Mens one a day     ONETOUCH ULTRA test strip USE TO CHECK BLOOD SUGAR EVERY MORNING 100 strip 12   potassium chloride  SA (KLOR-CON  M) 20 MEQ tablet TAKE 1 TABLET BY MOUTH TWICE DAILY 180 tablet 0   Semaglutide , 2 MG/DOSE, (OZEMPIC , 2 MG/DOSE,) 8 MG/3ML SOPN INJECT 2 MG into THE SKIN ONCE WEEKLY 9 mL 1   spironolactone  (ALDACTONE ) 25 MG tablet TAKE 1 TABLET BY MOUTH DAILY 90  tablet 1   Zinc 30 MG CAPS Take 30 mg by mouth daily.     No current facility-administered medications for this visit.   Allergies:  Patient has no known allergies.   Social History: The patient  reports that he has been smoking cigarettes. He started smoking about 66 years ago. He has a 99.3 pack-year smoking history. He has never used smokeless tobacco. He reports current alcohol use of about 1.0 standard drink of alcohol per week. He reports that he does not use drugs.   Family History: The patient's family history includes Colon cancer in his mother; Diabetes in his mother, sister, sister, and sister; Heart disease in his mother.   ROS:  Please see the history of present illness. Otherwise, complete review of systems is positive for  none  All other systems are reviewed and negative.   Physical Exam: VS:  BP 115/68   Pulse 68   Ht 6' 1 (1.854 m)   Wt 265 lb 3.2 oz (120.3 kg)   SpO2 96%   BMI 34.99 kg/m , BMI Body mass index is 34.99 kg/m.  Wt Readings from Last 3 Encounters:  06/15/24 265 lb 3.2 oz (120.3 kg)  12/30/23 252 lb 12.8 oz (114.7 kg)  10/08/23 256 lb 3.2 oz (116.2 kg)    General: Patient appears comfortable at rest. HEENT: Conjunctiva and lids normal, oropharynx clear with moist mucosa. Neck: Supple, no elevated JVP or carotid bruits, no thyromegaly. Lungs: Clear to auscultation, nonlabored breathing at rest. Cardiac: Regular rate and rhythm, no S3 or significant systolic murmur, no pericardial rub. Abdomen: Soft, nontender, no hepatomegaly, bowel sounds present, no guarding or rebound. Extremities: No pitting edema, distal pulses 2+. Skin: Warm and dry. Musculoskeletal: No kyphosis. Neuropsychiatric: Alert and oriented x3, affect grossly appropriate.  Recent Labwork: 06/26/2023: Magnesium  2.0 12/30/2023: ALT 9; AST 16; BUN 22; Creat 1.35; Hemoglobin 16.3; Platelets 193; Potassium 4.4; Sodium 137     Component Value Date/Time   CHOL 147 12/30/2023 1125   TRIG  145 12/30/2023 1125   HDL 48 12/30/2023 1125   CHOLHDL 3.1 12/30/2023 1125   VLDL 8 02/27/2022 0424   LDLCALC 76 12/30/2023 1125     Assessment and Plan:  Severe aortic valve stenosis s/p TAVR in 06/2023: Echo from 2024 and 2025 showed normal LVEF, normal functioning of the aortic prosthesis.  Mean AV PG 9.3 mmHg.  Does not have teeth, has dentures probably.  He is aware that he has to take an antibiotic prior to dental procedures.  Repeat echocardiogram in 1 year. NYHA class I symptoms.   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  He has an upcoming appointment day after tomorrow in [place] with Dr. Nancey.  A-fib s/p DCCV in 2/24 and 4/24: EKG today showed AV dual paced rhythm and prolonged AV conduction, PR more than 200 ms.  On flecainide , managed by electrophysiology.  Continue flecainide  100 mg twice daily, bisoprolol  5 mg once daily and Eliquis  5 mg twice daily.  Chronic diastolic heart failure: Compensated.  Continue p.o. Lasix  80 mg twice daily.  Continue Jardiance  milligram once daily (increased dose mainly for diabetes).  OSA on CPAP: Continue CPAP.  Insulin -dependent diabetes mellitus: Continue insulin , Jardiance  and Ozempic .  No blurry vision.  Follow-up with PCP.   30 minutes spent in review the prior records, imaging, test/reports, discussion of the above problems with the patient and documentation.  Medication Adjustments/Labs and Tests Ordered: Current medicines are reviewed at length with the patient today.  Concerns regarding medicines are outlined above.    Disposition:  Follow up 1 year  Signed Geet Hosking Arleta Maywood, MD, 06/15/2024 11:05 AM    [facility] [address] [place], [place], [place] [address]

## 2024-06-16 ENCOUNTER — Ambulatory Visit (INDEPENDENT_AMBULATORY_CARE_PROVIDER_SITE_OTHER): Payer: Medicare Other

## 2024-06-16 DIAGNOSIS — I480 Paroxysmal atrial fibrillation: Secondary | ICD-10-CM | POA: Diagnosis not present

## 2024-06-17 ENCOUNTER — Encounter: Payer: Self-pay | Admitting: Cardiovascular Disease

## 2024-06-17 ENCOUNTER — Ambulatory Visit: Attending: Internal Medicine | Admitting: Cardiovascular Disease

## 2024-06-17 VITALS — BP 114/64 | HR 79 | Ht 73.0 in | Wt 268.6 lb

## 2024-06-17 DIAGNOSIS — I4819 Other persistent atrial fibrillation: Secondary | ICD-10-CM

## 2024-06-17 DIAGNOSIS — I4891 Unspecified atrial fibrillation: Secondary | ICD-10-CM

## 2024-06-17 DIAGNOSIS — I350 Nonrheumatic aortic (valve) stenosis: Secondary | ICD-10-CM | POA: Diagnosis not present

## 2024-06-17 DIAGNOSIS — D6869 Other thrombophilia: Secondary | ICD-10-CM | POA: Diagnosis not present

## 2024-06-17 DIAGNOSIS — I5032 Chronic diastolic (congestive) heart failure: Secondary | ICD-10-CM | POA: Diagnosis not present

## 2024-06-17 DIAGNOSIS — I10 Essential (primary) hypertension: Secondary | ICD-10-CM

## 2024-06-17 LAB — CUP PACEART REMOTE DEVICE CHECK
Battery Remaining Longevity: 131 mo
Battery Voltage: 3.02 V
Brady Statistic AP VP Percent: 77.28 %
Brady Statistic AP VS Percent: 0 %
Brady Statistic AS VP Percent: 22.69 %
Brady Statistic AS VS Percent: 0.03 %
Brady Statistic RA Percent Paced: 77.05 %
Brady Statistic RV Percent Paced: 99.97 %
Date Time Interrogation Session: 20250922202412
Implantable Lead Connection Status: 753985
Implantable Lead Connection Status: 753985
Implantable Lead Implant Date: 20231222
Implantable Lead Implant Date: 20231222
Implantable Lead Location: 753859
Implantable Lead Location: 753860
Implantable Lead Model: 3830
Implantable Lead Model: 5076
Implantable Pulse Generator Implant Date: 20231222
Lead Channel Impedance Value: 380 Ohm
Lead Channel Impedance Value: 437 Ohm
Lead Channel Impedance Value: 437 Ohm
Lead Channel Impedance Value: 608 Ohm
Lead Channel Pacing Threshold Amplitude: 0.625 V
Lead Channel Pacing Threshold Amplitude: 0.875 V
Lead Channel Pacing Threshold Pulse Width: 0.4 ms
Lead Channel Pacing Threshold Pulse Width: 0.4 ms
Lead Channel Sensing Intrinsic Amplitude: 17.625 mV
Lead Channel Sensing Intrinsic Amplitude: 19.5 mV
Lead Channel Sensing Intrinsic Amplitude: 2.375 mV
Lead Channel Sensing Intrinsic Amplitude: 2.375 mV
Lead Channel Setting Pacing Amplitude: 1.5 V
Lead Channel Setting Pacing Amplitude: 2 V
Lead Channel Setting Pacing Pulse Width: 0.4 ms
Lead Channel Setting Sensing Sensitivity: 0.9 mV
Zone Setting Status: 755011

## 2024-06-17 LAB — CUP PACEART INCLINIC DEVICE CHECK
Date Time Interrogation Session: 20250924170856
Implantable Lead Connection Status: 753985
Implantable Lead Connection Status: 753985
Implantable Lead Implant Date: 20231222
Implantable Lead Implant Date: 20231222
Implantable Lead Location: 753859
Implantable Lead Location: 753860
Implantable Lead Model: 3830
Implantable Lead Model: 5076
Implantable Pulse Generator Implant Date: 20231222

## 2024-06-17 NOTE — Progress Notes (Signed)
 Remote PPM Transmission

## 2024-06-17 NOTE — Progress Notes (Signed)
 Electrophysiology Office Note:    Date:  06/17/2024   ID:  MITHRAN STRIKE, DOB 07-Jun-1951, MRN [id number]  PCP:  Duanne Butler DASEN, MD   [facility] Providers Cardiologist:  Diannah SHAUNNA Maywood, MD     Referring MD: Duanne Butler DASEN, MD   History of Present Illness:    Steven Henry is a 73 y.o. male with a medical history significant for persistent atrial fibrillation, high-grade AV block, Medtronic pacemaker placed in December 2023, severe aortic valve stenosis status post TAVR October 2024, obstructive sleep apnea not on CPAP, hypertension, diabetes referred for device and rhythm management.       History of Present Illness  [place] with heart failure symptoms and found to be in atrial fibrillation.  He was again admitted to [facility] with similar symptoms in June 2023.  A ZIO monitor was placed that showed several pauses resulting in insertion of a dual-chamber pacemaker by Dr. Fernande in December 2023.  He had recurrence of persistent atrial fibrillation and was started on flecainide  by Dr. Fernande in April 2024 and underwent DC cardioversion January 09, 2023         Today, he reports that he feels well and has no complaints.  No syncope, presyncope.  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  EKGs/Labs/Other Studies Reviewed Today:     Echocardiogram:  TTE September 2025 LVEF 55 to 60%.  Moderate biatrial enlargement.  Moderate left ventricular hypertrophy.  Edwards SAPIEN 3 aortic valve.     EKG:   EKG Interpretation Date/Time:  Wednesday June 17 2024 16:10:33 EDT Ventricular Rate:  79 PR Interval:  208 QRS Duration:  134 QT Interval:  356 QTC Calculation: 408 R Axis:   58  Text Interpretation: AV dual-paced rhythm When compared with ECG of 15-Jun-2024 10:40, Vent. rate has increased BY  14 BPM Confirmed by Nancey Scotts [phone]) on 06/17/2024 4:36:12 PM      Physical Exam:    VS:  BP 114/64   Pulse 79   Ht 6' 1 (1.854 m)   Wt 268 lb 9.6 oz (121.8 kg)   SpO2 92%   BMI 35.44 kg/m     Wt Readings from Last 3 Encounters:  06/17/24 268 lb 9.6 oz (121.8 kg)  06/15/24 265 lb 3.2 oz (120.3 kg)  12/30/23 252 lb 12.8 oz (114.7 kg)     GEN: Well nourished, well developed in no acute distress CARDIAC: RRR, no murmurs, rubs, gallops The device site is normal -- no tenderness, edema, drainage, redness, threatened erosion.  RESPIRATORY:  Normal work of breathing MUSCULOSKELETAL: no edema    ASSESSMENT & PLAN:     History of high-grade AV block Status post Medtronic dual-chamber pacemaker I reviewed today's pacemaker interrogation in detail.  See Paceart for report He appears to have intact sinus function.  We disable rate response.  I asked him to let us  know if he has decreased exertional capacity. MVP turned off --he has complete heart block today  Atrial fibrillation He has been maintained on flecainide  Discontinue flecainide  due to the history of aortic valve replacement, LVH and elevated coronary calcium  score  Monitor for recurrence of A-fib In the event of recurrence, we will plan to discuss ablation versus try Tikosyn.  Recurrent hospitalization for CHFpEF Oftentimes with exacerbation by atrial fibrillation Continue Jardiance  25, Aldactone  25 mg, Lasix  40 mg   Signed, Scotts FORBES Nancey, MD  06/17/2024 4:36 PM    [facility]

## 2024-06-17 NOTE — Patient Instructions (Signed)
 Medication Instructions:  Your physician has recommended you make the following change in your medication:   ** Stop Flecainide   *If you need a refill on your cardiac medications before your next appointment, please call your pharmacy*  Lab Work: None ordered.   If you have labs (blood work) drawn today and your tests are completely normal, you will receive your results only by: MyChart Message (if you have MyChart) OR A paper copy in the mail If you have any lab test that is abnormal or we need to change your treatment, we will call you to review the results.  Testing/Procedures: None ordered.   Follow-Up: At [facility], you and your health needs are our priority.  As part of our continuing mission to provide you with exceptional heart care, our providers are all part of one team.  This team includes your primary Cardiologist (physician) and Advanced Practice Providers or APPs (Physician Assistants and Nurse Practitioners) who all work together to provide you with the care you need, when you need it.  Your next appointment:   3 months with Dr Mealor

## 2024-06-24 DIAGNOSIS — D23111 Other benign neoplasm of skin of right upper eyelid, including canthus: Secondary | ICD-10-CM | POA: Diagnosis not present

## 2024-06-24 DIAGNOSIS — E119 Type 2 diabetes mellitus without complications: Secondary | ICD-10-CM | POA: Diagnosis not present

## 2024-06-24 DIAGNOSIS — H524 Presbyopia: Secondary | ICD-10-CM | POA: Diagnosis not present

## 2024-06-24 DIAGNOSIS — H25813 Combined forms of age-related cataract, bilateral: Secondary | ICD-10-CM | POA: Diagnosis not present

## 2024-06-24 DIAGNOSIS — Q142 Congenital malformation of optic disc: Secondary | ICD-10-CM | POA: Diagnosis not present

## 2024-06-30 ENCOUNTER — Other Ambulatory Visit: Payer: Self-pay | Admitting: Family Medicine

## 2024-06-30 DIAGNOSIS — N183 Chronic kidney disease, stage 3 unspecified: Secondary | ICD-10-CM

## 2024-06-30 DIAGNOSIS — E1100 Type 2 diabetes mellitus with hyperosmolarity without nonketotic hyperglycemic-hyperosmolar coma (NKHHC): Secondary | ICD-10-CM

## 2024-07-20 ENCOUNTER — Other Ambulatory Visit: Payer: Self-pay | Admitting: Family Medicine

## 2024-07-20 ENCOUNTER — Ambulatory Visit: Payer: Self-pay | Admitting: Cardiovascular Disease

## 2024-07-20 DIAGNOSIS — I5032 Chronic diastolic (congestive) heart failure: Secondary | ICD-10-CM

## 2024-07-20 DIAGNOSIS — E1100 Type 2 diabetes mellitus with hyperosmolarity without nonketotic hyperglycemic-hyperosmolar coma (NKHHC): Secondary | ICD-10-CM

## 2024-07-29 ENCOUNTER — Telehealth: Payer: Self-pay | Admitting: Family Medicine

## 2024-07-29 NOTE — Telephone Encounter (Signed)
 Patient came to the office to drop off a from from [facility] for provider to complete and sign (one page).  No fax number listed on form (patient aware). Advised patient we will call him when form completed and ready for pickup.  Form placed on desk of provider's nurse.   Please advise patient at [phone].

## 2024-07-30 ENCOUNTER — Ambulatory Visit (INDEPENDENT_AMBULATORY_CARE_PROVIDER_SITE_OTHER): Admitting: *Deleted

## 2024-07-30 VITALS — Ht 73.0 in | Wt 268.0 lb

## 2024-07-30 DIAGNOSIS — Z0000 Encounter for general adult medical examination without abnormal findings: Secondary | ICD-10-CM

## 2024-07-30 NOTE — Patient Instructions (Signed)
 [name] , Thank you for taking time to come for your Medicare Wellness Visit. I appreciate your ongoing commitment to your health goals. Please review the following plan we discussed and let me know if I can assist you in the future.   Screening recommendations/referrals: Colonoscopy: Education provided Recommended yearly ophthalmology/optometry visit for glaucoma screening and checkup Recommended yearly dental visit for hygiene and checkup  Vaccinations: Influenza vaccine:  Pneumococcal vaccine:  Tdap vaccine:  Shingles vaccine:        Preventive Care 65 Years and Older, Male Preventive care refers to lifestyle choices and visits with your health care provider that can promote health and wellness. What does preventive care include? A yearly physical exam. This is also called an annual well check. Dental exams once or twice a year. Routine eye exams. Ask your health care provider how often you should have your eyes checked. Personal lifestyle choices, including: Daily care of your teeth and gums. Regular physical activity. Eating a healthy diet. Avoiding tobacco and drug use. Limiting alcohol use. Practicing safe sex. Taking low doses of aspirin  every day. Taking vitamin and mineral supplements as recommended by your health care provider. What happens during an annual well check? The services and screenings done by your health care provider during your annual well check will depend on your age, overall health, lifestyle risk factors, and family history of disease. Counseling  Your health care provider may ask you questions about your: Alcohol use. Tobacco use. Drug use. Emotional well-being. Home and relationship well-being. Sexual activity. Eating habits. History of falls. Memory and ability to understand (cognition). Work and work astronomer. Screening  You may have the following tests or measurements: Height, weight, and BMI. Blood pressure. Lipid and  cholesterol levels. These may be checked every 5 years, or more frequently if you are over 79 years old. Skin check. Lung cancer screening. You may have this screening every year starting at age 18 if you have a 30-pack-year history of smoking and currently smoke or have quit within the past 15 years. Fecal occult blood test (FOBT) of the stool. You may have this test every year starting at age 63. Flexible sigmoidoscopy or colonoscopy. You may have a sigmoidoscopy every 5 years or a colonoscopy every 10 years starting at age 67. Prostate cancer screening. Recommendations will vary depending on your family history and other risks. Hepatitis C blood test. Hepatitis B blood test. Sexually transmitted disease (STD) testing. Diabetes screening. This is done by checking your blood sugar (glucose) after you have not eaten for a while (fasting). You may have this done every 1-3 years. Abdominal aortic aneurysm (AAA) screening. You may need this if you are a current or former smoker. Osteoporosis. You may be screened starting at age 75 if you are at high risk. Talk with your health care provider about your test results, treatment options, and if necessary, the need for more tests. Vaccines  Your health care provider may recommend certain vaccines, such as: Influenza vaccine. This is recommended every year. Tetanus, diphtheria, and acellular pertussis (Tdap, Td) vaccine. You may need a Td booster every 10 years. Zoster vaccine. You may need this after age 41. Pneumococcal 13-valent conjugate (PCV13) vaccine. One dose is recommended after age 59. Pneumococcal polysaccharide (PPSV23) vaccine. One dose is recommended after age 15. Talk to your health care provider about which screenings and vaccines you need and how often you need them. This information is not intended to replace advice given to you by your  health care provider. Make sure you discuss any questions you have with your health care  provider. Document Released: 10/07/2015 Document Revised: 05/30/2016 Document Reviewed: 07/12/2015 Elsevier Interactive Patient Education  2017 [employer].  Fall Prevention in the Home Falls can cause injuries. They can happen to people of all ages. There are many things you can do to make your home safe and to help prevent falls. What can I do on the outside of my home? Regularly fix the edges of walkways and driveways and fix any cracks. Remove anything that might make you trip as you walk through a door, such as a raised step or threshold. Trim any bushes or trees on the path to your home. Use bright outdoor lighting. Clear any walking paths of anything that might make someone trip, such as rocks or tools. Regularly check to see if handrails are loose or broken. Make sure that both sides of any steps have handrails. Any raised decks and porches should have guardrails on the edges. Have any leaves, snow, or ice cleared regularly. Use sand or salt on walking paths during winter. Clean up any spills in your garage right away. This includes oil or grease spills. What can I do in the bathroom? Use night lights. Install grab bars by the toilet and in the tub and shower. Do not use towel bars as grab bars. Use non-skid mats or decals in the tub or shower. If you need to sit down in the shower, use a plastic, non-slip stool. Keep the floor dry. Clean up any water  that spills on the floor as soon as it happens. Remove soap buildup in the tub or shower regularly. Attach bath mats securely with double-sided non-slip rug tape. Do not have throw rugs and other things on the floor that can make you trip. What can I do in the bedroom? Use night lights. Make sure that you have a light by your bed that is easy to reach. Do not use any sheets or blankets that are too big for your bed. They should not hang down onto the floor. Have a firm chair that has side arms. You can use this for support while  you get dressed. Do not have throw rugs and other things on the floor that can make you trip. What can I do in the kitchen? Clean up any spills right away. Avoid walking on wet floors. Keep items that you use a lot in easy-to-reach places. If you need to reach something above you, use a strong step stool that has a grab bar. Keep electrical cords out of the way. Do not use floor polish or wax that makes floors slippery. If you must use wax, use non-skid floor wax. Do not have throw rugs and other things on the floor that can make you trip. What can I do with my stairs? Do not leave any items on the stairs. Make sure that there are handrails on both sides of the stairs and use them. Fix handrails that are broken or loose. Make sure that handrails are as long as the stairways. Check any carpeting to make sure that it is firmly attached to the stairs. Fix any carpet that is loose or worn. Avoid having throw rugs at the top or bottom of the stairs. If you do have throw rugs, attach them to the floor with carpet tape. Make sure that you have a light switch at the top of the stairs and the bottom of the stairs. If you do  not have them, ask someone to add them for you. What else can I do to help prevent falls? Wear shoes that: Do not have high heels. Have rubber bottoms. Are comfortable and fit you well. Are closed at the toe. Do not wear sandals. If you use a stepladder: Make sure that it is fully opened. Do not climb a closed stepladder. Make sure that both sides of the stepladder are locked into place. Ask someone to hold it for you, if possible. Clearly mark and make sure that you can see: Any grab bars or handrails. First and last steps. Where the edge of each step is. Use tools that help you move around (mobility aids) if they are needed. These include: Canes. Walkers. Scooters. Crutches. Turn on the lights when you go into a dark area. Replace any light bulbs as soon as they burn  out. Set up your furniture so you have a clear path. Avoid moving your furniture around. If any of your floors are uneven, fix them. If there are any pets around you, be aware of where they are. Review your medicines with your doctor. Some medicines can make you feel dizzy. This can increase your chance of falling. Ask your doctor what other things that you can do to help prevent falls. This information is not intended to replace advice given to you by your health care provider. Make sure you discuss any questions you have with your health care provider. Document Released: 07/07/2009 Document Revised: 02/16/2016 Document Reviewed: 10/15/2014 Elsevier Interactive Patient Education  2017 [employer].

## 2024-07-30 NOTE — Progress Notes (Signed)
 Subjective:   Steven Henry is a 73 y.o. male who presents for a Medicare Annual Wellness Visit.  Allergies (verified) Patient has no known allergies.   I connected with  Steven Henry on 07/30/24 by a audio enabled telemedicine application and verified that I am speaking with the correct person using two identifiers.  Patient Location: Home  Provider Location: Home Office  I discussed the limitations of evaluation and management by telemedicine. The patient expressed understanding and agreed to proceed.   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 Wonda and Dr. Lucas   Severe aortic stenosis    Sleep apnea    not on CPAP    Past Surgical History:  Procedure Laterality Date   CARDIOVERSION N/A 11/05/2022   Procedure: CARDIOVERSION;  Surgeon: Alvan Ronal FORBES, MD;  Location: [facility] ENDOSCOPY;  Service: Cardiovascular;  Laterality: N/A;   CARDIOVERSION N/A 01/09/2023   Procedure: CARDIOVERSION;  Surgeon: Jeffrie Oneil BROCKS, MD;  Location: [facility] INVASIVE CV LAB;  Service: Cardiovascular;  Laterality: N/A;   COLONOSCOPY N/A 09/16/2015   Procedure: COLONOSCOPY;  Surgeon: Lamar CHRISTELLA Hollingshead, MD;  Location: [facility] ENDO SUITE;  Service: Endoscopy;  Laterality: N/A;  8:15 AM - pt has c pap   ENDOVENOUS ABLATION SAPHENOUS VEIN W/ LASER Right 08/26/2017   endovenous laser ablation right greater saphenous vein by Lynwood Collum MD    INTRAOPERATIVE TRANSTHORACIC ECHOCARDIOGRAM N/A 06/25/2023   Procedure: INTRAOPERATIVE TRANSTHORACIC ECHOCARDIOGRAM;  Surgeon: Wonda Sharper, MD;  Location: [facility]  INVASIVE CV LAB;  Service: Open Heart Surgery;  Laterality: N/A;   PACEMAKER IMPLANT N/A 09/14/2022   Procedure: PACEMAKER IMPLANT;  Surgeon: Fernande Elspeth BROCKS, MD;  Location: [facility] INVASIVE CV LAB;  Service: Cardiovascular;  Laterality: N/A;   RIGHT/LEFT HEART CATH AND CORONARY ANGIOGRAPHY N/A 02/11/2023   Procedure: RIGHT/LEFT HEART CATH AND CORONARY ANGIOGRAPHY;  Surgeon: Wonda Sharper, MD;  Location: [facility] INVASIVE CV LAB;  Service: Cardiovascular;  Laterality: N/A;   stab phlebectomy  Right 01/07/2018   stab phlebectomy 10-20 incisions right leg by Lynwood Collum MD    TRANSCATHETER AORTIC VALVE REPLACEMENT, TRANSFEMORAL Right 06/25/2023   Procedure: Transcatheter Aortic Valve Replacement, Transfemoral;  Surgeon: Wonda Sharper, MD;  Location: [facility] INVASIVE CV LAB;  Service: Open Heart Surgery;  Laterality: Right;   Family History  Problem Relation Age of Onset   Colon cancer Mother    Diabetes Mother    Heart disease Mother    Diabetes Sister    Diabetes Sister    Diabetes Sister    Social History   Occupational History   Occupation: Self Employed  Tobacco Use   Smoking status: Some Days    Current packs/day: 1.50    Average packs/day: 1.5 packs/day for 66.3 years (99.5 ttl pk-yrs)    Types: Cigarettes    Start date: 04/04/1958   Smokeless tobacco: Never   Tobacco comments:    Quit smoking day of DCCV 11/07/22    Started back due to stress - 1/2 pack per day - trying  to cut back down. - 12/17/2022  Vaping Use   Vaping status: Never Used  Substance and Sexual Activity   Alcohol use: Yes    Alcohol/week: 1.0 standard drink of alcohol    Types: 1 Cans of beer per week    Comment: 1-2  beer once a month 10/12/22   Drug use: No   Sexual activity: Not Currently    Comment: divorced, 2 daughters.  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   Tobacco Counseling Ready to quit: Not Answered Counseling given: Not Answered Tobacco comments: Quit smoking day of DCCV 11/07/22 Started back  due to stress - 1/2 pack per day - trying to cut back down. - 12/17/2022  SDOH Screenings   Food Insecurity: No Food Insecurity (07/30/2024)  Housing: Low Risk  (07/30/2024)  Transportation Needs: Unknown (07/30/2024)  Utilities: Not At Risk (07/30/2024)  Alcohol Screen: Low Risk  (09/19/2023)  Depression (PHQ2-9): Low Risk  (07/30/2024)  Financial Resource Strain: Low Risk  (12/30/2023)  Physical Activity: Inactive (07/30/2024)  Social Connections: Moderately Isolated (07/30/2024)  Stress: No Stress Concern Present (07/30/2024)  Tobacco Use: High Risk (07/30/2024)  Health Literacy: Adequate Health Literacy (07/30/2024)   Depression Screen    07/30/2024    8:22 AM 10/03/2023   11:42 AM 09/19/2023    9:27 AM 12/07/2022    8:36 AM 11/01/2022    4:37 PM 11/01/2022    1:56 PM 09/28/2022    3:07 PM  PHQ 2/9 Scores  PHQ - 2 Score 0 0 0 0 0 0 0  PHQ- 9 Score 0 0 0         Goals Addressed             This Visit's Progress    Patient Stated       Continue current lifestyle       Visit info / Clinical Intake: Medicare Wellness Visit Mode:: Telephone If telephone:: video declined Interpreter Needed?: No Pre-visit prep was completed: no AWV questionnaire completed by patient prior to visit?: no Patient's Overall Health Status Rating: good Typical amount of pain: none Does pain affect daily life?: no Are you currently prescribed opioids?: no  Dietary Habits and Nutritional Risks How many meals a day?: 2 Most meals are obtained by: preparing own meals; eating out Diabetic:: (!) yes Any non-healing wounds?: no How often do you check your BS?: continuous glucose monitor Would you like to be referred to a Nutritionist or for Diabetic Management? : no  Functional Status Activities of Daily Living (to include ambulation/medication): Independent Ambulation: Independent Medication Administration: Independent Home Management: Independent Manage your own finances?: yes Primary transportation  is: driving Concerns about vision?: no *vision screening is required for WTM* Concerns about hearing?: no  Fall Screening Falls in the past year?: 0 Number of falls in past year: 0 Was there an injury with Fall?: 0 Fall Risk Category Calculator: 0 Patient Fall Risk Level: Low Fall Risk  Fall Risk Patient at Risk for Falls Due to: No Fall Risks Fall risk Follow up: Falls evaluation completed; Education provided; Falls prevention discussed  Home and Transportation Safety: All rugs have non-skid backing?: (!) no All stairs or steps have railings?: yes Grab bars in the bathtub or shower?: yes Have non-skid surface in bathtub or shower?: (!) no Good home lighting?: yes Regular seat belt use?: yes Hospital stays in the last year:: no  Cognitive Assessment Difficulty concentrating, remembering, or making decisions? : no Will 6CIT or Mini Cog be Completed: yes What  year is it?: 0 points What month is it?: 0 points Give patient an address phrase to remember (5 components): Its very sunny outside today in November About what time is it?: 0 points Count backwards from 20 to 1: 0 points Say the months of the year in reverse: 0 points Repeat the address phrase from earlier: 0 points 6 CIT Score: 0 points  Advance Directives (For Healthcare) Does Patient Have a Medical Advance Directive?: No Would patient like information on creating a medical advance directive?: No - Patient declined  Reviewed/Updated  Reviewed/Updated: All        Objective:    There were no vitals filed for this visit. There is no height or weight on file to calculate BMI.  Current Medications (verified) Outpatient Encounter Medications as of 07/30/2024  Medication Sig   Accu-Chek FastClix Lancets MISC Check BS QAM - DX: E11.9   albuterol  (VENTOLIN  HFA) 108 (90 Base) MCG/ACT inhaler Inhale 2 puffs into the lungs every 6 (six) hours as needed for wheezing or shortness of breath.   apixaban  (ELIQUIS ) 5 MG  TABS tablet Take 1 tablet (5 mg total) by mouth 2 (two) times daily.   atorvastatin  (LIPITOR) 40 MG tablet TAKE 1 TABLET BY MOUTH DAILY   bisoprolol  (ZEBETA ) 5 MG tablet TAKE 1 TABLET BY MOUTH DAILY   Blood Glucose Monitoring Suppl (ONE TOUCH ULTRA 2) w/Device KIT USETO CHECK BLOOD SUGAR EVERY MORNING   budesonide -formoterol  (SYMBICORT ) 160-4.5 MCG/ACT inhaler Inhale 2 puffs into the lungs in the morning and at bedtime.   Continuous Blood Gluc Receiver (FREESTYLE LIBRE 2 READER) DEVI For continuous blood glucose monitoring   Continuous Glucose Sensor (FREESTYLE LIBRE 2 SENSOR) MISC APPLY 1 SENSOR ON THE SKIN EVERY 14 DAYS, USE AS DIRECTED TO CHECK BLOOD SUGAR   furosemide  (LASIX ) 40 MG tablet TAKE 2 TABLETS BY MOUTH TWICE DAILY   ipratropium-albuterol  (DUONEB) 0.5-2.5 (3) MG/3ML SOLN Take 3 mLs by nebulization 3 (three) times daily.   JARDIANCE  25 MG TABS tablet TAKE 1 TABLET BY MOUTH DAILY BEFORE BREAKFAST   Lancets Misc. (ACCU-CHEK FASTCLIX LANCET) KIT Check BS QAM - DX: E11.9   LANTUS  100 UNIT/ML injection INJECT 25 UNITS UNDER THE SKIN DAILY. INCREASE by 1 unit DAILY UNTIL fasting glucose is <130, THEN hold AT that DOSE. Discard AFTER 28 DAYS   metFORMIN  (GLUCOPHAGE ) 1000 MG tablet TAKE 1 TABLET BY MOUTH TWICE DAILY WITH MEALS   Multiple Vitamin (MULTIVITAMIN) capsule Take 1 capsule by mouth in the morning. Mens one a day   ONETOUCH ULTRA test strip USE TO CHECK BLOOD SUGAR EVERY MORNING   OZEMPIC , 2 MG/DOSE, 8 MG/3ML SOPN INJECT 2 MG into THE SKIN ONCE WEEKLY   potassium chloride  SA (KLOR-CON  M) 20 MEQ tablet TAKE 1 TABLET BY MOUTH TWICE DAILY   spironolactone  (ALDACTONE ) 25 MG tablet TAKE 1 TABLET BY MOUTH DAILY   Zinc 30 MG CAPS Take 30 mg by mouth daily.   Cholecalciferol (VITAMIN D-3) 125 MCG (5000 UT) TABS Take 5,000 Units by mouth 2 (two) times daily.   No facility-administered encounter medications on file as of 07/30/2024.   Hearing/Vision screen Hearing Screening - Comments:: No  trouble hearing Vision Screening - Comments:: Up to date Unsure of name Immunizations and Health Maintenance Health Maintenance  Topic Date Due   Zoster Vaccines- Shingrix (1 of 2) Never done   OPHTHALMOLOGY EXAM  11/29/2021   FOOT EXAM  10/23/2023   Lung Cancer Screening  01/21/2024   HEMOGLOBIN A1C  06/30/2024  COVID-19 Vaccine (1) 08/15/2024 (Originally 04/04/1956)   Pneumococcal Vaccine: 50+ Years (1 of 2 - PCV) 09/18/2024 (Originally 04/04/1970)   Influenza Vaccine  12/22/2024 (Originally 04/24/2024)   Diabetic kidney evaluation - Urine ACR  10/02/2024   Diabetic kidney evaluation - eGFR measurement  12/29/2024   Medicare Annual Wellness (AWV)  07/30/2025   DTaP/Tdap/Td (3 - Td or Tdap) 02/02/2029   Colonoscopy  12/10/2032   Hepatitis C Screening  Completed   Meningococcal B Vaccine  Aged Out        Assessment/Plan:  This is a routine wellness examination for Chijioke.  Patient Care Team: Duanne Butler DASEN, MD as PCP - General (Family Medicine) Mallipeddi, Diannah SQUIBB, MD as PCP - Cardiology (Cardiology) [facility] as Consulting Physician Darlean Ozell NOVAK, MD as Consulting Physician (Pulmonary Disease)  I have personally reviewed and noted the following in the patient's chart:   Medical and social history Use of alcohol, tobacco or illicit drugs  Current medications and supplements including opioid prescriptions. Functional ability and status Nutritional status Physical activity Advanced directives List of other physicians Hospitalizations, surgeries, and ER visits in previous 12 months Vitals Screenings to include cognitive, depression, and falls Referrals and appointments  No orders of the defined types were placed in this encounter.  In addition, I have reviewed and discussed with patient certain preventive protocols, quality metrics, and best practice recommendations. A written personalized care plan for preventive services as well as  general preventive health recommendations were provided to patient.   Mliss Graff, LPN   [id number]/11/[id number]   Return in 1 year (on 07/30/2025).  After Visit Summary: (MyChart) Due to this being a telephonic visit, the after visit summary with patients personalized plan was offered to patient via MyChart   Nurse Notes:

## 2024-07-31 ENCOUNTER — Other Ambulatory Visit: Payer: Self-pay | Admitting: Cardiovascular Disease

## 2024-07-31 ENCOUNTER — Other Ambulatory Visit: Payer: Self-pay | Admitting: Family Medicine

## 2024-07-31 DIAGNOSIS — E1100 Type 2 diabetes mellitus with hyperosmolarity without nonketotic hyperglycemic-hyperosmolar coma (NKHHC): Secondary | ICD-10-CM

## 2024-08-01 NOTE — Telephone Encounter (Signed)
 Requested Prescriptions  Pending Prescriptions Disp Refills   LANTUS  100 UNIT/ML injection [Pharmacy Med Name: Lantus  U-100 Insulin  100 unit/mL subcutaneous solution] 30 mL 0    Sig: INJECT 25 UNITS UNDER THE SKIN DAILY. INCREASE by 1 unit DAILY UNTIL fasting glucose is <130, THEN hold AT that DOSE. Discard AFTER 28 DAYS     Endocrinology:  Diabetes - Insulins Failed - 08/01/2024 10:48 AM      Failed - HBA1C is between 0 and 7.9 and within 180 days    Hgb A1c MFr Bld  Date Value Ref Range Status  12/30/2023 8.3 (H) <5.7 % of total Hgb Final    Comment:    For someone without known diabetes, a hemoglobin A1c value of 6.5% or greater indicates that they may have  diabetes and this should be confirmed with a follow-up  test. . For someone with known diabetes, a value <7% indicates  that their diabetes is well controlled and a value  greater than or equal to 7% indicates suboptimal  control. A1c targets should be individualized based on  duration of diabetes, age, comorbid conditions, and  other considerations. . Currently, no consensus exists regarding use of hemoglobin A1c for diagnosis of diabetes for children. SABRA Amy - Valid encounter within last 6 months    Recent Outpatient Visits           7 months ago Uncontrolled type 2 diabetes mellitus with hyperosmolarity without coma, without long-term current use of insulin  [facility])   [place] [facility] Duanne Butler DASEN, MD   10 months ago Uncontrolled type 2 diabetes mellitus with hyperosmolarity without coma, without long-term current use of insulin  [facility])   [facility] Family Medicine Duanne Butler DASEN, MD   1 year ago Uncontrolled type 2 diabetes mellitus with hyperosmolarity without coma, without long-term current use of insulin  [facility])   [place] [facility] Medicine Duanne Butler DASEN, MD   1 year ago Acute heart failure with preserved ejection fraction [facility])   [place]  [facility] Family Medicine Duanne Butler DASEN, MD   1 year ago Cellulitis of right lower extremity   [place] [facility] Family Medicine Pickard, Butler DASEN, MD

## 2024-08-31 ENCOUNTER — Other Ambulatory Visit: Payer: Self-pay | Admitting: Family Medicine

## 2024-08-31 DIAGNOSIS — E1100 Type 2 diabetes mellitus with hyperosmolarity without nonketotic hyperglycemic-hyperosmolar coma (NKHHC): Secondary | ICD-10-CM

## 2024-09-03 ENCOUNTER — Other Ambulatory Visit (HOSPITAL_COMMUNITY): Payer: Self-pay

## 2024-09-03 ENCOUNTER — Telehealth: Payer: Self-pay | Admitting: Pharmacy Technician

## 2024-09-03 NOTE — Telephone Encounter (Signed)
 Pharmacy Patient Advocate Encounter   Received notification from Onbase that prior authorization for Ozempic  (2 mg/dose) 8mg /3ml is required/requested.   Insurance verification completed.   The patient is insured through [employer].   Per test claim: The current 84 day co-pay is, $0.00.  No PA needed at this time. This test claim was processed through [facility]- copay amounts may vary at other pharmacies due to pharmacy/plan contracts, or as the patient moves through the different stages of their insurance plan.

## 2024-09-14 ENCOUNTER — Encounter: Payer: Self-pay | Admitting: Cardiovascular Disease

## 2024-09-14 ENCOUNTER — Ambulatory Visit: Attending: Cardiovascular Disease | Admitting: Cardiovascular Disease

## 2024-09-14 VITALS — BP 130/68 | HR 65 | Ht 73.0 in | Wt 261.0 lb

## 2024-09-14 DIAGNOSIS — I4819 Other persistent atrial fibrillation: Secondary | ICD-10-CM

## 2024-09-14 DIAGNOSIS — Z79899 Other long term (current) drug therapy: Secondary | ICD-10-CM

## 2024-09-14 DIAGNOSIS — Z5181 Encounter for therapeutic drug level monitoring: Secondary | ICD-10-CM | POA: Diagnosis not present

## 2024-09-14 LAB — CUP PACEART INCLINIC DEVICE CHECK
Date Time Interrogation Session: 20251222151812
Implantable Lead Connection Status: 753985
Implantable Lead Connection Status: 753985
Implantable Lead Implant Date: 20231222
Implantable Lead Implant Date: 20231222
Implantable Lead Location: 753859
Implantable Lead Location: 753860
Implantable Lead Model: 3830
Implantable Lead Model: 5076
Implantable Pulse Generator Implant Date: 20231222

## 2024-09-14 NOTE — Patient Instructions (Signed)
 Medication Instructions:  Your physician recommends that you continue on your current medications as directed. Please refer to the Current Medication list given to you today.  *If you need a refill on your cardiac medications before your next appointment, please call your pharmacy*  Follow-Up: At [facility], you and your health needs are our priority.  As part of our continuing mission to provide you with exceptional heart care, our providers are all part of one team.  This team includes your primary Cardiologist (physician) and Advanced Practice Providers or APPs (Physician Assistants and Nurse Practitioners) who all work together to provide you with the care you need, when you need it.  Your next appointment:   1 year  Provider:   You will see one of the following Advanced Practice Providers on your designated Care Team:   Charlies Arthur, [place] Ozell Jodie Passey, PA-C Suzann Riddle, NP Daphne Barrack, NP Artist Pouch, PA-C    We recommend signing up for the patient portal called MyChart.  Sign up information is provided on this After Visit Summary.  MyChart is used to connect with patients for Virtual Visits (Telemedicine).  Patients are able to view lab/test results, encounter notes, upcoming appointments, etc.  Non-urgent messages can be sent to your provider as well.   To learn more about what you can do with MyChart, go to [email or web address].

## 2024-09-14 NOTE — Progress Notes (Signed)
" °  Electrophysiology Office Note:    Date:  09/14/2024   ID:  Steven Henry, DOB Jun 29, 1951, MRN [id number]  PCP:  Duanne Butler DASEN, MD   [facility] Providers Cardiologist:  Diannah SHAUNNA Maywood, MD Electrophysiologist:  Eulas FORBES Furbish, MD     Referring MD: Duanne Butler DASEN, MD   History of Present Illness:    Steven Henry is a 73 y.o. male with a medical history significant for persistent atrial fibrillation, high-grade AV block, Medtronic pacemaker placed in December 2023, severe aortic valve stenosis status post TAVR October 2024, obstructive sleep apnea not on CPAP, hypertension, diabetes referred for device and rhythm management.       History of Present Illness  [place] with heart failure symptoms and found to be in atrial fibrillation.  He was again admitted to [facility] with similar symptoms in June 2023.  A ZIO monitor was placed that showed several pauses resulting in insertion of a dual-chamber pacemaker by Dr. Fernande in December 2023.  He had recurrence of persistent atrial fibrillation and was started on flecainide  by Dr. Fernande in April 2024 and underwent DC cardioversion January 09, 2023         Today, he reports that he feels well and has no complaints.  No syncope, presyncope.  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  EKGs/Labs/Other Studies Reviewed Today:     Echocardiogram:  TTE September 2025 LVEF 55 to 60%.  Moderate biatrial enlargement.  Moderate left ventricular hypertrophy.  Edwards SAPIEN 3 aortic valve.     EKG:         Physical Exam:    VS:  BP 130/68 (BP Location: Left Arm, Patient Position: Sitting, Cuff Size: Large)   Pulse 65   Ht 6' 1 (1.854 m)   Wt 261 lb (118.4 kg)   SpO2 96%   BMI 34.43 kg/m     Wt Readings from Last 3 Encounters:  09/14/24 261 lb (118.4 kg)  07/30/24 268 lb (121.6 kg)  06/17/24 268 lb 9.6 oz (121.8 kg)      GEN: Well nourished, well developed in no acute distress CARDIAC: RRR, no murmurs, rubs, gallops The device site is normal -- no tenderness, edema, drainage, redness, threatened erosion.  RESPIRATORY:  Normal work of breathing MUSCULOSKELETAL: no edema    ASSESSMENT & PLAN:     History of high-grade AV block Status post Medtronic dual-chamber pacemaker I reviewed today's pacemaker interrogation in detail.  See Paceart for report He appears to have intact sinus function.  We disabled rate response. MVP turned off --he has complete heart block  Atrial fibrillation He has been maintained on flecainide  Flecainide  was discontinued due to the history of aortic valve replacement, LVH and elevated coronary calcium  score  Monitor for recurrence of A-fib In the event of recurrence, we will plan to discuss ablation versus try Tikosyn.  Recurrent hospitalization for CHFpEF Oftentimes with exacerbation by atrial fibrillation Continue Jardiance  25, Aldactone  25 mg, Lasix  40 mg   Signed, Eulas FORBES Furbish, MD  09/14/2024 3:03 PM    [facility] "

## 2024-09-15 ENCOUNTER — Ambulatory Visit: Payer: Medicare Other

## 2024-09-15 DIAGNOSIS — I4819 Other persistent atrial fibrillation: Secondary | ICD-10-CM | POA: Diagnosis not present

## 2024-09-16 LAB — CUP PACEART REMOTE DEVICE CHECK
Battery Remaining Longevity: 128 mo
Battery Voltage: 3.02 V
Brady Statistic AP VP Percent: 47.99 %
Brady Statistic AP VS Percent: 0 %
Brady Statistic AS VP Percent: 52 %
Brady Statistic AS VS Percent: 0.01 %
Brady Statistic RA Percent Paced: 47.69 %
Brady Statistic RV Percent Paced: 99.99 %
Date Time Interrogation Session: 20251223034622
Implantable Lead Connection Status: 753985
Implantable Lead Connection Status: 753985
Implantable Lead Implant Date: 20231222
Implantable Lead Implant Date: 20231222
Implantable Lead Location: 753859
Implantable Lead Location: 753860
Implantable Lead Model: 3830
Implantable Lead Model: 5076
Implantable Pulse Generator Implant Date: 20231222
Lead Channel Impedance Value: 380 Ohm
Lead Channel Impedance Value: 418 Ohm
Lead Channel Impedance Value: 456 Ohm
Lead Channel Impedance Value: 570 Ohm
Lead Channel Pacing Threshold Amplitude: 0.625 V
Lead Channel Pacing Threshold Amplitude: 0.875 V
Lead Channel Pacing Threshold Pulse Width: 0.4 ms
Lead Channel Pacing Threshold Pulse Width: 0.4 ms
Lead Channel Sensing Intrinsic Amplitude: 16.25 mV
Lead Channel Sensing Intrinsic Amplitude: 16.25 mV
Lead Channel Sensing Intrinsic Amplitude: 3.625 mV
Lead Channel Sensing Intrinsic Amplitude: 3.625 mV
Lead Channel Setting Pacing Amplitude: 1.5 V
Lead Channel Setting Pacing Amplitude: 2 V
Lead Channel Setting Pacing Pulse Width: 0.4 ms
Lead Channel Setting Sensing Sensitivity: 0.9 mV
Zone Setting Status: 755011

## 2024-09-16 NOTE — Progress Notes (Signed)
 Remote PPM Transmission

## 2024-09-18 ENCOUNTER — Other Ambulatory Visit: Payer: Self-pay | Admitting: Family Medicine

## 2024-09-18 DIAGNOSIS — N183 Chronic kidney disease, stage 3 unspecified: Secondary | ICD-10-CM

## 2024-09-18 DIAGNOSIS — E1100 Type 2 diabetes mellitus with hyperosmolarity without nonketotic hyperglycemic-hyperosmolar coma (NKHHC): Secondary | ICD-10-CM

## 2024-09-23 ENCOUNTER — Ambulatory Visit: Payer: Self-pay | Admitting: Cardiovascular Disease

## 2024-09-28 DIAGNOSIS — E1100 Type 2 diabetes mellitus with hyperosmolarity without nonketotic hyperglycemic-hyperosmolar coma (NKHHC): Secondary | ICD-10-CM

## 2024-09-28 DIAGNOSIS — N183 Chronic kidney disease, stage 3 unspecified: Secondary | ICD-10-CM

## 2024-10-14 ENCOUNTER — Ambulatory Visit: Attending: Family Medicine

## 2024-10-18 ENCOUNTER — Other Ambulatory Visit: Payer: Self-pay | Admitting: Internal Medicine

## 2024-10-18 ENCOUNTER — Other Ambulatory Visit: Payer: Self-pay | Admitting: Family Medicine

## 2024-10-18 DIAGNOSIS — I480 Paroxysmal atrial fibrillation: Secondary | ICD-10-CM

## 2024-10-19 NOTE — Telephone Encounter (Signed)
 Requested Prescriptions  Pending Prescriptions Disp Refills   ELIQUIS  5 MG TABS tablet [Pharmacy Med Name: Eliquis  5 mg tablet] 180 tablet 0    Sig: TAKE 1 TABLET BY MOUTH TWICE DAILY     Hematology:  Anticoagulants - apixaban  Failed - 10/19/2024  2:28 PM      Failed - Cr in normal range and within 360 days    Creat  Date Value Ref Range Status  12/30/2023 1.35 (H) 0.70 - 1.28 mg/dL Final   Creatinine, Urine  Date Value Ref Range Status  10/03/2023 34 20 - 320 mg/dL Final         Passed - PLT in normal range and within 360 days    Platelets  Date Value Ref Range Status  12/30/2023 193 140 - 400 Thousand/uL Final         Passed - HGB in normal range and within 360 days    Hemoglobin  Date Value Ref Range Status  12/30/2023 16.3 13.2 - 17.1 g/dL Final         Passed - HCT in normal range and within 360 days    HCT  Date Value Ref Range Status  12/30/2023 49.1 38.5 - 50.0 % Final         Passed - AST in normal range and within 360 days    AST  Date Value Ref Range Status  12/30/2023 16 10 - 35 U/L Final         Passed - ALT in normal range and within 360 days    ALT  Date Value Ref Range Status  12/30/2023 9 9 - 46 U/L Final         Passed - Valid encounter within last 12 months    Recent Outpatient Visits           9 months ago Uncontrolled type 2 diabetes mellitus with hyperosmolarity without coma, without long-term current use of insulin  (HCC)   [place] [facility] Medicine Duanne Butler DASEN, MD   1 year ago Uncontrolled type 2 diabetes mellitus with hyperosmolarity without coma, without long-term current use of insulin  [facility])   [place] [facility] Duanne Butler DASEN, MD   1 year ago Uncontrolled type 2 diabetes mellitus with hyperosmolarity without coma, without long-term current use of insulin  [facility])   [place] [facility] Medicine Duanne Butler DASEN, MD   1 year ago Acute heart failure with preserved ejection fraction  [facility])   [place] [facility] Family Medicine Duanne Butler DASEN, MD   2 years ago Cellulitis of right lower extremity   [place] [facility] Medicine Pickard, Butler DASEN, MD

## 2024-12-15 ENCOUNTER — Ambulatory Visit

## 2025-03-16 ENCOUNTER — Ambulatory Visit

## 2025-06-15 ENCOUNTER — Other Ambulatory Visit

## 2025-06-15 ENCOUNTER — Ambulatory Visit

## 2025-08-05 ENCOUNTER — Ambulatory Visit

## 2025-09-14 ENCOUNTER — Ambulatory Visit
